# Patient Record
Sex: Female | Born: 1960
Health system: Southern US, Community
[De-identification: ages and names within clinical notes are randomized; demographics above are authoritative.]

## PROBLEM LIST (undated history)

## (undated) DIAGNOSIS — I739 Peripheral vascular disease, unspecified: Secondary | ICD-10-CM

## (undated) DIAGNOSIS — R0600 Dyspnea, unspecified: Secondary | ICD-10-CM

## (undated) DIAGNOSIS — K219 Gastro-esophageal reflux disease without esophagitis: Secondary | ICD-10-CM

## (undated) DIAGNOSIS — E785 Hyperlipidemia, unspecified: Secondary | ICD-10-CM

## (undated) DIAGNOSIS — I725 Aneurysm of other precerebral arteries: Secondary | ICD-10-CM

## (undated) DIAGNOSIS — J439 Emphysema, unspecified: Secondary | ICD-10-CM

## (undated) DIAGNOSIS — I10 Essential (primary) hypertension: Secondary | ICD-10-CM

## (undated) DIAGNOSIS — E041 Nontoxic single thyroid nodule: Secondary | ICD-10-CM

## (undated) DIAGNOSIS — F419 Anxiety disorder, unspecified: Secondary | ICD-10-CM

## (undated) HISTORY — DX: Peripheral vascular disease, unspecified: I73.9

## (undated) HISTORY — PX: NASAL SINUS SURGERY: SHX719

## (undated) HISTORY — DX: Nontoxic single thyroid nodule: E04.1

## (undated) HISTORY — DX: Emphysema, unspecified: J43.9

## (undated) HISTORY — PX: VASCULAR SURGERY: SHX849

## (undated) HISTORY — PX: TONSILLECTOMY: SUR1361

## (undated) HISTORY — PX: TUBAL LIGATION: SHX77

## (undated) HISTORY — DX: Essential (primary) hypertension: I10

---

## 2003-02-27 HISTORY — PX: ENDOMETRIAL BIOPSY: SHX622

## 2006-02-26 HISTORY — PX: FEMORAL ARTERY STENT: SHX1583

## 2006-09-25 DIAGNOSIS — I739 Peripheral vascular disease, unspecified: Secondary | ICD-10-CM | POA: Insufficient documentation

## 2006-09-25 DIAGNOSIS — K219 Gastro-esophageal reflux disease without esophagitis: Secondary | ICD-10-CM | POA: Insufficient documentation

## 2009-12-16 ENCOUNTER — Ambulatory Visit: Payer: Self-pay | Admitting: Emergency Medicine

## 2009-12-16 DIAGNOSIS — E785 Hyperlipidemia, unspecified: Secondary | ICD-10-CM | POA: Insufficient documentation

## 2010-03-28 NOTE — Assessment & Plan Note (Signed)
Summary: SORE THROAT,FEVER,EAR PAIN, COUGH/TJ   Vital Signs:  Patient Profile:   50 Years Old Female CC:      Cold & URI symptoms x 3 days Height:     65 inches Weight:      165 pounds O2 Sat:      98 % O2 treatment:    Room Air Temp:     98.5 degrees F oral Pulse rate:   86 / minute Resp:     14 per minute BP sitting:   138 / 84  (left arm) Cuff size:   regular  Vitals Entered By: Lajean Saver RN (December 16, 2009 4:22 PM)                  Updated Prior Medication List: LIPITOR 40 MG TABS (ATORVASTATIN CALCIUM) once daily OMEPRAZOLE 20 MG CPDR (OMEPRAZOLE) once daily METOPROLOL SUCCINATE 50 MG XR24H-TAB (METOPROLOL SUCCINATE) once daily  Current Allergies: ! * OLIVESHistory of Present Illness History from: patient Chief Complaint: Cold & URI symptoms x 3 days History of Present Illness: Patient complains of onset of cold symptoms for 3-4 days.  They have been using Zyrtec & Nyquil which is helping a little bit.  She was just on a trip to [facility]. + sore throat + cough No pleuritic pain No wheezing + nasal congestion + post-nasal drainage + sinus pain/pressure No itchy/red eyes + ear ringing No hemoptysis No SOB No chills/sweats No fever No nausea No vomiting No abdominal pain No diarrhea No skin rashes No fatigue No myalgias + headache   REVIEW OF SYSTEMS Constitutional Symptoms      Denies fever, chills, night sweats, weight loss, weight gain, and fatigue.  Eyes       Denies change in vision, eye pain, eye discharge, glasses, contact lenses, and eye surgery. Ear/Nose/Throat/Mouth       Complains of sore throat.      Denies hearing loss/aids, change in hearing, ear pain, ear discharge, dizziness, frequent runny nose, frequent nose bleeds, sinus problems, hoarseness, and tooth pain or bleeding.  Respiratory       Complains of productive cough.      Denies dry cough, wheezing, shortness of breath, asthma, bronchitis, and emphysema/COPD.    Cardiovascular       Denies murmurs, chest pain, and tires easily with exhertion.    Gastrointestinal       Complains of nausea/vomiting.      Denies stomach pain, diarrhea, constipation, blood in bowel movements, and indigestion. Genitourniary       Denies painful urination, kidney stones, and loss of urinary control. Neurological       Complains of headaches.      Denies paralysis, seizures, and fainting/blackouts. Musculoskeletal       Denies muscle pain, joint pain, joint stiffness, decreased range of motion, redness, swelling, muscle weakness, and gout.  Skin       Denies bruising, unusual mles/lumps or sores, and hair/skin or nail changes.  Psych       Denies mood changes, temper/anger issues, anxiety/stress, speech problems, depression, and sleep problems.  Past History:  Past Medical History: Hyperlipidemia PAD  Past Surgical History: Tonsillectomy Tubal ligation abdominal stents  Family History: PAD  Social History: Current Smoker Alcohol use-no Drug use-no Smoking Status:  current Drug Use:  no Physical Exam General appearance: well developed, well nourished, no acute distress Head: normocephalic, atraumatic Ears: normal, no lesions or deformities Nasal: clear discharge Oral/Pharynx: pharyngeal erythema without exudate, uvula midline without  deviation Chest/Lungs: no rales, wheezes, or rhonchi bilateral, breath sounds equal without effort Heart: regular rate and  rhythm, no murmur Skin: no obvious rashes or lesions MSE: oriented to time, place, and person Assessment New Problems: UPPER RESPIRATORY INFECTION, ACUTE (ICD-465.9) HYPERLIPIDEMIA (ICD-272.4)   Patient Education: Patient and/or caregiver instructed in the following: rest, fluids, Tylenol prn, Ibuprofen prn.  Plan New Medications/Changes: ZITHROMAX Z-PAK 250 MG TABS (AZITHROMYCIN) use as directed  #1 x 0, 12/16/2009, Hoyt Koch MD CHERATUSSIN AC 100-10 MG/5ML SYRP  (GUAIFENESIN-CODEINE) 5-10 cc by mouth Q6 hours as needed for cough  #6oz x 0, 12/16/2009, Hoyt Koch MD  New Orders: New Patient Level II [phone] Planning Comments:   Take Sudafed + cough meds + ZYrtec for a few days If getting worse symptoms, can start the antibiotic Follow-up with your primary care physician if not improving or if getting worse   The patient and/or caregiver has been counseled thoroughly with regard to medications prescribed including dosage, schedule, interactions, rationale for use, and possible side effects and they verbalize understanding.  Diagnoses and expected course of recovery discussed and will return if not improved as expected or if the condition worsens. Patient and/or caregiver verbalized understanding.  Prescriptions: ZITHROMAX Z-PAK 250 MG TABS (AZITHROMYCIN) use as directed  #1 x 0   Entered and Authorized by:   Hoyt Koch MD   Signed by:   Hoyt Koch MD on 12/16/2009   Method used:   Print then Give to Patient   RxID:   [id number] CHERATUSSIN AC 100-10 MG/5ML SYRP (GUAIFENESIN-CODEINE) 5-10 cc by mouth Q6 hours as needed for cough  #6oz x 0   Entered and Authorized by:   Hoyt Koch MD   Signed by:   Hoyt Koch MD on 12/16/2009   Method used:   Print then Give to Patient   RxID:   [id number]   Orders Added: 1)  New Patient Level II [01027]

## 2011-07-28 DIAGNOSIS — E785 Hyperlipidemia, unspecified: Secondary | ICD-10-CM | POA: Insufficient documentation

## 2011-07-28 DIAGNOSIS — K5900 Constipation, unspecified: Secondary | ICD-10-CM | POA: Insufficient documentation

## 2011-12-19 DIAGNOSIS — E042 Nontoxic multinodular goiter: Secondary | ICD-10-CM | POA: Insufficient documentation

## 2012-07-28 DIAGNOSIS — Z720 Tobacco use: Secondary | ICD-10-CM | POA: Insufficient documentation

## 2014-05-17 ENCOUNTER — Encounter: Payer: Self-pay | Admitting: Emergency Medicine

## 2014-05-17 ENCOUNTER — Emergency Department
Admission: EM | Admit: 2014-05-17 | Discharge: 2014-05-17 | Disposition: A | Discharge status: Home / Self Care | Payer: Federal, State, Local not specified - PPO | Source: Home / Self Care | Attending: Family Medicine | Admitting: Family Medicine

## 2014-05-17 DIAGNOSIS — J069 Acute upper respiratory infection, unspecified: Secondary | ICD-10-CM | POA: Diagnosis not present

## 2014-05-17 DIAGNOSIS — B9789 Other viral agents as the cause of diseases classified elsewhere: Principal | ICD-10-CM

## 2014-05-17 DIAGNOSIS — J9801 Acute bronchospasm: Secondary | ICD-10-CM | POA: Diagnosis not present

## 2014-05-17 HISTORY — DX: Gastro-esophageal reflux disease without esophagitis: K21.9

## 2014-05-17 HISTORY — DX: Hyperlipidemia, unspecified: E78.5

## 2014-05-17 MED ORDER — AZITHROMYCIN 250 MG PO TABS: ORAL_TABLET | ORAL | Status: DC

## 2014-05-17 MED ORDER — PREDNISONE 20 MG PO TABS: 20.0000 mg | ORAL_TABLET | Freq: Two times a day (BID) | ORAL | Status: DC

## 2014-05-17 MED ORDER — GUAIFENESIN-CODEINE 100-10 MG/5ML PO SOLN: ORAL | Status: DC

## 2014-05-17 NOTE — ED Provider Notes (Signed)
CSN: [id number]     Arrival date & time 05/17/14  1130 History   First MD Initiated Contact with Patient 05/17/14 1156     Chief Complaint  Patient presents with  . Cough     HPI Comments: Patient complains of five day history of typical cold-like symptoms including mild sore throat, sinus congestion, fatigue, and partly productive cough.  She has developed a sensation of tightness in her anterior chest, and wheezing and shortness of breath with activity.  Her cough is worse at night.  She continues to smoke.  She has a distant past history of pneumonia.  The history is provided by the patient.    Past Medical History  Diagnosis Date  . GERD (gastroesophageal reflux disease)   . Hyperlipidemia    Past Surgical History  Procedure Laterality Date  . Tonsillectomy    . Femoral stents     No family history on file. History  Substance Use Topics  . Smoking status: Current Every Day Smoker -- 1.00 packs/day for 37 years  . Smokeless tobacco: Not on file  . Alcohol Use: Yes   OB History    No data available     Review of Systems + sore throat + cough No pleuritic pain, but has tightness in anterior chest + wheezing + nasal congestion + post-nasal drainage No sinus pain/pressure No itchy/red eyes No earache No hemoptysis + SOB with activity No fever/chills No nausea No vomiting No abdominal pain No diarrhea No urinary symptoms No skin rash + fatigue No myalgias No headache Used OTC meds without relief  Allergies  Review of patient's allergies indicates not on file.  Home Medications   Prior to Admission medications   Medication Sig Start Date End Date Taking? Authorizing Provider  atorvastatin (LIPITOR) 10 MG tablet Take 10 mg by mouth daily.   Yes Historical Provider, MD  omeprazole (PRILOSEC) 10 MG capsule Take 10 mg by mouth daily.   Yes Historical Provider, MD  azithromycin (ZITHROMAX Z-PAK) 250 MG tablet Take 2 tabs today; then begin one tab once daily for  4 more days. 05/17/14   Lattie HawStephen A Beese, MD  guaiFENesin-codeine 100-10 MG/5ML syrup Take 10mL by mouth at bedtime as needed for cough 05/17/14   Lattie HawStephen A Beese, MD  predniSONE (DELTASONE) 20 MG tablet Take 1 tablet (20 mg total) by mouth 2 (two) times daily. Take with food. 05/17/14   Lattie HawStephen A Beese, MD   BP 139/78 mmHg  Pulse 89  Temp(Src) 97.7 F (36.5 C) (Oral)  Ht 5\' 5"  (1.651 m)  Wt 170 lb (77.111 kg)  BMI 28.29 kg/m2  SpO2 97% Physical Exam Nursing notes and Vital Signs reviewed. Appearance:  Patient appears stated age, and in no acute distress Eyes:  Pupils are equal, round, and reactive to light and accomodation.  Extraocular movement is intact.  Conjunctivae are not inflamed  Ears:  Canals normal.  Tympanic membranes normal.  Nose:  Congested turbinates.  No sinus tenderness.   Pharynx:  Normal Neck:  Supple.  Tender enlarged posterior nodes are palpated bilaterally  Lungs:   Distant breath sounds posteriorly.  Breath sounds are equal.  Chest:  Distinct tenderness to palpation over the mid-sternum.  Heart:  Regular rate and rhythm without murmurs, rubs, or gallops.  Abdomen:  Nontender without masses or hepatosplenomegaly.  Bowel sounds are present.  No CVA or flank tenderness.  Extremities:  No edema.  No calf tenderness Skin:  No rash present.    ED Course  Procedures  none   MDM   1. Viral URI with cough   2. Bronchospasm     Begin Z-pack for atypical coverage, and prednisone burst.  Robitussin AC for night cough. Take plain guaifenesin (  extended release tabs such as Mucinex) twice daily, with plenty of water, for cough and congestion.  May add Pseudoephedrine ( , one or two every 4 to 6 hours) for sinus congestion.  Get adequate rest.   May use Afrin nasal spray (or generic oxymetazoline) twice daily for about 5 days.  Also recommend using saline nasal spray several times daily and saline nasal irrigation (AYR is a common brand).   Try warm salt water  gargles for sore throat.  Stop all antihistamines for now, and other non-prescription cough/cold preparations. May take Ibuprofen , 4 tabs every 8 hours with food for chest/sternum discomfort.   Follow-up with family doctor if not improving about10 days.    Lattie Haw, MD 05/17/14 1228

## 2014-05-17 NOTE — Discharge Instructions (Signed)
Take plain guaifenesin (1200mg  extended release tabs such as Mucinex) twice daily, with plenty of water, for cough and congestion.  May add Pseudoephedrine (30mg , one or two every 4 to 6 hours) for sinus congestion.  Get adequate rest.   May use Afrin nasal spray (or generic oxymetazoline) twice daily for about 5 days.  Also recommend using saline nasal spray several times daily and saline nasal irrigation (AYR is a common brand).   Try warm salt water gargles for sore throat.  Stop all antihistamines for now, and other non-prescription cough/cold preparations. May take Ibuprofen 200mg , 4 tabs every 8 hours with food for chest/sternum discomfort.   Follow-up with family doctor if not improving about10 days.

## 2014-05-17 NOTE — ED Notes (Signed)
Productive cough x 2 days, chest burns

## 2015-03-18 ENCOUNTER — Ambulatory Visit (INDEPENDENT_AMBULATORY_CARE_PROVIDER_SITE_OTHER): Payer: Federal, State, Local not specified - PPO | Admitting: Osteopathic Medicine

## 2015-03-18 ENCOUNTER — Other Ambulatory Visit: Payer: Self-pay

## 2015-03-18 ENCOUNTER — Ambulatory Visit (INDEPENDENT_AMBULATORY_CARE_PROVIDER_SITE_OTHER): Payer: Federal, State, Local not specified - PPO

## 2015-03-18 VITALS — BP 148/71 | HR 90 | Ht 65.0 in | Wt 168.0 lb

## 2015-03-18 DIAGNOSIS — M79641 Pain in right hand: Secondary | ICD-10-CM

## 2015-03-18 DIAGNOSIS — K5900 Constipation, unspecified: Secondary | ICD-10-CM

## 2015-03-18 DIAGNOSIS — Z780 Asymptomatic menopausal state: Secondary | ICD-10-CM

## 2015-03-18 DIAGNOSIS — R5382 Chronic fatigue, unspecified: Secondary | ICD-10-CM

## 2015-03-18 DIAGNOSIS — K219 Gastro-esophageal reflux disease without esophagitis: Secondary | ICD-10-CM | POA: Diagnosis not present

## 2015-03-18 DIAGNOSIS — Z139 Encounter for screening, unspecified: Secondary | ICD-10-CM

## 2015-03-18 DIAGNOSIS — Z8709 Personal history of other diseases of the respiratory system: Secondary | ICD-10-CM

## 2015-03-18 DIAGNOSIS — M6530 Trigger finger, unspecified finger: Secondary | ICD-10-CM

## 2015-03-18 DIAGNOSIS — F17200 Nicotine dependence, unspecified, uncomplicated: Secondary | ICD-10-CM

## 2015-03-18 DIAGNOSIS — Z8639 Personal history of other endocrine, nutritional and metabolic disease: Secondary | ICD-10-CM

## 2015-03-18 DIAGNOSIS — Z8719 Personal history of other diseases of the digestive system: Secondary | ICD-10-CM

## 2015-03-18 DIAGNOSIS — Z79899 Other long term (current) drug therapy: Secondary | ICD-10-CM

## 2015-03-18 DIAGNOSIS — R140 Abdominal distension (gaseous): Secondary | ICD-10-CM | POA: Diagnosis not present

## 2015-03-18 DIAGNOSIS — R531 Weakness: Secondary | ICD-10-CM

## 2015-03-18 LAB — COMPLETE METABOLIC PANEL WITH GFR
ALBUMIN: 4 g/dL (ref 3.6–5.1)
ALK PHOS: 98 U/L (ref 33–130)
ALT: 14 U/L (ref 6–29)
AST: 18 U/L (ref 10–35)
BUN: 13 mg/dL (ref 7–25)
CHLORIDE: 104 mmol/L (ref 98–110)
CO2: 22 mmol/L (ref 20–31)
CREATININE: 0.72 mg/dL (ref 0.50–1.05)
Calcium: 9.1 mg/dL (ref 8.6–10.4)
GFR, Est Non African American: >89 mL/min (ref >=60)
Glucose, Bld: 91 mg/dL (ref 65–99)
POTASSIUM: 4.3 mmol/L (ref 3.5–5.3)
Sodium: 140 mmol/L (ref 135–146)
Total Bilirubin: 0.4 mg/dL (ref 0.2–1.2)
Total Protein: 6.3 g/dL (ref 6.1–8.1)

## 2015-03-18 LAB — CBC WITH DIFFERENTIAL/PLATELET
BASOS PCT: 0 % (ref 0–1)
Basophils Absolute: 0 10*3/uL (ref 0.0–0.1)
Eosinophils Absolute: 0.2 10*3/uL (ref 0.0–0.7)
Eosinophils Relative: 3 % (ref 0–5)
HCT: 43 % (ref 36.0–46.0)
Hemoglobin: 14.3 g/dL (ref 12.0–15.0)
Lymphocytes Relative: 27 % (ref 12–46)
Lymphs Abs: 1.9 10*3/uL (ref 0.7–4.0)
MCH: 29.5 pg (ref 26.0–34.0)
MCHC: 33.3 g/dL (ref 30.0–36.0)
MCV: 88.8 fL (ref 78.0–100.0)
MONO ABS: 0.4 10*3/uL (ref 0.1–1.0)
MPV: 8.8 fL (ref 8.6–12.4)
Monocytes Relative: 6 % (ref 3–12)
NEUTROS ABS: 4.4 10*3/uL (ref 1.7–7.7)
NEUTROS PCT: 64 % (ref 43–77)
Platelets: 329 10*3/uL (ref 150–400)
RBC: 4.84 MIL/uL (ref 3.87–5.11)
RDW: 13.8 % (ref 11.5–15.5)
WBC: 6.9 10*3/uL (ref 4.0–10.5)

## 2015-03-18 LAB — RHEUMATOID FACTOR: Rhuematoid fact SerPl-aCnc: <10 IU/mL (ref <=14)

## 2015-03-18 LAB — LIPID PANEL
CHOLESTEROL: 185 mg/dL (ref 125–200)
HDL: 36 mg/dL — ABNORMAL LOW (ref >=46)
LDL Cholesterol: 100 mg/dL (ref <130)
TRIGLYCERIDES: 244 mg/dL — AB (ref <150)
Total CHOL/HDL Ratio: 5.1 Ratio — ABNORMAL HIGH (ref <=5.0)
VLDL: 49 mg/dL — ABNORMAL HIGH (ref <30)

## 2015-03-18 LAB — CK: Total CK: 76 U/L (ref 7–177)

## 2015-03-18 LAB — TSH: TSH: 1.298 u[IU]/mL (ref 0.350–4.500)

## 2015-03-18 NOTE — Progress Notes (Signed)
HPI: Tina Navarro is a 55 y.o. female who presents to [facility] today for chief complaint of:  Chief Complaint  Patient presents with  . Establish Care    Patient concerned about "problems that other doctors have caused"    Tina Navarro is a new patient here to establish care. She reports frustration with previous physicians, her greatest frustration seems to the having been on multiple medications which she wasn't clear on why she needed, also not clear on diagnoses of COPD versus bronchitis. She has also been following with GI for chronic constipation problems and GERD. She reports occasional trouble breathing when she feels particularly bloated, no chest pain or dizziness. She reports generalized muscle weakness and joint pain. Her greatest musculoskeletal complaint is pain in the right thumb joint and locking of the right thumb particularly upon waking in the morning. No other numbness or tingling in the hands. No injury to the hand or wrists. Patient is right-handed   FEMORAL STENTS: pt reports no clear diagnosis for why she had to have these but states that this issue did run in her family.  HLD: On Lipitor  GERD: On Omeprazole daily, EGD 2 years ago no concerning findings   CONSTIPATION: Taking Senna-S daily, only helps somewhat. Colonoscopy done about 3 - 4 years aog, no ocncerning findings, was diagnosed with IBS ([facility] in [place]).  Was told follow up in 10 years for colonoscopy. Also was on Dicyclomine and Promethazine as needed.   COPD: Was seen by Pulmonology for "chronic brinchitis." Sometimes, breathing ok, sometimes not. "When my stomach swells up it hurts to breathe. Was Rx Breo and Combivent, but never filled these. Also went for sleeping test.   THYROID: History of thyroid nodules, has never had to be on medication, reports biopsies in the past were negative..    Past medical, social and family  history reviewed: Past Medical History  Diagnosis Date  . GERD (gastroesophageal reflux disease)   . Hyperlipidemia    Past Surgical History  Procedure Laterality Date  . Tonsillectomy    . Femoral stents    . Tubal ligation    . Nasal sinus surgery    . Endometrial biopsy  2005   Social History  Substance Use Topics  . Smoking status: Current Every Day Smoker -- 1.00 packs/day for 35 years  . Smokeless tobacco: Not on file  . Alcohol Use: No   Family History  Problem Relation Age of Onset  . Hypertension Mother   . Hypertension Father   . Heart disease Father   . Peripheral vascular disease Father   . Alcohol abuse Father   . Cancer Maternal Grandmother     BREAST  . Stroke Maternal Grandmother     CEBERAL ATHEROSCLEROSIS  . Cancer - Ovarian Cousin   . Fibroids Sister     Current Outpatient Prescriptions  Medication Sig Dispense Refill  . Aspirin-Salicylamide-Caffeine (BC HEADACHE POWDER PO) Take by mouth as needed.    Marland Kitchen atorvastatin (LIPITOR) 40 MG tablet Take 40 mg by mouth.    . COMBIVENT RESPIMAT 20-100 MCG/ACT AERS respimat inhale 1 puff by mouth every 6 hours  1  . omeprazole (PRILOSEC) 40 MG capsule take 1 capsule by mouth once daily 1/2 HOUR BEFORE BREAKFAST  0  . senna (SENOKOT) 8.6 MG tablet Take 2 tablets by mouth daily.    Marland Kitchen aspirin 325 MG tablet Take 325 mg by mouth. Reported on 03/18/2015    .  Cholecalciferol (VITAMIN D3) 5000 units TABS Take by mouth.    . dicyclomine (BENTYL) 20 MG tablet Take 20 mg by mouth every 6 (six) hours. Reported on 03/18/2015    . promethazine (PHENERGAN) 12.5 MG tablet Take 12.5 mg by mouth every 6 (six) hours as needed for nausea or vomiting. Reported on 03/18/2015     No current facility-administered medications for this visit.   Allergies  Allergen Reactions  . Doxycycline       Review of Systems: CONSTITUTIONAL:  No  fever, no chills, (+) unintentional weight changes - gain HEAD/EYES/EARS/NOSE/THROAT: No  headache,  no vision change, no hearing change, No  sore throat, No  sinus pressure CARDIAC: No  chest pain, No  pressure, No palpitations, No  orthopnea RESPIRATORY: No  cough, No  shortness of breath/wheeze GASTROINTESTINAL: No  nausea, No  vomiting, (+) normalized intermittent abdominal pain, No  blood in stool, No  diarrhea, (+) chronic constipation  MUSCULOSKELETAL: (+) myalgia/arthralgia GENITOURINARY: No  incontinence, No  abnormal genital bleeding/discharge SKIN: No  rash/wounds/concerning lesions HEM/ONC: No  easy bruising/bleeding, No  abnormal lymph node ENDOCRINE: No polyuria/polydipsia/polyphagia, No  heat/cold intolerance  NEUROLOGIC: (+) Generalized weakness, No  dizziness, No  slurred speech PSYCHIATRIC: No  concerns with depression, No  concerns with anxiety, No sleep problems  Exam:  BP 148/71 mmHg  Pulse 90  Ht  (1.651 m)  Wt 168 lb (76.204 kg)  BMI 27.96 kg/m2 Constitutional: VS see above. General Appearance: alert, well-developed, well-nourished, NAD Eyes: Normal lids and conjunctive, non-icteric sclera, PERRLA Ears, Nose, Mouth, Throat: MMM, Normal external inspection ears/nares/mouth/lips/gums, TM normal bilaterally, external and now on left mild irritation consistent with overuse of Q-tips or other trauma.. Pharynx no erythema, no exudate.  Neck: No masses, trachea midline. No thyroid enlargement/tenderness/mass appreciated. No lymphadenopathy Respiratory: Normal respiratory effort. no wheeze, no rhonchi, no rales Cardiovascular: S1/S2 normal, no murmur, no rub/gallop auscultated. RRR. No carotid bruit or JVD. No abdominal aortic bruit. Pedal pulse II/IV bilaterally DP and PT. No lower extremity edema. Gastrointestinal: Diffusely tender, no masses. No hepatomegaly, no splenomegaly. No hernia appreciated. Bowel sounds normal. Rectal exam deferred. No guarding or rebound Musculoskeletal: Gait normal. No clubbing/cyanosis of digits. Locking of distal interphalangeal joint of  first digit of right hand, negative Tinel's and Phalen's, normal range of motion, or joint effusion or swelling. Neurological: No cranial nerve deficit on limited exam. Motor and sensation intact and symmetric Skin: warm, dry, intact. No rash/ulcer. No concerning nevi or subq nodules on limited exam.   Psychiatric: Normal judgment/insight. Depressed mood and affect. Oriented x3. Patient becomes tearful on exam and interview.   No results found for this or any previous visit (from the past 72 hour(s)).    ASSESSMENT/PLAN: Patient clearly frustrated with previous medical care she receives. Patient becomes tearful on exam and interview, when I inquire further about depression issues she states that she is just tired of going through all this with her doctors and wants to feel better, wants to know why she is constantly tired and generally weak. I explained we will start with basic labs as below, will also recommend x-ray of hands, patient was instructed on use of tape/Band-Aids to splint the trigger finger, will also refer to hand surgery/orthopedics to further evaluate and discuss surgical options as the patient wants this fixed. I advised patient that at her follow-up visit, we can discuss either her breathing issues or her abdominal pain. The patient reports that she previously has had pulmonary  function tests done, I advised that this is reasonable test to diagnose COPD and we can repeat this and I will go over results with her in detail if she would like. She states that her abdominal pain is bad thing that she would like to address first and I agreed that we can spend next visit addressing this issue fully. Instructions were printed with the patient, specific goals were set for her next visit.  Chronic fatigue - Plan: CBC with Differential/Platelet, COMPLETE METABOLIC PANEL WITH GFR, TSH, VITAMIN D 25 Hydroxy (Vit-D Deficiency, Fractures), CK, Rheumatoid factor, Sedimentation rate, High sensitivity  CRP, Antinuclear Antib (ANA), CANCELED: ANA  Constipation, unspecified constipation type - patient reports history of possible irritable bowel syndrome, will address this with her abdominal pain further at next visit, labs as above  Bloating   Gastroesophageal reflux disease, esophagitis presence not specified  Tobacco dependence   History of hyperlipidemia - Plan: Lipid panel  Medication management - Plan: CBC with Differential/Platelet, COMPLETE METABOLIC PANEL WITH GFR  History of COPD - consider repeat pulmonary function tests  History of irritable bowel syndrome  Trigger finger - Plan: Ambulatory referral to Hand Surgery  Hand joint pain, right - Plan: DG Hand Complete Right, Ambulatory referral to Hand Surgery, she given instructions to pick up Tee [employer]ee splint to stabilize thumb joint, we do not have any available in the office however the should be available at most medical supply stores  Generalized weakness - Plan: CK, Rheumatoid factor, Sedimentation rate, High sensitivity CRP, Antinuclear Antib (ANA), CANCELED: ANA  Postmenopausal - Plan: VITAMIN D 25 Hydroxy (Vit-D Deficiency, Fractures)  Screening - Plan: Lipid panel, HIV antibody, Hepatitis C antibody   Return in about 2 weeks (around 04/01/2015) for ADDRES BREATHING OR ABDOMINAL ISSUES.  Total time spent 60 minutes, greater than 50% of the visit was counseling and coordinating care for diagnosis of multiple medical problems listed above.

## 2015-03-18 NOTE — Patient Instructions (Signed)
Plan for today: We are getting some basic blood work to work up for any serious causes of fatigue or weakness. We are also getting some general screening labs for things like cholesterol and diabetes which should be done at least once per year in anyone your age. We will also get an x-ray of the hand and place a referral to orthopedics to talk more about options for management of trigger finger and what I suspect is arthritis in the thumb joint. We will recommend a thumb brace today to use at night and when the hand is really bothering you, keeping the joint immobile will help to reduce inflammation and pain. If everything is looking okay on labs, I would also probably recommend a few visits with physical therapy.   Plan for the future: First we have to make sure that nothing serious is going on with your blood work or on your x-ray. To further address your breathing issues, we can repeat the lung function tests and go over the results together see if he really needs to be on inhalers to help with breathing. To further address your issues with abdominal pain and bloating and constipation, let's plan to get records from digestive health to see what has been done or tried already, and make sure everything is looking okay on the blood work. So at your next visit, let's plan to either really address lung function or abdominal pain, which ever you would prefer to talk about. At some point this year, we should do a preventative care/wellness visit to address screening tests such as Pap smears & mammograms, and update immunizations. This can wait until some of your more urgent issues are addressed.  You can expect to get a call about your blood work and x-ray results sometime early next week, please let us know if you do not hear back about these things.   Please let us know if there is anything else we can do to help you!

## 2015-03-19 LAB — HEPATITIS C ANTIBODY: HCV Ab: NEGATIVE

## 2015-03-19 LAB — VITAMIN D 25 HYDROXY (VIT D DEFICIENCY, FRACTURES): Vit D, 25-Hydroxy: 27 ng/mL — ABNORMAL LOW (ref 30–100)

## 2015-03-19 LAB — HIV ANTIBODY (ROUTINE TESTING W REFLEX): HIV: NONREACTIVE

## 2015-03-19 LAB — HIGH SENSITIVITY CRP: CRP HIGH SENSITIVITY: 6 mg/L — AB

## 2015-03-19 LAB — SEDIMENTATION RATE: Sed Rate: 5 mm/hr (ref 0–30)

## 2015-03-21 LAB — ANA: Anti Nuclear Antibody(ANA): NEGATIVE

## 2015-04-01 ENCOUNTER — Ambulatory Visit (INDEPENDENT_AMBULATORY_CARE_PROVIDER_SITE_OTHER): Payer: Federal, State, Local not specified - PPO | Admitting: Osteopathic Medicine

## 2015-04-01 ENCOUNTER — Encounter: Payer: Self-pay | Admitting: Osteopathic Medicine

## 2015-04-01 VITALS — BP 146/82 | HR 83 | Ht 65.0 in | Wt 167.0 lb

## 2015-04-01 DIAGNOSIS — E785 Hyperlipidemia, unspecified: Secondary | ICD-10-CM | POA: Insufficient documentation

## 2015-04-01 DIAGNOSIS — K588 Other irritable bowel syndrome: Secondary | ICD-10-CM

## 2015-04-01 DIAGNOSIS — F439 Reaction to severe stress, unspecified: Secondary | ICD-10-CM

## 2015-04-01 DIAGNOSIS — K589 Irritable bowel syndrome without diarrhea: Secondary | ICD-10-CM | POA: Insufficient documentation

## 2015-04-01 DIAGNOSIS — F17201 Nicotine dependence, unspecified, in remission: Secondary | ICD-10-CM | POA: Diagnosis not present

## 2015-04-01 DIAGNOSIS — Z658 Other specified problems related to psychosocial circumstances: Secondary | ICD-10-CM | POA: Diagnosis not present

## 2015-04-01 MED ORDER — ATORVASTATIN CALCIUM 40 MG PO TABS: 40.0000 mg | ORAL_TABLET | Freq: Every day | ORAL | Status: DC

## 2015-04-01 MED ORDER — DICYCLOMINE HCL 20 MG PO TABS: 20.0000 mg | ORAL_TABLET | Freq: Four times a day (QID) | ORAL | Status: DC

## 2015-04-01 MED ORDER — BISACODYL 5 MG PO TBEC: 5.0000 mg | DELAYED_RELEASE_TABLET | Freq: Every day | ORAL | Status: DC | PRN

## 2015-04-01 MED ORDER — PROMETHAZINE HCL 12.5 MG PO TABS: 12.5000 mg | ORAL_TABLET | Freq: Four times a day (QID) | ORAL | Status: DC | PRN

## 2015-04-01 NOTE — Progress Notes (Signed)
HPI: Tina Navarro is a 55 y.o. female who presents to [facility] today for chief complaint of:  Chief Complaint  Patient presents with  . Follow-up    Gastrointestinal  . Location: Diffuse, worse in epigastrium. . Quality: Feels full, hurts mainly in epigastrium and pushes to hurts to breathe. Happy with Dr Ian Malkin at [facility]. . Context: Been scoped (EGD), had some kind of imaging test for gallbladder  . Modifying factors: Treated with Bentyl and Phenergan. Takes as needed. Hasn't talked about changing diet. Has tried to think of certain foods which cause more problems that doesn't notice any pattern in terms of what she eats causing her symptoms.. On Prilosec and does take this daily. Has been on Miralax in the past and didn't like this. Has been taking senna daily for some time and cannot have a bowel movement without using this medicine. . Assoc signs/symptoms: Stress, feeling better since changing jobs, feels a bit better since trying to reduce stress. Constipated.   Stress . Modifying factors: has been on anti-anxiety/stress medications without tolerating these very well. Feels fuzzy and didn't like that.  . Assoc signs/symptoms: GI symptoms as noted above.       Past medical, social and family history reviewed: Past Medical History  Diagnosis Date  . GERD (gastroesophageal reflux disease)   . Hyperlipidemia    Past Surgical History  Procedure Laterality Date  . Tonsillectomy    . Femoral stents    . Tubal ligation    . Nasal sinus surgery    . Endometrial biopsy  2005   Social History  Substance Use Topics  . Smoking status: Current Every Day Smoker -- 1.00 packs/day for 35 years  . Smokeless tobacco: Not on file  . Alcohol Use: No   Family History  Problem Relation Age of Onset  . Hypertension Mother   . Hypertension Father   . Heart disease Father   . Peripheral vascular disease Father   . Alcohol abuse Father    . Cancer Maternal Grandmother     BREAST  . Stroke Maternal Grandmother     CEBERAL ATHEROSCLEROSIS  . Cancer - Ovarian Cousin   . Fibroids Sister     Current Outpatient Prescriptions  Medication Sig Dispense Refill  . aspirin 325 MG tablet Take 325 mg by mouth. Reported on 03/18/2015    . Aspirin-Salicylamide-Caffeine (BC HEADACHE POWDER PO) Take by mouth as needed.    Marland Kitchen atorvastatin (LIPITOR) 40 MG tablet Take 40 mg by mouth.    . Cholecalciferol (VITAMIN D3) 5000 units TABS Take by mouth. Reported on 03/18/2015    . COMBIVENT RESPIMAT 20-100 MCG/ACT AERS respimat inhale 1 puff by mouth every 6 hours  1  . dicyclomine (BENTYL) 20 MG tablet Take 20 mg by mouth every 6 (six) hours. Reported on 03/18/2015    . omeprazole (PRILOSEC) 40 MG capsule take 1 capsule by mouth once daily 1/2 HOUR BEFORE BREAKFAST  0  . promethazine (PHENERGAN) 12.5 MG tablet Take 12.5 mg by mouth every 6 (six) hours as needed for nausea or vomiting. Reported on 03/18/2015    . senna (SENOKOT) 8.6 MG tablet Take 2 tablets by mouth daily.     No current facility-administered medications for this visit.   Allergies  Allergen Reactions  . Doxycycline       Review of Systems: CONSTITUTIONAL:  No  fever, no chills, No  unintentional weight changes CARDIAC: No  chest pain RESPIRATORY: No  cough, No  shortness of breath/wheeze, (+) occasional feeling like she can't take a deep breath when her abdomen is significantly bloated. GASTROINTESTINAL: (+)nausea, No  vomiting, (+)abdominal pain, No  blood in stool, No  diarrhea, (+)constipation  MUSCULOSKELETAL: (+)myalgia/arthralgia ENDOCRINE: No polyuria/polydipsia/polyphagia, No  heat/cold intolerance  PSYCHIATRIC: No  concerns with depression, (+)concerns with anxiety, No sleep problems  Exam:  BP 146/82 mmHg  Pulse 83  Ht  (1.651 m)  Wt 167 lb (75.751 kg)  BMI 27.79 kg/m2 Constitutional: VS see above. General Appearance: alert, well-developed, well-nourished,  NAD Eyes: Normal lids and conjunctive, non-icteric sclera,  Ears, Nose, Mouth, Throat: MMM, Normal external inspection ears/nares/mouth/lips/gums,  Respiratory: Normal respiratory effort. Psychiatric: Normal judgment/insight. Normal mood and affect. Oriented x3.     ASSESSMENT/PLAN: We'll refill Phenergan and Bentyl, advised patient wean off of senna and will try bisacodyl daily as an alternative. Patient was given low FODMAP diet information, can try increasing fiber although this might exacerbate bloating. Advised patient that she does have symptoms consistent with irritable bowel, particularly given her stressors, she is reluctant to start any treatment for anxiety/depression but is aware of her options in terms of medication for this which may also help her GI symptoms. Patient declines referral for counseling.   Other irritable bowel syndrome - Plan: promethazine (PHENERGAN) 12.5 MG tablet, dicyclomine (BENTYL) 20 MG tablet, bisacodyl (BISACODYL LAXATIVE) 5 MG EC tablet  Hyperlipidemia - Plan: atorvastatin (LIPITOR) 40 MG tablet  Stress - Questionable diagnosis of generalized anxiety disorder or major depression, patient not interested in counseling or medication at this time  Tobacco abuse, in remission - Patient and her husband are quitting smoking, successful over the past 5 days, using nicotine patches, good work was encouraged  Return in about 4 weeks (around 04/29/2015) for RECHECK GI SYMPTOMS, TALK MORE ABOUT BREATHING IF NEEDED.  Total time spent 25 minutes, greater than 50% of the visit was counseling and coordinating care for diagnosis of irritable bowel syndrome, stress/anxiety.Marland Kitchen

## 2015-04-01 NOTE — Patient Instructions (Signed)
Bisacodyl tablets and capsules What is this medicine? BISACODYL (bis a KOE dill) is a laxative. This medicine is used to relieve constipation. It may also be used to empty and prepare the bowel for surgery or examination. This medicine may be used for other purposes; ask your health care provider or pharmacist if you have questions. What should I tell my health care provider before I take this medicine? They need to know if you have any of these conditions -appendicitis -persistent constipation -stomach pain or blockage -ulcerative colitis or other bowel disease -an unusual or allergic reaction to bisacodyl, other medicines, foods, dyes, or preservatives -pregnant or trying to get pregnant How should I use this medicine? Take this medicine by mouth with a glass of water. Follow the directions on the prescription label. Swallow the tablets whole. Do not crush or chew. Do not take this medicine more often than directed. Talk to your pediatrician regarding the use of this medicine in children. While this medicine may be used in children as young as 6 years for selected conditions, precautions do apply. Overdosage: If you think you have taken too much of this medicine contact a poison control center or emergency room at once. NOTE: This medicine is only for you. Do not share this medicine with others. What if I miss a dose? This does not apply. This medicine is not for regular use, and should only be used as needed. What may interact with this medicine? -antacids -h2-blockers like cimetidine, famotidine, nizatidine, and ranitidine -proton pump inhibitors like esomeprazole, omeprazole, pantoprazole, and rabeprazole This list may not describe all possible interactions. Give your health care provider a list of all the medicines, herbs, non-prescription drugs, or dietary supplements you use. Also tell them if you smoke, drink alcohol, or use illegal drugs. Some items may interact with your  medicine. What should I watch for while using this medicine? Do not use this medicine for longer than directed by your doctor or health care professional. This medicine can be habit-forming. Long-term use can make your body depend on the laxative for regular bowel movements, damage the bowel, cause malnutrition, and problems with the amounts of water and salts in your body. If your constipation keeps returning, check with your doctor or health care professional. Do not take this medicine within 1 hour of taking antacids or eating dairy products like milk or yogurt. These items can destroy the protective coating on the tablets and increase stomach upset and cramps. If you do not have a bowel movement within 12 hours after using this medicine or you experience rectal bleeding, contact your doctor or health care professional. These may be signs of a more serious condition. What side effects may I notice from receiving this medicine? Side effects that you should report to your doctor or health care professional as soon as possible: -diarrhea -muscle weakness -nausea, vomiting -unusual weight loss Side effects that usually do not require medical attention (report to your doctor or health care professional if they continue or are bothersome): -bloating -discolored urine -lower stomach discomfort or cramps -rectal itching, burning, or swelling This list may not describe all possible side effects. Call your doctor for medical advice about side effects. You may report side effects to FDA at 1-800-FDA-1088. Where should I keep my medicine? Keep out of the reach of children. Store at room temperature below 25 degrees C (77 degrees F). Protect from moisture. Throw away any unused medicine after the expiration date. NOTE: This sheet is a summary.  It may not cover all possible information. If you have questions about this medicine, talk to your doctor, pharmacist, or health care provider.    2016,  Elsevier/Gold Standard. (2007-05-15 12:55:59)

## 2015-04-22 ENCOUNTER — Encounter: Payer: Self-pay | Admitting: Osteopathic Medicine

## 2015-04-22 ENCOUNTER — Ambulatory Visit (INDEPENDENT_AMBULATORY_CARE_PROVIDER_SITE_OTHER): Payer: Federal, State, Local not specified - PPO | Admitting: Osteopathic Medicine

## 2015-04-22 VITALS — BP 168/94 | HR 94 | Ht 65.0 in | Wt 171.0 lb

## 2015-04-22 DIAGNOSIS — K588 Other irritable bowel syndrome: Secondary | ICD-10-CM | POA: Diagnosis not present

## 2015-04-22 DIAGNOSIS — F411 Generalized anxiety disorder: Secondary | ICD-10-CM | POA: Diagnosis not present

## 2015-04-22 DIAGNOSIS — G4700 Insomnia, unspecified: Secondary | ICD-10-CM

## 2015-04-22 MED ORDER — BUSPIRONE HCL 15 MG PO TABS
15.0000 mg | ORAL_TABLET | Freq: Three times a day (TID) | ORAL | Status: DC | PRN
Start: 2015-04-22 — End: 2016-04-03

## 2015-04-22 MED ORDER — AMITRIPTYLINE HCL 50 MG PO TABS: 25.0000 mg | ORAL_TABLET | Freq: Every evening | ORAL | Status: DC | PRN

## 2015-04-22 MED ORDER — LINACLOTIDE 145 MCG PO CAPS: 145.0000 ug | ORAL_CAPSULE | Freq: Every day | ORAL | Status: DC

## 2015-04-22 MED ORDER — DICYCLOMINE HCL 20 MG PO TABS: 20.0000 mg | ORAL_TABLET | Freq: Four times a day (QID) | ORAL | Status: DC

## 2015-04-22 NOTE — Progress Notes (Signed)
HPI: Tina Navarro is a 55 y.o. female who presents to [facility] today for chief complaint of:  Chief Complaint  Patient presents with  . Anxiety    STRESS/ANXIETY . Quality: Trouble coping with stress, situational factors such as serious problems at work with other employees being fired in her workload has increased, recent death in the family and subsequent family arguments over who is going to be caretaker of other incident uncles, custody over children is involved, patient is not directly involved in most of this however she is trying to coordinate these issues among different family members, will be traveling to [place] in the near future for funeral. Patient is also quitting smoking, hasn't had a cigarette throughout this whole time! . Duration: worse over last 10 days or so . Modifying factors: Nicotine patches, fighting urges, had patch off and felt very agitated and moody, is doing better with it on. Unable to tolerate Wellbutrin and Celexa in the past, also was on Chantix and didn't do well.  . Assoc signs/symptoms: Irritability and moodiness, nicotine withdrawal, insomnia    Past medical, social and family history reviewed: Past Medical History  Diagnosis Date  . GERD (gastroesophageal reflux disease)   . Hyperlipidemia    Past Surgical History  Procedure Laterality Date  . Tonsillectomy    . Femoral stents    . Tubal ligation    . Nasal sinus surgery    . Endometrial biopsy  2005   Social History  Substance Use Topics  . Smoking status: Current Every Day Smoker -- 1.00 packs/day for 35 years  . Smokeless tobacco: Not on file  . Alcohol Use: No   Family History  Problem Relation Age of Onset  . Hypertension Mother   . Hypertension Father   . Heart disease Father   . Peripheral vascular disease Father   . Alcohol abuse Father   . Cancer Maternal Grandmother     BREAST  . Stroke Maternal Grandmother     CEBERAL  ATHEROSCLEROSIS  . Cancer - Ovarian Cousin   . Fibroids Sister     Current Outpatient Prescriptions  Medication Sig Dispense Refill  . aspirin 325 MG tablet Take 325 mg by mouth. Reported on 03/18/2015    . Aspirin-Salicylamide-Caffeine (BC HEADACHE POWDER PO) Take by mouth as needed.    Marland Kitchen atorvastatin (LIPITOR) 40 MG tablet Take 1 tablet (40 mg total) by mouth daily. 90 tablet 2  . bisacodyl (BISACODYL LAXATIVE) 5 MG EC tablet Take 1-2 tablets (5-10 mg total) by mouth daily as needed for moderate constipation. 20 tablet 0  . Cholecalciferol (VITAMIN D3) 5000 units TABS Take by mouth. Reported on 03/18/2015    . COMBIVENT RESPIMAT 20-100 MCG/ACT AERS respimat inhale 1 puff by mouth every 6 hours  1  . omeprazole (PRILOSEC) 40 MG capsule take 1 capsule by mouth once daily 1/2 HOUR BEFORE BREAKFAST  0  . promethazine (PHENERGAN) 12.5 MG tablet Take 1 tablet (12.5 mg total) by mouth every 6 (six) hours as needed for nausea or vomiting. Reported on 03/18/2015 30 tablet 2  . senna (SENOKOT) 8.6 MG tablet Take 2 tablets by mouth daily.     No current facility-administered medications for this visit.   Allergies  Allergen Reactions  . Doxycycline       Review of Systems: CONSTITUTIONAL:  No  fever, no chills, No  unintentional weight changes CARDIAC: No  chest pain, No  pressure, No palpitations, No  orthopnea RESPIRATORY:  No  cough, No  shortness of breath/wheeze GASTROINTESTINAL: No  nausea, No  vomiting, No  abdominal pain, No  blood in stool, No  diarrhea, (+)constipation  NEUROLOGIC: No  weakness, No  dizziness, No  slurred speech PSYCHIATRIC: No  concerns with depression, (+)concerns with anxiety, (+)sleep problems  Exam:  BP 168/94 mmHg  Pulse 94  Ht  (1.651 m)  Wt 171 lb (77.565 kg)  BMI 28.46 kg/m2 Constitutional: VS see above. General Appearance: alert, well-developed, well-nourished, NAD Eyes: Normal lids and conjunctive, non-icteric sclera, Respiratory: Normal  respiratory effort. no wheeze, no rhonchi, no rales Cardiovascular: S1/S2 normal, no murmur, no rub/gallop auscultated. RRR.  Psychiatric: Normal judgment/insight. Normal mood and affect. Oriented x3.    No results found for this or any previous visit (from the past 72 hour(s)).    ASSESSMENT/PLAN:  Patient resistant to starting medications and has history of multiple side effects from other psychiatric meds in the past. Advised non-benzodiazepine as needed for anxiety, see patient instructions for administration of buspirone and when necessary use of Elavil for sleep. Patient had left over Linzess which she has been taking, hasn't noticed much difference over the past few days she's been taking it, advised that she should continue this as a daily medicine and replaced a refill, see below. With certainly consider referral to behavioral health for counseling, patient states she is not interested in that at this time, we'll see how she is doing after interpersonal issues come down a bit.  Anxiety state - Plan: busPIRone (BUSPAR) 15 MG tablet  Insomnia - Plan: amitriptyline (ELAVIL) 50 MG tablet  Other irritable bowel syndrome - Plan: Linaclotide (LINZESS) 145 MCG CAPS capsule, dicyclomine (BENTYL) 20 MG tablet   Return in about 6 weeks (around 06/03/2015), or sooner if symptoms worsen or fail to improve, for MEDICATION MANAGEMENT .

## 2015-04-22 NOTE — Patient Instructions (Signed)
For constipation: Continue doing and I'm called in lower dose of the Linzess as well as the dicyclomine. Linzess is a daily medication to prevent constipation.  For anxiety/stress: We are starting a medication called buspirone/BuSpar, 15 mg tablet, can take 1 tablet 3 times a day as needed. Maximum dose of this medicine is 60 mg per day, sp if you want to take 2 tablets twice per day, that fine, too.   For insomnia: We are starting amitriptyline/Elavil, 50 mg tablet, try cutting this in half for the first few nights that you use it, if that works you can continue that as needed for insomnia, you can also go up to one whole tablet at bedtime as needed. He can try taking off the nicotine patch at night and see if this helps as well.  With plan to follow-up again in the next 6 weeks or so to see how your doing, you can come in sooner if needed. Please call the office if you are having any concerns about side effects of medicines. If you're taking them as dictated above for about a week and not noticing any difference, please call the office.   Thanks and take care!

## 2015-04-28 ENCOUNTER — Ambulatory Visit: Payer: Federal, State, Local not specified - PPO | Admitting: Osteopathic Medicine

## 2015-05-26 ENCOUNTER — Ambulatory Visit (INDEPENDENT_AMBULATORY_CARE_PROVIDER_SITE_OTHER): Payer: Federal, State, Local not specified - PPO | Admitting: Osteopathic Medicine

## 2015-05-26 ENCOUNTER — Encounter: Payer: Self-pay | Admitting: Osteopathic Medicine

## 2015-05-26 VITALS — BP 168/89 | HR 101 | Temp 99.1°F | Ht 65.0 in | Wt 177.0 lb

## 2015-05-26 DIAGNOSIS — J069 Acute upper respiratory infection, unspecified: Secondary | ICD-10-CM

## 2015-05-26 DIAGNOSIS — B9789 Other viral agents as the cause of diseases classified elsewhere: Principal | ICD-10-CM

## 2015-05-26 MED ORDER — IPRATROPIUM BROMIDE 0.03 % NA SOLN: 2.0000 | Freq: Three times a day (TID) | NASAL | Status: DC

## 2015-05-26 MED ORDER — HYDROCODONE-HOMATROPINE 5-1.5 MG/5ML PO SYRP: 5.0000 mL | ORAL_SOLUTION | Freq: Three times a day (TID) | ORAL | Status: DC | PRN

## 2015-05-26 MED ORDER — METHYLPREDNISOLONE 4 MG PO TBPK: ORAL_TABLET | ORAL | Status: DC

## 2015-05-26 MED ORDER — AMOXICILLIN-POT CLAVULANATE 875-125 MG PO TABS: 1.0000 | ORAL_TABLET | Freq: Two times a day (BID) | ORAL | Status: DC

## 2015-05-26 MED ORDER — BENZONATATE 200 MG PO CAPS: 200.0000 mg | ORAL_CAPSULE | Freq: Three times a day (TID) | ORAL | Status: DC | PRN

## 2015-05-26 NOTE — Progress Notes (Signed)
HPI: Tina Navarro is a 55 y.o. female who [facility]  [place] for chief complaint of:  Chief Complaint  Patient presents with  . Cough    . Quality: COUGHING, SINUS DRAINAGE . Assoc signs/symptoms: see ROS . Duration: 4-5 days . Modifying factors: has tried the following OTC/Rx medications: DayQuil, NyQuil, mucinex   Past medical, social and family history reviewed. Current medications and allergies reviewed.     Review of Systems: CONSTITUTIONAL: no fever/chills HEAD/EYES/EARS/NOSE/THROAT: no headache, no vision change or hearing change, yes sore throat CARDIAC: No chest pain/pressure/palpitations RESPIRATORY: yes cough, no shortness of breath GASTROINTESTINAL: yes nausea, no vomiting, no abdominal pain, no diarrhea MUSCULOSKELETAL: no myalgia/arthralgia   Exam:  BP 168/89 mmHg  Pulse 101  Temp(Src) 99.1 F (37.3 C) (Oral)  Ht 5\' 5"  (1.651 m)  Wt 177 lb (80.287 kg)  BMI 29.45 kg/m2 Constitutional: VSS, see above. General Appearance: alert, well-developed, well-nourished, NAD Eyes: Normal lids and conjunctive, non-icteric sclera, PERRLA Ears, Nose, Mouth, Throat: Normal external inspection ears/nares/mouth/lips/gums, normal TM, MMM;       posterior pharynx without erythema, without exudate Neck: No masses, trachea midline. Normal lymph nodes Respiratory: Normal respiratory effort. No  wheeze/rhonchi/rales Cardiovascular: S1/S2 normal, no murmur/rub/gallop auscultated. RRR.   No results found for this or any previous visit (from the past 72 hour(s)). No results found.   ASSESSMENT/PLAN: OTC med list as well, avoid Ibuprofen, decongestants. Abx to fill 1 week after sx started 05/29/15 if sinus pressure persists but do not fill sooner, most likely viral. Followup for BP recheck, ER/RTC precautions reviewed.   Viral URI with cough - Plan: ipratropium (ATROVENT) 0.03 % nasal spray, HYDROcodone-homatropine (HYCODAN) 5-1.5  MG/5ML syrup, benzonatate (TESSALON) 200 MG capsule, methylPREDNISolone (MEDROL DOSEPAK) 4 MG TBPK tablet, amoxicillin-clavulanate (AUGMENTIN) 875-125 MG tablet    No Follow-up on file.

## 2015-05-26 NOTE — Patient Instructions (Signed)
DR. Sunnie Odden'S HOME CARE INSTRUCTIONS: VIRAL ILLNESSES - ACHES/PAINS, SORE THROAT, SINUSITIS, COUGH, GASTRITIS   FRIST, A FEW NOTES ON OVER-THE-COUNTER MEDICATIONS!   USE CAUTION - MANY OVER-THE-COUNTER MEDICATIONS COME IN COMBINATIONS OF MULTIPLE GENERIC MEDICINES. FOR INSTANCE, NYQUIL HAS TYLENOL + COUGH MEDICINE + AN ANTIHISTAMINE, SO BE CAREFUL YOU'RE NOT TAKING A COMBINATION MEDICINE WHICH CONTAINS MEDICATIONS YOU'RE ALSO TAKING SEPARATELY (LIKE NYQUIL SYRUP PLUS TYLENOL PILLS).   YOUR PHARMACIST CAN HELP YOU AVOID MEDICATION INTERACTIONS AND DUPLICATIONS - ASK FOR THEIR HELP IF YOU ARE CONFUSED OR UNSURE ABOUT WHAT TO PURCHASE OVER-THE-COUNTER!   REMEMBER - IF YOU'RE EVER CONCERNED ABOUT MEDICATION SIDE EFFECTS, OR IF YOU'RE EVER CONCERNED YOUR SYMPTOMS ARE GETTING WORSE DESPITE TREATMENT, PLEASE CALL THE DOCTOR'S OFFICE! IF AFTER-HOURS, YOU CAN BE SEEN IN URGENT CARE OR, FOR SEVERE ILLNESS, PLEASE GO TO THE EMERGENCY ROOM.   TREAT SINUS CONGESTION, RUNNY NOSE & POSTNASAL DRIP:  Treat to increase airflow through sinuses, decrease congestion pain and prevent bacterial growth!   Remember, only 0.5-2% of sinus infections are due to a bacteria, the rest are due to a virus (usually the common cold) which will not get better with antibiotics!  NASAL SPRAYS: generally safe and should not interact with other medicines. Can take any of these medications, either alone or together... FLONASE (FLUTICASONE) - 2 sprays in each nostril twice per day (also a great allergy medicine to use long-term!) AFRIN (OXYMETOLAZONE) - use sparingly because it will cause rebound congestion, NEVER USE IN KIDS  SALINE NASAL SPRAY- no limit, but avoid use after other nasal sprays or it can wash the medicine away PRESCRTIPTION ATROVENT - as directed on prescription bottle  ANTIHISTAMINES: Helps dry out runny nose and decreases postnasal drip. Benadryl may cause drowsiness but other preparations should not be  as sedating. Certain kinds are not as safe in elderly individuals. OK to use unless Dr A says otherwise.  Only use one of the following... BENADRYL (DIPHENHYDRAMINE) - 25-50 mg every 6 hours ZYRTEC (CETIRIZINE) - 5-10 mg daily CLARITIN (LORATIDINE) - less potent. 10 mg daily ALLEGRA (FEXOFENADINE) - least likely to cause drowsiness! 180 mg daily or 60 mg twice per day  DECONGESTANTS: Helps dry out runny nose and helps with sinus pain. May cause insomnia, or sometimes elevated heart rate. Can cause problems if used often in people with high blood pressure. OK to use unless Dr A says otherwise. NEVER USE IN KIDS UNDER 2 YEARS OLD. Only use one of the following... SUDAFED (PSEUDOEPHEDINE) - 60 mg every 4 - 6 hours, also comes in 120 mg extended release every 12 hours, maximum 240 mg in 24 hours.  SUDAED PE (PHENYLEPHRINE) - 10 mg every 4 - 6 hours, maximum 60 mg per day  COMBINATIONS OF ABOVE ANTIHISTAMINES + DECONGESTANTS: these usually require you to show your ID at the pharmacy counter. You can also purchase these medicines separately as noted above.  Only use one of the following... ZYRTEC-D (CETIRIZINE + PSEUDOEPHEDRINE) - 12 hour formulation as directed CLARITIN-D (LORATIDINE + PSEUDOEPHEDRINE) - 12 and 24 hour formulations as directed ALLEGRA-D (FEXOFENADINE + PSEUDOEPHEDRINE) - 12 and 24 hour formulations as directed  PRESCRIPTION TREATMENT FOR SINUS PROBLEMS: Can include nasal sprays, pills, or antibiotics in the case of true bacterial infection. Not everyone needs an antibiotic but there are other medicines which will help you feel better while your body fights the infection!    TREAT COUGH & SORE THROAT:  Remember, cough is the body's way of protecting your   airways and lungs, it's a hard-wired reflex that is tough for medicines to treat!   Irritation to the airways will cause cough. This irritation is usually caused by upper airway problems like postnasal drip (treat as above)  and viral sore throat, but in severe cases can be due to lower airway problems like bronchitis or pneumonia, which a doctor can usually hear on exam of your lungs or see on an X-ray.  Sore throat is almost always due to a virus, but occasionally caused by certain strains of Strep, which requires antibiotics.   Exercise and smoking may make cough worse - take it easy while you're sick, and QUIT SMOKING!   Cough due to viral infection can linger for 2-3 weeks or so. If you're coughing longer than you think you should, or if the cough is severe, please make an appointment in the office - you may need a chest X-ray.  EXPECTORANT: Used to help clear airways, take these with PLENTY of water to help thin mucus secretions and make the mucus easier to cough up  Only use one of the following... ROBITUSSIN (DEXTROMETHORPHAN OR GUAIFENISEN depending on formulation)  MUCINEX (GUAIFENICEN) - usually longer acting  COUGH DROPS/LOZENGES: Whichever over-the-counter agent you prefer!  Here are some suggestions for ingredients to look for (can take both)... BENZOCAINE - numbing effect, also in CHLORASEPTIC THROAT SPRAY MENTHOL - cooling effect  HONEY: has gone head-to-head in several clinical trials with prescription cough medicines and found to be equally effective! Try 1 Teaspoon Honey + 2 Drops Lemon Juice, as much as you want to use. NONE FOR KIDS UNDER AGE 2  HERBAL TEA: There are certain ingredients which help "coat the throat" to relieve pain  such as ELM BARK, LICORICE ROOT, MARSHMALLOW ROOT  PRESCRIPTION TREATMENT FOR COUGH: Reserved for severe cases. Can include pills, syrups or inhalers.    TREAT ACHES & PAINS, FEVER:  Illness causes aches, pains and fever as your body increases its natural inflammation response to help fight the infection.   Rest, good hydration and nutrition, and taking anti-inflammatory medications will help.   Remember: a true fever is a temperature 100.4 or  higher. If you have a fever that is 105.0 or higher, that is a dangerous level and needs medical attention in the office or in the ER!  Can take both of these together if it's ok with your doctor... IBUPROFEN - 400-800 mg every 6 - 8 hours. Ibuprofen and similar medications can cause problems for people with heart disease or history of stomach ulcers, check with Dr A first if you're concerned. Lower doses are usually safe and effective.  TYLENOL (ACETAMINOPHEN) - 500-1000 mg every 6 hours. It won't cause problems with heart or stomach.     TREAT GASTROINTESTINAL SYMPTOMS:  Hydrate, hydrate, hydrate! Try drinking Gatorade/Powerade, broth/soup. Avoid milk and juice, these can make diarrhea worse. You can drink water, of course, but if you are also having vomiting and loose stool you are losing electrolytes which water alone can't replace.  IMMODIUM (LOPERAMIDE) - as directed on the bottle to help with loose stool PRESCRIPTION ZOFRAN (ONDANSETRON) OR PHENERGAN (PROMETHAZINE) - as directed to help nausea and vomiting. Try taking these before you eat if you are having trouble keeping food down.     REMEMBER - THE MOST IMPORTANT THINGS YOU CAN DO TO AVOID CATCHING OR SPREADING ILLNESS INCLUDE:   COVER YOUR COUGH WITH YOUR ARM, NOT WITH YOUR HANDS!   DISINFECT COMMONLY USED SURFACES (SUCH AS   TELEPHONES & DOORKNOBS) WHEN YOU OR SOMEONE CLOSE TO YOU IS FEELING SICK!   BE SURE VACCINES ARE UP TO DATE - GET A FLU SHOT EVERY YEAR! THE FLU CAN BE DEADLY FOR BABIES, ELDERLY FOLKS, AND PEOPLE WITH WEAK IMMUNE SYSTEMS - YOU SHOULD BE VACCINATED TO HELP PREVENT YOURSELF FROM GETTING SICK, BUT ALSO TO PREVENT SOMEONE ELSE FROM GETTING AN INFECTION WHICH MAY HOSPITALIZE OR KILL THEM.  GOOD NUTRITION AND HEALTHY LIFESTYLE WILL HELP YOUR IMMUNE SYSTEM YEAR-ROUND! THERE IS NO MAGIC SUPPLEMENT!  AND ABOVE ALL - WASH YOUR HANDS OFTEN!   

## 2015-06-02 ENCOUNTER — Encounter: Payer: Self-pay | Admitting: Osteopathic Medicine

## 2015-06-02 ENCOUNTER — Ambulatory Visit (INDEPENDENT_AMBULATORY_CARE_PROVIDER_SITE_OTHER): Payer: Federal, State, Local not specified - PPO | Admitting: Osteopathic Medicine

## 2015-06-02 ENCOUNTER — Ambulatory Visit (INDEPENDENT_AMBULATORY_CARE_PROVIDER_SITE_OTHER): Payer: Federal, State, Local not specified - PPO

## 2015-06-02 VITALS — BP 144/82 | HR 76 | Ht 65.0 in | Wt 177.0 lb

## 2015-06-02 DIAGNOSIS — R0989 Other specified symptoms and signs involving the circulatory and respiratory systems: Secondary | ICD-10-CM

## 2015-06-02 DIAGNOSIS — J0120 Acute ethmoidal sinusitis, unspecified: Secondary | ICD-10-CM

## 2015-06-02 DIAGNOSIS — R05 Cough: Secondary | ICD-10-CM

## 2015-06-02 DIAGNOSIS — R059 Cough, unspecified: Secondary | ICD-10-CM

## 2015-06-02 MED ORDER — AZITHROMYCIN 250 MG PO TABS: ORAL_TABLET | ORAL | Status: DC

## 2015-06-02 MED ORDER — FLUTICASONE PROPIONATE HFA 110 MCG/ACT IN AERO: 2.0000 | INHALATION_SPRAY | Freq: Two times a day (BID) | RESPIRATORY_TRACT | Status: DC

## 2015-06-02 MED ORDER — HYDROCODONE-HOMATROPINE 5-1.5 MG/5ML PO SYRP: 5.0000 mL | ORAL_SOLUTION | Freq: Three times a day (TID) | ORAL | Status: DC | PRN

## 2015-06-02 MED ORDER — METHYLPREDNISOLONE 4 MG PO TBPK: ORAL_TABLET | ORAL | Status: DC

## 2015-06-02 NOTE — Progress Notes (Signed)
HPI: Tina Navarro is a 55 y.o. female who presents to [facility] today for chief complaint of:  Chief Complaint  Patient presents with  . Follow-up  . Cough     . Location: sinuses, chest . Quality: coughing, occsionally productive, some sore throat . Duration: about 2 weeks altogether  . Timing: all day . Context: seen here 1 week ago for viral URI . Modifying factors: last visit Rx Atrovent, Hycodan, tessalon, medrol pack, Augmentin never filled. Was feeling a bit better but now sick again. Cough syrup does help, steroids were helping a bit but since off thes feels bad again.    Past medical, social and family history reviewed: Past Medical History  Diagnosis Date  . GERD (gastroesophageal reflux disease)   . Hyperlipidemia    Past Surgical History  Procedure Laterality Date  . Tonsillectomy    . Femoral stents    . Tubal ligation    . Nasal sinus surgery    . Endometrial biopsy  2005   Social History  Substance Use Topics  . Smoking status: Current Every Day Smoker -- 1.00 packs/day for 35 years  . Smokeless tobacco: Not on file  . Alcohol Use: No   Family History  Problem Relation Age of Onset  . Hypertension Mother   . Hypertension Father   . Heart disease Father   . Peripheral vascular disease Father   . Alcohol abuse Father   . Cancer Maternal Grandmother     BREAST  . Stroke Maternal Grandmother     CEBERAL ATHEROSCLEROSIS  . Cancer - Ovarian Cousin   . Fibroids Sister     Current Outpatient Prescriptions  Medication Sig Dispense Refill  . amitriptyline (ELAVIL) 50 MG tablet Take 0.5-1 tablets (25-50 mg total) by mouth at bedtime as needed for sleep. 30 tablet 1  . amoxicillin-clavulanate (AUGMENTIN) 875-125 MG tablet Take 1 tablet by mouth 2 (two) times daily. Fill only if persistent sinus pressure after 1 week of symptoms (05/29/15) 10 tablet 0  . aspirin 325 MG tablet Take 325 mg by mouth. Reported on 03/18/2015     . Aspirin-Salicylamide-Caffeine (BC HEADACHE POWDER PO) Take by mouth as needed.    Marland Kitchen atorvastatin (LIPITOR) 40 MG tablet Take 1 tablet (40 mg total) by mouth daily. 90 tablet 2  . benzonatate (TESSALON) 200 MG capsule Take 1 capsule (200 mg total) by mouth 3 (three) times daily as needed for cough. 20 capsule 0  . bisacodyl (BISACODYL LAXATIVE) 5 MG EC tablet Take 1-2 tablets (5-10 mg total) by mouth daily as needed for moderate constipation. 20 tablet 0  . busPIRone (BUSPAR) 15 MG tablet Take 1 tablet (15 mg total) by mouth 3 (three) times daily as needed (anxiety/stress). 30 tablet 1  . Cholecalciferol (VITAMIN D3) 5000 units TABS Take by mouth. Reported on 03/18/2015    . COMBIVENT RESPIMAT 20-100 MCG/ACT AERS respimat inhale 1 puff by mouth every 6 hours  1  . dicyclomine (BENTYL) 20 MG tablet Take 1 tablet (20 mg total) by mouth every 6 (six) hours. Reported on 03/18/2015 30 tablet 2  . HYDROcodone-homatropine (HYCODAN) 5-1.5 MG/5ML syrup Take 5 mLs by mouth every 8 (eight) hours as needed for cough. Caution: use in evenings, may cause sedation/drowsiness 120 mL 0  . ipratropium (ATROVENT) 0.03 % nasal spray Place 2 sprays into both nostrils 3 (three) times daily. 30 mL 0  . Linaclotide (LINZESS) 145 MCG CAPS capsule Take 1 capsule (145 mcg total)  by mouth daily. 30 capsule 1  . methylPREDNISolone (MEDROL DOSEPAK) 4 MG TBPK tablet 6-day pack as directed 21 tablet 0  . omeprazole (PRILOSEC) 40 MG capsule take 1 capsule by mouth once daily 1/2 HOUR BEFORE BREAKFAST  0  . promethazine (PHENERGAN) 12.5 MG tablet Take 1 tablet (12.5 mg total) by mouth every 6 (six) hours as needed for nausea or vomiting. Reported on 03/18/2015 30 tablet 2  . senna (SENOKOT) 8.6 MG tablet Take 2 tablets by mouth daily.     No current facility-administered medications for this visit.   Allergies  Allergen Reactions  . Doxycycline       Review of Systems: CONSTITUTIONAL:  No  fever, no  chills HEAD/EYES/EARS/NOSE/THROAT: No  headache, no vision change, no hearing change, (+) occsaoinal mild sore throat, (+) sinus pressure CARDIAC: No  chest pain, No  pressure, No palpitations, No  orthopnea RESPIRATORY: (+) cough, No  shortness of breath/wheeze GASTROINTESTINAL: No  nausea, No  vomiting, No  abdominal pain, No  blood in stool, No  diarrhea, No  constipation   Exam:  BP 144/82 mmHg  Pulse 76  Ht 5\' 5"  (1.651 m)  Wt 177 lb (80.287 kg)  BMI 29.45 kg/m2 Constitutional: VS see above. General Appearance: alert, well-developed, well-nourished, NAD Eyes: Normal lids and conjunctive, non-icteric sclera,  Ears, Nose, Mouth, Throat: MMM, Normal external inspection ears/nares/mouth/lips/gums, Pharynx no erythema, no exudate.  Neck: No masses, trachea midline. No thyroid enlargement/tenderness/mass appreciated. Mild lymphadenopathy Respiratory: Normal respiratory effort. no wheeze, no rhonchi, no rales Cardiovascular: S1/S2 normal, no murmur, no rub/gallop auscultated. RRR. No lower extremity edema.    CXR on personal review Cardiomediastinal silhouette/heart size: normal Obvious bony abnormality: none Infiltrate: none Mass or other opacity: none Atelectasis: possible Diaphragms: ?poor inspiratoin Lateral view: normal Images were reviewed with the patient. Pt counseled that radiologist will review the images as well, our office will call if the formal read reveals any significant findings other than what has been noted above.     ASSESSMENT/PLAN: OTC list also reprinted, highlighted things she can take and explained which ingredients are in standard DayQuli & NyQuil. Ask pharmacist for any concerns. Advised postviral cough can linger but may have viral bronchitis , also supect sinusitis given duration of sinus problems, and cough may also be viral   Acute ethmoidal sinusitis, recurrence not specified - Plan: azithromycin (ZITHROMAX) 250 MG tablet  Cough - Plan: DG Chest 2  View, HYDROcodone-homatropine (HYCODAN) 5-1.5 MG/5ML syrup, methylPREDNISolone (MEDROL DOSEPAK) 4 MG TBPK tablet, fluticasone (FLOVENT HFA) 110 MCG/ACT inhaler   Return if symptoms worsen or fail to improve.

## 2015-06-02 NOTE — Patient Instructions (Signed)
DR. Esten Dollar'S HOME CARE INSTRUCTIONS: VIRAL ILLNESSES - ACHES/PAINS, SORE THROAT, SINUSITIS, COUGH, GASTRITIS   FRIST, A FEW NOTES ON OVER-THE-COUNTER MEDICATIONS!   USE CAUTION - MANY OVER-THE-COUNTER MEDICATIONS COME IN COMBINATIONS OF MULTIPLE GENERIC MEDICINES. FOR INSTANCE, NYQUIL HAS TYLENOL + COUGH MEDICINE + AN ANTIHISTAMINE, SO BE CAREFUL YOU'RE NOT TAKING A COMBINATION MEDICINE WHICH CONTAINS MEDICATIONS YOU'RE ALSO TAKING SEPARATELY (LIKE NYQUIL SYRUP PLUS TYLENOL PILLS).   YOUR PHARMACIST CAN HELP YOU AVOID MEDICATION INTERACTIONS AND DUPLICATIONS - ASK FOR THEIR HELP IF YOU ARE CONFUSED OR UNSURE ABOUT WHAT TO PURCHASE OVER-THE-COUNTER!   REMEMBER - IF YOU'RE EVER CONCERNED ABOUT MEDICATION SIDE EFFECTS, OR IF YOU'RE EVER CONCERNED YOUR SYMPTOMS ARE GETTING WORSE DESPITE TREATMENT, PLEASE CALL THE DOCTOR'S OFFICE! IF AFTER-HOURS, YOU CAN BE SEEN IN URGENT CARE OR, FOR SEVERE ILLNESS, PLEASE GO TO THE EMERGENCY ROOM.   TREAT SINUS CONGESTION, RUNNY NOSE & POSTNASAL DRIP:  Treat to increase airflow through sinuses, decrease congestion pain and prevent bacterial growth!   Remember, only 0.5-2% of sinus infections are due to a bacteria, the rest are due to a virus (usually the common cold) which will not get better with antibiotics!  NASAL SPRAYS: generally safe and should not interact with other medicines. Can take any of these medications, either alone or together... FLONASE (FLUTICASONE) - 2 sprays in each nostril twice per day (also a great allergy medicine to use long-term!) AFRIN (OXYMETOLAZONE) - use sparingly because it will cause rebound congestion, NEVER USE IN KIDS  SALINE NASAL SPRAY- no limit, but avoid use after other nasal sprays or it can wash the medicine away PRESCRTIPTION ATROVENT - as directed on prescription bottle  ANTIHISTAMINES: Helps dry out runny nose and decreases postnasal drip. Benadryl may cause drowsiness but other preparations should not be  as sedating. Certain kinds are not as safe in elderly individuals. OK to use unless Dr A says otherwise.  Only use one of the following... BENADRYL (DIPHENHYDRAMINE) - 25-50 mg every 6 hours ZYRTEC (CETIRIZINE) - 5-10 mg daily CLARITIN (LORATIDINE) - less potent. 10 mg daily ALLEGRA (FEXOFENADINE) - least likely to cause drowsiness! 180 mg daily or 60 mg twice per day  DECONGESTANTS: Helps dry out runny nose and helps with sinus pain. May cause insomnia, or sometimes elevated heart rate. Can cause problems if used often in people with high blood pressure. OK to use unless Dr A says otherwise. NEVER USE IN KIDS UNDER 2 YEARS OLD. Only use one of the following... SUDAFED (PSEUDOEPHEDINE) - 60 mg every 4 - 6 hours, also comes in 120 mg extended release every 12 hours, maximum 240 mg in 24 hours.  SUDAED PE (PHENYLEPHRINE) - 10 mg every 4 - 6 hours, maximum 60 mg per day  COMBINATIONS OF ABOVE ANTIHISTAMINES + DECONGESTANTS: these usually require you to show your ID at the pharmacy counter. You can also purchase these medicines separately as noted above.  Only use one of the following... ZYRTEC-D (CETIRIZINE + PSEUDOEPHEDRINE) - 12 hour formulation as directed CLARITIN-D (LORATIDINE + PSEUDOEPHEDRINE) - 12 and 24 hour formulations as directed ALLEGRA-D (FEXOFENADINE + PSEUDOEPHEDRINE) - 12 and 24 hour formulations as directed  PRESCRIPTION TREATMENT FOR SINUS PROBLEMS: Can include nasal sprays, pills, or antibiotics in the case of true bacterial infection. Not everyone needs an antibiotic but there are other medicines which will help you feel better while your body fights the infection!    TREAT COUGH & SORE THROAT:  Remember, cough is the body's way of protecting your   airways and lungs, it's a hard-wired reflex that is tough for medicines to treat!   Irritation to the airways will cause cough. This irritation is usually caused by upper airway problems like postnasal drip (treat as above)  and viral sore throat, but in severe cases can be due to lower airway problems like bronchitis or pneumonia, which a doctor can usually hear on exam of your lungs or see on an X-ray.  Sore throat is almost always due to a virus, but occasionally caused by certain strains of Strep, which requires antibiotics.   Exercise and smoking may make cough worse - take it easy while you're sick, and QUIT SMOKING!   Cough due to viral infection can linger for 2-3 weeks or so. If you're coughing longer than you think you should, or if the cough is severe, please make an appointment in the office - you may need a chest X-ray.  EXPECTORANT: Used to help clear airways, take these with PLENTY of water to help thin mucus secretions and make the mucus easier to cough up  Only use one of the following... ROBITUSSIN (DEXTROMETHORPHAN OR GUAIFENISEN depending on formulation)  MUCINEX (GUAIFENICEN) - usually longer acting  COUGH DROPS/LOZENGES: Whichever over-the-counter agent you prefer!  Here are some suggestions for ingredients to look for (can take both)... BENZOCAINE - numbing effect, also in CHLORASEPTIC THROAT SPRAY MENTHOL - cooling effect  HONEY: has gone head-to-head in several clinical trials with prescription cough medicines and found to be equally effective! Try 1 Teaspoon Honey + 2 Drops Lemon Juice, as much as you want to use. NONE FOR KIDS UNDER AGE 2  HERBAL TEA: There are certain ingredients which help "coat the throat" to relieve pain  such as ELM BARK, LICORICE ROOT, MARSHMALLOW ROOT  PRESCRIPTION TREATMENT FOR COUGH: Reserved for severe cases. Can include pills, syrups or inhalers.    TREAT ACHES & PAINS, FEVER:  Illness causes aches, pains and fever as your body increases its natural inflammation response to help fight the infection.   Rest, good hydration and nutrition, and taking anti-inflammatory medications will help.   Remember: a true fever is a temperature 100.4 or  higher. If you have a fever that is 105.0 or higher, that is a dangerous level and needs medical attention in the office or in the ER!  Can take both of these together if it's ok with your doctor... IBUPROFEN - 400-800 mg every 6 - 8 hours. Ibuprofen and similar medications can cause problems for people with heart disease or history of stomach ulcers, check with Dr A first if you're concerned. Lower doses are usually safe and effective.  TYLENOL (ACETAMINOPHEN) - 500-1000 mg every 6 hours. It won't cause problems with heart or stomach.     TREAT GASTROINTESTINAL SYMPTOMS:  Hydrate, hydrate, hydrate! Try drinking Gatorade/Powerade, broth/soup. Avoid milk and juice, these can make diarrhea worse. You can drink water, of course, but if you are also having vomiting and loose stool you are losing electrolytes which water alone can't replace.  IMMODIUM (LOPERAMIDE) - as directed on the bottle to help with loose stool PRESCRIPTION ZOFRAN (ONDANSETRON) OR PHENERGAN (PROMETHAZINE) - as directed to help nausea and vomiting. Try taking these before you eat if you are having trouble keeping food down.     REMEMBER - THE MOST IMPORTANT THINGS YOU CAN DO TO AVOID CATCHING OR SPREADING ILLNESS INCLUDE:   COVER YOUR COUGH WITH YOUR ARM, NOT WITH YOUR HANDS!   DISINFECT COMMONLY USED SURFACES (SUCH AS   TELEPHONES & DOORKNOBS) WHEN YOU OR SOMEONE CLOSE TO YOU IS FEELING SICK!   BE SURE VACCINES ARE UP TO DATE - GET A FLU SHOT EVERY YEAR! THE FLU CAN BE DEADLY FOR BABIES, ELDERLY FOLKS, AND PEOPLE WITH WEAK IMMUNE SYSTEMS - YOU SHOULD BE VACCINATED TO HELP PREVENT YOURSELF FROM GETTING SICK, BUT ALSO TO PREVENT SOMEONE ELSE FROM GETTING AN INFECTION WHICH MAY HOSPITALIZE OR KILL THEM.  GOOD NUTRITION AND HEALTHY LIFESTYLE WILL HELP YOUR IMMUNE SYSTEM YEAR-ROUND! THERE IS NO MAGIC SUPPLEMENT!  AND ABOVE ALL - WASH YOUR HANDS OFTEN!   

## 2015-06-03 ENCOUNTER — Ambulatory Visit: Payer: Federal, State, Local not specified - PPO | Admitting: Osteopathic Medicine

## 2015-06-28 ENCOUNTER — Ambulatory Visit (INDEPENDENT_AMBULATORY_CARE_PROVIDER_SITE_OTHER): Payer: Federal, State, Local not specified - PPO | Admitting: Family Medicine

## 2015-06-28 VITALS — BP 172/78 | HR 79 | Wt 177.0 lb

## 2015-06-28 DIAGNOSIS — H8111 Benign paroxysmal vertigo, right ear: Secondary | ICD-10-CM

## 2015-06-28 DIAGNOSIS — H8110 Benign paroxysmal vertigo, unspecified ear: Secondary | ICD-10-CM | POA: Insufficient documentation

## 2015-06-28 NOTE — Progress Notes (Signed)
Tina Navarro is a 55 y.o. female who presents to [facility]: Primary Care today for vertigo. Patient notes a 1 week history of vertigo. She notes an intense room spinning sensation that lasts 30-60 seconds that triggers with head motion. She denies any CP, Palpitations, SOB, or syncope. She denies any injury. She went to her chiropractor who though she did not have any MSK issues and recommend she f/u with her PCP.  She denies any weakness, numbness, or loss of function.    Past Medical History  Diagnosis Date  . GERD (gastroesophageal reflux disease)   . Hyperlipidemia    Past Surgical History  Procedure Laterality Date  . Tonsillectomy    . Femoral stents    . Tubal ligation    . Nasal sinus surgery    . Endometrial biopsy  2005   Social History  Substance Use Topics  . Smoking status: Current Every Day Smoker -- 1.00 packs/day for 35 years  . Smokeless tobacco: Not on file  . Alcohol Use: No   family history includes Alcohol abuse in her father; Cancer in her maternal grandmother; Cancer - Ovarian in her cousin; Fibroids in her sister; Heart disease in her father; Hypertension in her father and mother; Peripheral vascular disease in her father; Stroke in her maternal grandmother.  ROS as above Medications: Current Outpatient Prescriptions  Medication Sig Dispense Refill  . amitriptyline (ELAVIL) 50 MG tablet Take 0.5-1 tablets (25-50 mg total) by mouth at bedtime as needed for sleep. 30 tablet 1  . aspirin 325 MG tablet Take 325 mg by mouth. Reported on 03/18/2015    . Aspirin-Salicylamide-Caffeine (BC HEADACHE POWDER PO) Take by mouth as needed.    Marland Kitchen atorvastatin (LIPITOR) 40 MG tablet Take 1 tablet (40 mg total) by mouth daily. 90 tablet 2  . azithromycin (ZITHROMAX) 250 MG tablet 2 tabs po on Day 1, then 1 tab daily Days 2 - 5 6 tablet 0  . benzonatate (TESSALON) 200 MG capsule Take  1 capsule (200 mg total) by mouth 3 (three) times daily as needed for cough. (Patient not taking: Reported on 06/02/2015) 20 capsule 0  . bisacodyl (BISACODYL LAXATIVE) 5 MG EC tablet Take 1-2 tablets (5-10 mg total) by mouth daily as needed for moderate constipation. 20 tablet 0  . busPIRone (BUSPAR) 15 MG tablet Take 1 tablet (15 mg total) by mouth 3 (three) times daily as needed (anxiety/stress). 30 tablet 1  . Cholecalciferol (VITAMIN D3) 5000 units TABS Take by mouth. Reported on 03/18/2015    . COMBIVENT RESPIMAT 20-100 MCG/ACT AERS respimat inhale 1 puff by mouth every 6 hours  1  . dicyclomine (BENTYL) 20 MG tablet Take 1 tablet (20 mg total) by mouth every 6 (six) hours. Reported on 03/18/2015 30 tablet 2  . fluticasone (FLOVENT HFA) 110 MCG/ACT inhaler Inhale 2 puffs into the lungs 2 (two) times daily. For cough 1 Inhaler 0  . HYDROcodone-homatropine (HYCODAN) 5-1.5 MG/5ML syrup Take 5 mLs by mouth every 8 (eight) hours as needed for cough. Caution: use in evenings, may cause sedation/drowsiness 120 mL 0  . ipratropium (ATROVENT) 0.03 % nasal spray Place 2 sprays into both nostrils 3 (three) times daily. 30 mL 0  . Linaclotide (LINZESS) 145 MCG CAPS capsule Take 1 capsule (145 mcg total) by mouth daily. 30 capsule 1  . methylPREDNISolone (MEDROL DOSEPAK) 4 MG TBPK tablet 6-day pack as directed 21 tablet 0  . omeprazole (PRILOSEC) 40 MG  capsule take 1 capsule by mouth once daily 1/2 HOUR BEFORE BREAKFAST  0  . promethazine (PHENERGAN) 12.5 MG tablet Take 1 tablet (12.5 mg total) by mouth every 6 (six) hours as needed for nausea or vomiting. Reported on 03/18/2015 30 tablet 2  . senna (SENOKOT) 8.6 MG tablet Take 2 tablets by mouth daily.     No current facility-administered medications for this visit.   Allergies  Allergen Reactions  . Doxycycline      Exam:  BP 172/78 mmHg  Pulse 79  Wt 177 lb (80.287 kg)  SpO2 100% Gen: Well NAD HEENT: EOMI,  MMM, PERRLA Normal TMs BL.  Lungs:  Normal work of breathing. CTABL Heart: RRR no MRG Abd: NABS, Soft. Nondistended, Nontender Exts: Brisk capillary refill, warm and well perfused.  Neuro: AOx3. Normal motion, balance, strength and coordination.  Positive Dix-Hallpike test right negative left.   No results found for this or any previous visit (from the past 24 hour(s)). No results found.    55 yo woman with right sided BPPV.  Plan to refer to vestibular PT.  Recheck PRN.  Warned against driving.

## 2015-06-28 NOTE — Patient Instructions (Signed)
Thank you for coming in today.  Call to schedule Vestibular PT for BPPV.   [facility]) [phone]  Look up right side Epley Maneuver.   Benign Positional Vertigo Vertigo is the feeling that you or your surroundings are moving when they are not. Benign positional vertigo is the most common form of vertigo. The cause of this condition is not serious (is benign). This condition is triggered by certain movements and positions (is positional). This condition can be dangerous if it occurs while you are doing something that could endanger you or others, such as driving.  CAUSES In many cases, the cause of this condition is not known. It may be caused by a disturbance in an area of the inner ear that helps your brain to sense movement and balance. This disturbance can be caused by a viral infection (labyrinthitis), head injury, or repetitive motion. RISK FACTORS This condition is more likely to develop in: 1. Women. 2. People who are 53 years of age or older. SYMPTOMS Symptoms of this condition usually happen when you move your head or your eyes in different directions. Symptoms may start suddenly, and they usually last for less than a minute. Symptoms may include:  Loss of balance and falling.  Feeling like you are spinning or moving.  Feeling like your surroundings are spinning or moving.  Nausea and vomiting.  Blurred vision.  Dizziness.  Involuntary eye movement (nystagmus). Symptoms can be mild and cause only slight annoyance, or they can be severe and interfere with daily life. Episodes of benign positional vertigo may return (recur) over time, and they may be triggered by certain movements. Symptoms may improve over time. DIAGNOSIS This condition is usually diagnosed by medical history and a physical exam of the head, neck, and ears. You may be referred to a health care provider who specializes in ear, nose, and throat (ENT) problems  (otolaryngologist) or a provider who specializes in disorders of the nervous system (neurologist). You may have additional testing, including:  MRI.  A CT scan.  Eye movement tests. Your health care provider may ask you to change positions quickly while he or she watches you for symptoms of benign positional vertigo, such as nystagmus. Eye movement may be tested with an electronystagmogram (ENG), caloric stimulation, the Dix-Hallpike test, or the roll test.  An electroencephalogram (EEG). This records electrical activity in your brain.  Hearing tests. TREATMENT Usually, your health care provider will treat this by moving your head in specific positions to adjust your inner ear back to normal. Surgery may be needed in severe cases, but this is rare. In some cases, benign positional vertigo may resolve on its own in 2-4 weeks. HOME CARE INSTRUCTIONS Safety  Move slowly.Avoid sudden body or head movements.  Avoid driving.  Avoid operating heavy machinery.  Avoid doing any tasks that would be dangerous to you or others if a vertigo episode would occur.  If you have trouble walking or keeping your balance, try using a cane for stability. If you feel dizzy or unstable, sit down right away.  Return to your normal activities as told by your health care provider. Ask your health care provider what activities are safe for you. General Instructions  Take over-the-counter and prescription medicines only as told by your health care provider.  Avoid certain positions or movements as told by your health care provider.  Drink enough fluid to keep your urine clear or pale yellow.  Keep all follow-up visits as told  by your health care provider. This is important. SEEK MEDICAL CARE IF:  You have a fever.  Your condition gets worse or you develop new symptoms.  Your family or friends notice any behavioral changes.  Your nausea or vomiting gets worse.  You have numbness or a "pins and  needles" sensation. SEEK IMMEDIATE MEDICAL CARE IF:  You have difficulty speaking or moving.  You are always dizzy.  You faint.  You develop severe headaches.  You have weakness in your legs or arms.  You have changes in your hearing or vision.  You develop a stiff neck.  You develop sensitivity to light.   This information is not intended to replace advice given to you by your health care provider. Make sure you discuss any questions you have with your health care provider.   Document Released: 11/20/2005 Document Revised: 11/03/2014 Document Reviewed: 06/07/2014 Elsevier Interactive Patient Education 2016 [employer].  Teaching laboratory technician Self-Care WHAT IS THE EPLEY MANEUVER? The Epley maneuver is an exercise you can do to relieve symptoms of benign paroxysmal positional vertigo (BPPV). This condition is often just referred to as vertigo. BPPV is caused by the movement of tiny crystals (canaliths) inside your inner ear. The accumulation and movement of canaliths in your inner ear causes a sudden spinning sensation (vertigo) when you move your head to certain positions. Vertigo usually lasts about 30 seconds. BPPV usually occurs in just one ear. If you get vertigo when you lie on your left side, you probably have BPPV in your left ear. Your health care provider can tell you which ear is involved.  BPPV may be caused by a head injury. Many people older than 50 get BPPV for unknown reasons. If you have been diagnosed with BPPV, your health care provider may teach you how to do this maneuver. BPPV is not life threatening (benign) and usually goes away in time.  WHEN SHOULD I PERFORM THE EPLEY MANEUVER? You can do this maneuver at home whenever you have symptoms of vertigo. You may do the Epley maneuver up to 3 times a day until your symptoms of vertigo go away. HOW SHOULD I DO THE EPLEY MANEUVER? 3. Sit on the edge of a bed or table with your back straight. Your legs should be extended or  hanging over the edge of the bed or table.  4. Turn your head halfway toward the affected ear.  5. Lie backward quickly with your head turned until you are lying flat on your back. You may want to position a pillow under your shoulders.  6. Hold this position for 30 seconds. You may experience an attack of vertigo. This is normal. Hold this position until the vertigo stops. 7. Then turn your head to the opposite direction until your unaffected ear is facing the floor.  8. Hold this position for 30 seconds. You may experience an attack of vertigo. This is normal. Hold this position until the vertigo stops. 9. Now turn your whole body to the same side as your head. Hold for another 30 seconds.  10. You can then sit back up. ARE THERE RISKS TO THIS MANEUVER? In some cases, you may have other symptoms (such as changes in your vision, weakness, or numbness). If you have these symptoms, stop doing the maneuver and call your health care provider. Even if doing these maneuvers relieves your vertigo, you may still have dizziness. Dizziness is the sensation of light-headedness but without the sensation of movement. Even though the Epley maneuver  may relieve your vertigo, it is possible that your symptoms will return within 5 years. WHAT SHOULD I DO AFTER THIS MANEUVER? After doing the Epley maneuver, you can return to your normal activities. Ask your doctor if there is anything you should do at home to prevent vertigo. This may include:  Sleeping with two or more pillows to keep your head elevated.  Not sleeping on the side of your affected ear.  Getting up slowly from bed.  Avoiding sudden movements during the day.  Avoiding extreme head movement, like looking up or bending over.  Wearing a cervical collar to prevent sudden head movements. WHAT SHOULD I DO IF MY SYMPTOMS GET WORSE? Call your health care provider if your vertigo gets worse. Call your provider right way if you have other symptoms,  including:   Nausea.  Vomiting.  Headache.  Weakness.  Numbness.  Vision changes.   This information is not intended to replace advice given to you by your health care provider. Make sure you discuss any questions you have with your health care provider.   Document Released: 02/17/2013 Document Reviewed: 02/17/2013 Elsevier Interactive Patient Education [employer]2016 Elsevier Inc.

## 2015-06-30 ENCOUNTER — Ambulatory Visit: Payer: Federal, State, Local not specified - PPO | Admitting: Physical Therapy

## 2015-07-07 ENCOUNTER — Ambulatory Visit: Payer: Federal, State, Local not specified - PPO | Attending: Family Medicine | Admitting: Physical Therapy

## 2015-07-07 ENCOUNTER — Encounter: Payer: Self-pay | Admitting: Physical Therapy

## 2015-07-07 DIAGNOSIS — H8111 Benign paroxysmal vertigo, right ear: Secondary | ICD-10-CM | POA: Diagnosis not present

## 2015-07-07 NOTE — Patient Instructions (Addendum)
Self Treatment for Horizontal Canalithiasis - Right Side Affected    Beginning on back, roll onto right side for 60 seconds, then roll onto left side. Remain on left side for 12 hours.  Repeat procedure if you get up during the night. Perform nightly until symptom free.  Copyright  VHI. All rights reserved.  Benign Positional Vertigo Vertigo is the feeling that you or your surroundings are moving when they are not. Benign positional vertigo is the most common form of vertigo. The cause of this condition is not serious (is benign). This condition is triggered by certain movements and positions (is positional). This condition can be dangerous if it occurs while you are doing something that could endanger you or others, such as driving.  CAUSES In many cases, the cause of this condition is not known. It may be caused by a disturbance in an area of the inner ear that helps your brain to sense movement and balance. This disturbance can be caused by a viral infection (labyrinthitis), head injury, or repetitive motion. RISK FACTORS This condition is more likely to develop in:  Women.  People who are 62 years of age or older. SYMPTOMS Symptoms of this condition usually happen when you move your head or your eyes in different directions. Symptoms may start suddenly, and they usually last for less than a minute. Symptoms may include:  Loss of balance and falling.  Feeling like you are spinning or moving.  Feeling like your surroundings are spinning or moving.  Nausea and vomiting.  Blurred vision.  Dizziness.  Involuntary eye movement (nystagmus). Symptoms can be mild and cause only slight annoyance, or they can be severe and interfere with daily life. Episodes of benign positional vertigo may return (recur) over time, and they may be triggered by certain movements. Symptoms may improve over time. DIAGNOSIS This condition is usually diagnosed by medical history and a physical exam of the  head, neck, and ears. You may be referred to a health care provider who specializes in ear, nose, and throat (ENT) problems (otolaryngologist) or a provider who specializes in disorders of the nervous system (neurologist). You may have additional testing, including:  MRI.  A CT scan.  Eye movement tests. Your health care provider may ask you to change positions quickly while he or she watches you for symptoms of benign positional vertigo, such as nystagmus. Eye movement may be tested with an electronystagmogram (ENG), caloric stimulation, the Dix-Hallpike test, or the roll test.  An electroencephalogram (EEG). This records electrical activity in your brain.  Hearing tests. TREATMENT Usually, your health care provider will treat this by moving your head in specific positions to adjust your inner ear back to normal. Surgery may be needed in severe cases, but this is rare. In some cases, benign positional vertigo may resolve on its own in 2-4 weeks. HOME CARE INSTRUCTIONS Safety  Move slowly.Avoid sudden body or head movements.  Avoid driving.  Avoid operating heavy machinery.  Avoid doing any tasks that would be dangerous to you or others if a vertigo episode would occur.  If you have trouble walking or keeping your balance, try using a cane for stability. If you feel dizzy or unstable, sit down right away.  Return to your normal activities as told by your health care provider. Ask your health care provider what activities are safe for you. General Instructions  Take over-the-counter and prescription medicines only as told by your health care provider.  Avoid certain positions or movements as  told by your health care provider.  Drink enough fluid to keep your urine clear or pale yellow.  Keep all follow-up visits as told by your health care provider. This is important. SEEK MEDICAL CARE IF:  You have a fever.  Your condition gets worse or you develop new symptoms.  Your  family or friends notice any behavioral changes.  Your nausea or vomiting gets worse.  You have numbness or a "pins and needles" sensation. SEEK IMMEDIATE MEDICAL CARE IF:  You have difficulty speaking or moving.  You are always dizzy.  You faint.  You develop severe headaches.  You have weakness in your legs or arms.  You have changes in your hearing or vision.  You develop a stiff neck.  You develop sensitivity to light.   This information is not intended to replace advice given to you by your health care provider. Make sure you discuss any questions you have with your health care provider.   Document Released: 11/20/2005 Document Revised: 11/03/2014 Document Reviewed: 06/07/2014 Elsevier Interactive Patient Education [employer]2016 Elsevier Inc.

## 2015-07-10 ENCOUNTER — Encounter: Payer: Self-pay | Admitting: Physical Therapy

## 2015-07-10 NOTE — Therapy (Signed)
[facility] [address] [place], [place], [address] Phone: [phone]   Fax:  [phone]  Physical Therapy Evaluation  Patient Details  Name: Tina HailConnie Navarro MRN: [id number] Date of Birth: 12-11-1960 Referring Provider: Dr. Clementeen GrahamEvan Corey  Encounter Date: 07/07/2015      PT End of Session - 07/10/15 1934    Visit Number 1   Number of Visits 5   Date for PT Re-Evaluation 08/07/15   Authorization Type BCBS   PT Start Time 0845   PT Stop Time 0930   PT Time Calculation (min) 45 min      Past Medical History  Diagnosis Date  . GERD (gastroesophageal reflux disease)   . Hyperlipidemia     Past Surgical History  Procedure Laterality Date  . Tonsillectomy    . Femoral stents    . Tubal ligation    . Nasal sinus surgery    . Endometrial biopsy  2005    There were no vitals filed for this visit.       Subjective Assessment - 07/10/15 1931    Subjective Pt reports vertigo started about 2 weeks ago - noticed spinning vertigo with certain head movements and with rolling in bed   Patient Stated Goals resolve the vertigo   Currently in Pain? No/denies            [facility] 07/10/15 0001    Assessment   Medical Diagnosis R BPPV   Referring Provider Dr. Clementeen GrahamEvan Corey   Onset Date/Surgical Date 06/21/15  06-28-15 for MD appt   Precautions   Precautions Other (comment)  Vertigo   Balance Screen   Has the patient fallen in the past 6 months No   Has the patient had a decrease in activity level because of a fear of falling?  No   Is the patient reluctant to leave their home because of a fear of falling?  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 7 secs   Dix-Hallpike Right Symptoms Upbeat,  right rotatory nystagmus   Sidelying Right   Sidelying Right Duration approx. 4 secs   Sidelying Left   Sidelying Left Duration none   Sidelying Left Symptoms No nystagmus   Horizontal Canal Right   Horizontal Canal Right Duration approx. 20 secs       Pt treated with 1 rep of Epley maneuver for R BPPV posterior canalithiasis and bar-b-que roll for R BPPV horizontal canal x 2 reps  Instructed in repetitive rolling for R horizontal canalithiasis                PT Education - 07/10/15 1932    Education provided Yes   Education Details habituation for R horizontal canalithiasis   Person(s) Educated Patient   Methods Explanation;Demonstration;Handout   Comprehension Verbalized understanding;Returned demonstration             PT Long Term Goals - 07/10/15 1939    PT LONG TERM GOAL #1   Title Pt will have a (-) R Dix-Hallpike test to indicate resolution of R BPPV.  (08-07-15)   Time 4   Period Weeks   Status New   PT LONG TERM GOAL #2   Title Pt will have a  negative R horizontal roll test to indicate that R horizontal canal BPPV has resolved.  (08-07-15)   Time 4   Period Weeks   Status New   PT LONG TERM GOAL #3   Title Independent in HEP for habituation for BPPV.  (08-07-15)   Time 4   Period Weeks   Status New   PT LONG TERM GOAL #4   Title Pt will report no vertigo with bed mobility.  (08-07-15)   Time 4   Period Weeks   Status New               Plan - 07/10/15 1935    Clinical Impression Statement Pt has signs and symptoms consistent with R posterior and horizontal canalithiasis; R posterior canalithiasis appears resolved after Epley's maneuver but pt became nauseaus and sick with treatment for horizontal canal   Rehab Potential Good   PT Frequency 1x / week   PT Duration 4 weeks   PT Treatment/Interventions ADLs/Self Care Home Management;Patient/family education;Vestibular;Canalith Repostioning   PT Next Visit Plan recheck R horizontal canal  BPPV   PT Home Exercise Plan habituation   Consulted and Agree with Plan of Care Patient      Patient will benefit from skilled therapeutic intervention in order to improve the following deficits and impairments:  Dizziness  Visit Diagnosis: BPPV (benign paroxysmal positional vertigo), right - Plan: PT plan of care cert/re-cert     Problem List Patient Active Problem List   Diagnosis Date Noted  . BPPV (benign paroxysmal positional vertigo) 06/28/2015  . Irritable bowel syndrome 04/01/2015  . Hyperlipidemia 04/01/2015  . Current tobacco use 07/28/2012  . Multinodular goiter 12/19/2011  . CN (constipation) 07/28/2011  . Dyslipidemia 07/28/2011  . HYPERLIPIDEMIA 12/16/2009  . Acid reflux 09/25/2006  . Peripheral vascular disease (HCC) 09/25/2006    Tina Navarro, Tina Navarro, PT 07/10/2015, 7:50 PM  [facility] [address] [place], [place], [address] Phone: [phone]   Fax:  [phone]  Name: Tina Navarro MRN: [id number] Date of Birth: [birth year]

## 2015-07-11 ENCOUNTER — Ambulatory Visit: Payer: Federal, State, Local not specified - PPO | Admitting: Physical Therapy

## 2015-07-11 DIAGNOSIS — H8111 Benign paroxysmal vertigo, right ear: Secondary | ICD-10-CM | POA: Diagnosis not present

## 2015-07-12 DIAGNOSIS — K080 Exfoliation of teeth due to systemic causes: Secondary | ICD-10-CM | POA: Diagnosis not present

## 2015-07-16 NOTE — Therapy (Signed)
[facility] [address] [place], [place], [address] Phone: [phone]   Fax:  [phone]  Physical Therapy Treatment  Patient Details  Name: Tina Navarro MRN: [id number] Date of Birth: 11/29/1960 Referring Provider: Dr. Clementeen Graham  Encounter Date: 07/11/2015      PT End of Session - 07/16/15 2120    Visit Number 2   Date for PT Re-Evaluation 08/07/15   Authorization Type BCBS   PT Start Time 1618   PT Stop Time 1657   PT Time Calculation (min) 39 min      Past Medical History  Diagnosis Date  . GERD (gastroesophageal reflux disease)   . Hyperlipidemia     Past Surgical History  Procedure Laterality Date  . Tonsillectomy    . Femoral stents    . Tubal ligation    . Nasal sinus surgery    . Endometrial biopsy  2005    There were no vitals filed for this visit.      Subjective Assessment - 07/16/15 2119    Subjective Pt reports vertigo is much better "but is still there some"   Patient Stated Goals resolve the vertigo   Currently in Pain? No/denies        Bar-b -que roll for R horizontal canalithiasis performed x 3 reps   Self care; discussed etiology of BPPV to pt and husband; educated in need for repetitive rolling for habituation or in Bar-b-Que roll with husband's assistance   NeuroRe-ed; horizontal roll test (+) on R; (-) on L side; (-) R and L Dix-Hallpike tests                         PT Education - 07/16/15 2120    Education provided Yes   Education Details repetitive rolling for R BPPV horizontal canalithiasisi   Person(s) Educated Spouse;Patient   Methods Explanation   Comprehension Verbalized understanding;Returned demonstration             PT Long Term Goals - 07/10/15 1939    PT LONG TERM GOAL #1   Title Pt will have a (-) R Dix-Hallpike test to indicate resolution of R BPPV.  (08-07-15)   Time 4   Period Weeks   Status New   PT LONG TERM GOAL  #2   Title Pt will have a negative R horizontal roll test to indicate that R horizontal canal BPPV has resolved.  (08-07-15)   Time 4   Period Weeks   Status New   PT LONG TERM GOAL #3   Title Independent in HEP for habituation for BPPV.  (08-07-15)   Time 4   Period Weeks   Status New   PT LONG TERM GOAL #4   Title Pt will report no vertigo with bed mobility.  (08-07-15)   Time 4   Period Weeks   Status New               Plan - 07/16/15 2121    Clinical Impression Statement Pt continues to have R horizontal nystagmus - indicative of R horizontal canalithiasis - improved after doing bar-b-que roll for treatment   Rehab Potential Good   PT Frequency 1x / week   PT Duration 4 weeks   PT Treatment/Interventions ADLs/Self Care Home Management;Patient/family education;Vestibular;Canalith Repostioning   PT Next Visit Plan recheck R horizontal canal BPPV   PT Home Exercise Plan habituation   Consulted and Agree with Plan of Care Patient;Family member/caregiver  Family Member Consulted spouse      Patient will benefit from skilled therapeutic intervention in order to improve the following deficits and impairments:  Dizziness  Visit Diagnosis: BPPV (benign paroxysmal positional vertigo), right     Problem List Patient Active Problem List   Diagnosis Date Noted  . BPPV (benign paroxysmal positional vertigo) 06/28/2015  . Irritable bowel syndrome 04/01/2015  . Hyperlipidemia 04/01/2015  . Current tobacco use 07/28/2012  . Multinodular goiter 12/19/2011  . CN (constipation) 07/28/2011  . Dyslipidemia 07/28/2011  . HYPERLIPIDEMIA 12/16/2009  . Acid reflux 09/25/2006  . Peripheral vascular disease (HCC) 09/25/2006    Jeweldean Drohan, Donavan BurnetLinda Suzanne, PT 07/16/2015, 9:23 PM  [facility] [address] [place], [place], [address] Phone: [phone]   Fax:  [phone]  Name: Wylie HailConnie Tollett MRN: [id number] Date of  Birth: October 10, 1960

## 2015-07-18 DIAGNOSIS — K080 Exfoliation of teeth due to systemic causes: Secondary | ICD-10-CM | POA: Diagnosis not present

## 2015-09-07 DIAGNOSIS — E042 Nontoxic multinodular goiter: Secondary | ICD-10-CM | POA: Diagnosis not present

## 2015-10-12 DIAGNOSIS — R12 Heartburn: Secondary | ICD-10-CM | POA: Diagnosis not present

## 2015-10-12 DIAGNOSIS — K581 Irritable bowel syndrome with constipation: Secondary | ICD-10-CM | POA: Diagnosis not present

## 2015-10-12 DIAGNOSIS — R1013 Epigastric pain: Secondary | ICD-10-CM | POA: Diagnosis not present

## 2015-10-12 DIAGNOSIS — K219 Gastro-esophageal reflux disease without esophagitis: Secondary | ICD-10-CM | POA: Diagnosis not present

## 2015-10-19 ENCOUNTER — Encounter: Payer: Self-pay | Admitting: Osteopathic Medicine

## 2015-10-19 DIAGNOSIS — R1013 Epigastric pain: Secondary | ICD-10-CM | POA: Diagnosis not present

## 2015-11-08 DIAGNOSIS — Z1231 Encounter for screening mammogram for malignant neoplasm of breast: Secondary | ICD-10-CM | POA: Diagnosis not present

## 2015-11-09 DIAGNOSIS — M79606 Pain in leg, unspecified: Secondary | ICD-10-CM | POA: Diagnosis not present

## 2015-11-09 DIAGNOSIS — I739 Peripheral vascular disease, unspecified: Secondary | ICD-10-CM | POA: Diagnosis not present

## 2016-01-24 DIAGNOSIS — K080 Exfoliation of teeth due to systemic causes: Secondary | ICD-10-CM | POA: Diagnosis not present

## 2016-02-07 DIAGNOSIS — K3189 Other diseases of stomach and duodenum: Secondary | ICD-10-CM | POA: Diagnosis not present

## 2016-02-07 DIAGNOSIS — K209 Esophagitis, unspecified: Secondary | ICD-10-CM | POA: Diagnosis not present

## 2016-02-10 ENCOUNTER — Telehealth: Payer: Self-pay | Admitting: Osteopathic Medicine

## 2016-02-10 NOTE — Telephone Encounter (Signed)
I filled out and faxed prior authorization form for Linzess to [employer]EP at [phone]. Waiting on response. - CF

## 2016-02-14 NOTE — Telephone Encounter (Signed)
Received fax from FEP and Linzess 145 Mcg capsules a quantity of 90 for 90 days has been approved from 02/27/2016 through 05/27/2016. [id number]. - CF

## 2016-02-17 ENCOUNTER — Other Ambulatory Visit: Payer: Self-pay | Admitting: Osteopathic Medicine

## 2016-02-17 DIAGNOSIS — K588 Other irritable bowel syndrome: Secondary | ICD-10-CM

## 2016-03-18 ENCOUNTER — Encounter: Payer: Self-pay | Admitting: Osteopathic Medicine

## 2016-03-18 DIAGNOSIS — R198 Other specified symptoms and signs involving the digestive system and abdomen: Secondary | ICD-10-CM | POA: Insufficient documentation

## 2016-04-03 ENCOUNTER — Ambulatory Visit (INDEPENDENT_AMBULATORY_CARE_PROVIDER_SITE_OTHER): Payer: Federal, State, Local not specified - PPO | Admitting: Osteopathic Medicine

## 2016-04-03 ENCOUNTER — Encounter: Payer: Self-pay | Admitting: Osteopathic Medicine

## 2016-04-03 ENCOUNTER — Ambulatory Visit (INDEPENDENT_AMBULATORY_CARE_PROVIDER_SITE_OTHER): Payer: Federal, State, Local not specified - PPO

## 2016-04-03 VITALS — BP 178/84 | HR 94 | Ht 65.0 in | Wt 179.0 lb

## 2016-04-03 DIAGNOSIS — R42 Dizziness and giddiness: Secondary | ICD-10-CM | POA: Diagnosis not present

## 2016-04-03 DIAGNOSIS — N952 Postmenopausal atrophic vaginitis: Secondary | ICD-10-CM | POA: Diagnosis not present

## 2016-04-03 DIAGNOSIS — G933 Postviral fatigue syndrome: Secondary | ICD-10-CM | POA: Diagnosis not present

## 2016-04-03 DIAGNOSIS — R058 Other specified cough: Secondary | ICD-10-CM

## 2016-04-03 DIAGNOSIS — R05 Cough: Secondary | ICD-10-CM

## 2016-04-03 DIAGNOSIS — N644 Mastodynia: Secondary | ICD-10-CM | POA: Diagnosis not present

## 2016-04-03 DIAGNOSIS — G9331 Postviral fatigue syndrome: Secondary | ICD-10-CM

## 2016-04-03 DIAGNOSIS — M940 Chondrocostal junction syndrome [Tietze]: Secondary | ICD-10-CM

## 2016-04-03 DIAGNOSIS — Z720 Tobacco use: Secondary | ICD-10-CM | POA: Diagnosis not present

## 2016-04-03 DIAGNOSIS — R0602 Shortness of breath: Secondary | ICD-10-CM

## 2016-04-03 DIAGNOSIS — I10 Essential (primary) hypertension: Secondary | ICD-10-CM | POA: Diagnosis not present

## 2016-04-03 DIAGNOSIS — Z01419 Encounter for gynecological examination (general) (routine) without abnormal findings: Secondary | ICD-10-CM | POA: Diagnosis not present

## 2016-04-03 DIAGNOSIS — H8110 Benign paroxysmal vertigo, unspecified ear: Secondary | ICD-10-CM

## 2016-04-03 LAB — CBC WITH DIFFERENTIAL/PLATELET
BASOS PCT: 1 %
Basophils Absolute: 76 cells/uL (ref 0–200)
Eosinophils Absolute: 152 cells/uL (ref 15–500)
Eosinophils Relative: 2 %
HCT: 39.8 % (ref 35.0–45.0)
Hemoglobin: 13.2 g/dL (ref 11.7–15.5)
LYMPHS PCT: 31 %
Lymphs Abs: 2356 cells/uL (ref 850–3900)
MCH: 28.8 pg (ref 27.0–33.0)
MCHC: 33.2 g/dL (ref 32.0–36.0)
MCV: 86.7 fL (ref 80.0–100.0)
MONOS PCT: 7 %
MPV: 9 fL (ref 7.5–12.5)
Monocytes Absolute: 532 cells/uL (ref 200–950)
NEUTROS ABS: 4484 {cells}/uL (ref 1500–7800)
Neutrophils Relative %: 59 %
PLATELETS: 359 10*3/uL (ref 140–400)
RBC: 4.59 MIL/uL (ref 3.80–5.10)
RDW: 14.7 % (ref 11.0–15.0)
WBC: 7.6 10*3/uL (ref 3.8–10.8)

## 2016-04-03 MED ORDER — OLMESARTAN MEDOXOMIL 20 MG PO TABS: 10.0000 mg | ORAL_TABLET | Freq: Every day | ORAL | 0 refills | Status: DC

## 2016-04-03 MED ORDER — OLMESARTAN MEDOXOMIL 20 MG PO TABS: 20.0000 mg | ORAL_TABLET | Freq: Every day | ORAL | 1 refills | Status: DC

## 2016-04-03 NOTE — Progress Notes (Signed)
HPI: Tina Navarro is a 56 y.o. female  who presents to [facility] today, 04/03/16,  for chief complaint of:  Chief Complaint  Patient presents with  . Dizziness    Was sick about 2 weeks ago with cough, feeling better overall for about a week or so at this point but still having cough and some lightheadedness with standing or sitting up. Hx BPPV  SOB:  Comes and goes Worse on exertion  Rest improves the SOB  Cough makes it much worse  Chest pain: Feels like chest throbbing sharp pains  Located chest wall on L under and around side of breast  Ongoing about 3-4 days  Went to chiropractor yesterday, was getting nauseous and was told high BP  HTN: Elevated BP in the office on 2 occasions at this point CP/SOB symptoms as noted above     Past medical, surgical, social and family history reviewed: Patient Active Problem List   Diagnosis Date Noted  . Abnormal findings on esophagogastroduodenoscopy (EGD) 03/18/2016  . BPPV (benign paroxysmal positional vertigo) 06/28/2015  . Irritable bowel syndrome 04/01/2015  . Hyperlipidemia 04/01/2015  . Current tobacco use 07/28/2012  . Multinodular goiter 12/19/2011  . CN (constipation) 07/28/2011  . Dyslipidemia 07/28/2011  . HYPERLIPIDEMIA 12/16/2009  . Acid reflux 09/25/2006  . Peripheral vascular disease (HCC) 09/25/2006   Past Surgical History:  Procedure Laterality Date  . ENDOMETRIAL BIOPSY  2005  . femoral stents    . NASAL SINUS SURGERY    . TONSILLECTOMY    . TUBAL LIGATION     Social History  Substance Use Topics  . Smoking status: Current Every Day Smoker    Packs/day: 1.00    Years: 35.00  . Smokeless tobacco: Never Used  . Alcohol use No   Family History  Problem Relation Age of Onset  . Hypertension Mother   . Hypertension Father   . Heart disease Father   . Peripheral vascular disease Father   . Alcohol abuse Father   . Cancer Maternal Grandmother     BREAST  .  Stroke Maternal Grandmother     CEBERAL ATHEROSCLEROSIS  . Cancer - Ovarian Cousin   . Fibroids Sister      Current medication list and allergy/intolerance information reviewed:   Current Outpatient Prescriptions  Medication Sig Dispense Refill  . aspirin 325 MG tablet Take 325 mg by mouth. Reported on 03/18/2015    . Aspirin-Salicylamide-Caffeine (BC HEADACHE POWDER PO) Take by mouth as needed.    . Cholecalciferol (VITAMIN D3) 5000 units TABS Take by mouth. Reported on 03/18/2015    . COMBIVENT RESPIMAT 20-100 MCG/ACT AERS respimat inhale 1 puff by mouth every 6 hours  1  . Linaclotide (LINZESS) 145 MCG CAPS capsule Take 1 capsule (145 mcg total) by mouth daily. 30 capsule 1  . omeprazole (PRILOSEC) 40 MG capsule take 1 capsule by mouth once daily 1/2 HOUR BEFORE BREAKFAST  0  . promethazine (PHENERGAN) 12.5 MG tablet Take 1 tablet (12.5 mg total) by mouth every 6 (six) hours as needed for nausea or vomiting. Reported on 03/18/2015 30 tablet 2  . senna (SENOKOT) 8.6 MG tablet Take 2 tablets by mouth daily.     No current facility-administered medications for this visit.    Allergies  Allergen Reactions  . Doxycycline       Review of Systems:  Constitutional:  No  fever, no chills, +recent illness, No unintentional weight changes. +significant fatigue.   HEENT: No  headache, no vision change, no hearing change  Cardiac: No palpitations, No  Orthopnea  Respiratory:  +shortness of breath. +Cough  Gastrointestinal: No  abdominal pain, No  nausea, No  vomiting,  Musculoskeletal: No new myalgia/arthralgia  Neurologic: No  slurred speech/focal weakness/facial droop   Exam:  BP (!) 178/84   Pulse 94   Ht 5\' 5"  (1.651 m)   Wt 179 lb (81.2 kg)   BMI 29.79 kg/m   Orthostatic VS for the past 24 hrs:  BP- Lying Pulse- Lying BP- Sitting Pulse- Sitting BP- Standing at 0 minutes Pulse- Standing at 0 minutes  04/03/16 1522 138/82 87 144/84 91 157/84 94     Constitutional: VS see  above. General Appearance: alert, well-developed, well-nourished, NAD  Eyes: Normal lids and conjunctive, non-icteric sclera  Ears, Nose, Mouth, Throat: MMM, Normal external inspection ears/nares/mouth/lips/gums. TM normal bilaterally. Pharynx/tonsils no erythema, no exudate. Nasal mucosa normal.   Neck: No masses, trachea midline. No thyroid enlargement. No tenderness/mass appreciated. No lymphadenopathy  Respiratory: Normal respiratory effort. no wheeze, no rhonchi, no rales  Cardiovascular: S1/S2 normal, no murmur, no rub/gallop auscultated. RRR. No lower extremity edema.   Musculoskeletal: Gait normal. No clubbing/cyanosis of digits. Tenderness chest wall at intercostals ant/lateral below L breast  Neurological: Normal balance/coordination. No tremor.   Skin: warm, dry, intact. No rash/ulcer.  Psychiatric: Normal judgment/insight. Normal mood and affect. Oriented x3.     Dg Chest 2 View  Result Date: 04/03/2016 CLINICAL DATA:  Pain around the left breast with shortness of breath EXAM: CHEST  2 VIEW COMPARISON:  06/02/2015 FINDINGS: The heart size and mediastinal contours are within normal limits. Both lungs are clear. The visualized skeletal structures are unremarkable. Mild apical pleural and parenchymal scarring as before. IMPRESSION: No active cardiopulmonary disease. Electronically Signed   By: Jasmine PangKim  Fujinaga M.D.   On: 04/03/2016 19:50    EKG interpretation: Rate: 94  Rhythm: sinus No ST/T changes concerning for acute ischemia/infarct  No prior available to compare    ASSESSMENT/PLAN: lightheaded/fatigue d/t likely postviral syndrome w/ uncontrolled HTN, CXR andtreat BP, close followup, ER precautionsr eviewed in detail and consider stress test if no better  Postviral fatigue syndrome - Plan: CBC with Differential/Platelet, COMPLETE METABOLIC PANEL WITH GFR, TSH, VITAMIN D 25 Hydroxy (Vit-D Deficiency, Fractures)  Dizziness - Plan: EKG 12-Lead, CBC with  Differential/Platelet, COMPLETE METABOLIC PANEL WITH GFR, TSH, VITAMIN D 25 Hydroxy (Vit-D Deficiency, Fractures)  Short of breath on exertion - Plan: DG Chest 2 View  Essential hypertension - Plan: CBC with Differential/Platelet, COMPLETE METABOLIC PANEL WITH GFR, TSH, olmesartan (BENICAR) 20 MG tablet, DISCONTINUED: olmesartan (BENICAR) 20 MG tablet  Post-viral cough syndrome  Costochondritis - also likely intercostal strain d/t cough, NSAIDs and rest, recheck in clinic  Benign paroxysmal positional vertigo, unspecified laterality    Patient Instructions  PLAN:  Labs today  Xray of the chest today  Start BP medication at 1/2 dose for one week  See me in 1 week to recheck  Anything worse/changes, let me know right away!     Visit summary with medication list and pertinent instructions was printed for patient to review. All questions at time of visit were answered - patient instructed to contact office with any additional concerns. ER/RTC precautions were reviewed with the patient. Follow-up plan: Return in about 1 week (around 04/10/2016) for BP RECHECK .

## 2016-04-03 NOTE — Patient Instructions (Signed)
PLAN:  Labs today  Xray of the chest today  Start BP medication at 1/2 dose for one week  See me in 1 week to recheck  Anything worse/changes, let me know right away!

## 2016-04-04 LAB — COMPLETE METABOLIC PANEL WITH GFR
ALT: 13 U/L (ref 6–29)
AST: 14 U/L (ref 10–35)
Albumin: 3.6 g/dL (ref 3.6–5.1)
Alkaline Phosphatase: 92 U/L (ref 33–130)
BUN: 14 mg/dL (ref 7–25)
CO2: 27 mmol/L (ref 20–31)
CREATININE: 0.74 mg/dL (ref 0.50–1.05)
Calcium: 8.8 mg/dL (ref 8.6–10.4)
Chloride: 108 mmol/L (ref 98–110)
GFR, Est Non African American: >89 mL/min (ref >=60)
GLUCOSE: 67 mg/dL (ref 65–99)
Potassium: 3.4 mmol/L — ABNORMAL LOW (ref 3.5–5.3)
SODIUM: 144 mmol/L (ref 135–146)
Total Bilirubin: 0.4 mg/dL (ref 0.2–1.2)
Total Protein: 6 g/dL — ABNORMAL LOW (ref 6.1–8.1)

## 2016-04-04 LAB — VITAMIN D 25 HYDROXY (VIT D DEFICIENCY, FRACTURES): VIT D 25 HYDROXY: 24 ng/mL — AB (ref 30–100)

## 2016-04-04 LAB — TSH: TSH: 0.94 mIU/L

## 2016-04-10 ENCOUNTER — Ambulatory Visit: Payer: Federal, State, Local not specified - PPO

## 2016-04-12 ENCOUNTER — Ambulatory Visit (INDEPENDENT_AMBULATORY_CARE_PROVIDER_SITE_OTHER): Payer: Federal, State, Local not specified - PPO | Admitting: Osteopathic Medicine

## 2016-04-12 ENCOUNTER — Encounter: Payer: Self-pay | Admitting: Osteopathic Medicine

## 2016-04-12 VITALS — BP 150/83 | HR 97 | Ht 65.0 in | Wt 177.0 lb

## 2016-04-12 DIAGNOSIS — R1907 Generalized intra-abdominal and pelvic swelling, mass and lump: Secondary | ICD-10-CM | POA: Diagnosis not present

## 2016-04-12 DIAGNOSIS — I10 Essential (primary) hypertension: Secondary | ICD-10-CM | POA: Diagnosis not present

## 2016-04-12 DIAGNOSIS — R0602 Shortness of breath: Secondary | ICD-10-CM | POA: Diagnosis not present

## 2016-04-12 MED ORDER — OLMESARTAN MEDOXOMIL 20 MG PO TABS: 20.0000 mg | ORAL_TABLET | Freq: Every day | ORAL | 0 refills | Status: DC

## 2016-04-12 NOTE — Progress Notes (Signed)
HPI: Tina Navarro is a 56 y.o. female  who presents to [facility] today, 04/12/16,  for chief complaint of:  Chief Complaint  Patient presents with  . Follow-up    BLOOD PRESSURE     HTN: Went to chiropractor last week, was getting nauseous and was told high BP. Seen in office for postviral syndrome, 178/84 BP. Elevated BP in the office on 2 occasions at that point. EKG ok. BP better today after initiation of olmesartan but not <130/80 goal. Still experiencing occasional shortness of breath/wheezing, lightheadedness is a bit better.   Occasional SOB: Patient has had pulmonary function test in the past, does not want to do a stress test at this point.  Abdominal bloating/swelling: Patient states comes and goes, ongoing for several years. Not particularly painful. She attributes her to double deep breathing to swollen abdomen. Recent labs reviewed, normal LFT.  Past medical, surgical, social and family history reviewed: Patient Active Problem List   Diagnosis Date Noted  . Abnormal findings on esophagogastroduodenoscopy (EGD) 03/18/2016  . BPPV (benign paroxysmal positional vertigo) 06/28/2015  . Irritable bowel syndrome 04/01/2015  . Hyperlipidemia 04/01/2015  . Current tobacco use 07/28/2012  . Multinodular goiter 12/19/2011  . CN (constipation) 07/28/2011  . Dyslipidemia 07/28/2011  . HYPERLIPIDEMIA 12/16/2009  . Acid reflux 09/25/2006  . Peripheral vascular disease (HCC) 09/25/2006   Past Surgical History:  Procedure Laterality Date  . ENDOMETRIAL BIOPSY  2005  . femoral stents    . NASAL SINUS SURGERY    . TONSILLECTOMY    . TUBAL LIGATION     Social History  Substance Use Topics  . Smoking status: Current Every Day Smoker    Packs/day: 1.00    Years: 35.00  . Smokeless tobacco: Never Used  . Alcohol use No   Family History  Problem Relation Age of Onset  . Hypertension Mother   . Hypertension Father   . Heart disease Father    . Peripheral vascular disease Father   . Alcohol abuse Father   . Cancer Maternal Grandmother     BREAST  . Stroke Maternal Grandmother     CEBERAL ATHEROSCLEROSIS  . Cancer - Ovarian Cousin   . Fibroids Sister      Current medication list and allergy/intolerance information reviewed:   Current Outpatient Prescriptions  Medication Sig Dispense Refill  . aspirin 325 MG tablet Take 325 mg by mouth. Reported on 03/18/2015    . Aspirin-Salicylamide-Caffeine (BC HEADACHE POWDER PO) Take by mouth as needed.    . Cholecalciferol (VITAMIN D3) 5000 units TABS Take by mouth. Reported on 03/18/2015    . Linaclotide (LINZESS) 145 MCG CAPS capsule Take 1 capsule (145 mcg total) by mouth daily. 30 capsule 1  . olmesartan (BENICAR) 20 MG tablet Take 0.5 tablets (10 mg total) by mouth daily. 30 tablet 0  . omeprazole (PRILOSEC) 40 MG capsule take 1 capsule by mouth once daily 1/2 HOUR BEFORE BREAKFAST  0  . promethazine (PHENERGAN) 12.5 MG tablet Take 1 tablet (12.5 mg total) by mouth every 6 (six) hours as needed for nausea or vomiting. Reported on 03/18/2015 30 tablet 2  . senna (SENOKOT) 8.6 MG tablet Take 2 tablets by mouth daily.     No current facility-administered medications for this visit.    Allergies  Allergen Reactions  . Doxycycline       Review of Systems:  Constitutional:  No  fever, no chills, +recent illness, No unintentional weight changes. +significant fatigue.  HEENT: No  headache, no vision change, no hearing change  Cardiac: No palpitations/orthopnea  Respiratory:  +shortness of breath. +Cough  Gastrointestinal: No  abdominal pain, No  nausea, No  Vomiting, + bloating/swelling  Musculoskeletal: No new myalgia/arthralgia  Neurologic: No  slurred speech/focal weakness/facial droop   Exam:  BP (!) 150/83   Pulse 97   Ht 5\' 5"  (1.651 m)   Wt 177 lb (80.3 kg)   LMP  (LMP Unknown)   BMI 29.45 kg/m   No data found.   Constitutional: VS see above. General  Appearance: alert, well-developed, well-nourished, NAD  Eyes: Normal lids and conjunctive, non-icteric sclera  Ears, Nose, Mouth, Throat: MMM, Normal external inspection  Neck: No masses, trachea midline.   Respiratory: Normal respiratory effort. no wheeze, no rhonchi, no rales  Cardiovascular: S1/S2 normal, no murmur, no rub/gallop auscultated. RRR. No lower extremity edema.   Musculoskeletal: Gait normal. No clubbing/cyanosis of digits.   Abdominal: No significant distention, questionable fluid wave on exam, normal bowel sounds 4, nontender, nondistended. No appreciable hepatosplenomegaly, obesity limits exam  Neurological: Normal balance/coordination. No tremor.   Skin: warm, dry, intact. No rash/ulcer.  Psychiatric: Normal judgment/insight. Normal mood and affect. Oriented x3.     ASSESSMENT/PLAN: Go ahead and increase to full tablet of blood pressure medication. Plan to repeat labs in another 6 weeks or so. Advised may need to consider stress test or repeat PFT. Abdominal ultrasound for further evaluate patient of bloating symptom, particularly as patient feels this is contributing to her shortness of breath  Abdominal swelling, generalized - Plan: US Abdomen Complete  Essential hypertension - Plan: olmesartan (BENICAR) 20 MG tablet  Short of breath on exertion    Visit summary with medication list and pertinent instructions was printed for patient to review. All questions at time of visit were answered - patient instructed to contact office with any additional concerns. ER/RTC precautions were reviewed with the patient. Follow-up plan: Return in about 4 weeks (around 05/10/2016) for BP follow-up no later than 4 weeks .

## 2016-04-18 ENCOUNTER — Ambulatory Visit (INDEPENDENT_AMBULATORY_CARE_PROVIDER_SITE_OTHER): Payer: Federal, State, Local not specified - PPO

## 2016-04-18 DIAGNOSIS — K819 Cholecystitis, unspecified: Secondary | ICD-10-CM

## 2016-04-18 DIAGNOSIS — K760 Fatty (change of) liver, not elsewhere classified: Secondary | ICD-10-CM | POA: Diagnosis not present

## 2016-04-18 DIAGNOSIS — R140 Abdominal distension (gaseous): Secondary | ICD-10-CM | POA: Diagnosis not present

## 2016-05-01 ENCOUNTER — Ambulatory Visit (INDEPENDENT_AMBULATORY_CARE_PROVIDER_SITE_OTHER): Payer: Federal, State, Local not specified - PPO | Admitting: Osteopathic Medicine

## 2016-05-01 ENCOUNTER — Encounter: Payer: Self-pay | Admitting: Osteopathic Medicine

## 2016-05-01 VITALS — BP 125/75 | HR 96 | Ht 65.0 in | Wt 175.1 lb

## 2016-05-01 DIAGNOSIS — B351 Tinea unguium: Secondary | ICD-10-CM

## 2016-05-01 DIAGNOSIS — M7632 Iliotibial band syndrome, left leg: Secondary | ICD-10-CM

## 2016-05-01 DIAGNOSIS — I10 Essential (primary) hypertension: Secondary | ICD-10-CM

## 2016-05-01 MED ORDER — OLMESARTAN MEDOXOMIL 20 MG PO TABS: 20.0000 mg | ORAL_TABLET | Freq: Every day | ORAL | 3 refills | Status: DC

## 2016-05-01 NOTE — Progress Notes (Signed)
HPI: Tina Navarro is a 56 y.o. female  who [facility] [place], 05/01/16,  for chief complaint of:  Chief Complaint  Patient presents with  . Hypertension   HTN: 150/83 at last visit. We increased to full tablet olmesartan. Initial BP today mild elevation but confirmation on manual cuff was normal range. No chest pain, pressure, shortness of breath. Patient is overall feeling a lot better since the blood pressure has improved.  MSK pain: L side of the leg x1 week. Hx statin intolerance "had affected the IT muscle." No injury or fall. No rash. Soreness but no sharp pain or locking.   Toenail: thickened, starting to be painful on the medial aspect of L great toe.    Past medical history, surgical history, social history and family history reviewed.  Patient Active Problem List   Diagnosis Date Noted  . Abnormal findings on esophagogastroduodenoscopy (EGD) 03/18/2016  . BPPV (benign paroxysmal positional vertigo) 06/28/2015  . Irritable bowel syndrome 04/01/2015  . Hyperlipidemia 04/01/2015  . Current tobacco use 07/28/2012  . Multinodular goiter 12/19/2011  . CN (constipation) 07/28/2011  . Dyslipidemia 07/28/2011  . HYPERLIPIDEMIA 12/16/2009  . Acid reflux 09/25/2006  . Peripheral vascular disease (HCC) 09/25/2006    Current medication list and allergy/intolerance information reviewed.   Current Outpatient Prescriptions on File Prior to Visit  Medication Sig Dispense Refill  . aspirin 325 MG tablet Take 325 mg by mouth. Reported on 03/18/2015    . Aspirin-Salicylamide-Caffeine (BC HEADACHE POWDER PO) Take by mouth as needed.    . Cholecalciferol (VITAMIN D3) 5000 units TABS Take by mouth. Reported on 03/18/2015    . Linaclotide (LINZESS) 145 MCG CAPS capsule Take 1 capsule (145 mcg total) by mouth daily. 30 capsule 1  . olmesartan (BENICAR) 20 MG tablet Take 1 tablet (20 mg total) by mouth daily. 30 tablet 0  . omeprazole (PRILOSEC)  40 MG capsule take 1 capsule by mouth once daily 1/2 HOUR BEFORE BREAKFAST  0  . promethazine (PHENERGAN) 12.5 MG tablet Take 1 tablet (12.5 mg total) by mouth every 6 (six) hours as needed for nausea or vomiting. Reported on 03/18/2015 30 tablet 2  . senna (SENOKOT) 8.6 MG tablet Take 2 tablets by mouth daily.     No current facility-administered medications on file prior to visit.    Allergies  Allergen Reactions  . Doxycycline       Review of Systems:  Constitutional: No recent illness  Cardiac: No  chest pain, No  pressure  Respiratory:  No  shortness of breath. No  Cough  Gastrointestinal: No  abdominal pain, no change on bowel habits  Musculoskeletal: +new myalgia/arthralgia  Skin: No  Rash, +nail changes as per HPI  Neurologic: No  weakness, No  Dizziness   Exam:  BP 125/75   Pulse 96   Ht 5\' 5"  (1.651 m)   Wt 175 lb 1.9 oz (79.4 kg)   LMP  (LMP Unknown)   SpO2 100%   BMI 29.14 kg/m   Constitutional: VS see above. General Appearance: alert, well-developed, well-nourished, NAD  Eyes: Normal lids and conjunctive, non-icteric sclera  Ears, Nose, Mouth, Throat: MMM, Normal external inspection ears/nares/mouth/lips/gums.  Neck: No masses, trachea midline.   Respiratory: Normal respiratory effort. no wheeze, no rhonchi, no rales  Cardiovascular: S1/S2 normal, no murmur, no rub/gallop auscultated. RRR.   Musculoskeletal: Gait normal. Symmetric and independent movement of all extremities, no significant tenderness at the greater trochanter, negative straight leg  raise bilaterally,  Neurological: Normal balance/coordination. No tremor.  Skin: warm, dry, intact.   Psychiatric: Normal judgment/insight. Normal mood and affect. Oriented x3.      ASSESSMENT/PLAN:   Essential hypertension - Plan: olmesartan (BENICAR) 20 MG tablet, BASIC METABOLIC PANEL WITH GFR  Onychomycosis - Declines medication treatment at this time, will consider podiatry referral. -  Plan: Ambulatory referral to Podiatry  It band syndrome, left - Possible IT band syndrome versus hip abductor tendinitis, consider sports med follow-up if no better with home exercises and anti-inflammatories    Follow-up plan: Return for lab draw after trip to [place], after that 4-6 months for recheck BP.  Visit summary with medication list and pertinent instructions was printed for patient to review, alert [place] if any changes needed. All questions at time of visit were answered - patient instructed to contact office with any additional concerns. ER/RTC precautions were reviewed with the patient and understanding verbalized.

## 2016-07-26 DIAGNOSIS — I10 Essential (primary) hypertension: Secondary | ICD-10-CM | POA: Diagnosis not present

## 2016-07-27 LAB — BASIC METABOLIC PANEL WITH GFR
BUN: 17 mg/dL (ref 7–25)
CHLORIDE: 109 mmol/L (ref 98–110)
CO2: 21 mmol/L (ref 20–31)
CREATININE: 0.9 mg/dL (ref 0.50–1.05)
Calcium: 8.9 mg/dL (ref 8.6–10.4)
GFR, EST AFRICAN AMERICAN: 83 mL/min (ref >=60)
GFR, Est Non African American: 72 mL/min (ref >=60)
Glucose, Bld: 100 mg/dL — ABNORMAL HIGH (ref 65–99)
Potassium: 4.2 mmol/L (ref 3.5–5.3)
Sodium: 141 mmol/L (ref 135–146)

## 2016-07-31 DIAGNOSIS — K080 Exfoliation of teeth due to systemic causes: Secondary | ICD-10-CM | POA: Diagnosis not present

## 2016-11-08 ENCOUNTER — Other Ambulatory Visit: Payer: Self-pay | Admitting: Osteopathic Medicine

## 2017-02-15 ENCOUNTER — Other Ambulatory Visit: Payer: Self-pay | Admitting: Osteopathic Medicine

## 2017-02-15 DIAGNOSIS — I10 Essential (primary) hypertension: Secondary | ICD-10-CM

## 2017-04-22 ENCOUNTER — Ambulatory Visit (HOSPITAL_BASED_OUTPATIENT_CLINIC_OR_DEPARTMENT_OTHER)
Admission: RE | Admit: 2017-04-22 | Discharge: 2017-04-22 | Disposition: A | Discharge status: Home / Self Care | Payer: Federal, State, Local not specified - PPO | Source: Ambulatory Visit | Attending: Physician Assistant | Admitting: Physician Assistant

## 2017-04-22 ENCOUNTER — Ambulatory Visit: Payer: Federal, State, Local not specified - PPO | Admitting: Physician Assistant

## 2017-04-22 ENCOUNTER — Ambulatory Visit (INDEPENDENT_AMBULATORY_CARE_PROVIDER_SITE_OTHER): Payer: Federal, State, Local not specified - PPO

## 2017-04-22 ENCOUNTER — Encounter: Payer: Self-pay | Admitting: Physician Assistant

## 2017-04-22 VITALS — BP 147/80 | HR 90 | Temp 98.0°F | Wt 173.0 lb

## 2017-04-22 DIAGNOSIS — I700 Atherosclerosis of aorta: Secondary | ICD-10-CM | POA: Diagnosis not present

## 2017-04-22 DIAGNOSIS — R079 Chest pain, unspecified: Secondary | ICD-10-CM | POA: Diagnosis not present

## 2017-04-22 DIAGNOSIS — E041 Nontoxic single thyroid nodule: Secondary | ICD-10-CM | POA: Insufficient documentation

## 2017-04-22 DIAGNOSIS — I10 Essential (primary) hypertension: Secondary | ICD-10-CM

## 2017-04-22 DIAGNOSIS — G5793 Unspecified mononeuropathy of bilateral lower limbs: Secondary | ICD-10-CM

## 2017-04-22 DIAGNOSIS — R7989 Other specified abnormal findings of blood chemistry: Secondary | ICD-10-CM

## 2017-04-22 DIAGNOSIS — J439 Emphysema, unspecified: Secondary | ICD-10-CM | POA: Insufficient documentation

## 2017-04-22 DIAGNOSIS — Z131 Encounter for screening for diabetes mellitus: Secondary | ICD-10-CM

## 2017-04-22 DIAGNOSIS — R0781 Pleurodynia: Secondary | ICD-10-CM | POA: Diagnosis not present

## 2017-04-22 DIAGNOSIS — Z1329 Encounter for screening for other suspected endocrine disorder: Secondary | ICD-10-CM

## 2017-04-22 DIAGNOSIS — I739 Peripheral vascular disease, unspecified: Secondary | ICD-10-CM

## 2017-04-22 LAB — LIPID PANEL W/REFLEX DIRECT LDL
CHOL/HDL RATIO: 5.8 (calc) — AB (ref <5.0)
Cholesterol: 197 mg/dL (ref <200)
HDL: 34 mg/dL — ABNORMAL LOW (ref >50)
LDL Cholesterol (Calc): 113 mg/dL (calc) — ABNORMAL HIGH
NON-HDL CHOLESTEROL (CALC): 163 mg/dL — AB (ref <130)
TRIGLYCERIDES: 352 mg/dL — AB (ref <150)

## 2017-04-22 LAB — D-DIMER, QUANTITATIVE: D-Dimer, Quant: 0.69 mcg/mL FEU — ABNORMAL HIGH (ref <0.50)

## 2017-04-22 MED ORDER — IOPAMIDOL (ISOVUE-370) INJECTION 76%
100.0000 mL | Freq: Once | INTRAVENOUS | Status: AC | PRN
  Administered 2017-04-22: 68 mL via INTRAVENOUS

## 2017-04-22 MED ORDER — ATORVASTATIN CALCIUM 40 MG PO TABS: 40.0000 mg | ORAL_TABLET | Freq: Every day | ORAL | 3 refills | Status: DC

## 2017-04-22 NOTE — Progress Notes (Signed)
HPI:                                                                Tina Navarro is a 57 y.o. female who presents to [facility]: Primary Care Sports Medicine today for left-sided rib/back pain  Pleasant 57 yo F with PMH of HTN, PVD s/p b/l femoral stents, tobacco use, GERD who presents with left-sided rib pain radiating to her scapular area and left breast/chest wall x 1 week. She is able to reproduce the pain with palpation, but states it is also worse with a deep inhalation. She reports this was preceded by about 1 month of cough. She does endorse some dyspnea. Denies exertional chest pain or claudication. Denies history of PE/DVT. She currently smokes approximately 1 ppd. Denies constitutional symptoms, abdominal pain, change in bowel habits, dysuria, urgency or frequency.  She also complains of approximately 2 weeks of bilateral burning pain and paresthesias in her toes. Denies claudication.    Depression screen PHQ 2/9 05/01/2016  Decreased Interest 0  Down, Depressed, Hopeless 0  PHQ - 2 Score 0    GAD 7 : Generalized Anxiety Score 04/22/2015  Nervous, Anxious, on Edge 3  Control/stop worrying 1  Worry too much - different things 1  Trouble relaxing 3  Restless 2  Easily annoyed or irritable 3  Afraid - awful might happen 0  Total GAD 7 Score 13  Anxiety Difficulty Somewhat difficult      Past Medical History:  Diagnosis Date  . Emphysematous COPD (HCC) 04/23/2017  . GERD (gastroesophageal reflux disease)   . Hyperlipidemia   . Thyroid nodule 04/23/2017   Past Surgical History:  Procedure Laterality Date  . ENDOMETRIAL BIOPSY  2005  . FEMORAL ARTERY STENT Bilateral 2008  . NASAL SINUS SURGERY    . TONSILLECTOMY    . TUBAL LIGATION     Social History   Tobacco Use  . Smoking status: Current Every Day Smoker    Packs/day: 1.00    Years: 35.00    Pack years: 35.00    Types: Cigarettes  . Smokeless tobacco: Never Used  Substance Use Topics   . Alcohol use: No   family history includes Alcohol abuse in her father; Cancer in her maternal grandmother; Cancer - Ovarian in her cousin; Fibroids in her sister; Heart disease in her father; Hypertension in her father and mother; Peripheral vascular disease in her father; Stroke in her maternal grandmother.    ROS: negative except as noted in the HPI  Medications: Current Outpatient Medications  Medication Sig Dispense Refill  . aspirin 325 MG tablet Take 325 mg by mouth. Reported on 03/18/2015    . Aspirin-Salicylamide-Caffeine (BC HEADACHE POWDER PO) Take by mouth as needed.    . Cholecalciferol (VITAMIN D3) 5000 units TABS Take by mouth. Reported on 03/18/2015    . Linaclotide (LINZESS) 145 MCG CAPS capsule Take 1 capsule (145 mcg total) by mouth daily. 30 capsule 1  . olmesartan (BENICAR) 20 MG tablet Take 1 tablet (20 mg total) by mouth daily. Patient due for follow up 30 tablet 0  . omeprazole (PRILOSEC) 40 MG capsule TAKE ONE CAPSULE TWICE DAILY 60 capsule 0  . promethazine (PHENERGAN) 12.5 MG tablet Take 1 tablet (12.5 mg total) by  mouth every 6 (six) hours as needed for nausea or vomiting. Reported on 03/18/2015 30 tablet 2  . senna (SENOKOT) 8.6 MG tablet Take 2 tablets by mouth daily.    Marland Kitchen atorvastatin (LIPITOR) 40 MG tablet Take 1 tablet (40 mg total) by mouth at bedtime. 90 tablet 3   No current facility-administered medications for this visit.    Allergies  Allergen Reactions  . Doxycycline        Objective:  BP (!) 147/80 (BP Location: Right Arm, Cuff Size: Normal)   Pulse 90   Temp 98 F (36.7 C) (Oral)   Wt 173 lb (78.5 kg)   LMP  (LMP Unknown)   SpO2 97%   BMI 28.79 kg/m  Gen:  alert, not ill-appearing, no distress, appropriate for age HEENT: head normocephalic without obvious abnormality, conjunctiva and cornea clear, trachea midline Pulm: Normal work of breathing, normal phonation, clear to auscultation bilaterally, no wheezes, rales or rhonchi CV:  Normal rate, regular rhythm, s1 and s2 distinct, no murmurs, clicks or rubs  Neuro: alert and oriented x 3, loss of vibratory sensation in bilateral feet, intact to monofilament testing MSK: reproducible tenderness of the left lower rib area, extremities atraumatic, calves nontender, normal gait and station Skin: intact, no rashes on exposed skin, no jaundice, no cyanosis, diminished capillary refill of toes bilaterally, distal aspects of bilateral toes with erythema Psych: well-groomed, cooperative, good eye contact, euthymic mood, affect mood-congruent, speech is articulate, and thought processes clear and goal-directed    Results for orders placed or performed in visit on 04/22/17 (from the past 72 hour(s))  D-dimer, quantitative (not at [facility])     Status: Abnormal   Collection Time: 04/22/17  1:51 PM  Result Value Ref Range   D-Dimer, Quant 0.69 (H) <0.50 mcg/mL FEU    Comment: . The D-Dimer test is used frequently to exclude an acute PE or DVT. In patients with a low to moderate clinical risk assessment and a D-Dimer result <0.50 mcg/mL FEU, the likelihood of a PE or DVT is very low. However, a thromboembolic event should not be excluded solely on the basis of the D-Dimer level. Increased levels of D-Dimer are associated with a PE, DVT, DIC, malignancies, inflammation, sepsis, surgery, trauma, pregnancy, and advancing patient age. [Jama 2006 11:295(2):199-207] . For additional information, please refer to: http://education.[facility].com/faq/FAQ149 (This link is being provided for informational/ educational purposes only) .   Lipid Panel w/reflex Direct LDL     Status: Abnormal   Collection Time: 04/22/17  1:53 PM  Result Value Ref Range   Cholesterol 197 <200 mg/dL   HDL 34 (L) >95 mg/dL   Triglycerides 621 (H) <150 mg/dL   LDL Cholesterol (Calc) 113 (H) mg/dL (calc)    Comment: Reference range: <100 . Desirable range <100 mg/dL for primary prevention;   <70 mg/dL  for patients with CHD or diabetic patients  with > or = 2 CHD risk factors. Marland Kitchen LDL-C is now calculated using the Martin-Hopkins  calculation, which is a validated novel method providing  better accuracy than the Friedewald equation in the  estimation of LDL-C.  Horald Pollen et al. Lenox Ahr. 3086;578(46): 2061-2068  (http://education.[facility].com/faq/FAQ164)    Total CHOL/HDL Ratio 5.8 (H) <5.0 (calc)   Non-HDL Cholesterol (Calc) 163 (H) <130 mg/dL (calc)    Comment: For patients with diabetes plus 1 major ASCVD risk  factor, treating to a non-HDL-C goal of <100 mg/dL  (LDL-C of <96 mg/dL) is considered a therapeutic  option.   Vitamin  B12     Status: None   Collection Time: 04/22/17  2:04 PM  Result Value Ref Range   Vitamin B-12 214 200 - 1,100 pg/mL    Comment: . Please Note: Although the reference range for vitamin B12 is [phone] pg/mL, it has been reported that between 5 and 10% of patients with values between 200 and 400 pg/mL may experience neuropsychiatric and hematologic abnormalities due to occult B12 deficiency; less than 1% of patients with values above 400 pg/mL will have symptoms. .   CBC with Differential/Platelet     Status: None   Collection Time: 04/22/17  2:04 PM  Result Value Ref Range   WBC 8.1 3.8 - 10.8 Thousand/uL   RBC 4.67 3.80 - 5.10 Million/uL   Hemoglobin 14.1 11.7 - 15.5 g/dL   HCT 16.1 09.6 - 04.5 %   MCV 85.0 80.0 - 100.0 fL   MCH 30.2 27.0 - 33.0 pg   MCHC 35.5 32.0 - 36.0 g/dL   RDW 40.9 81.1 - 91.4 %   Platelets 329 140 - 400 Thousand/uL   MPV 9.3 7.5 - 12.5 fL   Neutro Abs 4,674 1,500 - 7,800 cells/uL   Lymphs Abs 2,657 850 - 3,900 cells/uL   WBC mixed population 421 200 - 950 cells/uL   Eosinophils Absolute 300 15 - 500 cells/uL   Basophils Absolute 49 0 - 200 cells/uL   Neutrophils Relative % 57.7 %   Total Lymphocyte 32.8 %   Monocytes Relative 5.2 %   Eosinophils Relative 3.7 %   Basophils Relative 0.6 %  Comprehensive  metabolic panel     Status: None   Collection Time: 04/22/17  2:04 PM  Result Value Ref Range   Glucose, Bld 87 65 - 99 mg/dL    Comment: .            Fasting reference interval .    BUN 13 7 - 25 mg/dL   Creat 7.82 9.56 - 2.13 mg/dL    Comment: For patients >15 years of age, the reference limit for Creatinine is approximately 13% higher for people identified as African-American. .    BUN/Creatinine Ratio NOT APPLICABLE 6 - 22 (calc)   Sodium 143 135 - 146 mmol/L   Potassium 4.1 3.5 - 5.3 mmol/L   Chloride 107 98 - 110 mmol/L   CO2 26 20 - 32 mmol/L   Calcium 9.3 8.6 - 10.4 mg/dL   Total Protein 6.7 6.1 - 8.1 g/dL   Albumin 4.3 3.6 - 5.1 g/dL   Globulin 2.4 1.9 - 3.7 g/dL (calc)   AG Ratio 1.8 1.0 - 2.5 (calc)   Total Bilirubin 0.4 0.2 - 1.2 mg/dL   Alkaline phosphatase (APISO) 112 33 - 130 U/L   AST 16 10 - 35 U/L   ALT 14 6 - 29 U/L  TSH + free T4     Status: None   Collection Time: 04/22/17  2:04 PM  Result Value Ref Range   TSH W/REFLEX TO FT4 1.44 0.40 - 4.50 mIU/L  Hemoglobin A1c     Status: None   Collection Time: 04/22/17  2:04 PM  Result Value Ref Range   Hgb A1c MFr Bld 5.3 <5.7 % of total Hgb    Comment: For the purpose of screening for the presence of diabetes: . <5.7%       Consistent with the absence of diabetes 5.7-6.4%    Consistent with increased risk for diabetes             (  prediabetes) > or =6.5%  Consistent with diabetes . This assay result is consistent with a decreased risk of diabetes. . Currently, no consensus exists regarding use of hemoglobin A1c for diagnosis of diabetes in children. . According to American Diabetes Association (ADA) guidelines, hemoglobin A1c <7.0% represents optimal control in non-pregnant diabetic patients. Different metrics may apply to specific patient populations.  Standards of Medical Care in Diabetes(ADA). .    Mean Plasma Glucose 105 (calc)   eAG (mmol/L) 5.8 (calc)   Dg Chest 2 View  Result Date:  04/22/2017 CLINICAL DATA:  Left lateral rib pain and pleuritic chest pain. EXAM: CHEST  2 VIEW COMPARISON:  04/03/2016 FINDINGS: The heart size and mediastinal contours are within normal limits. Stable scarring at both lung apices. There is no evidence of pulmonary edema, consolidation, pneumothorax, nodule or pleural fluid. The visualized skeletal structures are unremarkable. IMPRESSION: No active cardiopulmonary disease. Electronically Signed   By: Irish Lack M.D.   On: 04/22/2017 14:33   Ct Angio Chest Pe W Or Wo Contrast  Result Date: 04/22/2017 CLINICAL DATA:  Chest pain. EXAM: CT ANGIOGRAPHY CHEST WITH CONTRAST TECHNIQUE: Multidetector CT imaging of the chest was performed using the standard protocol during bolus administration of intravenous contrast. Multiplanar CT image reconstructions and MIPs were obtained to evaluate the vascular anatomy. CONTRAST:  68mL ISOVUE-370 IOPAMIDOL (ISOVUE-370) INJECTION 76% COMPARISON:  Radiographs of same day. FINDINGS: Cardiovascular: Satisfactory opacification of the pulmonary arteries to the segmental level. No evidence of pulmonary embolism. Normal heart size. No pericardial effusion. Atherosclerosis of thoracic aorta is noted without aneurysm formation. Mediastinum/Nodes: 2 cm left thyroid nodule is noted. Esophagus is unremarkable. No adenopathy is noted. Lungs/Pleura: Biapical scarring is noted. No pneumothorax or pleural effusion is noted. Mild emphysematous disease is noted in the upper lobes. Upper Abdomen: No acute abnormality. Musculoskeletal: No chest wall abnormality. No acute or significant osseous findings. Review of the MIP images confirms the above findings. IMPRESSION: No definite evidence of pulmonary embolus. 2 cm left thyroid nodule is noted. Thyroid ultrasound is recommended for further evaluation. Aortic Atherosclerosis (ICD10-I70.0) and Emphysema (ICD10-J43.9). Electronically Signed   By: Lupita Raider, M.D.   On: 04/22/2017 19:49       Assessment and Plan: 58 y.o. female with   1. Pleuritic chest pain - reproducible left sided chest wall pain preceded by 1 month of cough. She does have pleuritic pain as well as known PVD/cardiovascular risk factors. For this reason, will order d-dimer to rule out PE. Vital signs reviewed and stable - D-dimer, quantitative (not at [facility]) - DG Chest 2 View - CT ANGIO CHEST PE W OR WO CONTRAST  2. Peripheral vascular disease (HCC) - Ambulatory referral to Vascular Surgery for surveillance - Lipid Panel w/reflex Direct LDL - atorvastatin (LIPITOR) 40 MG tablet; Take 1 tablet (40 mg total) by mouth at bedtime.  Dispense: 90 tablet; Refill: 3  3. Neuropathy involving both lower extremities - loss of vibratory sensation on exam. Laboratory work-up to rule out B12 deficiency, diabetes, anemia, thyroid disease - Vitamin B12 - CBC with Differential/Platelet - Comprehensive metabolic panel - TSH + free T4 - Hemoglobin A1c  4. Screening for thyroid disorder - TSH + free T4  5. Screening for diabetes mellitus - Hemoglobin A1c  6. Rib pain on left side - DG Chest 2 View  7. Essential hypertension BP Readings from Last 3 Encounters:  04/22/17 (!) 147/80  05/01/16 125/75  04/12/16 (!) 150/83  - BP out of range. Patient  is concerned that her Olmesartan is causing a cough - counseled on therapeutic lifestyle changes. Continue ARB for now. Follow-up with PCP in 1 week   8. Positive D-dimer - PERC rule 1 criteria. Vital signs are stable, SpO2 97% on RA. CT [facility] ANGIO CHEST PE W OR WO CONTRAST negative for PE  Patient education and anticipatory guidance given Patient agrees with treatment plan Follow-up in 1 week with PCP or sooner as needed if symptoms worsen or fail to improve   Levonne Hubertharley E. Cummings PA-C

## 2017-04-22 NOTE — Patient Instructions (Signed)
Peripheral Neuropathy Peripheral neuropathy is a type of nerve damage. It affects nerves that carry signals between the spinal cord and other parts of the body. These are called peripheral nerves. With peripheral neuropathy, one nerve or a group of nerves may be damaged. What are the causes? Many things can damage peripheral nerves. For some people with peripheral neuropathy, the cause is unknown. Some causes include:  Diabetes. This is the most common cause of peripheral neuropathy.  Injury to a nerve.  Pressure or stress on a nerve that lasts a long time.  Too little vitamin B. Alcoholism can lead to this.  Infections.  Autoimmune diseases, such as multiple sclerosis and systemic lupus erythematosus.  Inherited nerve diseases.  Some medicines, such as cancer drugs.  Toxic substances, such as lead and mercury.  Too little blood flowing to the legs.  Kidney disease.  Thyroid disease.  What are the signs or symptoms? Different people have different symptoms. The symptoms you have will depend on which of your nerves is damaged. Common symptoms include:  Loss of feeling (numbness) in the feet and hands.  Tingling in the feet and hands.  Pain that burns.  Very sensitive skin.  Weakness.  Not being able to move a part of the body (paralysis).  Muscle twitching.  Clumsiness or poor coordination.  Loss of balance.  Not being able to control your bladder.  Feeling dizzy.  Sexual problems.  How is this diagnosed? Peripheral neuropathy is a symptom, not a disease. Finding the cause of peripheral neuropathy can be hard. To figure that out, your health care provider will take a medical history and do a physical exam. A neurological exam will also be done. This involves checking things affected by your brain, spinal cord, and nerves (nervous system). For example, your health care provider will check your reflexes, how you move, and what you can feel. Other types of tests  may also be ordered, such as:  Blood tests.  A test of the fluid in your spinal cord.  Imaging tests, such as CT scans or an MRI.  Electromyography (EMG). This test checks the nerves that control muscles.  Nerve conduction velocity tests. These tests check how fast messages pass through your nerves.  Nerve biopsy. A small piece of nerve is removed. It is then checked under a microscope.  How is this treated?  Medicine is often used to treat peripheral neuropathy. Medicines may include: ? Pain-relieving medicines. Prescription or over-the-counter medicine may be suggested. ? Antiseizure medicine. This may be used for pain. ? Antidepressants. These also may help ease pain from neuropathy. ? Lidocaine. This is a numbing medicine. You might wear a patch or be given a shot. ? Mexiletine. This medicine is typically used to help control irregular heart rhythms.  Surgery. Surgery may be needed to relieve pressure on a nerve or to destroy a nerve that is causing pain.  Physical therapy to help movement.  Assistive devices to help movement. Follow these instructions at home:  Only take over-the-counter or prescription medicines as directed by your health care provider. Follow the instructions carefully for any given medicines. Do not take any other medicines without first getting approval from your health care provider.  If you have diabetes, work closely with your health care provider to keep your blood sugar under control.  If you have numbness in your feet: ? Check every day for signs of injury or infection. Watch for redness, warmth, and swelling. ? Wear padded socks and comfortable   shoes. These help protect your feet.  Do not do things that put pressure on your damaged nerve.  Do not smoke. Smoking keeps blood from getting to damaged nerves.  Avoid or limit alcohol. Too much alcohol can cause a lack of B vitamins. These vitamins are needed for healthy nerves.  Develop a good  support system. Coping with peripheral neuropathy can be stressful. Talk to a mental health specialist or join a support group if you are struggling.  Follow up with your health care provider as directed. Contact a health care provider if:  You have new signs or symptoms of peripheral neuropathy.  You are struggling emotionally from dealing with peripheral neuropathy.  You have a fever. Get help right away if:  You have an injury or infection that is not healing.  You feel very dizzy or begin vomiting.  You have chest pain.  You have trouble breathing. This information is not intended to replace advice given to you by your health care provider. Make sure you discuss any questions you have with your health care provider. Document Released: 02/02/2002 Document Revised: 07/21/2015 Document Reviewed: 10/20/2012 Elsevier Interactive Patient Education  2017 [employer]vier Inc.  

## 2017-04-23 ENCOUNTER — Encounter: Payer: Self-pay | Admitting: Physician Assistant

## 2017-04-23 ENCOUNTER — Other Ambulatory Visit: Payer: Self-pay | Admitting: Sports Medicine

## 2017-04-23 DIAGNOSIS — J439 Emphysema, unspecified: Secondary | ICD-10-CM | POA: Insufficient documentation

## 2017-04-23 DIAGNOSIS — N952 Postmenopausal atrophic vaginitis: Secondary | ICD-10-CM | POA: Diagnosis not present

## 2017-04-23 DIAGNOSIS — E042 Nontoxic multinodular goiter: Secondary | ICD-10-CM

## 2017-04-23 DIAGNOSIS — R0781 Pleurodynia: Secondary | ICD-10-CM | POA: Insufficient documentation

## 2017-04-23 DIAGNOSIS — Z1231 Encounter for screening mammogram for malignant neoplasm of breast: Secondary | ICD-10-CM | POA: Diagnosis not present

## 2017-04-23 DIAGNOSIS — E041 Nontoxic single thyroid nodule: Secondary | ICD-10-CM | POA: Insufficient documentation

## 2017-04-23 DIAGNOSIS — Z6828 Body mass index (BMI) 28.0-28.9, adult: Secondary | ICD-10-CM | POA: Diagnosis not present

## 2017-04-23 DIAGNOSIS — Z01419 Encounter for gynecological examination (general) (routine) without abnormal findings: Secondary | ICD-10-CM | POA: Diagnosis not present

## 2017-04-23 HISTORY — DX: Emphysema, unspecified: J43.9

## 2017-04-23 HISTORY — DX: Nontoxic single thyroid nodule: E04.1

## 2017-04-23 LAB — COMPREHENSIVE METABOLIC PANEL
AG RATIO: 1.8 (calc) (ref 1.0–2.5)
ALBUMIN MSPROF: 4.3 g/dL (ref 3.6–5.1)
ALT: 14 U/L (ref 6–29)
AST: 16 U/L (ref 10–35)
Alkaline phosphatase (APISO): 112 U/L (ref 33–130)
BILIRUBIN TOTAL: 0.4 mg/dL (ref 0.2–1.2)
BUN: 13 mg/dL (ref 7–25)
CALCIUM: 9.3 mg/dL (ref 8.6–10.4)
CHLORIDE: 107 mmol/L (ref 98–110)
CO2: 26 mmol/L (ref 20–32)
Creat: 0.88 mg/dL (ref 0.50–1.05)
GLOBULIN: 2.4 g/dL (ref 1.9–3.7)
GLUCOSE: 87 mg/dL (ref 65–99)
POTASSIUM: 4.1 mmol/L (ref 3.5–5.3)
SODIUM: 143 mmol/L (ref 135–146)
TOTAL PROTEIN: 6.7 g/dL (ref 6.1–8.1)

## 2017-04-23 LAB — CBC WITH DIFFERENTIAL/PLATELET
BASOS PCT: 0.6 %
Basophils Absolute: 49 cells/uL (ref 0–200)
Eosinophils Absolute: 300 cells/uL (ref 15–500)
Eosinophils Relative: 3.7 %
HEMATOCRIT: 39.7 % (ref 35.0–45.0)
HEMOGLOBIN: 14.1 g/dL (ref 11.7–15.5)
LYMPHS ABS: 2657 {cells}/uL (ref 850–3900)
MCH: 30.2 pg (ref 27.0–33.0)
MCHC: 35.5 g/dL (ref 32.0–36.0)
MCV: 85 fL (ref 80.0–100.0)
MPV: 9.3 fL (ref 7.5–12.5)
Monocytes Relative: 5.2 %
Neutro Abs: 4674 cells/uL (ref 1500–7800)
Neutrophils Relative %: 57.7 %
Platelets: 329 10*3/uL (ref 140–400)
RBC: 4.67 10*6/uL (ref 3.80–5.10)
RDW: 13.1 % (ref 11.0–15.0)
Total Lymphocyte: 32.8 %
WBC mixed population: 421 cells/uL (ref 200–950)
WBC: 8.1 10*3/uL (ref 3.8–10.8)

## 2017-04-23 LAB — HEMOGLOBIN A1C
Hgb A1c MFr Bld: 5.3 % of total Hgb (ref <5.7)
MEAN PLASMA GLUCOSE: 105 (calc)
eAG (mmol/L): 5.8 (calc)

## 2017-04-23 LAB — TSH+FREE T4: TSH W/REFLEX TO FT4: 1.44 mIU/L (ref 0.40–4.50)

## 2017-04-23 LAB — VITAMIN B12: Vitamin B-12: 214 pg/mL (ref 200–1100)

## 2017-04-23 NOTE — Assessment & Plan Note (Signed)
CT chest negative for PE.  Incidentally noted was a 2 cm thyroid nodule in the left lobe, ordering thyroid ultrasound downstairs. Noted that she has had previous thyroid biopsies. We will compare the results of this new ultrasound with previous imaging.

## 2017-04-23 NOTE — Progress Notes (Signed)
Good evening Tina Navarro,  Your labs look good overall - no evidence of diabetes  - no thyroid disease in terms of hormone level, but as you know you have a nodule that requires an ultrasound - normal blood counts, no anemia  Your vitamin B12 is on the low end of normal. It's possible this is contributing to your toe numbness. I would recommend starting daily B12 supplement 2000 mcg on an empty stomach.  Your LDL cholesterol is 113. With your peripheral vascular disease, you want this below 70. It's good that you re-started your Atorvastatin. Continue 40 mg at bedtime and follow-up with Dr. Lyn HollingsheadAlexander in 3 months to recheck your fasting lipids.   Best, Vinetta Bergamoharley

## 2017-04-25 ENCOUNTER — Ambulatory Visit (INDEPENDENT_AMBULATORY_CARE_PROVIDER_SITE_OTHER): Payer: Federal, State, Local not specified - PPO

## 2017-04-25 DIAGNOSIS — E042 Nontoxic multinodular goiter: Secondary | ICD-10-CM

## 2017-04-29 ENCOUNTER — Ambulatory Visit: Payer: Federal, State, Local not specified - PPO | Admitting: Osteopathic Medicine

## 2017-04-29 ENCOUNTER — Telehealth: Payer: Self-pay

## 2017-04-29 ENCOUNTER — Encounter: Payer: Self-pay | Admitting: Osteopathic Medicine

## 2017-04-29 VITALS — BP 152/93 | HR 80 | Temp 97.7°F | Wt 173.0 lb

## 2017-04-29 DIAGNOSIS — G5793 Unspecified mononeuropathy of bilateral lower limbs: Secondary | ICD-10-CM

## 2017-04-29 DIAGNOSIS — N952 Postmenopausal atrophic vaginitis: Secondary | ICD-10-CM | POA: Diagnosis not present

## 2017-04-29 DIAGNOSIS — K588 Other irritable bowel syndrome: Secondary | ICD-10-CM

## 2017-04-29 DIAGNOSIS — I10 Essential (primary) hypertension: Secondary | ICD-10-CM | POA: Diagnosis not present

## 2017-04-29 DIAGNOSIS — R0781 Pleurodynia: Secondary | ICD-10-CM

## 2017-04-29 DIAGNOSIS — K219 Gastro-esophageal reflux disease without esophagitis: Secondary | ICD-10-CM | POA: Diagnosis not present

## 2017-04-29 MED ORDER — SENNOSIDES 8.6 MG PO TABS: 2.0000 | ORAL_TABLET | Freq: Every day | ORAL | 1 refills | Status: DC

## 2017-04-29 MED ORDER — OMEPRAZOLE 40 MG PO CPDR: 40.0000 mg | DELAYED_RELEASE_CAPSULE | Freq: Two times a day (BID) | ORAL | 3 refills | Status: DC

## 2017-04-29 MED ORDER — ESTRADIOL 0.1 MG/GM VA CREA: 1.0000 | TOPICAL_CREAM | VAGINAL | 12 refills | Status: DC

## 2017-04-29 MED ORDER — OLMESARTAN MEDOXOMIL 40 MG PO TABS: 40.0000 mg | ORAL_TABLET | Freq: Every day | ORAL | 1 refills | Status: DC

## 2017-04-29 MED ORDER — PROMETHAZINE HCL 12.5 MG PO TABS: 12.5000 mg | ORAL_TABLET | Freq: Four times a day (QID) | ORAL | 2 refills | Status: DC | PRN

## 2017-04-29 MED ORDER — LOSARTAN POTASSIUM 100 MG PO TABS: 100.0000 mg | ORAL_TABLET | Freq: Every day | ORAL | 1 refills | Status: DC

## 2017-04-29 MED ORDER — ASPIRIN 81 MG PO TABS: 81.0000 mg | ORAL_TABLET | Freq: Every day | ORAL | 3 refills | Status: DC

## 2017-04-29 NOTE — Telephone Encounter (Signed)
Sounds good - will send

## 2017-04-29 NOTE — Telephone Encounter (Signed)
[facility] pharmacist Mick called stating that benicar 40 mg (generic) is unavailable. Pharmacist suggest losartan 100 mg as a replacement. Pls advise if appropriate. Thanks.

## 2017-04-29 NOTE — Progress Notes (Signed)
HPI: Tina Navarro is a 57 y.o. female who  has a past medical history of Emphysematous COPD (HCC) (04/23/2017), GERD (gastroesophageal reflux disease), Hyperlipidemia, and Thyroid nodule (04/23/2017).  she presents to [facility] today, 04/29/17,  for chief complaint of:  Follow-up - See specific headings below   Rib pain: doing better. She saw Tina Navarro 04/22/17, pleuritic chest pain w/ neg PE w/u. Here for follow-up from this issue.  Skin concern: few spots she'd like looked at have been present for a few years. Previous PCP was just monitoring these but she's not sure what they are. 2 on L ribs, one on back, one on L pelvis.   Hypertension: Due for refills but has been taking medication. No other episodes of chest pain, shortness of breath, headache or vision change.  Irritable bowel syndrome: Stable on current medications. She has stopped the linens [place]. She uses senna and promethazine as needed.  GERD: On twice a day omeprazole 40 mg. Would like to continue this medication. Anytime she tries to back off of that her heartburn gets really bad.  Neuropathy: B12 low levels but not deficient.     Past medical history, surgical history, social history and family history reviewed. No updates needed.   Current medication list and allergy/intolerance information reviewed.    Current Outpatient Medications on File Prior to Visit  Medication Sig Dispense Refill  . aspirin 325 MG tablet Take 325 mg by mouth. Reported on 03/18/2015    . Aspirin-Salicylamide-Caffeine (BC HEADACHE POWDER PO) Take by mouth as needed.    Marland Kitchen atorvastatin (LIPITOR) 40 MG tablet Take 1 tablet (40 mg total) by mouth at bedtime. 90 tablet 3  . Cholecalciferol (VITAMIN D3) 5000 units TABS Take by mouth. Reported on 03/18/2015    . Linaclotide (LINZESS) 145 MCG CAPS capsule Take 1 capsule (145 mcg total) by mouth daily. 30 capsule 1  . olmesartan (BENICAR) 20 MG tablet Take 1 tablet (20 mg  total) by mouth daily. Patient due for follow up 30 tablet 0  . omeprazole (PRILOSEC) 40 MG capsule TAKE ONE CAPSULE TWICE DAILY 60 capsule 0  . promethazine (PHENERGAN) 12.5 MG tablet Take 1 tablet (12.5 mg total) by mouth every 6 (six) hours as needed for nausea or vomiting. Reported on 03/18/2015 30 tablet 2  . senna (SENOKOT) 8.6 MG tablet Take 2 tablets by mouth daily.     No current facility-administered medications on file prior to visit.    Allergies  Allergen Reactions  . Doxycycline       Review of Systems:  Constitutional: No recent illness  HEENT: No  headache, no vision change  Cardiac: No  chest pain, No  pressure, No palpitations  Respiratory:  No  shortness of breath. No  Cough  Gastrointestinal: No  abdominal pain, no change on bowel habits  Musculoskeletal: No new myalgia/arthralgia  Skin: No  Rash  Hem/Onc: No  easy bruising/bleeding, No  abnormal lumps/bumps  Neurologic: No  weakness, No  Dizziness  Psychiatric: No  concerns with depression, No  concerns with anxiety  Exam:  BP (!) 152/93 (BP Location: Right Arm)   Pulse 80   Temp 97.7 F (36.5 C) (Oral)   Wt 173 lb 0.6 oz (78.5 kg)   LMP  (LMP Unknown)   BMI 28.80 kg/m   Constitutional: VS see above. General Appearance: alert, well-developed, well-nourished, NAD  Eyes: Normal lids and conjunctive, non-icteric sclera  Ears, Nose, Mouth, Throat: MMM, Normal external inspection ears/nares/mouth/lips/gums.  Neck: No masses, trachea midline.   Respiratory: Normal respiratory effort. no wheeze, no rhonchi, no rales  Cardiovascular: S1/S2 normal, no murmur, no rub/gallop auscultated. RRR.   Musculoskeletal: Gait normal. Symmetric and independent movement of all extremities  Neurological: Normal balance/coordination. No tremor.  Skin: warm, dry, intact.   Psychiatric: Normal judgment/insight. Normal mood and affect. Oriented x3.     ASSESSMENT/PLAN:   Essential hypertension - Blood  pressure still elevated, plan to come back in 2 weeks for nurse visit recheck after increasing dose Benicar - Plan: aspirin 81 MG tablet, olmesartan (BENICAR) 40 MG tablet  Other irritable bowel syndrome - Plan: promethazine (PHENERGAN) 12.5 MG tablet, senna (SENOKOT) 8.6 MG tablet  Postmenopausal atrophic vaginitis - Plan: estradiol (ESTRACE VAGINAL) 0.1 MG/GM vaginal cream  Gastroesophageal reflux disease, esophagitis presence not specified - Discussed risks versus benefits of long-term use high-dose PPI. Patient aware and wants to proceed with therapy - Plan: omeprazole (PRILOSEC) 40 MG capsule  Pleuritic chest pain  Neuropathy involving both lower extremities - Plan to recheck B12 levels in a few months after supplementation. Consider further workup based on symptoms at that time       Follow-up plan: Return in about 6 weeks (around 06/10/2017) for recheck B12 and neuropathy .  Visit summary with medication list and pertinent instructions was printed for patient to review, alert us if any changes needed. All questions at time of visit were answered - patient instructed to contact office with any additional concerns. ER/RTC precautions were reviewed with the patient and understanding verbalized.   Note: Total time spent 25 minutes, greater than 50% of the visit was spent face-to-face counseling and coordinating care for the following: The primary encounter diagnosis was Essential hypertension. Diagnoses of Other irritable bowel syndrome, Postmenopausal atrophic vaginitis, Gastroesophageal reflux disease, esophagitis presence not specified, Pleuritic chest pain, and Neuropathy involving both lower extremities were also pertinent to this visit.Marland Kitchen.  Please note: voice recognition software was used to produce this document, and typos may escape review. Please contact Dr. Lyn HollingsheadAlexander for any needed clarifications.

## 2017-04-29 NOTE — Telephone Encounter (Signed)
Pt advised, no further questions.

## 2017-04-30 DIAGNOSIS — Z1231 Encounter for screening mammogram for malignant neoplasm of breast: Secondary | ICD-10-CM | POA: Diagnosis not present

## 2017-05-02 DIAGNOSIS — E042 Nontoxic multinodular goiter: Secondary | ICD-10-CM | POA: Diagnosis not present

## 2017-05-13 ENCOUNTER — Ambulatory Visit (INDEPENDENT_AMBULATORY_CARE_PROVIDER_SITE_OTHER): Payer: Federal, State, Local not specified - PPO | Admitting: Osteopathic Medicine

## 2017-05-13 VITALS — BP 131/66 | HR 80 | Temp 97.8°F | Resp 16 | Wt 173.0 lb

## 2017-05-13 DIAGNOSIS — I10 Essential (primary) hypertension: Secondary | ICD-10-CM | POA: Diagnosis not present

## 2017-05-13 NOTE — Progress Notes (Signed)
HPI: Patient is here for a blood pressure check.  Assessment and Plan: Patient was advised of the blood pressure medication changing from Benicar 20 mg taking 2 (40 mg) tablets orally daily - patient has #30 tablets - enough for two weeks. Patient will then change to Losartan 100 mg - 1 tablet orally daily -  due to manufacturer back order. Patient stated she understood and would. Patient has follow up appointment with provider on 06/10/17 at 1:00 pm.

## 2017-05-14 ENCOUNTER — Encounter: Payer: Self-pay | Admitting: Osteopathic Medicine

## 2017-05-29 ENCOUNTER — Other Ambulatory Visit: Payer: Self-pay

## 2017-05-29 DIAGNOSIS — I739 Peripheral vascular disease, unspecified: Secondary | ICD-10-CM

## 2017-06-10 ENCOUNTER — Ambulatory Visit (INDEPENDENT_AMBULATORY_CARE_PROVIDER_SITE_OTHER): Payer: Federal, State, Local not specified - PPO | Admitting: Osteopathic Medicine

## 2017-06-10 ENCOUNTER — Encounter: Payer: Self-pay | Admitting: Osteopathic Medicine

## 2017-06-10 VITALS — BP 125/75 | HR 86 | Temp 97.7°F | Wt 171.1 lb

## 2017-06-10 DIAGNOSIS — R202 Paresthesia of skin: Secondary | ICD-10-CM | POA: Diagnosis not present

## 2017-06-10 DIAGNOSIS — I10 Essential (primary) hypertension: Secondary | ICD-10-CM

## 2017-06-10 MED ORDER — LOSARTAN POTASSIUM 100 MG PO TABS: 100.0000 mg | ORAL_TABLET | Freq: Every day | ORAL | 1 refills | Status: DC

## 2017-06-10 NOTE — Progress Notes (Signed)
HPI: Tina Navarro is a 57 y.o. female who  has a past medical history of Emphysematous COPD (HCC) (04/23/2017), GERD (gastroesophageal reflux disease), Hyperlipidemia, and Thyroid nodule (04/23/2017).  she presents to [facility] today, 06/10/17,  for chief complaint of:  Follow-up HTN  B12 recheck   Patient is back on the losartan 100 mg, doing well on this medication.  Here seizures and lower extremities have been better on B12, not totally resolved but almost. No weakness and no fall.    Past medical history, surgical history, social history and family history reviewed.    Current medication list and allergy/intolerance information reviewed.    Current Outpatient Medications on File Prior to Visit  Medication Sig Dispense Refill  . aspirin 81 MG tablet Take 1 tablet (81 mg total) by mouth daily. 90 tablet 3  . Aspirin-Salicylamide-Caffeine (BC HEADACHE POWDER PO) Take by mouth as needed.    Marland Kitchen atorvastatin (LIPITOR) 40 MG tablet Take 1 tablet (40 mg total) by mouth at bedtime. 90 tablet 3  . Cholecalciferol (VITAMIN D3) 5000 units TABS Take by mouth. Reported on 03/18/2015    . estradiol (ESTRACE VAGINAL) 0.1 MG/GM vaginal cream Place 1 Applicatorful vaginally 3 (three) times a week. 42.5 g 12  . losartan (COZAAR) 100 MG tablet Take 1 tablet (100 mg total) by mouth daily. 30 tablet 1  . omeprazole (PRILOSEC) 40 MG capsule Take 1 capsule (40 mg total) by mouth 2 (two) times daily. 180 capsule 3  . promethazine (PHENERGAN) 12.5 MG tablet Take 1 tablet (12.5 mg total) by mouth every 6 (six) hours as needed for nausea or vomiting. Reported on 03/18/2015 30 tablet 2  . senna (SENOKOT) 8.6 MG tablet Take 2 tablets (17.2 mg total) by mouth daily. As needed for constipation 90 tablet 1   No current facility-administered medications on file prior to visit.    Allergies  Allergen Reactions  . Doxycycline       Review of Systems:  Constitutional: No  recent illness  HEENT: No  headache, no vision change  Cardiac: No  chest pain, No  pressure, No palpitations  Respiratory:  No  shortness of breath. No  Cough  Neurologic: No  weakness, No  Dizziness   Exam:  BP 125/75   Pulse 86   Temp 97.7 F (36.5 C) (Oral)   Wt 171 lb 1.9 oz (77.6 kg)   LMP  (LMP Unknown)   BMI 28.48 kg/m   Constitutional: VS see above. General Appearance: alert, well-developed, well-nourished, NAD  Eyes: Normal lids and conjunctive, non-icteric sclera  Ears, Nose, Mouth, Throat: MMM, Normal external inspection ears/nares/mouth/lips/gums.  Neck: No masses, trachea midline.   Respiratory: Normal respiratory effort. no wheeze, no rhonchi, no rales  Cardiovascular: S1/S2 normal, no murmur, no rub/gallop auscultated. RRR.   Musculoskeletal: Gait normal. Symmetric and independent movement of all extremities  Neurological: Normal balance/coordination. No tremor.  Skin: warm, dry, intact.   Psychiatric: Normal judgment/insight. Normal mood and affect. Oriented x3.     ASSESSMENT/PLAN:   Essential hypertension  Paresthesias - Plan: Vitamin B12   Meds ordered this encounter  Medications  . losartan (COZAAR) 100 MG tablet    Sig: Take 1 tablet (100 mg total) by mouth daily.    Dispense:  90 tablet    Refill:  1    Please cancel Benicar, thanks!    There are no Patient Instructions on file for this visit.   Follow-up plan: Return in about 6  months (around 12/10/2017) for recheck BP w/ Dr Lyn HollingsheadAlexander, sooner if needed! Marland Kitchen. as long as labs ok!   Visit summary with medication list and pertinent instructions was printed for patient to review, alert us if any changes needed. All questions at time of visit were answered - patient instructed to contact office with any additional concerns. ER/RTC precautions were reviewed with the patient and understanding verbalized.   Note: Total time spent 15 minutes, greater than 50% of the visit was spent  face-to-face counseling and coordinating care for the following: The primary encounter diagnosis was Essential hypertension. A diagnosis of Paresthesias was also pertinent to this visit.Marland Kitchen.  Please note: voice recognition software was used to produce this document, and typos may escape review. Please contact Dr. Lyn HollingsheadAlexander for any needed clarifications.

## 2017-06-11 LAB — VITAMIN B12: VITAMIN B 12: 1719 pg/mL — AB (ref 200–1100)

## 2017-06-24 ENCOUNTER — Other Ambulatory Visit: Payer: Self-pay | Admitting: *Deleted

## 2017-06-24 ENCOUNTER — Ambulatory Visit (INDEPENDENT_AMBULATORY_CARE_PROVIDER_SITE_OTHER): Payer: Federal, State, Local not specified - PPO | Admitting: Surgery

## 2017-06-24 ENCOUNTER — Ambulatory Visit (HOSPITAL_COMMUNITY)
Admission: RE | Admit: 2017-06-24 | Discharge: 2017-06-24 | Disposition: A | Discharge status: Home / Self Care | Payer: Federal, State, Local not specified - PPO | Source: Ambulatory Visit | Attending: Surgery | Admitting: Surgery

## 2017-06-24 ENCOUNTER — Ambulatory Visit (INDEPENDENT_AMBULATORY_CARE_PROVIDER_SITE_OTHER)
Admission: RE | Admit: 2017-06-24 | Discharge: 2017-06-24 | Disposition: A | Discharge status: Home / Self Care | Payer: Federal, State, Local not specified - PPO | Source: Ambulatory Visit | Attending: Surgery | Admitting: Surgery

## 2017-06-24 ENCOUNTER — Encounter: Payer: Self-pay | Admitting: Surgery

## 2017-06-24 ENCOUNTER — Other Ambulatory Visit: Payer: Self-pay | Admitting: Surgery

## 2017-06-24 VITALS — BP 135/76 | HR 79 | Temp 97.4°F | Resp 18 | Ht 65.0 in | Wt 171.0 lb

## 2017-06-24 DIAGNOSIS — I739 Peripheral vascular disease, unspecified: Secondary | ICD-10-CM

## 2017-06-24 DIAGNOSIS — I70203 Unspecified atherosclerosis of native arteries of extremities, bilateral legs: Secondary | ICD-10-CM | POA: Insufficient documentation

## 2017-06-24 DIAGNOSIS — I70213 Atherosclerosis of native arteries of extremities with intermittent claudication, bilateral legs: Secondary | ICD-10-CM | POA: Diagnosis not present

## 2017-06-24 NOTE — Progress Notes (Signed)
Sched for 5/21 patient's request. To be at [facility] admitting department at 7 am on 07/16/17 for procedure. No food or drink past MN night prior and must have a driver for home and someone at discharge. To take Losartan am of with sips of water and hold all other medications until after procedure. Verbalized understanding.

## 2017-06-24 NOTE — H&P (View-Only) (Signed)
[facility] [place]  Patient name: Tina Navarro MRN: [id number] DOB: 1960-05-02 Sex: female   REQUESTING PROVIDER:    Sunnie Nielsen   REASON FOR CONSULT:    Leg pain / PAD  HISTORY OF PRESENT ILLNESS:   Tina Navarro is a 57 y.o. female, who is referred today for evaluation of bilateral leg pain, right greater than left.  The patient states that she has had percutaneous intervention in 2007.  At that time both legs were treated.  She states that she is starting to have recurrent symptoms which include pain in her right buttock and thigh with activity.  These are similar to the symptoms that were alleviated with treatment in 2007.  She has transferred into the [facility] health system and therefore new vascular referral was made.  She had not had vascular follow-up in at least 3 years.  She does have a family history of PAD and her father who ultimately required amputation. She is medically managed for hypertension with an ARB.  She takes a statin for hypercholesterolemia.  She is a current smoker.  PAST MEDICAL HISTORY    Past Medical History:  Diagnosis Date  . Emphysematous COPD (HCC) 04/23/2017  . GERD (gastroesophageal reflux disease)   . Hyperlipidemia   . Peripheral arterial disease (HCC)   . Thyroid nodule 04/23/2017     FAMILY HISTORY   Family History  Problem Relation Age of Onset  . Hypertension Mother   . Hypertension Father   . Heart disease Father   . Peripheral vascular disease Father   . Alcohol abuse Father   . Cancer Maternal Grandmother        BREAST  . Stroke Maternal Grandmother        CEBERAL ATHEROSCLEROSIS  . Cancer - Ovarian Cousin   . Fibroids Sister     SOCIAL HISTORY:   Social History   Socioeconomic History  . Marital status: Married    Spouse name: Not on file  . Number of children: Not on file  . Years of education: Not on file  . Highest education level: Not on file    Occupational History  . Not on file  Social Needs  . Financial resource strain: Not on file  . Food insecurity:    Worry: Not on file    Inability: Not on file  . Transportation needs:    Medical: Not on file    Non-medical: Not on file  Tobacco Use  . Smoking status: Current Every Day Smoker    Packs/day: 0.50    Years: 35.00    Pack years: 17.50    Types: Cigarettes  . Smokeless tobacco: Never Used  Substance and Sexual Activity  . Alcohol use: No  . Drug use: No  . Sexual activity: Yes  Lifestyle  . Physical activity:    Days per week: Not on file    Minutes per session: Not on file  . Stress: Not on file  Relationships  . Social connections:    Talks on phone: Not on file    Gets together: Not on file    Attends religious service: Not on file    Active member of club or organization: Not on file    Attends meetings of clubs or organizations: Not on file    Relationship status: Not on file  . Intimate partner violence:    Fear of current or ex partner: Not on file    Emotionally abused: Not on file  Physically abused: Not on file    Forced sexual activity: Not on file  Other Topics Concern  . Not on file  Social History Narrative  . Not on file    ALLERGIES:    Allergies  Allergen Reactions  . Doxycycline     CURRENT MEDICATIONS:    Current Outpatient Medications  Medication Sig Dispense Refill  . aspirin 81 MG tablet Take 1 tablet (81 mg total) by mouth daily. 90 tablet 3  . Aspirin-Salicylamide-Caffeine (BC HEADACHE POWDER PO) Take by mouth as needed.    Marland Kitchen atorvastatin (LIPITOR) 40 MG tablet Take 1 tablet (40 mg total) by mouth at bedtime. 90 tablet 3  . Cholecalciferol (VITAMIN D3) 5000 units TABS Take by mouth. Reported on 03/18/2015    . estradiol (ESTRACE VAGINAL) 0.1 MG/GM vaginal cream Place 1 Applicatorful vaginally 3 (three) times a week. 42.5 g 12  . losartan (COZAAR) 100 MG tablet Take 1 tablet (100 mg total) by mouth daily. 90 tablet 1   . omeprazole (PRILOSEC) 40 MG capsule Take 1 capsule (40 mg total) by mouth 2 (two) times daily. 180 capsule 3  . promethazine (PHENERGAN) 12.5 MG tablet Take 1 tablet (12.5 mg total) by mouth every 6 (six) hours as needed for nausea or vomiting. Reported on 03/18/2015 30 tablet 2  . senna (SENOKOT) 8.6 MG tablet Take 2 tablets (17.2 mg total) by mouth daily. As needed for constipation 90 tablet 1  . vitamin B-12 (CYANOCOBALAMIN) 1000 MCG tablet Take 2,000 mcg by mouth daily.     No current facility-administered medications for this visit.     REVIEW OF SYSTEMS:    denotes positive finding,  denotes negative finding Cardiac  Comments:  Chest pain or chest pressure:    Shortness of breath upon exertion:    Short of breath when lying flat:    Irregular heart rhythm:        Vascular    Pain in calf, thigh, or hip brought on by ambulation: x   Pain in feet at night that wakes you up from your sleep:     Blood clot in your veins:    Leg swelling:         Pulmonary    Oxygen at home:    Productive cough:     Wheezing:         Neurologic    Sudden weakness in arms or legs:     Sudden numbness in arms or legs:     Sudden onset of difficulty speaking or slurred speech:    Temporary loss of vision in one eye:     Problems with dizziness:         Gastrointestinal    Blood in stool:      Vomited blood:         Genitourinary    Burning when urinating:     Blood in urine:        Psychiatric    Major depression:         Hematologic    Bleeding problems:    Problems with blood clotting too easily:        Skin    Rashes or ulcers:        Constitutional    Fever or chills:     PHYSICAL EXAM:   Vitals:   06/24/17 1207  BP: 135/76  Pulse: 79  Resp: 18  Temp: (!) 97.4 F (36.3 C)  TempSrc: Oral  SpO2: 99%  Weight:  171 lb (77.6 kg)  Height:  (1.651 m)    GENERAL: The patient is a well-nourished female, in no acute distress. The vital signs are documented  above. CARDIAC: There is a regular rate and rhythm.  VASCULAR: Faintly palpable right dorsalis pedis pulse.  Palpable left dorsalis pedis pulse.  No carotid bruits PULMONARY: Nonlabored respirations ABDOMEN: Soft and non-tender with normal pitched bowel sounds.  MUSCULOSKELETAL: There are no major deformities or cyanosis. NEUROLOGIC: No focal weakness or paresthesias are detected. SKIN: There are no ulcers or rashes noted. PSYCHIATRIC: The patient has a normal affect.  STUDIES:   ABIs today reveal 0.89 and biphasic on the right and 1.07 and biphasic on the left.  Duplex ultrasound shows greater than 50% right iliac stenosis and greater than 50% left iliac stenosis.  ASSESSMENT and PLAN   Atherosclerotic vascular disease: The patient's symptoms are similar to what she was experiencing in 2007 when they were alleviated with intervention.  We have elected to proceed with angiography and possible intervention on her aortoiliac arteries.  She is going to contact me with a possible date.  I will plan on access in the left groin.  I would be very reluctant to intervene in the superficial femoral artery.  We will evaluate her for possible stents in her iliac artery and treat in-stent stenosis or any high-grade lesion.   Durene Cal, MD [facility] [phone] Pager [phone]

## 2017-06-24 NOTE — Progress Notes (Signed)
 [facility] [place]  Patient name: Tina Navarro MRN: [id number] DOB: 06/05/1960 Sex: female   REQUESTING PROVIDER:    Natalie Alexander   REASON FOR CONSULT:    Leg pain / PAD  HISTORY OF PRESENT ILLNESS:   Tina Navarro is a 57 y.o. female, who is referred today for evaluation of bilateral leg pain, right greater than left.  The patient states that she has had percutaneous intervention in 2007.  At that time both legs were treated.  She states that she is starting to have recurrent symptoms which include pain in her right buttock and thigh with activity.  These are similar to the symptoms that were alleviated with treatment in 2007.  She has transferred into the [facility] system and therefore new vascular referral was made.  She had not had vascular follow-up in at least 3 years.  She does have a family history of PAD and her father who ultimately required amputation. She is medically managed for hypertension with an ARB.  She takes a statin for hypercholesterolemia.  She is a current smoker.  PAST MEDICAL HISTORY    Past Medical History:  Diagnosis Date  . Emphysematous COPD (HCC) 04/23/2017  . GERD (gastroesophageal reflux disease)   . Hyperlipidemia   . Peripheral arterial disease (HCC)   . Thyroid nodule 04/23/2017     FAMILY HISTORY   Family History  Problem Relation Age of Onset  . Hypertension Mother   . Hypertension Father   . Heart disease Father   . Peripheral vascular disease Father   . Alcohol abuse Father   . Cancer Maternal Grandmother        BREAST  . Stroke Maternal Grandmother        CEBERAL ATHEROSCLEROSIS  . Cancer - Ovarian Cousin   . Fibroids Sister     SOCIAL HISTORY:   Social History   Socioeconomic History  . Marital status: Married    Spouse name: Not on file  . Number of children: Not on file  . Years of education: Not on file  . Highest education level: Not on file    Occupational History  . Not on file  Social Needs  . Financial resource strain: Not on file  . Food insecurity:    Worry: Not on file    Inability: Not on file  . Transportation needs:    Medical: Not on file    Non-medical: Not on file  Tobacco Use  . Smoking status: Current Every Day Smoker    Packs/day: 0.50    Years: 35.00    Pack years: 17.50    Types: Cigarettes  . Smokeless tobacco: Never Used  Substance and Sexual Activity  . Alcohol use: No  . Drug use: No  . Sexual activity: Yes  Lifestyle  . Physical activity:    Days per week: Not on file    Minutes per session: Not on file  . Stress: Not on file  Relationships  . Social connections:    Talks on phone: Not on file    Gets together: Not on file    Attends religious service: Not on file    Active member of club or organization: Not on file    Attends meetings of clubs or organizations: Not on file    Relationship status: Not on file  . Intimate partner violence:    Fear of current or ex partner: Not on file    Emotionally abused: Not on file      Physically abused: Not on file    Forced sexual activity: Not on file  Other Topics Concern  . Not on file  Social History Narrative  . Not on file    ALLERGIES:    Allergies  Allergen Reactions  . Doxycycline     CURRENT MEDICATIONS:    Current Outpatient Medications  Medication Sig Dispense Refill  . aspirin 81 MG tablet Take 1 tablet (81 mg total) by mouth daily. 90 tablet 3  . Aspirin-Salicylamide-Caffeine (BC HEADACHE POWDER PO) Take by mouth as needed.    . atorvastatin (LIPITOR) 40 MG tablet Take 1 tablet (40 mg total) by mouth at bedtime. 90 tablet 3  . Cholecalciferol (VITAMIN D3) 5000 units TABS Take by mouth. Reported on 03/18/2015    . estradiol (ESTRACE VAGINAL) 0.1 MG/GM vaginal cream Place 1 Applicatorful vaginally 3 (three) times a week. 42.5 g 12  . losartan (COZAAR) 100 MG tablet Take 1 tablet (100 mg total) by mouth daily. 90 tablet 1   . omeprazole (PRILOSEC) 40 MG capsule Take 1 capsule (40 mg total) by mouth 2 (two) times daily. 180 capsule 3  . promethazine (PHENERGAN) 12.5 MG tablet Take 1 tablet (12.5 mg total) by mouth every 6 (six) hours as needed for nausea or vomiting. Reported on 03/18/2015 30 tablet 2  . senna (SENOKOT) 8.6 MG tablet Take 2 tablets (17.2 mg total) by mouth daily. As needed for constipation 90 tablet 1  . vitamin B-12 (CYANOCOBALAMIN) 1000 MCG tablet Take 2,000 mcg by mouth daily.     No current facility-administered medications for this visit.     REVIEW OF SYSTEMS:   [X] denotes positive finding, [ ] denotes negative finding Cardiac  Comments:  Chest pain or chest pressure:    Shortness of breath upon exertion:    Short of breath when lying flat:    Irregular heart rhythm:        Vascular    Pain in calf, thigh, or hip brought on by ambulation: x   Pain in feet at night that wakes you up from your sleep:     Blood clot in your veins:    Leg swelling:         Pulmonary    Oxygen at home:    Productive cough:     Wheezing:         Neurologic    Sudden weakness in arms or legs:     Sudden numbness in arms or legs:     Sudden onset of difficulty speaking or slurred speech:    Temporary loss of vision in one eye:     Problems with dizziness:         Gastrointestinal    Blood in stool:      Vomited blood:         Genitourinary    Burning when urinating:     Blood in urine:        Psychiatric    Major depression:         Hematologic    Bleeding problems:    Problems with blood clotting too easily:        Skin    Rashes or ulcers:        Constitutional    Fever or chills:     PHYSICAL EXAM:   Vitals:   06/24/17 1207  BP: 135/76  Pulse: 79  Resp: 18  Temp: (!) 97.4 F (36.3 C)  TempSrc: Oral  SpO2: 99%  Weight:   171 lb (77.6 kg)  Height: 5' 5" (1.651 m)    GENERAL: The patient is a well-nourished female, in no acute distress. The vital signs are documented  above. CARDIAC: There is a regular rate and rhythm.  VASCULAR: Faintly palpable right dorsalis pedis pulse.  Palpable left dorsalis pedis pulse.  No carotid bruits PULMONARY: Nonlabored respirations ABDOMEN: Soft and non-tender with normal pitched bowel sounds.  MUSCULOSKELETAL: There are no major deformities or cyanosis. NEUROLOGIC: No focal weakness or paresthesias are detected. SKIN: There are no ulcers or rashes noted. PSYCHIATRIC: The patient has a normal affect.  STUDIES:   ABIs today reveal 0.89 and biphasic on the right and 1.07 and biphasic on the left.  Duplex ultrasound shows greater than 50% right iliac stenosis and greater than 50% left iliac stenosis.  ASSESSMENT and PLAN   Atherosclerotic vascular disease: The patient's symptoms are similar to what she was experiencing in 2007 when they were alleviated with intervention.  We have elected to proceed with angiography and possible intervention on her aortoiliac arteries.  She is going to contact me with a possible date.  I will plan on access in the left groin.  I would be very reluctant to intervene in the superficial femoral artery.  We will evaluate her for possible stents in her iliac artery and treat in-stent stenosis or any high-grade lesion.   Wells Diondre Pulis, MD [facility] [place] Tel [phone] Pager [phone] 

## 2017-07-16 ENCOUNTER — Ambulatory Visit (HOSPITAL_COMMUNITY)
Admission: RE | Admit: 2017-07-16 | Discharge: 2017-07-16 | Disposition: A | Discharge status: Home / Self Care | Payer: Federal, State, Local not specified - PPO | Source: Ambulatory Visit | Attending: Surgery | Admitting: Surgery

## 2017-07-16 ENCOUNTER — Ambulatory Visit (HOSPITAL_COMMUNITY)
Admission: RE | Disposition: A | Discharge status: Home / Self Care | Payer: Self-pay | Source: Ambulatory Visit | Attending: Surgery

## 2017-07-16 DIAGNOSIS — Z8249 Family history of ischemic heart disease and other diseases of the circulatory system: Secondary | ICD-10-CM | POA: Diagnosis not present

## 2017-07-16 DIAGNOSIS — K219 Gastro-esophageal reflux disease without esophagitis: Secondary | ICD-10-CM | POA: Diagnosis not present

## 2017-07-16 DIAGNOSIS — T82856A Stenosis of peripheral vascular stent, initial encounter: Secondary | ICD-10-CM | POA: Diagnosis not present

## 2017-07-16 DIAGNOSIS — E7800 Pure hypercholesterolemia, unspecified: Secondary | ICD-10-CM | POA: Diagnosis not present

## 2017-07-16 DIAGNOSIS — F17210 Nicotine dependence, cigarettes, uncomplicated: Secondary | ICD-10-CM | POA: Diagnosis not present

## 2017-07-16 DIAGNOSIS — Y831 Surgical operation with implant of artificial internal device as the cause of abnormal reaction of the patient, or of later complication, without mention of misadventure at the time of the procedure: Secondary | ICD-10-CM | POA: Insufficient documentation

## 2017-07-16 DIAGNOSIS — I10 Essential (primary) hypertension: Secondary | ICD-10-CM | POA: Insufficient documentation

## 2017-07-16 DIAGNOSIS — Z7982 Long term (current) use of aspirin: Secondary | ICD-10-CM | POA: Diagnosis not present

## 2017-07-16 DIAGNOSIS — I70213 Atherosclerosis of native arteries of extremities with intermittent claudication, bilateral legs: Secondary | ICD-10-CM | POA: Diagnosis not present

## 2017-07-16 DIAGNOSIS — I739 Peripheral vascular disease, unspecified: Secondary | ICD-10-CM | POA: Diagnosis not present

## 2017-07-16 HISTORY — PX: ABDOMINAL AORTOGRAM: CATH118222

## 2017-07-16 HISTORY — PX: PERIPHERAL VASCULAR BALLOON ANGIOPLASTY: CATH118281

## 2017-07-16 HISTORY — PX: LOWER EXTREMITY ANGIOGRAPHY: CATH118251

## 2017-07-16 LAB — POCT I-STAT, CHEM 8
BUN: 14 mg/dL (ref 6–20)
CALCIUM ION: 1.15 mmol/L (ref 1.15–1.40)
CHLORIDE: 111 mmol/L (ref 101–111)
CREATININE: 0.8 mg/dL (ref 0.44–1.00)
Glucose, Bld: 92 mg/dL (ref 65–99)
HCT: 42 % (ref 36.0–46.0)
Hemoglobin: 14.3 g/dL (ref 12.0–15.0)
POTASSIUM: 3.8 mmol/L (ref 3.5–5.1)
Sodium: 143 mmol/L (ref 135–145)
TCO2: 24 mmol/L (ref 22–32)

## 2017-07-16 LAB — POCT ACTIVATED CLOTTING TIME
ACTIVATED CLOTTING TIME: 186 s
Activated Clotting Time: 219 seconds

## 2017-07-16 SURGERY — ABDOMINAL AORTOGRAM
Anesthesia: LOCAL

## 2017-07-16 MED ORDER — SODIUM CHLORIDE 0.9 % IV SOLN: 250.0000 mL | INTRAVENOUS | Status: DC | PRN

## 2017-07-16 MED ORDER — SODIUM CHLORIDE 0.9 % WEIGHT BASED INFUSION: 1.0000 mL/kg/h | INTRAVENOUS | Status: DC

## 2017-07-16 MED ORDER — ACETAMINOPHEN 325 MG PO TABS: 650.0000 mg | ORAL_TABLET | ORAL | Status: DC | PRN

## 2017-07-16 MED ORDER — SODIUM CHLORIDE 0.9% FLUSH: 3.0000 mL | INTRAVENOUS | Status: DC | PRN

## 2017-07-16 MED ORDER — HEPARIN (PORCINE) IN NACL 1000-0.9 UT/500ML-% IV SOLN
INTRAVENOUS | Status: AC
  Filled 2017-07-16: qty 500

## 2017-07-16 MED ORDER — HEPARIN (PORCINE) IN NACL 2-0.9 UNITS/ML
INTRAMUSCULAR | Status: AC | PRN
  Administered 2017-07-16 (×2): 500 mL

## 2017-07-16 MED ORDER — HYDRALAZINE HCL 20 MG/ML IJ SOLN: 5.0000 mg | INTRAMUSCULAR | Status: DC | PRN

## 2017-07-16 MED ORDER — LIDOCAINE HCL (PF) 1 % IJ SOLN
INTRAMUSCULAR | Status: DC | PRN
  Administered 2017-07-16 (×2): 10 mL

## 2017-07-16 MED ORDER — IODIXANOL 320 MG/ML IV SOLN
INTRAVENOUS | Status: DC | PRN
  Administered 2017-07-16: 145 mL via INTRAVENOUS

## 2017-07-16 MED ORDER — MIDAZOLAM HCL 2 MG/2ML IJ SOLN
INTRAMUSCULAR | Status: AC
  Filled 2017-07-16: qty 2

## 2017-07-16 MED ORDER — MIDAZOLAM HCL 2 MG/2ML IJ SOLN
INTRAMUSCULAR | Status: DC | PRN
  Administered 2017-07-16 (×3): 1 mg via INTRAVENOUS

## 2017-07-16 MED ORDER — HEPARIN SODIUM (PORCINE) 1000 UNIT/ML IJ SOLN
INTRAMUSCULAR | Status: AC
  Filled 2017-07-16: qty 1

## 2017-07-16 MED ORDER — CLOPIDOGREL BISULFATE 75 MG PO TABS: 75.0000 mg | ORAL_TABLET | Freq: Every day | ORAL | Status: DC

## 2017-07-16 MED ORDER — LIDOCAINE HCL (PF) 1 % IJ SOLN
INTRAMUSCULAR | Status: AC
  Filled 2017-07-16: qty 30

## 2017-07-16 MED ORDER — MORPHINE SULFATE (PF) 10 MG/ML IV SOLN: 2.0000 mg | INTRAVENOUS | Status: DC | PRN

## 2017-07-16 MED ORDER — SODIUM CHLORIDE 0.9 % IV SOLN
INTRAVENOUS | Status: DC
  Administered 2017-07-16: 08:00:00 via INTRAVENOUS

## 2017-07-16 MED ORDER — LABETALOL HCL 5 MG/ML IV SOLN: 10.0000 mg | INTRAVENOUS | Status: DC | PRN

## 2017-07-16 MED ORDER — SODIUM CHLORIDE 0.9% FLUSH: 3.0000 mL | Freq: Two times a day (BID) | INTRAVENOUS | Status: DC

## 2017-07-16 MED ORDER — CLOPIDOGREL BISULFATE 75 MG PO TABS: 75.0000 mg | ORAL_TABLET | Freq: Every day | ORAL | 11 refills | Status: DC

## 2017-07-16 MED ORDER — ONDANSETRON HCL 4 MG/2ML IJ SOLN: 4.0000 mg | Freq: Four times a day (QID) | INTRAMUSCULAR | Status: DC | PRN

## 2017-07-16 MED ORDER — FENTANYL CITRATE (PF) 100 MCG/2ML IJ SOLN
INTRAMUSCULAR | Status: AC
  Filled 2017-07-16: qty 2

## 2017-07-16 MED ORDER — HEPARIN SODIUM (PORCINE) 1000 UNIT/ML IJ SOLN
INTRAMUSCULAR | Status: DC | PRN
  Administered 2017-07-16: 8000 [IU] via INTRAVENOUS

## 2017-07-16 MED ORDER — FENTANYL CITRATE (PF) 100 MCG/2ML IJ SOLN
INTRAMUSCULAR | Status: DC | PRN
  Administered 2017-07-16 (×3): 25 ug via INTRAVENOUS

## 2017-07-16 MED ORDER — ASPIRIN EC 81 MG PO TBEC
81.0000 mg | DELAYED_RELEASE_TABLET | Freq: Every day | ORAL | Status: DC
  Filled 2017-07-16: qty 1

## 2017-07-16 MED ORDER — OXYCODONE HCL 5 MG PO TABS: 5.0000 mg | ORAL_TABLET | ORAL | Status: DC | PRN

## 2017-07-16 SURGICAL SUPPLY — 15 items
BALLN MUSTANG 7.0X20 75 (BALLOONS) ×3
BALLOON MUSTANG 7.0X20 75 (BALLOONS) ×2 IMPLANT
CATH OMNI FLUSH 5F 65CM (CATHETERS) ×3 IMPLANT
COVER PRB 48X5XTLSCP FOLD TPE (BAG) ×2 IMPLANT
COVER PROBE 5X48 (BAG) ×1
KIT ENCORE 26 ADVANTAGE (KITS) ×3 IMPLANT
KIT MICROPUNCTURE NIT STIFF (SHEATH) ×3 IMPLANT
KIT PV (KITS) ×3 IMPLANT
SHEATH BRITE TIP 6FR 35CM (SHEATH) ×6 IMPLANT
SHEATH PINNACLE 5F 10CM (SHEATH) ×3 IMPLANT
SYR MEDRAD MARK V 150ML (SYRINGE) ×3 IMPLANT
TRANSDUCER W/STOPCOCK (MISCELLANEOUS) ×3 IMPLANT
TRAY PV CATH (CUSTOM PROCEDURE TRAY) ×3 IMPLANT
WIRE BENTSON .035X145CM (WIRE) ×3 IMPLANT
WIRE HITORQ VERSACORE ST 145CM (WIRE) ×3 IMPLANT

## 2017-07-16 NOTE — Op Note (Signed)
Patient name: Tina Navarro MRN: [id number] DOB: [birth year] Sex: female  07/16/2017 Pre-operative Diagnosis: Bilateral claudication Post-operative diagnosis:  Same Surgeon:  Annamarie Major Procedure Performed:  1.  Ultrasound-guided access, left femoral artery  2.  Ultrasound-guided access, right femoral artery  3.  Abdominal aortogram  4.  Bilateral lower extremity runoff  5.  Angioplasty, left common iliac artery  6.  Angioplasty, right common iliac artery  7.  Conscious sedation (54 minutes)   Indications: The patient has a history of a intervention in 2007 for claudication in a different location.  She states she is having recurrent symptoms, similar to the one she had before the stents one in.  She comes in today for further evaluation.  Procedure:  The patient was identified in the holding area and taken to room 8.  The patient was then placed supine on the table and prepped and draped in the usual sterile fashion.  A time out was called.  Conscious sedation was administered with the use of IV fentanyl and Versed under continuous physician and nurse monitoring.  Heart rate, blood pressure, and oxygen saturations were continuously monitored.  Ultrasound was used to evaluate the left common femoral artery.  It was patent .  A digital ultrasound image was acquired.  A micropuncture needle was used to access the left common femoral artery under ultrasound guidance.  An 018 wire was advanced without resistance and a micropuncture sheath was placed.  The 018 wire was removed and a benson wire was placed.  The micropuncture sheath was exchanged for a 5 french sheath.  An omniflush catheter was advanced over the wire to the level of L-1.  An abdominal angiogram was obtained, followed by pelvic angiography in multiple obliquities.  Bilateral was also performed catheter in the aorta. Findings:   Aortogram: No significant renal artery stenosis.  The infrarenal abdominal aorta is mildly ectatic and  irregular.  Stenosis greater than 70% is noted in bilateral iliac stents.  Bilateral common and external iliac arteries are widely patent.  Right Lower Extremity: The right common femoral artery is patent throughout its course.  No dominant profunda is identified.  The superficial femoral artery is widely patent as is the popliteal artery with three-vessel runoff  Left Lower Extremity: The common femoral profundofemoral and superficial femoral artery are widely patent.  There is three-vessel runoff.  Intervention: After the above images were acquired the decision was made to proceed with intervention.  I felt bilateral access was required.  I therefore used ultrasound to evaluate the right common femoral artery which was calcified but pulsatile.  1% lidocaine was used for local anesthesia.  The right common femoral artery was cannulated under ultrasound guidance with a micropuncture needle.  An 018 wire was advanced and the micropuncture sheath was placed.  The introducer was removed and a Bentson wire was advanced into the aorta under fluoroscopic visualization followed by a 6 [place] sheath.  The 5 French sheath on the left side was exchanged out for a 6 [place] sheath.  The patient was fully heparinized.  I then used a 7 x 20 balloon to perform balloon angioplasty throughout the stented area within bilateral common iliac arteries.  Completion imaging was then performed which showed resolution of the stenosis within the right common iliac stent as well as the left common iliac stent however there appeared to be some residual narrowing about 30 to 40% below the stent on the left.  I therefore reinserted the 7 x 20  balloon and performed balloon angioplasty of the distal left common iliac stent extending into the native common iliac artery.  This was done at 6 atm for 1 minute.  A completion image revealed significant improvement in the appearance of the area with stenosis of less than 10%.  At this point,  catheters and wires were removed.  The patient be taken to the holding area for sheath pull once her coagulation profile corrects.  Impression:  #1  Bilateral common iliac artery in-stent stenosis successfully treated using a 7 mm balloon with residual stenosis of less than 10%  #2  No significant outflow or runoff disease bilaterally  #3  No dominant profundofemoral artery visualized on the right   V. Annamarie Major, M.D. [facility] [place] Office: [phone] Pager:  [phone]

## 2017-07-16 NOTE — Discharge Instructions (Signed)

## 2017-07-16 NOTE — Progress Notes (Signed)
76fr sheath aspirated and removed from rfa by Amina Mouhamed. Manual pressure applied for 20 minutes. Groin level 0 tegaderm dressing applied. 12fr sheath aspirated and removed from LFA by Lance Bosch. Manual pressure applied for 20 minutes. Groin level 0 , tegaderm dressing applied.   Bedrest instructions given.Bilateral dp and pt pulses present with doppler.  Bedrest begins at 13:05:00

## 2017-07-16 NOTE — Interval H&P Note (Signed)
History and Physical Interval Note:  07/16/2017 9:14 AM  Tina Navarro  has presented today for surgery, with the diagnosis of pvd  The various methods of treatment have been discussed with the patient and family. After consideration of risks, benefits and other options for treatment, the patient has consented to  Procedure(s): LOWER EXTREMITY ANGIOGRAPHY (N/A) as a surgical intervention .  The patient's history has been reviewed, patient examined, no change in status, stable for surgery.  I have reviewed the patient's chart and labs.  Questions were answered to the patient's satisfaction.     Durene Cal

## 2017-07-17 ENCOUNTER — Encounter (HOSPITAL_COMMUNITY): Payer: Self-pay | Admitting: Surgery

## 2017-07-17 ENCOUNTER — Telehealth: Payer: Self-pay | Admitting: Surgery

## 2017-07-17 MED FILL — Heparin Sod (Porcine)-NaCl IV Soln 1000 Unit/500ML-0.9%: INTRAVENOUS | Qty: 1000 | Status: AC

## 2017-07-17 NOTE — Telephone Encounter (Signed)
sch appt 08/30/17 8am Aorta/iliac 9am ABI 915 F/u NP  s/p abd aortogram bil LE runoff, angioplaty bil iliac art

## 2017-07-17 NOTE — Telephone Encounter (Signed)
-----   Message from Westley Hummer, RN sent at 07/16/2017 11:40 AM EDT ----- Regarding: Appointment   ----- Message ----- From: Nada Libman, MD Sent: 07/16/2017  11:00 AM To: [facility] Charge Pool  07-16-2017:  Surgeon:  Durene Cal Procedure Performed:  1.  Ultrasound-guided access, left femoral artery  2.  Ultrasound-guided access, right femoral artery  3.  Abdominal aortogram  4.  Bilateral lower extremity runoff  5.  Angioplasty, left common iliac artery  6.  Angioplasty, right common iliac artery  7.  Conscious sedation (54 minutes)  Follow up 4-6 weeks with aorto iliac duplex and ABI with Rosalita Chessman

## 2017-07-24 ENCOUNTER — Other Ambulatory Visit: Payer: Self-pay

## 2017-07-24 DIAGNOSIS — Z48812 Encounter for surgical aftercare following surgery on the circulatory system: Secondary | ICD-10-CM

## 2017-07-24 DIAGNOSIS — I70213 Atherosclerosis of native arteries of extremities with intermittent claudication, bilateral legs: Secondary | ICD-10-CM

## 2017-07-24 DIAGNOSIS — I739 Peripheral vascular disease, unspecified: Secondary | ICD-10-CM

## 2017-08-15 ENCOUNTER — Other Ambulatory Visit: Payer: Self-pay

## 2017-08-15 DIAGNOSIS — I739 Peripheral vascular disease, unspecified: Secondary | ICD-10-CM

## 2017-08-15 MED ORDER — ATORVASTATIN CALCIUM 40 MG PO TABS: 40.0000 mg | ORAL_TABLET | Freq: Every day | ORAL | 3 refills | Status: DC

## 2017-08-30 ENCOUNTER — Ambulatory Visit: Payer: Federal, State, Local not specified - PPO | Admitting: Family

## 2017-08-30 ENCOUNTER — Ambulatory Visit (HOSPITAL_COMMUNITY)
Admission: RE | Admit: 2017-08-30 | Discharge: 2017-08-30 | Disposition: A | Discharge status: Home / Self Care | Payer: Federal, State, Local not specified - PPO | Source: Ambulatory Visit | Attending: Family | Admitting: Family

## 2017-08-30 ENCOUNTER — Encounter (HOSPITAL_COMMUNITY): Payer: Federal, State, Local not specified - PPO

## 2017-08-30 ENCOUNTER — Ambulatory Visit (INDEPENDENT_AMBULATORY_CARE_PROVIDER_SITE_OTHER)
Admission: RE | Admit: 2017-08-30 | Discharge: 2017-08-30 | Disposition: A | Discharge status: Home / Self Care | Payer: Federal, State, Local not specified - PPO | Source: Ambulatory Visit

## 2017-08-30 DIAGNOSIS — I739 Peripheral vascular disease, unspecified: Secondary | ICD-10-CM | POA: Diagnosis not present

## 2017-08-30 DIAGNOSIS — I70213 Atherosclerosis of native arteries of extremities with intermittent claudication, bilateral legs: Secondary | ICD-10-CM

## 2017-08-30 DIAGNOSIS — F17200 Nicotine dependence, unspecified, uncomplicated: Secondary | ICD-10-CM | POA: Diagnosis not present

## 2017-08-30 DIAGNOSIS — Z48812 Encounter for surgical aftercare following surgery on the circulatory system: Secondary | ICD-10-CM | POA: Diagnosis not present

## 2017-09-02 ENCOUNTER — Other Ambulatory Visit: Payer: Self-pay

## 2017-09-02 ENCOUNTER — Ambulatory Visit: Payer: Self-pay | Admitting: Family

## 2017-09-02 ENCOUNTER — Encounter: Payer: Self-pay | Admitting: Family

## 2017-09-02 VITALS — BP 140/80 | HR 86 | Temp 98.6°F | Resp 16 | Wt 170.0 lb

## 2017-09-02 DIAGNOSIS — I779 Disorder of arteries and arterioles, unspecified: Secondary | ICD-10-CM

## 2017-09-02 DIAGNOSIS — I771 Stricture of artery: Secondary | ICD-10-CM

## 2017-09-02 NOTE — Patient Instructions (Signed)
Steps to Quit Smoking Smoking tobacco can be bad for your health. It can also affect almost every organ in your body. Smoking puts you and people around you at risk for many serious long-lasting (chronic) diseases. Quitting smoking is hard, but it is one of the best things that you can do for your health. It is never too late to quit. What are the benefits of quitting smoking? When you quit smoking, you lower your risk for getting serious diseases and conditions. They can include:  Lung cancer or lung disease.  Heart disease.  Stroke.  Heart attack.  Not being able to have children (infertility).  Weak bones (osteoporosis) and broken bones (fractures).  If you have coughing, wheezing, and shortness of breath, those symptoms may get better when you quit. You may also get sick less often. If you are pregnant, quitting smoking can help to lower your chances of having a baby of low birth weight. What can I do to help me quit smoking? Talk with your doctor about what can help you quit smoking. Some things you can do (strategies) include:  Quitting smoking totally, instead of slowly cutting back how much you smoke over a period of time.  Going to in-person counseling. You are more likely to quit if you go to many counseling sessions.  Using resources and support systems, such as: ? Online chats with a counselor. ? Phone quitlines. ? Printed self-help materials. ? Support groups or group counseling. ? Text messaging programs. ? Mobile phone apps or applications.  Taking medicines. Some of these medicines may have nicotine in them. If you are pregnant or breastfeeding, do not take any medicines to quit smoking unless your doctor says it is okay. Talk with your doctor about counseling or other things that can help you.  Talk with your doctor about using more than one strategy at the same time, such as taking medicines while you are also going to in-person counseling. This can help make  quitting easier. What things can I do to make it easier to quit? Quitting smoking might feel very hard at first, but there is a lot that you can do to make it easier. Take these steps:  Talk to your family and friends. Ask them to support and encourage you.  Call phone quitlines, reach out to support groups, or work with a counselor.  Ask people who smoke to not smoke around you.  Avoid places that make you want (trigger) to smoke, such as: ? Bars. ? Parties. ? Smoke-break areas at work.  Spend time with people who do not smoke.  Lower the stress in your life. Stress can make you want to smoke. Try these things to help your stress: ? Getting regular exercise. ? Deep-breathing exercises. ? Yoga. ? Meditating. ? Doing a body scan. To do this, close your eyes, focus on one area of your body at a time from head to toe, and notice which parts of your body are tense. Try to relax the muscles in those areas.  Download or buy apps on your mobile phone or tablet that can help you stick to your quit plan. There are many free apps, such as QuitGuide from the CDC (Centers for Disease Control and Prevention). You can find more support from smokefree.gov and other websites.  This information is not intended to replace advice given to you by your health care provider. Make sure you discuss any questions you have with your health care provider. Document Released: 12/09/2008 Document   Revised: 10/11/2015 Document Reviewed: 06/29/2014 Elsevier Interactive Patient Education  2018 [employer] Inc.     Peripheral Vascular Disease Peripheral vascular disease (PVD) is a disease of the blood vessels that are not part of your heart and brain. A simple term for PVD is poor circulation. In most cases, PVD narrows the blood vessels that carry blood from your heart to the rest of your body. This can result in a decreased supply of blood to your arms, legs, and internal organs, like your stomach or kidneys.  However, it most often affects a person's lower legs and feet. There are two types of PVD.  Organic PVD. This is the more common type. It is caused by damage to the structure of blood vessels.  Functional PVD. This is caused by conditions that make blood vessels contract and tighten (spasm).  Without treatment, PVD tends to get worse over time. PVD can also lead to acute ischemic limb. This is when an arm or limb suddenly has trouble getting enough blood. This is a medical emergency. Follow these instructions at home:  Take medicines only as told by your doctor.  Do not use any tobacco products, including cigarettes, chewing tobacco, or electronic cigarettes. If you need help quitting, ask your doctor.  Lose weight if you are overweight, and maintain a healthy weight as told by your doctor.  Eat a diet that is low in fat and cholesterol. If you need help, ask your doctor.  Exercise regularly. Ask your doctor for some good activities for you.  Take good care of your feet. ? Wear comfortable shoes that fit well. ? Check your feet often for any cuts or sores. Contact a doctor if:  You have cramps in your legs while walking.  You have leg pain when you are at rest.  You have coldness in a leg or foot.  Your skin changes.  You are unable to get or have an erection (erectile dysfunction).  You have cuts or sores on your feet that are not healing. Get help right away if:  Your arm or leg turns cold and blue.  Your arms or legs become red, warm, swollen, painful, or numb.  You have chest pain or trouble breathing.  You suddenly have weakness in your face, arm, or leg.  You become very confused or you cannot speak.  You suddenly have a very bad headache.  You suddenly cannot see. This information is not intended to replace advice given to you by your health care provider. Make sure you discuss any questions you have with your health care provider. Document Released:  05/09/2009 Document Revised: 07/21/2015 Document Reviewed: 07/23/2013 Elsevier Interactive Patient Education  2017 Elsevier Inc.  

## 2017-09-02 NOTE — Progress Notes (Signed)
Postoperative Visit   History of Present Illness  Tina HailConnie Navarro is a 57 y.o. female who is s/p angioplasty of bilateral common iliac arteries on 07-16-17 by Dr. Myra GianottiBrabham for  bilateral legs claudication. The patient has a history of a intervention in 2007 for claudication at a different facility location.  She was having recurrent symptoms, similar to the one she had before the stents went in.    She comes in today for post angiography evaluation.   Pt states from another facility it is known that she has partial blockage of her left subclavian artery, and has weakness in her left hand when washing her hair, or when raking, and has to give her left arm a rest for a while. .   She denies any history of stroke or TIA.   The patient's left groin puncture site is healed.  The patient notes  resolution of lower extremity symptoms. The patient is able to complete their activities of daily living.    DM: no Tobacco use: current, x 35 years, 1/2 ppd  For VQI Use Only  PRE-ADM LIVING: Home  AMB STATUS: Ambulatory   Past Medical History:  Diagnosis Date  . Emphysematous COPD (HCC) 04/23/2017  . GERD (gastroesophageal reflux disease)   . Hyperlipidemia   . Peripheral arterial disease (HCC)   . Thyroid nodule 04/23/2017    Past Surgical History:  Procedure Laterality Date  . ABDOMINAL AORTOGRAM N/A 07/16/2017   Procedure: ABDOMINAL AORTOGRAM;  Surgeon: Nada LibmanBrabham, Vance W, MD;  Location: [facility];  Service: Cardiovascular;  Laterality: N/A;  . ENDOMETRIAL BIOPSY  2005  . FEMORAL ARTERY STENT Bilateral 2008  . LOWER EXTREMITY ANGIOGRAPHY Bilateral 07/16/2017   Procedure: Lower Extremity Angiography;  Surgeon: Nada LibmanBrabham, Vance W, MD;  [facility];  Service: Cardiovascular;  Laterality: Bilateral;  . NASAL SINUS SURGERY    . PERIPHERAL VASCULAR BALLOON ANGIOPLASTY Bilateral 07/16/2017   Procedure: PERIPHERAL VASCULAR BALLOON ANGIOPLASTY;  Surgeon: Nada LibmanBrabham, Vance W,  MD;  Location: [facility];  Service: Cardiovascular;  Laterality: Bilateral;  iliacs  . TONSILLECTOMY    . TUBAL LIGATION      Social History   Socioeconomic History  . Marital status: Married    Spouse name: Not on file  . Number of children: Not on file  . Years of education: Not on file  . Highest education level: Not on file  Occupational History  . Not on file  Social Needs  . Financial resource strain: Not on file  . Food insecurity:    Worry: Not on file    Inability: Not on file  . Transportation needs:    Medical: Not on file    Non-medical: Not on file  Tobacco Use  . Smoking status: Current Every Day Smoker    Packs/day: 0.50    Years: 35.00    Pack years: 17.50    Types: Cigarettes  . Smokeless tobacco: Never Used  . Tobacco comment: Less than 1 pk per day  Substance and Sexual Activity  . Alcohol use: No  . Drug use: No  . Sexual activity: Yes  Lifestyle  . Physical activity:    Days per week: Not on file    Minutes per session: Not on file  . Stress: Not on file  Relationships  . Social connections:    Talks on phone: Not on file    Gets together: Not on file    Attends religious service: Not on  file    Active member of club or organization: Not on file    Attends meetings of clubs or organizations: Not on file    Relationship status: Not on file  . Intimate partner violence:    Fear of current or ex partner: Not on file    Emotionally abused: Not on file    Physically abused: Not on file    Forced sexual activity: Not on file  Other Topics Concern  . Not on file  Social History Narrative  . Not on file    Allergies  Allergen Reactions  . Doxycycline Hives, Rash and Other (See Comments)    Pain, photosensitive    Current Outpatient Medications on File Prior to Visit  Medication Sig Dispense Refill  . aspirin 81 MG tablet Take 1 tablet (81 mg total) by mouth daily. 90 tablet 3  . Aspirin-Salicylamide-Caffeine (BC HEADACHE POWDER  PO) Take 1 packet by mouth daily as needed (for headache).     Marland Kitchen atorvastatin (LIPITOR) 40 MG tablet Take 1 tablet (40 mg total) by mouth at bedtime. 90 tablet 3  . clopidogrel (PLAVIX) 75 MG tablet Take 1 tablet (75 mg total) by mouth daily. 30 tablet 11  . estradiol (ESTRACE VAGINAL) 0.1 MG/GM vaginal cream Place 1 Applicatorful vaginally 3 (three) times a week. 42.5 g 12  . losartan (COZAAR) 100 MG tablet Take 1 tablet (100 mg total) by mouth daily. (Patient taking differently: Take 100 mg by mouth at bedtime. ) 90 tablet 1  . omeprazole (PRILOSEC) 40 MG capsule Take 1 capsule (40 mg total) by mouth 2 (two) times daily. 180 capsule 3  . promethazine (PHENERGAN) 12.5 MG tablet Take 1 tablet (12.5 mg total) by mouth every 6 (six) hours as needed for nausea or vomiting. Reported on 03/18/2015 30 tablet 2  . senna (SENOKOT) 8.6 MG tablet Take 2 tablets (17.2 mg total) by mouth daily. As needed for constipation (Patient taking differently: Take 2 tablets by mouth daily as needed for constipation. ) 90 tablet 1  . vitamin B-12 (CYANOCOBALAMIN) 1000 MCG tablet Take 1,000 mcg by mouth daily.      No current facility-administered medications on file prior to visit.      Physical Examination  Vitals:   09/02/17 1123 09/02/17 1127  BP: 110/70 140/80  Pulse: 86   Resp: 16   Temp: 98.6 F (37 C)   TempSrc: Oral   SpO2: 97%   Weight: 170 lb (77.1 kg)    Body mass index is 28.29 kg/m.  PHYSICAL EXAMINATION: General: The patient appears her stated age.   HEENT:  No gross abnormalities. PERRLA Pulmonary: Respirations are non-labored Abdomen: Soft and non-tender with. Musculoskeletal: There are no major deformities.   Neurologic: No focal weakness or paresthesias are detected, CN 2-12 intact Skin: There are no ulcer or rashes noted. Psychiatric: The patient has normal affect. Cardiovascular: There is a regular rate and rhythm without significant murmur appreciated.   Vascular: Vessel Right  Left  Radial 1+Palpable Palpable  Ulnar Palpable Palpable  Brachial Palpable Palpable  Carotid Palpable, without bruit Palpable, without bruit  Aorta Not palpable N/A  Femoral 2+Palpable 2+Palpable  Popliteal Not palpable Not palpable  PT 2+ Palpable 2+ Palpable  DP 2+ Palpable 1+ Palpable     DATA  Bilateral Aortoiliac Duplex (08-30-17): Patent bilateral common iliac artery stents with slight increased of velocity in the right proximal stent (224 cm/s), may be due to slight curve. All triphasic waveforms bilaterally except biphasic  proximal to stent and proximal stent on the right. Distal aorta 75.2 cms. Right CFA 141 cm/s triphasic waveform. Left CFA 141 cm/s triphasic waveform.    ABI (Date: 08-30-17):  R:   ABI: 1.02 (was 0.89 on 06-24-17),   PT: tri  DP: tri  TBI:  0.82  L:   ABI: 1.06 (was 1.08),   PT: tri  DP: tri  TBI: 0.82  Right: Resting right ankle-brachial index is within normal range. No evidence of significant right lower extremity arterial disease. The right toe-brachial index is normal.  Left: Resting left ankle-brachial index is within normal range. No evidence of significant left lower extremity arterial disease. The left toe-brachial index is normal.   Medical Decision Making  Tina Navarro is a 57 y.o. female who  is s/p angioplasty of bilateral common iliac arteries on 07-16-17 by Dr. Myra Gianotti for  bilateral legs claudication. The patient has a history of a intervention in 2007 for claudication at a different facility location.  She no longer has any claudication symptoms with walking.   Pt states from another facility it is known that she has partial blockage of her left subclavian artery, and has weakness in her left hand when washing her hair, or when raking, and has to give her left arm a rest for a while. .   She denies any history of stroke or TIA.    She has no bleeding issues taking 81 mg ASA daily and Plavix; pt asked when she can  come off Plavix; I advised dual antiplatelet therapy until she quits smokomg, since tobacco use is a potent atherosclerotic risk factor.   Based on non invasive ultrasound results, physical exam, and HPI today, pt to return in 3 months with carotid duplex, bilateral iliac artery duplex, and ABI'.s.    Advised walk at least 30 minutes daily in a safe environment.   Over 3 minutes was spent counseling patient re smoking cessation, and patient was given several free resources re smoking cessation.    I discussed in depth with the patient the nature of atherosclerosis, and emphasized the importance of maximal medical management including strict control of blood pressure, blood glucose, and lipid levels, obtaining regular exercise, and cessation of smoking.  The patient is aware that without maximal medical management the underlying atherosclerotic disease process will progress, limiting the benefit of any interventions. The patient is currently on a statin The patient is currently on an anti-platelet: ASA 81 mg and Plavix daily.  Thank you for allowing [place] to participate in this patient's care.  Charisse March, RN, MSN, FNP-C [facility] Office: [phone]  09/02/2017, 11:48 AM  Clinic MD: Myra Gianotti

## 2017-09-04 ENCOUNTER — Other Ambulatory Visit: Payer: Self-pay

## 2017-09-04 DIAGNOSIS — I70213 Atherosclerosis of native arteries of extremities with intermittent claudication, bilateral legs: Secondary | ICD-10-CM

## 2017-09-04 DIAGNOSIS — I771 Stricture of artery: Secondary | ICD-10-CM

## 2017-09-04 DIAGNOSIS — I779 Disorder of arteries and arterioles, unspecified: Secondary | ICD-10-CM

## 2017-09-04 DIAGNOSIS — I739 Peripheral vascular disease, unspecified: Secondary | ICD-10-CM

## 2017-10-09 DIAGNOSIS — K080 Exfoliation of teeth due to systemic causes: Secondary | ICD-10-CM | POA: Diagnosis not present

## 2017-11-10 DIAGNOSIS — Z79899 Other long term (current) drug therapy: Secondary | ICD-10-CM | POA: Diagnosis not present

## 2017-11-10 DIAGNOSIS — M791 Myalgia, unspecified site: Secondary | ICD-10-CM | POA: Diagnosis not present

## 2017-11-10 DIAGNOSIS — I10 Essential (primary) hypertension: Secondary | ICD-10-CM | POA: Diagnosis not present

## 2017-11-10 DIAGNOSIS — Z7982 Long term (current) use of aspirin: Secondary | ICD-10-CM | POA: Diagnosis not present

## 2017-11-10 DIAGNOSIS — Z881 Allergy status to other antibiotic agents status: Secondary | ICD-10-CM | POA: Diagnosis not present

## 2017-11-10 DIAGNOSIS — R079 Chest pain, unspecified: Secondary | ICD-10-CM | POA: Diagnosis not present

## 2017-11-10 DIAGNOSIS — F17210 Nicotine dependence, cigarettes, uncomplicated: Secondary | ICD-10-CM | POA: Diagnosis not present

## 2017-11-10 DIAGNOSIS — F419 Anxiety disorder, unspecified: Secondary | ICD-10-CM | POA: Diagnosis not present

## 2017-11-10 DIAGNOSIS — E785 Hyperlipidemia, unspecified: Secondary | ICD-10-CM | POA: Diagnosis not present

## 2017-11-10 DIAGNOSIS — R9431 Abnormal electrocardiogram [ECG] [EKG]: Secondary | ICD-10-CM | POA: Diagnosis not present

## 2017-11-10 DIAGNOSIS — R0789 Other chest pain: Secondary | ICD-10-CM | POA: Diagnosis not present

## 2017-11-11 ENCOUNTER — Encounter: Payer: Self-pay | Admitting: Osteopathic Medicine

## 2017-11-11 ENCOUNTER — Ambulatory Visit (INDEPENDENT_AMBULATORY_CARE_PROVIDER_SITE_OTHER): Payer: Federal, State, Local not specified - PPO | Admitting: Osteopathic Medicine

## 2017-11-11 VITALS — BP 143/77 | HR 83 | Temp 98.4°F | Wt 166.8 lb

## 2017-11-11 DIAGNOSIS — R109 Unspecified abdominal pain: Secondary | ICD-10-CM

## 2017-11-11 MED ORDER — GABAPENTIN 300 MG PO CAPS: 300.0000 mg | ORAL_CAPSULE | Freq: Three times a day (TID) | ORAL | 1 refills | Status: DC

## 2017-11-11 NOTE — Progress Notes (Signed)
HPI: Tina Navarro is a 57 y.o. female who  has a past medical history of Emphysematous COPD (HCC) (04/23/2017), GERD (gastroesophageal reflux disease), Hyperlipidemia, Peripheral arterial disease (HCC), and Thyroid nodule (04/23/2017).  [facility] today, 11/11/17,  for chief complaint of: Follow-up from emergency room - she walked in after urgent care wait was too long  ER records reviewed, was seen yesterday.  Presented with complaint of left chest pain and back pain x2 weeks, typically comes and goes, but resented to the ER because it did not seem to be stopping.  EKG, unable to review tracing but demonstrated normal sinus rhythm, low voltage QRS, nonspecific ST abnormality. Chest x-ray was normal.  Opponents were negative.  Slight hyponatremia, hypokalemia.  No concerns on CBC.  Diagnosis most likely musculoskeletal pain, patient was however also discharged with a prescription for Keflex 500 mg 3 times daily x7 days for insect bite on the left middle back, also given a prescription for tramadol to use as needed.  Today patient reports:  Location/Quality: Burning, sharp pain wrapping in a band from mid back around to the front just under the left breast, goes up a little bit toward the axilla, feels superficial.  No chest pain on exertion, no chest pain with breathing. . Context: No injury . Severity: reports severe at 5/10 at the worst . Duration: Couple of weeks . Timing: started feeling worse with tenderness to the touch/with lifting but now is fairly tender when she is leaning back  . Assoc signs/symptoms: 3 spots on her back presumably healing mosquito bites, she is unsure if these were ever blistering  Patient is accompanied by husband who assists with history-taking.    Immunization History  Administered Date(s) Administered  . Tdap 12/11/2005     Past medical, surgical, social and family history reviewed:  Patient Active  Problem List   Diagnosis Date Noted  . Thyroid nodule 04/23/2017  . Emphysematous COPD (HCC) 04/23/2017  . Rib pain on left side 04/23/2017  . Neuropathy involving both lower extremities 04/22/2017  . Pleuritic chest pain 04/22/2017  . Essential hypertension 05/01/2016  . Abnormal findings on esophagogastroduodenoscopy (EGD) 03/18/2016  . BPPV (benign paroxysmal positional vertigo) 06/28/2015  . Irritable bowel syndrome 04/01/2015  . Dyslipidemia, goal LDL below 70 04/01/2015  . Current tobacco use 07/28/2012  . Multinodular goiter 12/19/2011  . CN (constipation) 07/28/2011  . Dyslipidemia 07/28/2011  . Acid reflux 09/25/2006  . Peripheral vascular disease (HCC) 09/25/2006    Past Surgical History:  Procedure Laterality Date  . ABDOMINAL AORTOGRAM N/A 07/16/2017   Procedure: ABDOMINAL AORTOGRAM;  Surgeon: Nada LibmanBrabham, Vance W, MD;  Location: [facility];  Service: Cardiovascular;  Laterality: N/A;  . ENDOMETRIAL BIOPSY  2005  . FEMORAL ARTERY STENT Bilateral 2008  . LOWER EXTREMITY ANGIOGRAPHY Bilateral 07/16/2017   Procedure: Lower Extremity Angiography;  Surgeon: Nada LibmanBrabham, Vance W, MD;  [facility];  Service: Cardiovascular;  Laterality: Bilateral;  . NASAL SINUS SURGERY    . PERIPHERAL VASCULAR BALLOON ANGIOPLASTY Bilateral 07/16/2017   Procedure: PERIPHERAL VASCULAR BALLOON ANGIOPLASTY;  Surgeon: Nada LibmanBrabham, Vance W, MD;  Location: [facility];  Service: Cardiovascular;  Laterality: Bilateral;  iliacs  . TONSILLECTOMY    . TUBAL LIGATION      Social History   Tobacco Use  . Smoking status: Current Every Day Smoker    Packs/day: 0.50    Years: 35.00    Pack years: 17.50  Types: Cigarettes  . Smokeless tobacco: Never Used  . Tobacco comment: Less than 1 pk per day  Substance Use Topics  . Alcohol use: No    Family History  Problem Relation Age of Onset  . Hypertension Mother   . Hypertension Father   . Heart disease Father   . Peripheral  vascular disease Father   . Alcohol abuse Father   . Cancer Maternal Grandmother        BREAST  . Stroke Maternal Grandmother        CEBERAL ATHEROSCLEROSIS  . Cancer - Ovarian Cousin   . Fibroids Sister      Current medication list and allergy/intolerance information reviewed:    Current Outpatient Medications  Medication Sig Dispense Refill  . aspirin 81 MG tablet Take 1 tablet (81 mg total) by mouth daily. 90 tablet 3  . Aspirin-Salicylamide-Caffeine (BC HEADACHE POWDER PO) Take 1 packet by mouth daily as needed (for headache).     Marland Kitchen atorvastatin (LIPITOR) 40 MG tablet Take 1 tablet (40 mg total) by mouth at bedtime. 90 tablet 3  . clopidogrel (PLAVIX) 75 MG tablet Take 1 tablet (75 mg total) by mouth daily. 30 tablet 11  . estradiol (ESTRACE VAGINAL) 0.1 MG/GM vaginal cream Place 1 Applicatorful vaginally 3 (three) times a week. 42.5 g 12  . losartan (COZAAR) 100 MG tablet Take 1 tablet (100 mg total) by mouth daily. (Patient taking differently: Take 100 mg by mouth at bedtime. ) 90 tablet 1  . omeprazole (PRILOSEC) 40 MG capsule Take 1 capsule (40 mg total) by mouth 2 (two) times daily. 180 capsule 3  . promethazine (PHENERGAN) 12.5 MG tablet Take 1 tablet (12.5 mg total) by mouth every 6 (six) hours as needed for nausea or vomiting. Reported on 03/18/2015 30 tablet 2  . senna (SENOKOT) 8.6 MG tablet Take 2 tablets (17.2 mg total) by mouth daily. As needed for constipation (Patient taking differently: Take 2 tablets by mouth daily as needed for constipation. ) 90 tablet 1  . vitamin B-12 (CYANOCOBALAMIN) 1000 MCG tablet Take 1,000 mcg by mouth daily.      No current facility-administered medications for this visit.     Allergies  Allergen Reactions  . Doxycycline Hives, Rash and Other (See Comments)    Pain, photosensitive      Review of Systems:  Constitutional:  No  fever, no chills  HEENT: No  headache, no vision change  Cardiac: No  chest pain, No  pressure, No  palpitations, No  Orthopnea  Respiratory:  No  shortness of breath. No  Cough  Gastrointestinal: No  abdominal pain, No  nausea  Musculoskeletal: No new myalgia/arthralgia  Skin: +Rash as per HPI, No other wounds/concerning lesions  Hem/Onc: No  easy bruising/bleeding  Neurologic: No  weakness, No  dizziness, No  slurred speech/focal weakness/facial droop  Psychiatric: No  concerns with depression, No  concerns with anxiety, No sleep problems, No mood problems  Exam:  BP (!) 143/77 (BP Location: Right Arm, Patient Position: Sitting, Cuff Size: Normal)   Pulse 83   Temp 98.4 F (36.9 C) (Oral)   Wt 166 lb 12.8 oz (75.7 kg)   LMP  (LMP Unknown)   BMI 27.76 kg/m   Constitutional: VS see above. General Appearance: alert, well-developed, well-nourished, NAD  Eyes: Normal lids and conjunctive, non-icteric sclera  Ears, Nose, Mouth, Throat: MMM, Normal external inspection ears/nares/mouth/lips/gums..   Neck: No masses, trachea midline. No thyroid enlargement.   Respiratory: Normal respiratory  effort. no wheeze, no rhonchi, no rales  Cardiovascular: S1/S2 normal, no murmur, no rub/gallop auscultated. RRR. No lower extremity edema.   Gastrointestinal: Nontender, no masses.   Musculoskeletal: Gait normal. No clubbing/cyanosis of digits.   Neurological: Normal balance/coordination. No tremor. No cranial nerve deficit on limited exam. Motor and sensation intact and symmetric. Cerebellar reflexes intact.   Skin: warm, dry, intact.  Healing lesion on left back x3 consistent with mild, healing mosquito bites.  No apparent ulceration, erythema  Psychiatric: Normal judgment/insight. Normal mood and affect. Oriented x3.      ASSESSMENT/PLAN:   Side pain -differential diagnosis seems most likely to include musculoskeletal strain, though this could be an abnormal presentation of shingles given the dermatomal Vusion and burning pain/tenderness.  Does not sound like she ever had a  full-blown rash not sure if the scutal bite lesions are truly mosquito bites or possibly just isolated blisters.  At any rate, can trial treating for neuropathic pain with gabapentin, would consider cyclobenzaprine.  If these are not helping, or if symptoms worsen or fail to improve, would refer to cardiology for stress test consideration.    Patient Instructions  Will try Gabapentin for possible nerve pain due to singles  May consider muscle relaxer for muscle strain May consider referral to cardiology for discussion of possible stress test Message/Call me by end of the week to let me know how you're doing!     Visit summary with medication list and pertinent instructions was printed for patient to review. All questions at time of visit were answered - patient instructed to contact office with any additional concerns. ER/RTC precautions were reviewed with the patient.   Follow-up plan: Return if symptoms worsen or fail to improve, otherwise recheck 1 week .    Please note: voice recognition software was used to produce this document, and typos may escape review. Please contact Dr. Lyn Hollingshead for any needed clarifications.

## 2017-11-11 NOTE — Patient Instructions (Addendum)
Will try Gabapentin for possible nerve pain due to singles  May consider muscle relaxer for muscle strain May consider referral to cardiology for discussion of possible stress test Message/Call me by end of the week to let me know how you're doing!

## 2017-12-11 ENCOUNTER — Other Ambulatory Visit: Payer: Self-pay | Admitting: Osteopathic Medicine

## 2017-12-11 NOTE — Telephone Encounter (Signed)
Please review for refill- patient at [facility] 

## 2017-12-16 ENCOUNTER — Ambulatory Visit (HOSPITAL_COMMUNITY)
Admission: RE | Admit: 2017-12-16 | Discharge: 2017-12-16 | Disposition: A | Discharge status: Home / Self Care | Payer: Federal, State, Local not specified - PPO | Source: Ambulatory Visit | Attending: Surgery | Admitting: Surgery

## 2017-12-16 ENCOUNTER — Ambulatory Visit: Payer: Federal, State, Local not specified - PPO | Admitting: Family

## 2017-12-16 ENCOUNTER — Ambulatory Visit (INDEPENDENT_AMBULATORY_CARE_PROVIDER_SITE_OTHER)
Admission: RE | Admit: 2017-12-16 | Discharge: 2017-12-16 | Disposition: A | Discharge status: Home / Self Care | Payer: Federal, State, Local not specified - PPO | Source: Ambulatory Visit | Attending: Family | Admitting: Family

## 2017-12-16 ENCOUNTER — Encounter: Payer: Self-pay | Admitting: Family

## 2017-12-16 VITALS — BP 122/82 | HR 85 | Temp 98.6°F | Resp 16 | Ht 65.0 in | Wt 169.4 lb

## 2017-12-16 DIAGNOSIS — I6523 Occlusion and stenosis of bilateral carotid arteries: Secondary | ICD-10-CM | POA: Diagnosis not present

## 2017-12-16 DIAGNOSIS — I70213 Atherosclerosis of native arteries of extremities with intermittent claudication, bilateral legs: Secondary | ICD-10-CM | POA: Insufficient documentation

## 2017-12-16 DIAGNOSIS — I739 Peripheral vascular disease, unspecified: Secondary | ICD-10-CM

## 2017-12-16 DIAGNOSIS — F17200 Nicotine dependence, unspecified, uncomplicated: Secondary | ICD-10-CM | POA: Diagnosis not present

## 2017-12-16 DIAGNOSIS — I779 Disorder of arteries and arterioles, unspecified: Secondary | ICD-10-CM

## 2017-12-16 DIAGNOSIS — I771 Stricture of artery: Secondary | ICD-10-CM | POA: Insufficient documentation

## 2017-12-16 NOTE — Progress Notes (Signed)
[facility] [place]   CC: Follow up peripheral artery occlusive disease  History of Present Illness Tina Navarro is a 57 y.o. female who is s/p angioplasty of bilateral common iliac arteries on 07-16-17 by Dr. Myra Gianotti for  bilateral legs claudication. The patient has a history of a intervention in 2007 for claudication at a different facility location. She was having recurrent symptoms, similar to the one she had before the stents went in.   She comes in today for follow up.   Pt states from another facility it is known that she has partial blockage of her left subclavian artery, and has weakness in her left hand when washing her hair, or when raking, and has to give her left arm a rest for a while. .   She denies any history of stroke or TIA.   The patient notes  resolution of lower extremity symptoms.    Diabetic: No Tobacco use: smoker  (x 35 years, 1/2 ppd)  Pt meds include: Statin :Yes Betablocker: No ASA: Yes Other anticoagulants/antiplatelets: Plavix   Past Medical History:  Diagnosis Date  . Emphysematous COPD (HCC) 04/23/2017  . GERD (gastroesophageal reflux disease)   . Hyperlipidemia   . Peripheral arterial disease (HCC)   . Thyroid nodule 04/23/2017    Social History Social History   Tobacco Use  . Smoking status: Light Tobacco Smoker    Packs/day: 0.50    Years: 35.00    Pack years: 17.50    Types: Cigarettes  . Smokeless tobacco: Never Used  . Tobacco comment: Less than 1 pk per day  Substance Use Topics  . Alcohol use: No  . Drug use: No    Family History Family History  Problem Relation Age of Onset  . Hypertension Mother   . Hypertension Father   . Heart disease Father   . Peripheral vascular disease Father   . Alcohol abuse Father   . Cancer Maternal Grandmother        BREAST  . Stroke Maternal Grandmother        CEBERAL ATHEROSCLEROSIS  . Cancer - Ovarian Cousin   . Fibroids Sister     Past Surgical  History:  Procedure Laterality Date  . ABDOMINAL AORTOGRAM N/A 07/16/2017   Procedure: ABDOMINAL AORTOGRAM;  Surgeon: Nada Libman, MD;  Location: [facility] INVASIVE CV LAB;  Service: Cardiovascular;  Laterality: N/A;  . ENDOMETRIAL BIOPSY  2005  . FEMORAL ARTERY STENT Bilateral 2008  . LOWER EXTREMITY ANGIOGRAPHY Bilateral 07/16/2017   Procedure: Lower Extremity Angiography;  Surgeon: Nada Libman, MD;  [facility] INVASIVE CV LAB;  Service: Cardiovascular;  Laterality: Bilateral;  . NASAL SINUS SURGERY    . PERIPHERAL VASCULAR BALLOON ANGIOPLASTY Bilateral 07/16/2017   Procedure: PERIPHERAL VASCULAR BALLOON ANGIOPLASTY;  Surgeon: Nada Libman, MD;  Location: [facility] INVASIVE CV LAB;  Service: Cardiovascular;  Laterality: Bilateral;  iliacs  . TONSILLECTOMY    . TUBAL LIGATION      Allergies  Allergen Reactions  . Doxycycline Hives, Rash and Other (See Comments)    Pain, photosensitive    Current Outpatient Medications  Medication Sig Dispense Refill  . aspirin 81 MG tablet Take 1 tablet (81 mg total) by mouth daily. 90 tablet 3  . Aspirin-Salicylamide-Caffeine (BC HEADACHE POWDER PO) Take 1 packet by mouth daily as needed (for headache).     Marland Kitchen atorvastatin (LIPITOR) 40 MG tablet Take 1 tablet (40 mg total) by mouth at bedtime. 90 tablet 3  . clopidogrel (  PLAVIX) 75 MG tablet Take 1 tablet (75 mg total) by mouth daily. 30 tablet 11  . estradiol (ESTRACE VAGINAL) 0.1 MG/GM vaginal cream Place 1 Applicatorful vaginally 3 (three) times a week. 42.5 g 12  . gabapentin (NEURONTIN) 300 MG capsule Take 1-2 capsules (300-600 mg total) by mouth 3 (three) times daily. 90 capsule 1  . losartan (COZAAR) 100 MG tablet Take 1 tablet (100 mg total) by mouth daily. Due for follow up 90 tablet 0  . omeprazole (PRILOSEC) 40 MG capsule Take 1 capsule (40 mg total) by mouth 2 (two) times daily. 180 capsule 3  . promethazine (PHENERGAN) 12.5 MG tablet Take 1 tablet (12.5 mg total) by mouth every 6 (six)  hours as needed for nausea or vomiting. Reported on 03/18/2015 30 tablet 2  . senna (SENOKOT) 8.6 MG tablet Take 2 tablets (17.2 mg total) by mouth daily. As needed for constipation (Patient taking differently: Take 2 tablets by mouth daily as needed for constipation. ) 90 tablet 1  . vitamin B-12 (CYANOCOBALAMIN) 1000 MCG tablet Take 1,000 mcg by mouth daily.      No current facility-administered medications for this visit.     ROS: See HPI for pertinent positives and negatives.   Physical Examination  Vitals:   12/16/17 1126 12/16/17 1127  BP: (!) 175/82 122/82  Pulse: 85 85  Resp: 16   Temp: 98.6 F (37 C)   TempSrc: Oral   SpO2: 100% 100%  Weight: 169 lb 6.4 oz (76.8 kg)   Height: 5\' 5"  (1.651 m)    Body mass index is 28.19 kg/m.   General: The patient appears her stated age.   Gait: normal HENT: No gross abnormalities.  Eyes: PERRLA. Pulmonary: Respirations are non-labored Abdomen: Soft and non-tender with. Musculoskeletal: There are no major deformities.   Neurologic: No focal weakness or paresthesias are detected, CN 2-12 intact Skin: no rashes, no cellulitis, no ulcers noted.  Psychiatric: Thought content is normal, mood appropriate for clinical situation.   Cardiovascular: There is a regular rate and rhythm without significant murmur appreciated.   Vascular: Vessel Right Left  Radial 1+Palpable Not Palpable  Ulnar Not Palpable Palpable  Brachial Palpable Not Palpable  Carotid Palpable, without bruit Palpable, without bruit  Aorta Not palpable N/A  Femoral 2+Palpable 2+Palpable  Popliteal Not palpable Not palpable  PT 2+ Palpable 2+ Palpable  DP 2+ Palpable 2+ Palpable     ASSESSMENT: Tina Navarro is a 57 y.o. female who is s/p angioplasty of bilateral common iliac arteries on 07-16-17 by Dr. Myra Gianotti for  bilateral legs claudication. The patient has a history of a intervention in 2007 for claudication at a different facility location.  She no longer  has any claudication symptoms with walking.   Pt states from another facility it is known that she has partial blockage of her left subclavian artery, and has weakness in her left hand when washing her hair, or when raking, and has to give her left arm a rest for a while.  There is a 50 mm Hg gradient difference in her systolic pressures, right arm is the correct blood pressure, left is falsely low due to left subclavian stenosis.    I advised pt to recheck her blood pressure in her right upper arm at her pharmacy, since she does not have a blood pressure cuff, and to notify her PCP if her pressure is >150/90.  She is asymptomatic of her currently elevated blood pressure.   She denies any history  of stroke or TIA.  Moderate bilateral ICA stenosis on carotid duplex today.   She has no bleeding issues taking 81 mg ASA daily and Plavix; pt asked when she can come off Plavix; I advised dual antiplatelet therapy until she quits smokomg, since tobacco use is a potent atherosclerotic risk factor.     DATA  Carotid Duplex (12-16-17): 40-59% bilateral ICA stenosis Right vertebral artery flow is antegrade, left is antegrade dampened.  Right Brachial index 109 cm/s triphasic waveform. Left Brachial index monophasic waveform 55.1 c,/s. Known subclavian artery occlusive disease.  No previous exam for comparison.   Bilateral Aortoiliac Duplex (12-16-17): Highest velocity is 232 cm/s at right distal stent. Right CFA 124 cm/s, left CFA 177cm/s. both with triphasic waveforms.  No stenosis in the left stent. All biphasic waveforms bilaterally.  Decline in waveform morphology from tri to biphasic compared to the exam on 08-30-17, otherwise no significant change.    ABI (Date: 12/16/2017):  R:   ABI: 1.22 (was 1.02 on 08-30-17),   PT: tri  DP: tri  TBI:  0.99, toe pressure 146, (was 0.82)  L:   ABI: 1.26 (was 1.06),   PT: tri  DP: tri  TBI: 1.06, toe pressure 157, (was 0.82) Bilateral  ABI remain normal with all triphasic waveforms.  Bilateral TBI remain normal and improved.     PLAN:   Based on non invasive ultrasound results, physical exam, and HPI today, pt to return in 3 months with bilateral iliac artery duplex and ABI'.s.   Carotid duplex in a year.   Advised continued to walk at least 30 minutes daily in a safe environment.   Counseled patient re smoking cessation, and patient was given several free resources re smoking cessation at her previous visit.   I discussed in depth with the patient the nature of atherosclerosis, and emphasized the importance of maximal medical management including strict control of blood pressure, blood glucose, and lipid levels, obtaining regular exercise, and cessation of smoking.  The patient is aware that without maximal medical management the underlying atherosclerotic disease process will progress, limiting the benefit of any interventions.  The patient was given information about PAD including signs, symptoms, treatment, what symptoms should prompt the patient to seek immediate medical care, and risk reduction measures to take.  Charisse March, RN, MSN, FNP-C [facility] [place] Phone: [phone]  Clinic MD: Myra Gianotti  12/16/17 11:28 AM

## 2017-12-16 NOTE — Patient Instructions (Signed)
Steps to Quit Smoking Smoking tobacco can be bad for your health. It can also affect almost every organ in your body. Smoking puts you and people around you at risk for many serious long-lasting (chronic) diseases. Quitting smoking is hard, but it is one of the best things that you can do for your health. It is never too late to quit. What are the benefits of quitting smoking? When you quit smoking, you lower your risk for getting serious diseases and conditions. They can include:  Lung cancer or lung disease.  Heart disease.  Stroke.  Heart attack.  Not being able to have children (infertility).  Weak bones (osteoporosis) and broken bones (fractures).  If you have coughing, wheezing, and shortness of breath, those symptoms may get better when you quit. You may also get sick less often. If you are pregnant, quitting smoking can help to lower your chances of having a baby of low birth weight. What can I do to help me quit smoking? Talk with your doctor about what can help you quit smoking. Some things you can do (strategies) include:  Quitting smoking totally, instead of slowly cutting back how much you smoke over a period of time.  Going to in-person counseling. You are more likely to quit if you go to many counseling sessions.  Using resources and support systems, such as: ? Online chats with a counselor. ? Phone quitlines. ? Printed self-help materials. ? Support groups or group counseling. ? Text messaging programs. ? Mobile phone apps or applications.  Taking medicines. Some of these medicines may have nicotine in them. If you are pregnant or breastfeeding, do not take any medicines to quit smoking unless your doctor says it is okay. Talk with your doctor about counseling or other things that can help you.  Talk with your doctor about using more than one strategy at the same time, such as taking medicines while you are also going to in-person counseling. This can help make  quitting easier. What things can I do to make it easier to quit? Quitting smoking might feel very hard at first, but there is a lot that you can do to make it easier. Take these steps:  Talk to your family and friends. Ask them to support and encourage you.  Call phone quitlines, reach out to support groups, or work with a counselor.  Ask people who smoke to not smoke around you.  Avoid places that make you want (trigger) to smoke, such as: ? Bars. ? Parties. ? Smoke-break areas at work.  Spend time with people who do not smoke.  Lower the stress in your life. Stress can make you want to smoke. Try these things to help your stress: ? Getting regular exercise. ? Deep-breathing exercises. ? Yoga. ? Meditating. ? Doing a body scan. To do this, close your eyes, focus on one area of your body at a time from head to toe, and notice which parts of your body are tense. Try to relax the muscles in those areas.  Download or buy apps on your mobile phone or tablet that can help you stick to your quit plan. There are many free apps, such as QuitGuide from the CDC (Centers for Disease Control and Prevention). You can find more support from smokefree.gov and other websites.  This information is not intended to replace advice given to you by your health care provider. Make sure you discuss any questions you have with your health care provider. Document Released: 12/09/2008 Document   Revised: 10/11/2015 Document Reviewed: 06/29/2014 Elsevier Interactive Patient Education  2018 [employer] Inc.     Peripheral Vascular Disease Peripheral vascular disease (PVD) is a disease of the blood vessels that are not part of your heart and brain. A simple term for PVD is poor circulation. In most cases, PVD narrows the blood vessels that carry blood from your heart to the rest of your body. This can result in a decreased supply of blood to your arms, legs, and internal organs, like your stomach or kidneys.  However, it most often affects a person's lower legs and feet. There are two types of PVD.  Organic PVD. This is the more common type. It is caused by damage to the structure of blood vessels.  Functional PVD. This is caused by conditions that make blood vessels contract and tighten (spasm).  Without treatment, PVD tends to get worse over time. PVD can also lead to acute ischemic limb. This is when an arm or limb suddenly has trouble getting enough blood. This is a medical emergency. Follow these instructions at home:  Take medicines only as told by your doctor.  Do not use any tobacco products, including cigarettes, chewing tobacco, or electronic cigarettes. If you need help quitting, ask your doctor.  Lose weight if you are overweight, and maintain a healthy weight as told by your doctor.  Eat a diet that is low in fat and cholesterol. If you need help, ask your doctor.  Exercise regularly. Ask your doctor for some good activities for you.  Take good care of your feet. ? Wear comfortable shoes that fit well. ? Check your feet often for any cuts or sores. Contact a doctor if:  You have cramps in your legs while walking.  You have leg pain when you are at rest.  You have coldness in a leg or foot.  Your skin changes.  You are unable to get or have an erection (erectile dysfunction).  You have cuts or sores on your feet that are not healing. Get help right away if:  Your arm or leg turns cold and blue.  Your arms or legs become red, warm, swollen, painful, or numb.  You have chest pain or trouble breathing.  You suddenly have weakness in your face, arm, or leg.  You become very confused or you cannot speak.  You suddenly have a very bad headache.  You suddenly cannot see. This information is not intended to replace advice given to you by your health care provider. Make sure you discuss any questions you have with your health care provider. Document Released:  05/09/2009 Document Revised: 07/21/2015 Document Reviewed: 07/23/2013 Elsevier Interactive Patient Education  2017 Elsevier Inc.  

## 2017-12-19 ENCOUNTER — Other Ambulatory Visit: Payer: Self-pay | Admitting: Osteopathic Medicine

## 2017-12-19 NOTE — Telephone Encounter (Signed)
[facility] does not refill for this provider. 

## 2017-12-20 ENCOUNTER — Other Ambulatory Visit: Payer: Self-pay

## 2017-12-20 DIAGNOSIS — I70213 Atherosclerosis of native arteries of extremities with intermittent claudication, bilateral legs: Secondary | ICD-10-CM

## 2017-12-20 DIAGNOSIS — I739 Peripheral vascular disease, unspecified: Secondary | ICD-10-CM

## 2017-12-20 DIAGNOSIS — I779 Disorder of arteries and arterioles, unspecified: Secondary | ICD-10-CM

## 2017-12-25 ENCOUNTER — Ambulatory Visit (INDEPENDENT_AMBULATORY_CARE_PROVIDER_SITE_OTHER): Payer: Federal, State, Local not specified - PPO | Admitting: Osteopathic Medicine

## 2017-12-25 ENCOUNTER — Encounter: Payer: Self-pay | Admitting: Osteopathic Medicine

## 2017-12-25 VITALS — BP 118/63 | HR 81 | Temp 98.1°F | Wt 168.4 lb

## 2017-12-25 DIAGNOSIS — F411 Generalized anxiety disorder: Secondary | ICD-10-CM | POA: Diagnosis not present

## 2017-12-25 DIAGNOSIS — L244 Irritant contact dermatitis due to drugs in contact with skin: Secondary | ICD-10-CM | POA: Diagnosis not present

## 2017-12-25 MED ORDER — GABAPENTIN 300 MG PO CAPS: 600.0000 mg | ORAL_CAPSULE | Freq: Three times a day (TID) | ORAL | 1 refills | Status: DC

## 2017-12-25 NOTE — Patient Instructions (Signed)
   OK to keep taking Gabapentin - call me if you ever want to come off this medicine.  Can try Benadryl or Claritin for skin issue - try decreasing Nicoderm dose or changing brand.

## 2017-12-25 NOTE — Progress Notes (Signed)
HPI: Tina Navarro is a 57 y.o. female who  has a past medical history of Emphysematous COPD (HCC) (04/23/2017), GERD (gastroesophageal reflux disease), Hyperlipidemia, Peripheral arterial disease (HCC), and Thyroid nodule (04/23/2017).  she presents to [facility] today, 12/25/17,  for chief complaint of:  Shingles follow-up - Gabapentin Rash  We were prescribing Gabapentin for postherpetic neuralgia but she has noted significant improvement in mood and anxiety on this medicine and would like to continue it for that purpose.   Skin irritation from nicotine patches, pronounced red square patches on abdomen, oldest one is fading, newest one quite red. Reports minimal itching. Is helping with smoking cessation so she'd like to stay on patches, would like to avoid Chantix d/t hx intolerance with this medicine.      Past medical history, surgical history, and family history reviewed.  Current medication list and allergy/intolerance information reviewed.   (See remainder of HPI, ROS, Phys Exam below)     ASSESSMENT/PLAN:  Anxiety state - ok to conitnue gabapentin for off-label use given intolerance to usual therapy (SSRI) for anxiety/depression  Irritant contact dermatitis due to drug in contact with skin - advised if quitting smoking is worth the skin irritation, that's up to her, but can't promise this won't cause scarring or other side effects.    Meds ordered this encounter  Medications  . gabapentin (NEURONTIN) 300 MG capsule    Sig: Take 2 capsules (600 mg total) by mouth 3 (three) times daily.    Dispense:  540 capsule    Refill:  1    Patient Instructions   OK to keep taking Gabapentin - call me if you ever want to come off this medicine.  Can try Benadryl or Claritin for skin issue - try decreasing Nicoderm dose or changing brand.    Follow-up plan: Return if symptoms worsen or fail to improve, otherwise when due for annual around  04/2018.          ############################################ ############################################ ############################################ ############################################    Outpatient Encounter Medications as of 12/25/2017  Medication Sig  . aspirin 81 MG tablet Take 1 tablet (81 mg total) by mouth daily.  . Aspirin-Salicylamide-Caffeine (BC HEADACHE POWDER PO) Take 1 packet by mouth daily as needed (for headache).   Marland Kitchen atorvastatin (LIPITOR) 40 MG tablet Take 1 tablet (40 mg total) by mouth at bedtime.  . clopidogrel (PLAVIX) 75 MG tablet Take 1 tablet (75 mg total) by mouth daily.  Marland Kitchen estradiol (ESTRACE VAGINAL) 0.1 MG/GM vaginal cream Place 1 Applicatorful vaginally 3 (three) times a week.  . gabapentin (NEURONTIN) 300 MG capsule Take 1-2 capsules (300-600 mg total) by mouth 3 (three) times daily.  Marland Kitchen losartan (COZAAR) 100 MG tablet Take 1 tablet (100 mg total) by mouth daily. Due for follow up  . omeprazole (PRILOSEC) 40 MG capsule Take 1 capsule (40 mg total) by mouth 2 (two) times daily.  . promethazine (PHENERGAN) 12.5 MG tablet Take 1 tablet (12.5 mg total) by mouth every 6 (six) hours as needed for nausea or vomiting. Reported on 03/18/2015  . senna (SENOKOT) 8.6 MG tablet Take 2 tablets (17.2 mg total) by mouth daily. As needed for constipation (Patient taking differently: Take 2 tablets by mouth daily as needed for constipation. )  . vitamin B-12 (CYANOCOBALAMIN) 1000 MCG tablet Take 1,000 mcg by mouth daily.    No facility-administered encounter medications on file as of 12/25/2017.    Allergies  Allergen Reactions  . Doxycycline Hives, Rash and  Other (See Comments)    Pain, photosensitive      Review of Systems:  Constitutional: No recent illness  HEENT: No  headache, no vision change  Cardiac: No  chest pain, No  pressure, No palpitations  Respiratory:  No  shortness of breath. No  Cough  Gastrointestinal: No  abdominal pain, no  change on bowel habits  Musculoskeletal: No new myalgia/arthralgia  Skin: +Rash  Hem/Onc: No  easy bruising/bleeding, No  abnormal lumps/bumps  Neurologic: No  weakness, No  Dizziness  Psychiatric: No  concerns with depression, No  concerns with anxiety  Exam:  BP 118/63   Pulse 81   Temp 98.1 F (36.7 C) (Oral)   Wt 168 lb 6.4 oz (76.4 kg)   LMP  (LMP Unknown)   BMI 28.02 kg/m   Constitutional: VS see above. General Appearance: alert, well-developed, well-nourished, NAD  Eyes: Normal lids and conjunctive, non-icteric sclera  Ears, Nose, Mouth, Throat: MMM, Normal external inspection ears/nares/mouth/lips/gums.  Neck: No masses, trachea midline.   Respiratory: Normal respiratory effort. no wheeze, no rhonchi, no rales  Cardiovascular: S1/S2 normal, no murmur, no rub/gallop auscultated. RRR.   Musculoskeletal: Gait normal. Symmetric and independent movement of all extremities  Neurological: Normal balance/coordination. No tremor.  Skin: warm, dry, intact. Well demarcated square red rashes x6 on abdomen c/w nicoderm patch  Psychiatric: Normal judgment/insight. Normal mood and affect. Oriented x3.   Visit summary with medication list and pertinent instructions was printed for patient to review, advised to alert [place] if any changes needed. All questions at time of visit were answered - patient instructed to contact office with any additional concerns. ER/RTC precautions were reviewed with the patient and understanding verbalized.   Follow-up plan: Return if symptoms worsen or fail to improve, otherwise when due for annual around 04/2018.  Note: Total time spent 25 minutes, greater than 50% of the visit was spent face-to-face counseling and coordinating care for the following: The primary encounter diagnosis was Anxiety state. A diagnosis of Irritant contact dermatitis due to drug in contact with skin was also pertinent to this visit.Marland Kitchen  Please note: voice recognition software  was used to produce this document, and typos may escape review. Please contact Dr. Lyn Hollingshead for any needed clarifications.

## 2018-02-10 ENCOUNTER — Other Ambulatory Visit: Payer: Self-pay | Admitting: Osteopathic Medicine

## 2018-02-10 DIAGNOSIS — K588 Other irritable bowel syndrome: Secondary | ICD-10-CM

## 2018-03-05 ENCOUNTER — Inpatient Hospital Stay: Payer: Federal, State, Local not specified - PPO | Admitting: Osteopathic Medicine

## 2018-03-18 NOTE — Progress Notes (Signed)
History of Present Illness:  Patient is a 58 y.o. year old female who presents for evaluation of claudication s/p 07/16/2017 B common iliac artery angioplasty.  She was referred  for evaluation of bilateral leg pain, right greater than left.  The patient states that she has had percutaneous intervention in 2007.  At that time both legs were treated.    The patient denise symptoms of claudication or rest pain.  There is no history of ulcerations on the feet.  Atherosclerotic risk factors and other medical problems include history of tobacco abuse( stopped smoking 3 weeks ago), hyperlipidemia, and hypertension.  She takes Aspirin and Plavix daily.    Past Medical History:  Diagnosis Date  . Emphysematous COPD (HCC) 04/23/2017  . GERD (gastroesophageal reflux disease)   . Hyperlipidemia   . Peripheral arterial disease (HCC)   . Thyroid nodule 04/23/2017    Past Surgical History:  Procedure Laterality Date  . ABDOMINAL AORTOGRAM N/A 07/16/2017   Procedure: ABDOMINAL AORTOGRAM;  Surgeon: Nada Libman, MD;  Location: [facility];  Service: Cardiovascular;  Laterality: N/A;  . ENDOMETRIAL BIOPSY  2005  . FEMORAL ARTERY STENT Bilateral 2008  . LOWER EXTREMITY ANGIOGRAPHY Bilateral 07/16/2017   Procedure: Lower Extremity Angiography;  Surgeon: Nada Libman, MD;  Location: [facility] INVASIVE CV LAB;  Service: Cardiovascular;  Laterality: Bilateral;  . NASAL SINUS SURGERY    . PERIPHERAL VASCULAR BALLOON ANGIOPLASTY Bilateral 07/16/2017   Procedure: PERIPHERAL VASCULAR BALLOON ANGIOPLASTY;  Surgeon: Nada Libman, MD;  Location: [facility] INVASIVE CV LAB;  Service: Cardiovascular;  Laterality: Bilateral;  iliacs  . TONSILLECTOMY    . TUBAL LIGATION      ROS:   General:  No weight loss, Fever, chills  HEENT: No recent headaches, no nasal bleeding, no visual changes, no sore throat  Neurologic: No dizziness, blackouts, seizures. No recent symptoms of stroke or mini- stroke. No recent  episodes of slurred speech, or temporary blindness.  Cardiac: No recent episodes of chest pain/pressure, no shortness of breath at rest.  No shortness of breath with exertion.  Denies history of atrial fibrillation or irregular heartbeat  Vascular: No history of rest pain in feet.  No history of claudication.  No history of non-healing ulcer, No history of DVT   Pulmonary: No home oxygen, no productive cough, no hemoptysis,  No asthma or wheezing  Musculoskeletal:  [ ]  Arthritis, [ ]  Low back pain,  [ ]  Joint pain  Hematologic:No history of hypercoagulable state.  No history of easy bleeding.  No history of anemia  Gastrointestinal: No hematochezia or melena,  No gastroesophageal reflux, no trouble swallowing, positive history of constipation  Urinary: [ ]  chronic Kidney disease, [ ]  on HD - [ ]  MWF or [ ]  TTHS, [ ]  Burning with urination, [ ]  Frequent urination, [ ]  Difficulty urinating;   Skin: No rashes  Psychological: No history of anxiety,  No history of depression  Social History Social History   Tobacco Use  . Smoking status: Former Smoker    Packs/day: 0.50    Years: 35.00    Pack years: 17.50    Types: Cigarettes    Last attempt to quit: 12/18/2017    Years since quitting: 0.2  . Smokeless tobacco: Never Used  . Tobacco comment: Less than 1 pk per day  Substance Use Topics  . Alcohol use: No  . Drug use: No    Family History Family History  Problem Relation Age of  Onset  . Hypertension Mother   . Hypertension Father   . Heart disease Father   . Peripheral vascular disease Father   . Alcohol abuse Father   . Cancer Maternal Grandmother        BREAST  . Stroke Maternal Grandmother        CEBERAL ATHEROSCLEROSIS  . Cancer - Ovarian Cousin   . Fibroids Sister     Allergies  Allergies  Allergen Reactions  . Doxycycline Hives, Rash and Other (See Comments)    Pain, photosensitive     Current Outpatient Medications  Medication Sig Dispense Refill  .  aspirin 81 MG tablet Take 1 tablet (81 mg total) by mouth daily. 90 tablet 3  . Aspirin-Salicylamide-Caffeine (BC HEADACHE POWDER PO) Take 1 packet by mouth daily as needed (for headache).     Marland Kitchen. atorvastatin (LIPITOR) 40 MG tablet Take 1 tablet (40 mg total) by mouth at bedtime. 90 tablet 3  . clopidogrel (PLAVIX) 75 MG tablet Take 1 tablet (75 mg total) by mouth daily. 30 tablet 11  . estradiol (ESTRACE VAGINAL) 0.1 MG/GM vaginal cream Place 1 Applicatorful vaginally 3 (three) times a week. 42.5 g 12  . gabapentin (NEURONTIN) 300 MG capsule Take 2 capsules (600 mg total) by mouth 3 (three) times daily. 540 capsule 1  . losartan (COZAAR) 100 MG tablet Take 1 tablet (100 mg total) by mouth daily. Due for follow up 90 tablet 0  . omeprazole (PRILOSEC) 40 MG capsule Take 1 capsule (40 mg total) by mouth 2 (two) times daily. 180 capsule 3  . promethazine (PHENERGAN) 12.5 MG tablet TAKE ONE TABLET BY MOUTH EVERY 6 HOURS AS NEEDED FOR NAUSEA OR vomiting 30 tablet 2  . senna (SENOKOT) 8.6 MG tablet Take 2 tablets (17.2 mg total) by mouth daily. As needed for constipation (Patient taking differently: Take 2 tablets by mouth daily as needed for constipation. ) 90 tablet 1  . vitamin B-12 (CYANOCOBALAMIN) 1000 MCG tablet Take 1,000 mcg by mouth daily.      No current facility-administered medications for this visit.     Physical Examination  There were no vitals filed for this visit.  There is no height or weight on file to calculate BMI.  General:  Alert and oriented, no acute distress HEENT: Normal, normocephalic Neck: No bruit or JVD Pulmonary: Clear to auscultation bilaterally Cardiac: Regular Rate and Rhythm without murmur Abdomen: Soft, non-tender, non-distended, no mass, no scars Skin: No rash Extremity Pulses:  2+ radial, brachial, femoral, dorsalis pedis, posterior tibial pulses bilaterally Musculoskeletal: No deformity or edema  Neurologic: Upper and lower extremity motor 5/5 and  symmetric  DATA:  ABI Findings: +---------+------------------+-----+---------+--------+ Right    Rt Pressure (mmHg)IndexWaveform Comment  +---------+------------------+-----+---------+--------+ Brachial 139                                      +---------+------------------+-----+---------+--------+ ATA      140               1.01 triphasic         +---------+------------------+-----+---------+--------+ PTA      133               0.96 triphasic         +---------+------------------+-----+---------+--------+ Great Toe115               0.83                   +---------+------------------+-----+---------+--------+  +---------+------------------+-----+---------+-------+  Left     Lt Pressure (mmHg)IndexWaveform Comment +---------+------------------+-----+---------+-------+ Brachial 96                                      +---------+------------------+-----+---------+-------+ ATA      145               1.04 triphasic        +---------+------------------+-----+---------+-------+ PTA      160               1.15 triphasic        +---------+------------------+-----+---------+-------+ Great Toe128               0.92                  +---------+------------------+-----+---------+-------+  +-------+-----------+-----------+------------+------------+ ABI/TBIToday's ABIToday's TBIPrevious ABIPrevious TBI +-------+-----------+-----------+------------+------------+ Right  1.01       0.83       1.22        0.99         +-------+-----------+-----------+------------+------------+ Left   1.15       0.92       1.26        1.08         +-------+-----------+-----------+------------+------------+    Bilateral ABIs appear essentially unchanged compared to prior study on 12/16/2017.   Summary: Right: Resting right ankle-brachial index is within normal range. No evidence of significant right lower extremity arterial  disease. The right toe-brachial index is normal. RT great toe pressure = 115 mmHg.  Left: Resting left ankle-brachial index is within normal range. No evidence of significant left lower extremity arterial disease. The left toe-brachial index is normal. LT Great toe pressure = 128 mmHg.     Abdominal Aorta Findings: +-------------+-------+----------+----------+---------+--------+--------+ Location     AP (cm)Trans (cm)PSV (cm/s)Waveform ThrombusComments +-------------+-------+----------+----------+---------+--------+--------+ Proximal     1.43   1.53      77                                  +-------------+-------+----------+----------+---------+--------+--------+ Mid          2.15   2.31      47        triphasic                 +-------------+-------+----------+----------+---------+--------+--------+ Distal       2.29   1.78      72        triphasic                 +-------------+-------+----------+----------+---------+--------+--------+ RT EIA Prox                   119       triphasic                 +-------------+-------+----------+----------+---------+--------+--------+ RT EIA Distal                 144       biphasic                  +-------------+-------+----------+----------+---------+--------+--------+ LT EIA Prox                   160       biphasic                  +-------------+-------+----------+----------+---------+--------+--------+ LT EIA  Mid                    126       biphasic                  +-------------+-------+----------+----------+---------+--------+--------+ LT EIA Distal                 119       biphasic                  +-------------+-------+----------+----------+---------+--------+--------+    Right Stent(s): +-------------------+--------+--------+---------+--------+ common iliac arteryPSV cm/sStenosisWaveform  Comments +-------------------+--------+--------+---------+--------+ Prox to Stent      135             triphasic         +-------------------+--------+--------+---------+--------+ Proximal Stent     213             biphasic          +-------------------+--------+--------+---------+--------+ Mid Stent          254             triphasic         +-------------------+--------+--------+---------+--------+ Distal Stent       170             biphasic          +-------------------+--------+--------+---------+--------+ Distal to Stent    174             biphasic          +-------------------+--------+--------+---------+--------+     Left Stent(s): +-------------------+--------+--------+---------+--------+ common iliac arteryPSV cm/sStenosisWaveform Comments +-------------------+--------+--------+---------+--------+ Prox to Stent      200             triphasic         +-------------------+--------+--------+---------+--------+ Proximal Stent     124             biphasic          +-------------------+--------+--------+---------+--------+ Mid Stent          124             triphasic         +-------------------+--------+--------+---------+--------+ Distal Stent       193             triphasic         +-------------------+--------+--------+---------+--------+ Distal to Stent    208             triphasic         +-------------------+--------+--------+---------+--------+     Summary: Abdominal Aorta: No evidence of an abdominal aortic aneurysm was visualized. The largest aortic measurement is 2.3 cm. Stenosis: +------------------+-----------+ Location          Stent       +------------------+-----------+ Right Common Iliacno stenosis +------------------+-----------+ Left Common Iliac no stenosis +------------------+-----------+   Intraoperative: 07/16/2017 Findings:              Aortogram: No significant  renal artery stenosis.  The infrarenal abdominal aorta is mildly ectatic and irregular.  Stenosis greater than 70% is noted in bilateral iliac stents.  Bilateral common and external iliac arteries are widely patent.             Right Lower Extremity: The right common femoral artery is patent throughout its course.  No dominant profunda is identified.  The superficial femoral artery is widely patent as is the popliteal artery with three-vessel runoff             Left Lower  Extremity: The common femoral profundofemoral and superficial femoral artery are widely patent.  There is three-vessel runoff.  Impression:             #1  Bilateral common iliac artery in-stent stenosis successfully treated using a 7 mm balloon with residual stenosis of less than 10%             #2  No significant outflow or runoff disease bilaterally             #3  No dominant profundofemoral artery visualized on the right  ASSESSMENT:  Bilateral common iliac angioplasty 07/16/2017 with history of bilateral iliac stents.   PLAN:  Her studies today are consistent with patent blood flow in B common iliacs.  She has triphasic flow with palpable pedal pulses.  We discussed starting a walking program and continued smoking cessation.  She will f/u in 6 months for repeat duplex and ABI's.  We reviewed the symptoms of PAD and claudication.  If she has concerns she will call sooner.  She will continue daily Aspirin and Plavix.   Mosetta Pigeon PA-C [facility] Office: [phone]  MD in clinic: [facility]

## 2018-03-20 ENCOUNTER — Other Ambulatory Visit: Payer: Self-pay

## 2018-03-20 ENCOUNTER — Ambulatory Visit (INDEPENDENT_AMBULATORY_CARE_PROVIDER_SITE_OTHER)
Admission: RE | Admit: 2018-03-20 | Discharge: 2018-03-20 | Disposition: A | Discharge status: Home / Self Care | Payer: Federal, State, Local not specified - PPO | Source: Ambulatory Visit | Attending: Family | Admitting: Family

## 2018-03-20 ENCOUNTER — Ambulatory Visit (INDEPENDENT_AMBULATORY_CARE_PROVIDER_SITE_OTHER): Payer: Federal, State, Local not specified - PPO | Admitting: Physician Assistant

## 2018-03-20 ENCOUNTER — Ambulatory Visit (HOSPITAL_COMMUNITY)
Admission: RE | Admit: 2018-03-20 | Discharge: 2018-03-20 | Disposition: A | Discharge status: Home / Self Care | Payer: Federal, State, Local not specified - PPO | Source: Ambulatory Visit | Attending: Family | Admitting: Family

## 2018-03-20 VITALS — BP 137/74 | HR 80 | Temp 97.0°F | Resp 18 | Ht 65.0 in | Wt 175.0 lb

## 2018-03-20 DIAGNOSIS — I70213 Atherosclerosis of native arteries of extremities with intermittent claudication, bilateral legs: Secondary | ICD-10-CM | POA: Insufficient documentation

## 2018-03-20 DIAGNOSIS — I779 Disorder of arteries and arterioles, unspecified: Secondary | ICD-10-CM | POA: Insufficient documentation

## 2018-03-20 DIAGNOSIS — I739 Peripheral vascular disease, unspecified: Secondary | ICD-10-CM

## 2018-03-26 ENCOUNTER — Other Ambulatory Visit: Payer: Self-pay | Admitting: Osteopathic Medicine

## 2018-04-22 DIAGNOSIS — K080 Exfoliation of teeth due to systemic causes: Secondary | ICD-10-CM | POA: Diagnosis not present

## 2018-05-01 ENCOUNTER — Other Ambulatory Visit: Payer: Self-pay | Admitting: Osteopathic Medicine

## 2018-05-01 DIAGNOSIS — K219 Gastro-esophageal reflux disease without esophagitis: Secondary | ICD-10-CM

## 2018-05-01 NOTE — Telephone Encounter (Signed)
[facility] does not fill for this provider. 

## 2018-07-03 ENCOUNTER — Other Ambulatory Visit: Payer: Self-pay | Admitting: Osteopathic Medicine

## 2018-07-16 ENCOUNTER — Encounter: Payer: Self-pay | Admitting: Osteopathic Medicine

## 2018-07-16 ENCOUNTER — Ambulatory Visit (INDEPENDENT_AMBULATORY_CARE_PROVIDER_SITE_OTHER): Payer: Federal, State, Local not specified - PPO | Admitting: Osteopathic Medicine

## 2018-07-16 VITALS — Wt 177.0 lb

## 2018-07-16 DIAGNOSIS — I10 Essential (primary) hypertension: Secondary | ICD-10-CM | POA: Diagnosis not present

## 2018-07-16 DIAGNOSIS — N952 Postmenopausal atrophic vaginitis: Secondary | ICD-10-CM

## 2018-07-16 DIAGNOSIS — K219 Gastro-esophageal reflux disease without esophagitis: Secondary | ICD-10-CM

## 2018-07-16 DIAGNOSIS — E041 Nontoxic single thyroid nodule: Secondary | ICD-10-CM

## 2018-07-16 DIAGNOSIS — I739 Peripheral vascular disease, unspecified: Secondary | ICD-10-CM

## 2018-07-16 DIAGNOSIS — E785 Hyperlipidemia, unspecified: Secondary | ICD-10-CM

## 2018-07-16 MED ORDER — ATORVASTATIN CALCIUM 40 MG PO TABS: 40.0000 mg | ORAL_TABLET | Freq: Every day | ORAL | 3 refills | Status: DC

## 2018-07-16 MED ORDER — CLOPIDOGREL BISULFATE 75 MG PO TABS: 75.0000 mg | ORAL_TABLET | Freq: Every day | ORAL | 3 refills | Status: DC

## 2018-07-16 MED ORDER — OMEPRAZOLE 40 MG PO CPDR: 40.0000 mg | DELAYED_RELEASE_CAPSULE | Freq: Two times a day (BID) | ORAL | 3 refills | Status: DC

## 2018-07-16 MED ORDER — OMEPRAZOLE 40 MG PO CPDR: 40.0000 mg | DELAYED_RELEASE_CAPSULE | Freq: Every day | ORAL | 3 refills | Status: DC

## 2018-07-16 MED ORDER — CLOPIDOGREL BISULFATE 75 MG PO TABS: 75.0000 mg | ORAL_TABLET | Freq: Every day | ORAL | 11 refills | Status: DC

## 2018-07-16 MED ORDER — ASPIRIN 81 MG PO TABS: 81.0000 mg | ORAL_TABLET | Freq: Every day | ORAL | 3 refills | Status: DC

## 2018-07-16 MED ORDER — GABAPENTIN 300 MG PO CAPS: 600.0000 mg | ORAL_CAPSULE | Freq: Three times a day (TID) | ORAL | 1 refills | Status: DC

## 2018-07-16 MED ORDER — LOSARTAN POTASSIUM 100 MG PO TABS: 100.0000 mg | ORAL_TABLET | Freq: Every day | ORAL | 3 refills | Status: DC

## 2018-07-16 MED ORDER — ESTRADIOL 0.1 MG/GM VA CREA: 1.0000 | TOPICAL_CREAM | VAGINAL | 12 refills | Status: DC

## 2018-07-16 NOTE — Progress Notes (Signed)
Virtual Visit via Video (App used: Doximity) Note  I connected with      Tina Navarro on 07/16/18 at 1:00 PM by a telemedicine application and verified that I am speaking with the correct person using two identifiers.  Patient is at home I am working from home    I discussed the limitations of evaluation and management by telemedicine and the availability of in person appointments. The patient expressed understanding and agreed to proceed.  History of Present Illness: Tina Navarro is a 58 y.o. female who would like to discuss  Chief Complaint  Patient presents with  . Medication Refill    Needs refills on all meds. Was due for annual in 04/2018, pandemic has forced reschedule a few times. She's otherwise well, staying home.     Observations/Objective: Wt 177 lb (80.3 kg)   LMP  (LMP Unknown)   BMI 29.45 kg/m  BP Readings from Last 3 Encounters:  03/20/18 137/74  12/25/17 118/63  12/16/17 122/82   Exam: Normal Speech.  NAD  Lab and Radiology Results No results found for this or any previous visit (from the past 72 hour(s)). No results found.     Assessment and Plan: 58 y.o. female with The primary encounter diagnosis was Essential hypertension. Diagnoses of Peripheral vascular disease (HCC), Postmenopausal atrophic vaginitis, Gastroesophageal reflux disease, esophagitis presence not specified, Thyroid nodule, and Dyslipidemia were also pertinent to this visit.   PDMP not reviewed this encounter. Orders Placed This Encounter  Procedures  . CBC  . COMPLETE METABOLIC PANEL WITH GFR  . Lipid panel  . TSH  . Hemoglobin A1c   Meds ordered this encounter  Medications  . DISCONTD: clopidogrel (PLAVIX) 75 MG tablet    Sig: Take 1 tablet (75 mg total) by mouth daily.    Dispense:  30 tablet    Refill:  11  . aspirin 81 MG tablet    Sig: Take 1 tablet (81 mg total) by mouth daily.    Dispense:  90 tablet    Refill:  3  . atorvastatin (LIPITOR) 40 MG  tablet    Sig: Take 1 tablet (40 mg total) by mouth at bedtime.    Dispense:  90 tablet    Refill:  3  . estradiol (ESTRACE VAGINAL) 0.1 MG/GM vaginal cream    Sig: Place 1 Applicatorful vaginally 3 (three) times a week.    Dispense:  42.5 g    Refill:  12  . gabapentin (NEURONTIN) 300 MG capsule    Sig: Take 2 capsules (600 mg total) by mouth 3 (three) times daily.    Dispense:  540 capsule    Refill:  1  . losartan (COZAAR) 100 MG tablet    Sig: Take 1 tablet (100 mg total) by mouth daily.    Dispense:  90 tablet    Refill:  3  . DISCONTD: omeprazole (PRILOSEC) 40 MG capsule    Sig: Take 1 capsule (40 mg total) by mouth daily.    Dispense:  90 capsule    Refill:  3    This prescription was filled on 02/10/2018. Any refills authorized will be placed on file.  . clopidogrel (PLAVIX) 75 MG tablet    Sig: Take 1 tablet (75 mg total) by mouth daily.    Dispense:  90 tablet    Refill:  3  . omeprazole (PRILOSEC) 40 MG capsule    Sig: Take 1 capsule (40 mg total) by mouth 2 (two) times a day.  Dispense:  180 capsule    Refill:  3    Cancel daily dose #90 thanks!    Instructions sent via MyChart. If MyChart not available, pt was given option for info via personal e-mail w/ no guarantee of protected health info over unsecured e-mail communication, and MyChart sign-up instructions were included.   Follow Up Instructions: Return in about 6 months (around 01/16/2019) for sometime in next 6 mos for annual exam .    I discussed the assessment and treatment plan with the patient. The patient was provided an opportunity to ask questions and all were answered. The patient agreed with the plan and demonstrated an understanding of the instructions.   The patient was advised to call back or seek an in-person evaluation if any new concerns, if symptoms worsen or if the condition fails to improve as anticipated.  15 minutes of non-face-to-face time was provided during this  encounter.                      Historical information moved to improve visibility of documentation.  Past Medical History:  Diagnosis Date  . Emphysematous COPD (HCC) 04/23/2017  . GERD (gastroesophageal reflux disease)   . Hyperlipidemia   . Peripheral arterial disease (HCC)   . Thyroid nodule 04/23/2017   Past Surgical History:  Procedure Laterality Date  . ABDOMINAL AORTOGRAM N/A 07/16/2017   Procedure: ABDOMINAL AORTOGRAM;  Surgeon: Nada Libman, MD;  Location: [facility];  Service: Cardiovascular;  Laterality: N/A;  . ENDOMETRIAL BIOPSY  2005  . FEMORAL ARTERY STENT Bilateral 2008  . LOWER EXTREMITY ANGIOGRAPHY Bilateral 07/16/2017   Procedure: Lower Extremity Angiography;  Surgeon: Nada Libman, MD;  Location: [facility] INVASIVE CV LAB;  Service: Cardiovascular;  Laterality: Bilateral;  . NASAL SINUS SURGERY    . PERIPHERAL VASCULAR BALLOON ANGIOPLASTY Bilateral 07/16/2017   Procedure: PERIPHERAL VASCULAR BALLOON ANGIOPLASTY;  Surgeon: Nada Libman, MD;  Location: [facility]VAS[facility];  Service: Cardiovascular;  Laterality: Bilateral;  iliacs  . TONSILLECTOMY    . TUBAL LIGATION     Social History   Tobacco Use  . Smoking status: Current Every Day Smoker    Packs/day: 0.50    Years: 35.00    Pack years: 17.50    Types: Cigarettes    Last attempt to quit: 12/18/2017    Years since quitting: 0.5  . Smokeless tobacco: Never Used  . Tobacco comment: As per pt - smoking 1/2 pk daily  Substance Use Topics  . Alcohol use: No   family history includes Alcohol abuse in her father; Cancer in her maternal grandmother; Cancer - Ovarian in her cousin; Fibroids in her sister; Heart disease in her father; Hypertension in her father and mother; Peripheral vascular disease in her father; Stroke in her maternal grandmother.  Medications: Current Outpatient Medications  Medication Sig Dispense Refill  . aspirin 81 MG tablet Take 1 tablet (81 mg total) by  mouth daily. 90 tablet 3  . Aspirin-Salicylamide-Caffeine (BC HEADACHE POWDER PO) Take 1 packet by mouth daily as needed (for headache).     Marland Kitchen atorvastatin (LIPITOR) 40 MG tablet Take 1 tablet (40 mg total) by mouth at bedtime. 90 tablet 3  . clopidogrel (PLAVIX) 75 MG tablet Take 1 tablet (75 mg total) by mouth daily. 90 tablet 3  . estradiol (ESTRACE VAGINAL) 0.1 MG/GM vaginal cream Place 1 Applicatorful vaginally 3 (three) times a week. 42.5 g 12  . gabapentin (NEURONTIN) 300 MG capsule  Take 2 capsules (600 mg total) by mouth 3 (three) times daily. 540 capsule 1  . losartan (COZAAR) 100 MG tablet Take 1 tablet (100 mg total) by mouth daily. 90 tablet 3  . omeprazole (PRILOSEC) 40 MG capsule Take 1 capsule (40 mg total) by mouth 2 (two) times a day. 180 capsule 3  . promethazine (PHENERGAN) 12.5 MG tablet TAKE ONE TABLET BY MOUTH EVERY 6 HOURS AS NEEDED FOR NAUSEA OR vomiting 30 tablet 2  . senna (SENOKOT) 8.6 MG tablet Take 2 tablets (17.2 mg total) by mouth daily. As needed for constipation (Patient taking differently: Take 2 tablets by mouth daily as needed for constipation. ) 90 tablet 1  . vitamin B-12 (CYANOCOBALAMIN) 1000 MCG tablet Take 1,000 mcg by mouth daily.      No current facility-administered medications for this visit.    Allergies  Allergen Reactions  . Doxycycline Hives, Rash and Other (See Comments)    Pain, photosensitive    PDMP not reviewed this encounter. Orders Placed This Encounter  Procedures  . CBC  . COMPLETE METABOLIC PANEL WITH GFR  . Lipid panel  . TSH  . Hemoglobin A1c   Meds ordered this encounter  Medications  . DISCONTD: clopidogrel (PLAVIX) 75 MG tablet    Sig: Take 1 tablet (75 mg total) by mouth daily.    Dispense:  30 tablet    Refill:  11  . aspirin 81 MG tablet    Sig: Take 1 tablet (81 mg total) by mouth daily.    Dispense:  90 tablet    Refill:  3  . atorvastatin (LIPITOR) 40 MG tablet    Sig: Take 1 tablet (40 mg total) by mouth at  bedtime.    Dispense:  90 tablet    Refill:  3  . estradiol (ESTRACE VAGINAL) 0.1 MG/GM vaginal cream    Sig: Place 1 Applicatorful vaginally 3 (three) times a week.    Dispense:  42.5 g    Refill:  12  . gabapentin (NEURONTIN) 300 MG capsule    Sig: Take 2 capsules (600 mg total) by mouth 3 (three) times daily.    Dispense:  540 capsule    Refill:  1  . losartan (COZAAR) 100 MG tablet    Sig: Take 1 tablet (100 mg total) by mouth daily.    Dispense:  90 tablet    Refill:  3  . DISCONTD: omeprazole (PRILOSEC) 40 MG capsule    Sig: Take 1 capsule (40 mg total) by mouth daily.    Dispense:  90 capsule    Refill:  3    This prescription was filled on 02/10/2018. Any refills authorized will be placed on file.  . clopidogrel (PLAVIX) 75 MG tablet    Sig: Take 1 tablet (75 mg total) by mouth daily.    Dispense:  90 tablet    Refill:  3  . omeprazole (PRILOSEC) 40 MG capsule    Sig: Take 1 capsule (40 mg total) by mouth 2 (two) times a day.    Dispense:  180 capsule    Refill:  3    Cancel daily dose #90 thanks!

## 2018-07-28 DIAGNOSIS — Z1239 Encounter for other screening for malignant neoplasm of breast: Secondary | ICD-10-CM | POA: Diagnosis not present

## 2018-07-28 DIAGNOSIS — Z124 Encounter for screening for malignant neoplasm of cervix: Secondary | ICD-10-CM | POA: Diagnosis not present

## 2018-07-28 DIAGNOSIS — Z01419 Encounter for gynecological examination (general) (routine) without abnormal findings: Secondary | ICD-10-CM | POA: Diagnosis not present

## 2018-07-28 DIAGNOSIS — Z1151 Encounter for screening for human papillomavirus (HPV): Secondary | ICD-10-CM | POA: Diagnosis not present

## 2018-07-28 DIAGNOSIS — Z1231 Encounter for screening mammogram for malignant neoplasm of breast: Secondary | ICD-10-CM | POA: Diagnosis not present

## 2018-09-04 LAB — COMPLETE METABOLIC PANEL WITH GFR
AG Ratio: 1.8 (calc) (ref 1.0–2.5)
ALT: 15 U/L (ref 6–29)
AST: 16 U/L (ref 10–35)
Albumin: 4 g/dL (ref 3.6–5.1)
Alkaline phosphatase (APISO): 106 U/L (ref 37–153)
BUN: 12 mg/dL (ref 7–25)
CO2: 22 mmol/L (ref 20–32)
Calcium: 9.2 mg/dL (ref 8.6–10.4)
Chloride: 106 mmol/L (ref 98–110)
Creat: 0.83 mg/dL (ref 0.50–1.05)
GFR, Est African American: 91 mL/min/{1.73_m2} (ref >=60)
GFR, Est Non African American: 78 mL/min/{1.73_m2} (ref >=60)
Globulin: 2.2 g/dL (calc) (ref 1.9–3.7)
Glucose, Bld: 91 mg/dL (ref 65–139)
Potassium: 4 mmol/L (ref 3.5–5.3)
Sodium: 139 mmol/L (ref 135–146)
Total Bilirubin: 0.3 mg/dL (ref 0.2–1.2)
Total Protein: 6.2 g/dL (ref 6.1–8.1)

## 2018-09-04 LAB — CBC
HCT: 41.7 % (ref 35.0–45.0)
Hemoglobin: 13.9 g/dL (ref 11.7–15.5)
MCH: 29.1 pg (ref 27.0–33.0)
MCHC: 33.3 g/dL (ref 32.0–36.0)
MCV: 87.2 fL (ref 80.0–100.0)
MPV: 9.3 fL (ref 7.5–12.5)
Platelets: 304 10*3/uL (ref 140–400)
RBC: 4.78 10*6/uL (ref 3.80–5.10)
RDW: 13.3 % (ref 11.0–15.0)
WBC: 5.7 10*3/uL (ref 3.8–10.8)

## 2018-09-04 LAB — TSH: TSH: 1.97 mIU/L (ref 0.40–4.50)

## 2018-09-04 LAB — HEMOGLOBIN A1C
Hgb A1c MFr Bld: 5.3 % of total Hgb (ref <5.7)
Mean Plasma Glucose: 105 (calc)
eAG (mmol/L): 5.8 (calc)

## 2018-09-04 LAB — LIPID PANEL
Cholesterol: 188 mg/dL (ref <200)
HDL: 32 mg/dL — ABNORMAL LOW (ref >=50)
LDL Cholesterol (Calc): 106 mg/dL (calc) — ABNORMAL HIGH
Non-HDL Cholesterol (Calc): 156 mg/dL (calc) — ABNORMAL HIGH (ref <130)
Total CHOL/HDL Ratio: 5.9 (calc) — ABNORMAL HIGH (ref <5.0)
Triglycerides: 373 mg/dL — ABNORMAL HIGH (ref <150)

## 2018-10-06 ENCOUNTER — Other Ambulatory Visit: Payer: Self-pay

## 2018-10-06 DIAGNOSIS — I779 Disorder of arteries and arterioles, unspecified: Secondary | ICD-10-CM

## 2018-10-09 ENCOUNTER — Other Ambulatory Visit: Payer: Self-pay

## 2018-10-09 ENCOUNTER — Ambulatory Visit (INDEPENDENT_AMBULATORY_CARE_PROVIDER_SITE_OTHER)
Admission: RE | Admit: 2018-10-09 | Discharge: 2018-10-09 | Disposition: A | Discharge status: Home / Self Care | Payer: Federal, State, Local not specified - PPO | Source: Ambulatory Visit | Attending: Family | Admitting: Family

## 2018-10-09 ENCOUNTER — Ambulatory Visit (HOSPITAL_COMMUNITY)
Admission: RE | Admit: 2018-10-09 | Discharge: 2018-10-09 | Disposition: A | Discharge status: Home / Self Care | Payer: Federal, State, Local not specified - PPO | Source: Ambulatory Visit | Attending: Family | Admitting: Family

## 2018-10-09 ENCOUNTER — Encounter: Payer: Self-pay | Admitting: Family

## 2018-10-09 ENCOUNTER — Ambulatory Visit (INDEPENDENT_AMBULATORY_CARE_PROVIDER_SITE_OTHER): Payer: Federal, State, Local not specified - PPO | Admitting: Family

## 2018-10-09 VITALS — BP 125/74 | HR 78 | Temp 97.9°F | Resp 12 | Ht 65.0 in | Wt 176.7 lb

## 2018-10-09 DIAGNOSIS — I771 Stricture of artery: Secondary | ICD-10-CM | POA: Diagnosis not present

## 2018-10-09 DIAGNOSIS — I6523 Occlusion and stenosis of bilateral carotid arteries: Secondary | ICD-10-CM | POA: Diagnosis not present

## 2018-10-09 DIAGNOSIS — I779 Disorder of arteries and arterioles, unspecified: Secondary | ICD-10-CM | POA: Diagnosis not present

## 2018-10-09 DIAGNOSIS — F17200 Nicotine dependence, unspecified, uncomplicated: Secondary | ICD-10-CM

## 2018-10-09 NOTE — Progress Notes (Signed)
[facility] [place]   CC: Follow up peripheral artery occlusive disease  History of Present Illness Tina Navarro is a 58 y.o. female who is s/p angioplasty of bilateralcommon iliac arteries on 07-16-17 by Dr. Myra GianottiBrabham for bilateral legs claudication. She also has a history of an intervention in 2007 for claudicationata different facility location. Shewashaving recurrent symptoms, similar to the one she had before the stents were placed.    She comes in today for follow up.   A few months ago her right buttock started hurting after walking varying distances, the pain radiates down the posterior aspect of her right thigh, stops at her posterior knee.   She is seeing a chiropractor for neck and low back issues.   Pt states from another facility it is known that she has partial blockage of her left subclavian artery, and has weakness in her left hand when washing her hair, or when raking, and has to give her left arm a rest for a while. .   She denies any history of stroke or TIA.   Diabetic: No Tobacco use: smoker  (1/2 ppd, started in her teens)  Pt meds include: Statin :Yes Betablocker: No ASA: Yes Other anticoagulants/antiplatelets: Plavix   Past Medical History:  Diagnosis Date  . Emphysematous COPD (HCC) 04/23/2017  . GERD (gastroesophageal reflux disease)   . Hyperlipidemia   . Peripheral arterial disease (HCC)   . Thyroid nodule 04/23/2017    Social History Social History   Tobacco Use  . Smoking status: Current Every Day Smoker    Packs/day: 0.50    Years: 35.00    Pack years: 17.50    Types: Cigarettes    Last attempt to quit: 12/18/2017    Years since quitting: 0.8  . Smokeless tobacco: Never Used  . Tobacco comment: As per pt - smoking 1/2 pk daily  Substance Use Topics  . Alcohol use: No  . Drug use: No    Family History Family History  Problem Relation Age of Onset  . Hypertension Mother   . Hypertension Father    . Heart disease Father   . Peripheral vascular disease Father   . Alcohol abuse Father   . Cancer Maternal Grandmother        BREAST  . Stroke Maternal Grandmother        CEBERAL ATHEROSCLEROSIS  . Cancer - Ovarian Cousin   . Fibroids Sister     Past Surgical History:  Procedure Laterality Date  . ABDOMINAL AORTOGRAM N/A 07/16/2017   Procedure: ABDOMINAL AORTOGRAM;  Surgeon: Nada LibmanBrabham, Vance W, MD;  Location: [facility];  Service: Cardiovascular;  Laterality: N/A;  . ENDOMETRIAL BIOPSY  2005  . FEMORAL ARTERY STENT Bilateral 2008  . LOWER EXTREMITY ANGIOGRAPHY Bilateral 07/16/2017   Procedure: Lower Extremity Angiography;  Surgeon: Nada LibmanBrabham, Vance W, MD;  [facility];  Service: Cardiovascular;  Laterality: Bilateral;  . NASAL SINUS SURGERY    . PERIPHERAL VASCULAR BALLOON ANGIOPLASTY Bilateral 07/16/2017   Procedure: PERIPHERAL VASCULAR BALLOON ANGIOPLASTY;  Surgeon: Nada LibmanBrabham, Vance W, MD;  Location: [facility];  Service: Cardiovascular;  Laterality: Bilateral;  iliacs  . TONSILLECTOMY    . TUBAL LIGATION      Allergies  Allergen Reactions  . Doxycycline Hives, Rash and Other (See Comments)    Pain, photosensitive    Current Outpatient Medications  Medication Sig Dispense Refill  . aspirin 81 MG tablet Take 1 tablet (81 mg total) by mouth daily.  90 tablet 3  . Aspirin-Salicylamide-Caffeine (BC HEADACHE POWDER PO) Take 1 packet by mouth daily as needed (for headache).     Marland Kitchen atorvastatin (LIPITOR) 40 MG tablet Take 1 tablet (40 mg total) by mouth at bedtime. 90 tablet 3  . clopidogrel (PLAVIX) 75 MG tablet Take 1 tablet (75 mg total) by mouth daily. 90 tablet 3  . estradiol (ESTRACE VAGINAL) 0.1 MG/GM vaginal cream Place 1 Applicatorful vaginally 3 (three) times a week. 42.5 g 12  . gabapentin (NEURONTIN) 300 MG capsule Take 2 capsules (600 mg total) by mouth 3 (three) times daily. 540 capsule 1  . losartan (COZAAR) 100 MG tablet Take 1 tablet (100 mg  total) by mouth daily. 90 tablet 3  . omeprazole (PRILOSEC) 40 MG capsule Take 1 capsule (40 mg total) by mouth 2 (two) times a day. 180 capsule 3  . promethazine (PHENERGAN) 12.5 MG tablet TAKE ONE TABLET BY MOUTH EVERY 6 HOURS AS NEEDED FOR NAUSEA OR vomiting 30 tablet 2  . senna (SENOKOT) 8.6 MG tablet Take 2 tablets (17.2 mg total) by mouth daily. As needed for constipation (Patient taking differently: Take 2 tablets by mouth daily as needed for constipation. ) 90 tablet 1  . vitamin B-12 (CYANOCOBALAMIN) 1000 MCG tablet Take 1,000 mcg by mouth daily.      No current facility-administered medications for this visit.     ROS: See HPI for pertinent positives and negatives.   Physical Examination  Vitals:   10/09/18 0913 10/09/18 0915  BP: 128/74 125/74  Pulse: 78   Resp: 12   Temp: 97.9 F (36.6 C)   TempSrc: Temporal   SpO2: 97%   Weight: 176 lb 11.2 oz (80.2 kg)   Height: 5\' 5"  (1.651 m)    Body mass index is 29.4 kg/m.  General: A&O x 3, WDWN, female. Gait: normal HENT: No gross abnormalities.  Eyes: PERRLA. Pulmonary: Respirations are non labored, CTAB, good air movement in all fields Cardiac: regular rhythm, no detected murmur.         Carotid Bruits Right Left   Negative Negative   Radial pulses: 1+ palpable right, not palpable left   Adominal aortic pulse is not palpable                         VASCULAR EXAM: Extremities without ischemic changes, without Gangrene; without open wounds.                                                                                                          LE Pulses Right Left       FEMORAL  not palpable  1+ palpable        POPLITEAL  not palpable   not palpable       POSTERIOR TIBIAL  not palpable   not palpable        DORSALIS PEDIS      ANTERIOR TIBIAL not palpable  2+ palpable    Abdomen: soft, NT, no palpable masses. Central adiposity  Skin: no  rashes, no cellulitis, no ulcers noted. Musculoskeletal: no muscle  wasting or atrophy.  Neurologic: A&O X 3; appropriate affect, Sensation is normal; MOTOR FUNCTION:  moving all extremities equally, motor strength 5/5 throughout. Speech is fluent/normal. CN 2-12 intact. Psychiatric: Thought content is normal, mood appropriate for clinical situation.    ASSESSMENT: Tina Navarro is a 58 y.o. female who is s/p angioplasty of bilateralcommon iliac arteries on 07-16-17 by Dr. Myra GianottiBrabham for bilateral legs claudication.The patient has a history of a intervention in 2007 for claudicationata different facility location.  In the last few months she has developed right buttock claudication after walking varying distances; she also has known lumbar and cervical spine issues, sees a [facility].  Right femoral pulse is not palpable today, was 2+ palpable when I last saw her. Right CIA with a velocity of 355 cm/s, right ABI declined to 0.88 from 100%. Aortoiliac duplex today mentions stents, op note did not mention stents.   Pt states from another facility it is known that she has partial blockage of her left subclavian artery, and has weakness in her left hand when washing her hair, or when raking, and has to give her left arm a rest for a while.  There is a 50 mm Hg gradient difference in her systolic pressures, right arm is the correct blood pressure, left is falsely low due to left subclavian stenosis.    She denies any history of stroke or TIA. Moderate bilateral ICA stenosis on carotid duplex October 2019. Follow up carotid duplex in a year.   She has no bleeding issues taking 81 mg ASA daily and Plavix; pt asked when she can come off Plavix; I advised dual antiplatelet therapy until she quits smokomg,since tobacco use is a potent atherosclerotic risk factor.    DATA  Abdominal Aorta Findings (10-09-18): +-------------+-------+----------+----------+--------+--------+--------+ Location     AP (cm)Trans (cm)PSV  (cm/s)WaveformThrombusComments +-------------+-------+----------+----------+--------+--------+--------+ Mid          1.48   1.87      53                                 +-------------+-------+----------+----------+--------+--------+--------+ Distal                        46                                 +-------------+-------+----------+----------+--------+--------+--------+ RT EIA Distal                 113       biphasic                 +-------------+-------+----------+----------+--------+--------+--------+ LT EIA Distal                 125       biphasic                 +-------------+-------+----------+----------+--------+--------+--------+  Right Stent(s): +-------------------+--------+---------------+----------+--------+ Common iliac arteryPSV cm/sStenosis       Waveform  Comments +-------------------+--------+---------------+----------+--------+ Prox to Stent      104                    monophasic         +-------------------+--------+---------------+----------+--------+ Proximal Stent     355     50-99% stenosismonophasic         +-------------------+--------+---------------+----------+--------+  Mid Stent          107                    monophasic         +-------------------+--------+---------------+----------+--------+ Distal Stent       83                     monophasic         +-------------------+--------+---------------+----------+--------+ Distal to Stent    95                     monophasic         +-------------------+--------+---------------+----------+--------+   Left Stent(s): +-------------------+--------+--------+---------+--------+ Common iliac arteryPSV cm/sStenosisWaveform Comments +-------------------+--------+--------+---------+--------+ Prox to Stent      145             biphasic          +-------------------+--------+--------+---------+--------+ Proximal Stent     148              biphasic          +-------------------+--------+--------+---------+--------+ Mid Stent          149             triphasic         +-------------------+--------+--------+---------+--------+ Distal Stent       208             triphasic         +-------------------+--------+--------+---------+--------+ Distal to Stent    149             triphasic         +-------------------+--------+--------+---------+--------+  Summary: Stenosis: +------------------+---------------+ Location          Stent           +------------------+---------------+ Right Common Iliac50-99% stenosis +------------------+---------------+ Left Common Iliac no stenosis     +------------------+---------------+   ABI Findings (10-09-18): +---------+------------------+-----+----------+--------+ Right    Rt Pressure (mmHg)IndexWaveform  Comment  +---------+------------------+-----+----------+--------+ Brachial 134                                       +---------+------------------+-----+----------+--------+ ATA      108               0.81 monophasic         +---------+------------------+-----+----------+--------+ PTA      118               0.88 triphasic          +---------+------------------+-----+----------+--------+ Great Toe86                0.64                    +---------+------------------+-----+----------+--------+  +---------+------------------+-----+---------+-------+ Left     Lt Pressure (mmHg)IndexWaveform Comment +---------+------------------+-----+---------+-------+ Brachial 64                                      +---------+------------------+-----+---------+-------+ ATA      113               0.84 triphasic        +---------+------------------+-----+---------+-------+ PTA      134               1.00 triphasic        +---------+------------------+-----+---------+-------+  Great Toe111                0.83                  +---------+------------------+-----+---------+-------+  +-------+-----------+-----------+------------+------------+ ABI/TBIToday's ABIToday's TBIPrevious ABIPrevious TBI +-------+-----------+-----------+------------+------------+ Right  0.88       0.64       1.01        0.83         +-------+-----------+-----------+------------+------------+ Left   1.00       0.83       1.15        0.92         +-------+-----------+-----------+------------+------------+  Right ABIs appear decreased compared to prior study on 03/20/2018. Left ABIs appear essentially unchanged compared to prior study on 03/20/2018.   Summary: Right: Resting right ankle-brachial index indicates mild right lower extremity arterial disease. The right toe-brachial index is abnormal. RT great toe pressure = 86 mmHg.  Left: Resting left ankle-brachial index is within normal range. No evidence of significant left lower extremity arterial disease. The left toe-brachial index is abnormal. LT Great toe pressure = 111 mmHg.   Carotid Duplex (12-16-17): 40-59% bilateral ICA stenosis Right vertebral artery flow is antegrade, left is antegrade dampened.  Right Brachial index 109 cm/s triphasic waveform. Left Brachial index monophasic waveform 55.1 c,/s. Known subclavian artery occlusive disease.  No previous exam for comparison.     PLAN:  Based on the patient's vascular studies and examination, and after discussing with Dr. Darrick PennaFields, pt will return to clinic in 3 weeks to discuss with Dr. Myra GianottiBrabham whether or not an arteriogram intervention would be warranted.  I advised her to notify us if her symptoms worsen.  Walk at least 30 minutes total daily in a safe environment.   Over 3 minutes was spent counseling patient re smoking cessation, and patient was given several free resources re smoking cessation.  I discussed in depth with the patient the nature of atherosclerosis, and  emphasized the importance of maximal medical management including strict control of blood pressure, blood glucose, and lipid levels, obtaining regular exercise, and cessation of smoking.  The patient is aware that without maximal medical management the underlying atherosclerotic disease process will progress, limiting the benefit of any interventions.  The patient was given information about PAD including signs, symptoms, treatment, what symptoms should prompt the patient to seek immediate medical care, and risk reduction measures to take.  Charisse MarchSuzanne Ettel Albergo, RN, MSN, FNP-C [facility] [place] Office Phone: [phone]  [facility]: [facility]  10/09/18 9:37 AM

## 2018-10-09 NOTE — Patient Instructions (Signed)
Steps to Quit Smoking Smoking tobacco is the leading cause of preventable death. It can affect almost every organ in the body. Smoking puts you and people around you at risk for many serious, long-lasting (chronic) diseases. Quitting smoking can be hard, but it is one of the best things that you can do for your health. It is never too late to quit. How do I get ready to quit? When you decide to quit smoking, make a plan to help you succeed. Before you quit:  Pick a date to quit. Set a date within the next 2 weeks to give you time to prepare.  Write down the reasons why you are quitting. Keep this list in places where you will see it often.  Tell your family, friends, and co-workers that you are quitting. Their support is important.  Talk with your doctor about the choices that may help you quit.  Find out if your health insurance will pay for these treatments.  Know the people, places, things, and activities that make you want to smoke (triggers). Avoid them. What first steps can I take to quit smoking?  Throw away all cigarettes at home, at work, and in your car.  Throw away the things that you use when you smoke, such as ashtrays and lighters.  Clean your car. Make sure to empty the ashtray.  Clean your home, including curtains and carpets. What can I do to help me quit smoking? Talk with your doctor about taking medicines and seeing a counselor at the same time. You are more likely to succeed when you do both.  If you are pregnant or breastfeeding, talk with your doctor about counseling or other ways to quit smoking. Do not take medicine to help you quit smoking unless your doctor tells you to do so. To quit smoking: Quit right away  Quit smoking totally, instead of slowly cutting back on how much you smoke over a period of time.  Go to counseling. You are more likely to quit if you go to counseling sessions regularly. Take medicine You may take medicines to help you quit. Some  medicines need a prescription, and some you can buy over-the-counter. Some medicines may contain a drug called nicotine to replace the nicotine in cigarettes. Medicines may:  Help you to stop having the desire to smoke (cravings).  Help to stop the problems that come when you stop smoking (withdrawal symptoms). Your doctor may ask you to use:  Nicotine patches, gum, or lozenges.  Nicotine inhalers or sprays.  Non-nicotine medicine that is taken by mouth. Find resources Find resources and other ways to help you quit smoking and remain smoke-free after you quit. These resources are most helpful when you use them often. They include:  Online chats with a counselor.  Phone quitlines.  Printed self-help materials.  Support groups or group counseling.  Text messaging programs.  Mobile phone apps. Use apps on your mobile phone or tablet that can help you stick to your quit plan. There are many free apps for mobile phones and tablets as well as websites. Examples include Quit Guide from the CDC and smokefree.gov  What things can I do to make it easier to quit?   Talk to your family and friends. Ask them to support and encourage you.  Call a phone quitline (1-800-QUIT-NOW), reach out to support groups, or work with a counselor.  Ask people who smoke to not smoke around you.  Avoid places that make you want to smoke,   such as: ? Bars. ? Parties. ? Smoke-break areas at work.  Spend time with people who do not smoke.  Lower the stress in your life. Stress can make you want to smoke. Try these things to help your stress: ? Getting regular exercise. ? Doing deep-breathing exercises. ? Doing yoga. ? Meditating. ? Doing a body scan. To do this, close your eyes, focus on one area of your body at a time from head to toe. Notice which parts of your body are tense. Try to relax the muscles in those areas. How will I feel when I quit smoking? Day 1 to 3 weeks Within the first 24 hours,  you may start to have some problems that come from quitting tobacco. These problems are very bad 2-3 days after you quit, but they do not often last for more than 2-3 weeks. You may get these symptoms:  Mood swings.  Feeling restless, nervous, angry, or annoyed.  Trouble concentrating.  Dizziness.  Strong desire for high-sugar foods and nicotine.  Weight gain.  Trouble pooping (constipation).  Feeling like you may vomit (nausea).  Coughing or a sore throat.  Changes in how the medicines that you take for other issues work in your body.  Depression.  Trouble sleeping (insomnia). Week 3 and afterward After the first 2-3 weeks of quitting, you may start to notice more positive results, such as:  Better sense of smell and taste.  Less coughing and sore throat.  Slower heart rate.  Lower blood pressure.  Clearer skin.  Better breathing.  Fewer sick days. Quitting smoking can be hard. Do not give up if you fail the first time. Some people need to try a few times before they succeed. Do your best to stick to your quit plan, and talk with your doctor if you have any questions or concerns. Summary  Smoking tobacco is the leading cause of preventable death. Quitting smoking can be hard, but it is one of the best things that you can do for your health.  When you decide to quit smoking, make a plan to help you succeed.  Quit smoking right away, not slowly over a period of time.  When you start quitting, seek help from your doctor, family, or friends. This information is not intended to replace advice given to you by your health care provider. Make sure you discuss any questions you have with your health care provider. Document Released: 12/09/2008 Document Revised: 05/02/2018 Document Reviewed: 05/03/2018 Elsevier Patient Education  2020 Elsevier Inc.     Stroke Prevention Some medical conditions and lifestyle choices can lead to a higher risk for a stroke. You can  help to prevent a stroke by making nutrition, lifestyle, and other changes. What nutrition changes can be made?   Eat healthy foods. ? Choose foods that are high in fiber. These include:  Fresh fruits.  Fresh vegetables.  Whole grains. ? Eat at least 5 or more servings of fruits and vegetables each day. Try to fill half of your plate at each meal with fruits and vegetables. ? Choose lean protein foods. These include:  Lowfat (lean) cuts of meat.  Chicken without skin.  Fish.  Tofu.  Beans.  Nuts. ? Eat low-fat dairy products. ? Avoid foods that:  Are high in salt (sodium).  Have saturated fat.  Have trans fat.  Have cholesterol.  Are processed.  Are premade.  Follow eating guidelines as told by your doctor. These may include: ? Reducing how many calories you   eat and drink each day. ? Limiting how much salt you eat or drink each day to 1,500 milligrams (mg). ? Using only healthy fats for cooking. These include:  Olive oil.  Canola oil.  Sunflower oil. ? Counting how many carbohydrates you eat and drink each day. What lifestyle changes can be made?  Try to stay at a healthy weight. Talk to your doctor about what a good weight is for you.  Get at least 30 minutes of moderate physical activity at least 5 days a week. This can include: ? Fast walking. ? Biking. ? Swimming.  Do not use any products that have nicotine or tobacco. This includes cigarettes and e-cigarettes. If you need help quitting, ask your doctor. Avoid being around tobacco smoke in general.  Limit how much alcohol you drink to no more than 1 drink a day for nonpregnant women and 2 drinks a day for men. One drink equals 12 oz of beer, 5 oz of wine, or 1 oz of hard liquor.  Do not use drugs.  Avoid taking birth control pills. Talk to your doctor about the risks of taking birth control pills if: ? You are over 35 years old. ? You smoke. ? You get migraines. ? You have had a blood clot.  What other changes can be made?  Manage your cholesterol. ? It is important to eat a healthy diet. ? If your cholesterol cannot be managed through your diet, you may also need to take medicines. Take medicines as told by your doctor.  Manage your diabetes. ? It is important to eat a healthy diet and to exercise regularly. ? If your blood sugar cannot be managed through diet and exercise, you may need to take medicines. Take medicines as told by your doctor.  Control your high blood pressure (hypertension). ? Try to keep your blood pressure below 130/80. This can help lower your risk of stroke. ? It is important to eat a healthy diet and to exercise regularly. ? If your blood pressure cannot be managed through diet and exercise, you may need to take medicines. Take medicines as told by your doctor. ? Ask your doctor if you should check your blood pressure at home. ? Have your blood pressure checked every year. Do this even if your blood pressure is normal.  Talk to your doctor about getting checked for a sleep disorder. Signs of this can include: ? Snoring a lot. ? Feeling very tired.  Take over-the-counter and prescription medicines only as told by your doctor. These may include aspirin or blood thinners (antiplatelets or anticoagulants).  Make sure that any other medical conditions you have are managed. Where to find more information  American Stroke Association: www.strokeassociation.org  National Stroke Association: www.stroke.org Get help right away if:  You have any symptoms of stroke. "BE FAST" is an easy way to remember the main warning signs: ? B - Balance. Signs are dizziness, sudden trouble walking, or loss of balance. ? E - Eyes. Signs are trouble seeing or a sudden change in how you see. ? F - Face. Signs are sudden weakness or loss of feeling of the face, or the face or eyelid drooping on one side. ? A - Arms. Signs are weakness or loss of feeling in an arm. This  happens suddenly and usually on one side of the body. ? S - Speech. Signs are sudden trouble speaking, slurred speech, or trouble understanding what people say. ? T - Time. Time to call emergency   services. Write down what time symptoms started.  You have other signs of stroke, such as: ? A sudden, very bad headache with no known cause. ? Feeling sick to your stomach (nausea). ? Throwing up (vomiting). ? Jerky movements you cannot control (seizure). These symptoms may represent a serious problem that is an emergency. Do not wait to see if the symptoms will go away. Get medical help right away. Call your local emergency services (911 in the [place] Do not drive yourself to the hospital. Summary  You can prevent a stroke by eating healthy, exercising, not smoking, drinking less alcohol, and treating other health problems, such as diabetes, high blood pressure, or high cholesterol.  Do not use any products that contain nicotine or tobacco, such as cigarettes and e-cigarettes.  Get help right away if you have any signs or symptoms of a stroke. This information is not intended to replace advice given to you by your health care provider. Make sure you discuss any questions you have with your health care provider. Document Released: 08/14/2011 Document Revised: 04/10/2018 Document Reviewed: 05/16/2016 Elsevier Patient Education  2020 Elsevier Inc.     Peripheral Vascular Disease  Peripheral vascular disease (PVD) is a disease of the blood vessels that are not part of your heart and brain. A simple term for PVD is poor circulation. In most cases, PVD narrows the blood vessels that carry blood from your heart to the rest of your body. This can reduce the supply of blood to your arms, legs, and internal organs, like your stomach or kidneys. However, PVD most often affects a person's lower legs and feet. Without treatment, PVD tends to get worse. PVD can also lead to acute ischemic limb. This is when  an arm or leg suddenly cannot get enough blood. This is a medical emergency. Follow these instructions at home: Lifestyle  Do not use any products that contain nicotine or tobacco, such as cigarettes and e-cigarettes. If you need help quitting, ask your doctor.  Lose weight if you are overweight. Or, stay at a healthy weight as told by your doctor.  Eat a diet that is low in fat and cholesterol. If you need help, ask your doctor.  Exercise regularly. Ask your doctor for activities that are right for you. General instructions  Take over-the-counter and prescription medicines only as told by your doctor.  Take good care of your feet: ? Wear comfortable shoes that fit well. ? Check your feet often for any cuts or sores.  Keep all follow-up visits as told by your doctor This is important. Contact a doctor if:  You have cramps in your legs when you walk.  You have leg pain when you are at rest.  You have coldness in a leg or foot.  Your skin changes.  You are unable to get or have an erection (erectile dysfunction).  You have cuts or sores on your feet that do not heal. Get help right away if:  Your arm or leg turns cold, numb, and blue.  Your arms or legs become red, warm, swollen, painful, or numb.  You have chest pain.  You have trouble breathing.  You suddenly have weakness in your face, arm, or leg.  You become very confused or you cannot speak.  You suddenly have a very bad headache.  You suddenly cannot see. Summary  Peripheral vascular disease (PVD) is a disease of the blood vessels.  A simple term for PVD is poor circulation. Without treatment, PVD tends   to get worse.  Treatment may include exercise, low fat and low cholesterol diet, and quitting smoking. This information is not intended to replace advice given to you by your health care provider. Make sure you discuss any questions you have with your health care provider. Document Released: 05/09/2009  Document Revised: 01/25/2017 Document Reviewed: 03/22/2016 Elsevier Patient Education  2020 Elsevier Inc.  

## 2018-11-07 ENCOUNTER — Telehealth (HOSPITAL_COMMUNITY): Payer: Self-pay | Admitting: Rehabilitation

## 2018-11-07 NOTE — Telephone Encounter (Signed)

## 2018-11-10 ENCOUNTER — Other Ambulatory Visit: Payer: Self-pay | Admitting: *Deleted

## 2018-11-10 ENCOUNTER — Encounter: Payer: Self-pay | Admitting: Surgery

## 2018-11-10 ENCOUNTER — Other Ambulatory Visit: Payer: Self-pay

## 2018-11-10 ENCOUNTER — Ambulatory Visit (INDEPENDENT_AMBULATORY_CARE_PROVIDER_SITE_OTHER): Payer: Federal, State, Local not specified - PPO | Admitting: Surgery

## 2018-11-10 VITALS — BP 154/70 | HR 82 | Temp 97.8°F | Resp 18 | Ht 65.0 in | Wt 173.5 lb

## 2018-11-10 DIAGNOSIS — I70213 Atherosclerosis of native arteries of extremities with intermittent claudication, bilateral legs: Secondary | ICD-10-CM

## 2018-11-10 NOTE — Progress Notes (Signed)
[facility] [place]  Patient name: Tina HailConnie Navarro MRN: [id number] DOB: 07/07/1960 Sex: female   REASON FOR VISIT:    Follow up claudicatiaon  HISOTRY OF PRESENT ILLNESS:    Tina Navarro is a 58 y.o. female I saw in 2019  for evaluation of bilateral leg pain, right greater than left.  The patient states that she has had percutaneous intervention in 2007.  At that time both legs were treated.  She states that she is starting to have recurrent symptoms which include pain in her right buttock and thigh with activity.  These are similar to the symptoms that were alleviated with treatment in 2007.  [facility] and therefore new vascular referral was made.  She does have a family history of PAD and her father who ultimately required amputation.  On 07-16-2017 she had angioplasty of bilateral common iliac artery stenosis with 7mm balloon.  SHe began having symptoms of right buttock claudication   She also reports left arm weakness when washing her hair.  She does have a left subclavian stenosis.   She is medically managed for hypertension with an ARB.  She takes a statin for hypercholesterolemia.  She is a current smoker.   PAST MEDICAL HISTORY:   Past Medical History:  Diagnosis Date  . Emphysematous COPD (HCC) 04/23/2017  . GERD (gastroesophageal reflux disease)   . Hyperlipidemia   . Peripheral arterial disease (HCC)   . Thyroid nodule 04/23/2017     FAMILY HISTORY:   Family History  Problem Relation Age of Onset  . Hypertension Mother   . Hypertension Father   . Heart disease Father   . Peripheral vascular disease Father   . Alcohol abuse Father   . Cancer Maternal Grandmother        BREAST  . Stroke Maternal Grandmother        CEBERAL ATHEROSCLEROSIS  . Cancer - Ovarian Cousin   . Fibroids Sister     SOCIAL HISTORY:   Social History   Tobacco Use  . Smoking status: Current  Every Day Smoker    Packs/day: 0.50    Years: 35.00    Pack years: 17.50    Types: Cigarettes    Last attempt to quit: 12/18/2017    Years since quitting: 0.8  . Smokeless tobacco: Never Used  . Tobacco comment: As per pt - smoking 1/2 pk daily  Substance Use Topics  . Alcohol use: No     ALLERGIES:   Allergies  Allergen Reactions  . Doxycycline Hives, Rash and Other (See Comments)    Pain, photosensitive     CURRENT MEDICATIONS:   Current Outpatient Medications  Medication Sig Dispense Refill  . aspirin 81 MG tablet Take 1 tablet (81 mg total) by mouth daily. 90 tablet 3  . Aspirin-Salicylamide-Caffeine (BC HEADACHE POWDER PO) Take 1 packet by mouth daily as needed (for headache).     Marland Kitchen. atorvastatin (LIPITOR) 40 MG tablet Take 1 tablet (40 mg total) by mouth at bedtime. 90 tablet 3  . clopidogrel (PLAVIX) 75 MG tablet Take 1 tablet (75 mg total) by mouth daily. 90 tablet 3  . estradiol (ESTRACE VAGINAL) 0.1 MG/GM vaginal cream Place 1 Applicatorful vaginally 3 (three) times a week. 42.5 g 12  . gabapentin (NEURONTIN) 300 MG capsule Take 2 capsules (600 mg total) by mouth 3 (three) times daily. 540 capsule 1  . losartan (COZAAR) 100 MG tablet Take 1 tablet (100 mg total)  by mouth daily. 90 tablet 3  . omeprazole (PRILOSEC) 40 MG capsule Take 1 capsule (40 mg total) by mouth 2 (two) times a day. 180 capsule 3  . promethazine (PHENERGAN) 12.5 MG tablet TAKE ONE TABLET BY MOUTH EVERY 6 HOURS AS NEEDED FOR NAUSEA OR vomiting 30 tablet 2  . senna (SENOKOT) 8.6 MG tablet Take 2 tablets (17.2 mg total) by mouth daily. As needed for constipation (Patient taking differently: Take 2 tablets by mouth daily as needed for constipation. ) 90 tablet 1  . vitamin B-12 (CYANOCOBALAMIN) 1000 MCG tablet Take 1,000 mcg by mouth daily.      No current facility-administered medications for this visit.     REVIEW OF SYSTEMS:   [X]  denotes positive finding, [ ]  denotes negative finding Cardiac   Comments:  Chest pain or chest pressure:    Shortness of breath upon exertion:    Short of breath when lying flat:    Irregular heart rhythm:        Vascular    Pain in calf, thigh, or hip brought on by ambulation:    Pain in feet at night that wakes you up from your sleep:     Blood clot in your veins:    Leg swelling:         Pulmonary    Oxygen at home:    Productive cough:     Wheezing:         Neurologic    Sudden weakness in arms or legs:     Sudden numbness in arms or legs:     Sudden onset of difficulty speaking or slurred speech:    Temporary loss of vision in one eye:     Problems with dizziness:         Gastrointestinal    Blood in stool:     Vomited blood:         Genitourinary    Burning when urinating:     Blood in urine:        Psychiatric    Major depression:         Hematologic    Bleeding problems:    Problems with blood clotting too easily:        Skin    Rashes or ulcers:        Constitutional    Fever or chills:      PHYSICAL EXAM:   Vitals:   11/10/18 0818 11/10/18 0821  BP: (!) 166/74 (!) 154/70  Pulse: 82   Resp: 18   Temp: 97.8 F (36.6 C)   SpO2: 98%   Weight: 173 lb 8 oz (78.7 kg)   Height: 5\' 5"  (1.651 m)     GENERAL: The patient is a well-nourished female, in no acute distress. The vital signs are documented above. CARDIAC: There is a regular rate and rhythm.  VASCULAR: I cannot palpate a left radial pulse PULMONARY: Non-labored respirations MUSCULOSKELETAL: There are no major deformities or cyanosis. NEUROLOGIC: No focal weakness or paresthesias are detected. SKIN: There are no ulcers or rashes noted. PSYCHIATRIC: The patient has a normal affect.  STUDIES:   I have reviewed the following duple exams:  Summary: Stenosis: +------------------+---------------+ Location          Stent           +------------------+---------------+ Right Common Iliac50-99% stenosis +------------------+---------------+ Left  Common Iliac no stenosis     +------------------+---------------+  +-------+-----------+-----------+------------+------------+ ABI/TBIToday's ABIToday's TBIPrevious ABIPrevious TBI +-------+-----------+-----------+------------+------------+ Right  0.88  0.64       1.01        0.83         +-------+-----------+-----------+------------+------------+ Left   1.00       0.83       1.15        0.92         +-------+-----------+-----------+------------+------------+    MEDICAL ISSUES:   Claudication: The patient has had a decrease in her ABIs on the right as well as a finding of stenosis within the right iliac stent.  I have recommended proceeding with intervention at her earliest convenience.  She originally had stents placed in 2007 and I performed balloon angioplasty in 2019.  She did not have a durable result with balloon angioplasty, and so I think she needs to have these treated with covered stents.  This is been scheduled for November 10.  She continues to be on aspirin Plavix and a statin.  Left arm: She has approximately a 12 point gradient and blood pressure between her 2 arms.  Duplex suggest proximal subclavian stenosis.  I will plan on imaging her proximal left subclavian artery by shooting a arch angiogram.  Because her symptoms are limited, I would not recommend intervening at this time.    Charlena Cross, MD, FACS [facility] [phone] Pager [phone]

## 2018-11-10 NOTE — Progress Notes (Signed)
Call from patient desires to schedule procedure for 12/02/2018. Instructed to be at [facility] admitting at 10 am. NPO past MN night prior and must have a driver and caregiver for discharge. Nasal swab testing at [facility] 11/28/2018 at 11 am. Hold 12/02/2018  am medications until after this procedure. Verbalized understanding.

## 2018-11-10 NOTE — H&P (View-Only) (Signed)
[facility] [place]  Patient name: Tina Navarro MRN: [id number] DOB: 07/07/1960 Sex: female   REASON FOR VISIT:    Follow up claudicatiaon  HISOTRY OF PRESENT ILLNESS:    Tina Navarro is a 58 y.o. female I saw in 2019  for evaluation of bilateral leg pain, right greater than left.  The patient states that she has had percutaneous intervention in 2007.  At that time both legs were treated.  She states that she is starting to have recurrent symptoms which include pain in her right buttock and thigh with activity.  These are similar to the symptoms that were alleviated with treatment in 2007.  She has [facility] and therefore new vascular referral was made.  She does have a family history of PAD and her father who ultimately required amputation.  On 07-16-2017 she had angioplasty of bilateral common iliac artery stenosis with 7mm balloon.  SHe began having symptoms of right buttock claudication   She also reports left arm weakness when washing her hair.  She does have a left subclavian stenosis.   She is medically managed for hypertension with an ARB.  She takes a statin for hypercholesterolemia.  She is a current smoker.   PAST MEDICAL HISTORY:   Past Medical History:  Diagnosis Date  . Emphysematous COPD (HCC) 04/23/2017  . GERD (gastroesophageal reflux disease)   . Hyperlipidemia   . Peripheral arterial disease (HCC)   . Thyroid nodule 04/23/2017     FAMILY HISTORY:   Family History  Problem Relation Age of Onset  . Hypertension Mother   . Hypertension Father   . Heart disease Father   . Peripheral vascular disease Father   . Alcohol abuse Father   . Cancer Maternal Grandmother        BREAST  . Stroke Maternal Grandmother        CEBERAL ATHEROSCLEROSIS  . Cancer - Ovarian Cousin   . Fibroids Sister     SOCIAL HISTORY:   Social History   Tobacco Use  . Smoking status: Current  Every Day Smoker    Packs/day: 0.50    Years: 35.00    Pack years: 17.50    Types: Cigarettes    Last attempt to quit: 12/18/2017    Years since quitting: 0.8  . Smokeless tobacco: Never Used  . Tobacco comment: As per pt - smoking 1/2 pk daily  Substance Use Topics  . Alcohol use: No     ALLERGIES:   Allergies  Allergen Reactions  . Doxycycline Hives, Rash and Other (See Comments)    Pain, photosensitive     CURRENT MEDICATIONS:   Current Outpatient Medications  Medication Sig Dispense Refill  . aspirin 81 MG tablet Take 1 tablet (81 mg total) by mouth daily. 90 tablet 3  . Aspirin-Salicylamide-Caffeine (BC HEADACHE POWDER PO) Take 1 packet by mouth daily as needed (for headache).     Marland Kitchen. atorvastatin (LIPITOR) 40 MG tablet Take 1 tablet (40 mg total) by mouth at bedtime. 90 tablet 3  . clopidogrel (PLAVIX) 75 MG tablet Take 1 tablet (75 mg total) by mouth daily. 90 tablet 3  . estradiol (ESTRACE VAGINAL) 0.1 MG/GM vaginal cream Place 1 Applicatorful vaginally 3 (three) times a week. 42.5 g 12  . gabapentin (NEURONTIN) 300 MG capsule Take 2 capsules (600 mg total) by mouth 3 (three) times daily. 540 capsule 1  . losartan (COZAAR) 100 MG tablet Take 1 tablet (100 mg total)  by mouth daily. 90 tablet 3  . omeprazole (PRILOSEC) 40 MG capsule Take 1 capsule (40 mg total) by mouth 2 (two) times a day. 180 capsule 3  . promethazine (PHENERGAN) 12.5 MG tablet TAKE ONE TABLET BY MOUTH EVERY 6 HOURS AS NEEDED FOR NAUSEA OR vomiting 30 tablet 2  . senna (SENOKOT) 8.6 MG tablet Take 2 tablets (17.2 mg total) by mouth daily. As needed for constipation (Patient taking differently: Take 2 tablets by mouth daily as needed for constipation. ) 90 tablet 1  . vitamin B-12 (CYANOCOBALAMIN) 1000 MCG tablet Take 1,000 mcg by mouth daily.      No current facility-administered medications for this visit.     REVIEW OF SYSTEMS:   [X]  denotes positive finding, [ ]  denotes negative finding Cardiac   Comments:  Chest pain or chest pressure:    Shortness of breath upon exertion:    Short of breath when lying flat:    Irregular heart rhythm:        Vascular    Pain in calf, thigh, or hip brought on by ambulation:    Pain in feet at night that wakes you up from your sleep:     Blood clot in your veins:    Leg swelling:         Pulmonary    Oxygen at home:    Productive cough:     Wheezing:         Neurologic    Sudden weakness in arms or legs:     Sudden numbness in arms or legs:     Sudden onset of difficulty speaking or slurred speech:    Temporary loss of vision in one eye:     Problems with dizziness:         Gastrointestinal    Blood in stool:     Vomited blood:         Genitourinary    Burning when urinating:     Blood in urine:        Psychiatric    Major depression:         Hematologic    Bleeding problems:    Problems with blood clotting too easily:        Skin    Rashes or ulcers:        Constitutional    Fever or chills:      PHYSICAL EXAM:   Vitals:   11/10/18 0818 11/10/18 0821  BP: (!) 166/74 (!) 154/70  Pulse: 82   Resp: 18   Temp: 97.8 F (36.6 C)   SpO2: 98%   Weight: 173 lb 8 oz (78.7 kg)   Height: 5\' 5"  (1.651 m)     GENERAL: The patient is a well-nourished female, in no acute distress. The vital signs are documented above. CARDIAC: There is a regular rate and rhythm.  VASCULAR: I cannot palpate a left radial pulse PULMONARY: Non-labored respirations MUSCULOSKELETAL: There are no major deformities or cyanosis. NEUROLOGIC: No focal weakness or paresthesias are detected. SKIN: There are no ulcers or rashes noted. PSYCHIATRIC: The patient has a normal affect.  STUDIES:   I have reviewed the following duple exams:  Summary: Stenosis: +------------------+---------------+ Location          Stent           +------------------+---------------+ Right Common Iliac50-99% stenosis +------------------+---------------+ Left  Common Iliac no stenosis     +------------------+---------------+  +-------+-----------+-----------+------------+------------+ ABI/TBIToday's ABIToday's TBIPrevious ABIPrevious TBI +-------+-----------+-----------+------------+------------+ Right  0.88  0.64       1.01        0.83         +-------+-----------+-----------+------------+------------+ Left   1.00       0.83       1.15        0.92         +-------+-----------+-----------+------------+------------+    MEDICAL ISSUES:   Claudication: The patient has had a decrease in her ABIs on the right as well as a finding of stenosis within the right iliac stent.  I have recommended proceeding with intervention at her earliest convenience.  She originally had stents placed in 2007 and I performed balloon angioplasty in 2019.  She did not have a durable result with balloon angioplasty, and so I think she needs to have these treated with covered stents.  This is been scheduled for November 10.  She continues to be on aspirin Plavix and a statin.  Left arm: She has approximately a 12 point gradient and blood pressure between her 2 arms.  Duplex suggest proximal subclavian stenosis.  I will plan on imaging her proximal left subclavian artery by shooting a arch angiogram.  Because her symptoms are limited, I would not recommend intervening at this time.    Wells Zarie Kosiba, IV, MD, FACS [facility] [place] Tel [phone] Pager [phone] 

## 2018-11-18 ENCOUNTER — Ambulatory Visit (INDEPENDENT_AMBULATORY_CARE_PROVIDER_SITE_OTHER): Payer: Federal, State, Local not specified - PPO

## 2018-11-18 ENCOUNTER — Other Ambulatory Visit: Payer: Self-pay

## 2018-11-18 ENCOUNTER — Ambulatory Visit (INDEPENDENT_AMBULATORY_CARE_PROVIDER_SITE_OTHER): Payer: Federal, State, Local not specified - PPO | Admitting: Family Medicine

## 2018-11-18 ENCOUNTER — Encounter: Payer: Self-pay | Admitting: Family Medicine

## 2018-11-18 VITALS — BP 157/88 | HR 94 | Temp 98.1°F | Wt 172.0 lb

## 2018-11-18 DIAGNOSIS — M25512 Pain in left shoulder: Secondary | ICD-10-CM

## 2018-11-18 MED ORDER — DICLOFENAC SODIUM 1 % TD GEL: 4.0000 g | Freq: Four times a day (QID) | TRANSDERMAL | 11 refills | Status: DC

## 2018-11-18 NOTE — Patient Instructions (Addendum)
Thank you for coming in today. Do the home exercises.  Up to the level of the shoulder to the side seems the winner.  Use diclofenac gel on the shoulder up to 4x daily for pain.  Attend PT after the STENT procedure.  Recheck if not better.  Next step is likely injection.

## 2018-11-18 NOTE — Progress Notes (Signed)
Tina Navarro is a 58 y.o. female who presents to [facility] today for left shoulder pain present for the last few months. No recent injury.  She notes that she had a fall about 20 years ago but cannot think of any recent injury.  Pain is located in the lateral upper arm.  Pain is worse with reaching back.  She denies any pain radiating down her arm weakness or numbness distally.  No fevers or chills.  She is tried some over-the-counter medications for pain with no much benefit.    ROS:  As above  Exam:  BP (!) 157/88   Pulse 94   Temp 98.1 F (36.7 C) (Oral)   Wt 172 lb (78 kg)   LMP  (LMP Unknown)   BMI 28.62 kg/m  Wt Readings from Last 5 Encounters:  11/18/18 172 lb (78 kg)  11/10/18 173 lb 8 oz (78.7 kg)  10/09/18 176 lb 11.2 oz (80.2 kg)  07/16/18 177 lb (80.3 kg)  03/20/18 175 lb (79.4 kg)   General: Well Developed, well nourished, and in no acute distress.  Neuro/Psych: Alert and oriented x3, extra-ocular muscles intact, able to move all 4 extremities, sensation grossly intact. Skin: Warm and dry, no rashes noted.  Respiratory: Not using accessory muscles, speaking in full sentences, trachea midline.  Cardiovascular: Pulses palpable, no extremity edema. Abdomen: Does not appear distended. MSK: Left shoulder normal-appearing nontender. Range of motion: Normal abduction. Internal rotation limited to lumbar spine. Normal external rotation range of motion. Intact strength. Mildly positive empty can test.  Negative Hawkins and Neer's test.   Right shoulder normal-appearing nontender normal motion normal strength negative impingement testing.    Lab and Radiology Results X-ray images left shoulder obtained today personally independently reviewed No acute fractures.  Mild AC DJD. Await formal radiology review    Assessment and Plan: 58 y.o. female with left shoulder pain.  Etiology is somewhat unclear.  She does not  have classic rotator cuff tendinitis exam findings.  We discussed treatment options.  Patient would like to avoid injection at this time if possible.  Plan to proceed with trial of physical therapy, home exercise program, and diclofenac gel.  Recheck in 4 to 6 weeks.  Return sooner if needed.   PDMP not reviewed this encounter. Orders Placed This Encounter  Procedures  . DG Shoulder Left    Standing Status:   Future    Number of Occurrences:   1    Standing Expiration Date:   01/18/2020    Order Specific Question:   Reason for Exam (SYMPTOM  OR DIAGNOSIS REQUIRED)    Answer:   eval shoudler pain    Order Specific Question:   Is patient pregnant?    Answer:   No    Order Specific Question:   Preferred imaging location?    Answer:   [facility]    Order Specific Question:   Radiology Contrast Protocol - do NOT remove file path    Answer:   [email or web address]  . Ambulatory referral to Physical Therapy    Referral Priority:   Routine    Referral Type:   Physical Medicine    Referral Reason:   Specialty Services Required    Requested Specialty:   Physical Therapy   Meds ordered this encounter  Medications  . diclofenac sodium (VOLTAREN) 1 % GEL    Sig: Apply 4 g topically 4 (four) times daily. To affected joint.    Dispense:  100 g    Refill:  11    Historical information moved to improve visibility of documentation.  Past Medical History:  Diagnosis Date  . Emphysematous COPD (HCC) 04/23/2017  . GERD (gastroesophageal reflux disease)   . Hyperlipidemia   . Peripheral arterial disease (HCC)   . Thyroid nodule 04/23/2017   Past Surgical History:  Procedure Laterality Date  . ABDOMINAL AORTOGRAM N/A 07/16/2017   Procedure: ABDOMINAL AORTOGRAM;  Surgeon: Nada Libman, MD;  Location: [facility] INVASIVE CV LAB;  Service: Cardiovascular;  Laterality: N/A;  . ENDOMETRIAL BIOPSY  2005  . FEMORAL ARTERY STENT Bilateral 2008  . LOWER  EXTREMITY ANGIOGRAPHY Bilateral 07/16/2017   Procedure: Lower Extremity Angiography;  Surgeon: Nada Libman, MD;  Location: [facility] INVASIVE CV LAB;  Service: Cardiovascular;  Laterality: Bilateral;  . NASAL SINUS SURGERY    . PERIPHERAL VASCULAR BALLOON ANGIOPLASTY Bilateral 07/16/2017   Procedure: PERIPHERAL VASCULAR BALLOON ANGIOPLASTY;  Surgeon: Nada Libman, MD;  Location: [facility] INVASIVE CV LAB;  Service: Cardiovascular;  Laterality: Bilateral;  iliacs  . TONSILLECTOMY    . TUBAL LIGATION     Social History   Tobacco Use  . Smoking status: Current Every Day Smoker    Packs/day: 0.50    Years: 35.00    Pack years: 17.50    Types: Cigarettes    Last attempt to quit: 12/18/2017    Years since quitting: 0.9  . Smokeless tobacco: Never Used  . Tobacco comment: As per pt - smoking 1/2 pk daily  Substance Use Topics  . Alcohol use: No   family history includes Alcohol abuse in her father; Cancer in her maternal grandmother; Cancer - Ovarian in her cousin; Fibroids in her sister; Heart disease in her father; Hypertension in her father and mother; Peripheral vascular disease in her father; Stroke in her maternal grandmother.  Medications: Current Outpatient Medications  Medication Sig Dispense Refill  . aspirin 81 MG tablet Take 1 tablet (81 mg total) by mouth daily. 90 tablet 3  . Aspirin-Salicylamide-Caffeine (BC HEADACHE POWDER PO) Take 1 packet by mouth daily as needed (for headache).     Marland Kitchen atorvastatin (LIPITOR) 40 MG tablet Take 1 tablet (40 mg total) by mouth at bedtime. 90 tablet 3  . clopidogrel (PLAVIX) 75 MG tablet Take 1 tablet (75 mg total) by mouth daily. 90 tablet 3  . diclofenac sodium (VOLTAREN) 1 % GEL Apply 4 g topically 4 (four) times daily. To affected joint. 100 g 11  . estradiol (ESTRACE VAGINAL) 0.1 MG/GM vaginal cream Place 1 Applicatorful vaginally 3 (three) times a week. 42.5 g 12  . gabapentin (NEURONTIN) 300 MG capsule Take 2 capsules (600 mg total) by mouth 3  (three) times daily. 540 capsule 1  . losartan (COZAAR) 100 MG tablet Take 1 tablet (100 mg total) by mouth daily. 90 tablet 3  . omeprazole (PRILOSEC) 40 MG capsule Take 1 capsule (40 mg total) by mouth 2 (two) times a day. 180 capsule 3  . promethazine (PHENERGAN) 12.5 MG tablet TAKE ONE TABLET BY MOUTH EVERY 6 HOURS AS NEEDED FOR NAUSEA OR vomiting 30 tablet 2  . senna (SENOKOT) 8.6 MG tablet Take 2 tablets (17.2 mg total) by mouth daily. As needed for constipation (Patient taking differently: Take 2 tablets by mouth daily as needed for constipation. ) 90 tablet 1  . vitamin B-12 (CYANOCOBALAMIN) 1000 MCG tablet Take 1,000 mcg by mouth daily.      No current facility-administered medications for this visit.  Allergies  Allergen Reactions  . Doxycycline Hives, Rash and Other (See Comments)    Pain, photosensitive      Discussed warning signs or symptoms. Please see discharge instructions. Patient expresses understanding.

## 2018-11-27 ENCOUNTER — Encounter: Payer: Self-pay | Admitting: Osteopathic Medicine

## 2018-11-27 DIAGNOSIS — I739 Peripheral vascular disease, unspecified: Secondary | ICD-10-CM

## 2018-11-28 ENCOUNTER — Other Ambulatory Visit (HOSPITAL_COMMUNITY)
Admission: RE | Admit: 2018-11-28 | Discharge: 2018-11-28 | Disposition: A | Discharge status: Home / Self Care | Payer: Federal, State, Local not specified - PPO | Source: Ambulatory Visit | Attending: Surgery | Admitting: Surgery

## 2018-11-28 DIAGNOSIS — Z01812 Encounter for preprocedural laboratory examination: Secondary | ICD-10-CM | POA: Insufficient documentation

## 2018-11-28 DIAGNOSIS — I739 Peripheral vascular disease, unspecified: Secondary | ICD-10-CM | POA: Diagnosis not present

## 2018-11-28 DIAGNOSIS — Z20828 Contact with and (suspected) exposure to other viral communicable diseases: Secondary | ICD-10-CM | POA: Diagnosis not present

## 2018-11-28 MED ORDER — ATORVASTATIN CALCIUM 80 MG PO TABS: 80.0000 mg | ORAL_TABLET | Freq: Every day | ORAL | 3 refills | Status: DC

## 2018-11-28 NOTE — Telephone Encounter (Signed)
rx sent

## 2018-11-30 LAB — NOVEL CORONAVIRUS, NAA (HOSP ORDER, SEND-OUT TO REF LAB; TAT 18-24 HRS): SARS-CoV-2, NAA: NOT DETECTED

## 2018-12-02 ENCOUNTER — Ambulatory Visit (HOSPITAL_COMMUNITY)
Admission: RE | Admit: 2018-12-02 | Discharge: 2018-12-02 | Disposition: A | Discharge status: Home / Self Care | Payer: Federal, State, Local not specified - PPO | Attending: Surgery | Admitting: Surgery

## 2018-12-02 ENCOUNTER — Encounter (HOSPITAL_COMMUNITY)
Admission: RE | Disposition: A | Discharge status: Home / Self Care | Payer: Self-pay | Source: Home / Self Care | Attending: Surgery

## 2018-12-02 ENCOUNTER — Other Ambulatory Visit: Payer: Self-pay

## 2018-12-02 DIAGNOSIS — I70213 Atherosclerosis of native arteries of extremities with intermittent claudication, bilateral legs: Secondary | ICD-10-CM | POA: Diagnosis not present

## 2018-12-02 DIAGNOSIS — I771 Stricture of artery: Secondary | ICD-10-CM | POA: Diagnosis not present

## 2018-12-02 DIAGNOSIS — Z7902 Long term (current) use of antithrombotics/antiplatelets: Secondary | ICD-10-CM | POA: Insufficient documentation

## 2018-12-02 DIAGNOSIS — E7800 Pure hypercholesterolemia, unspecified: Secondary | ICD-10-CM | POA: Diagnosis not present

## 2018-12-02 DIAGNOSIS — F17210 Nicotine dependence, cigarettes, uncomplicated: Secondary | ICD-10-CM | POA: Insufficient documentation

## 2018-12-02 DIAGNOSIS — Z79899 Other long term (current) drug therapy: Secondary | ICD-10-CM | POA: Insufficient documentation

## 2018-12-02 DIAGNOSIS — Z881 Allergy status to other antibiotic agents status: Secondary | ICD-10-CM | POA: Diagnosis not present

## 2018-12-02 DIAGNOSIS — Z7982 Long term (current) use of aspirin: Secondary | ICD-10-CM | POA: Diagnosis not present

## 2018-12-02 DIAGNOSIS — I6523 Occlusion and stenosis of bilateral carotid arteries: Secondary | ICD-10-CM | POA: Diagnosis not present

## 2018-12-02 DIAGNOSIS — J439 Emphysema, unspecified: Secondary | ICD-10-CM | POA: Insufficient documentation

## 2018-12-02 DIAGNOSIS — I10 Essential (primary) hypertension: Secondary | ICD-10-CM | POA: Diagnosis not present

## 2018-12-02 DIAGNOSIS — E785 Hyperlipidemia, unspecified: Secondary | ICD-10-CM | POA: Diagnosis not present

## 2018-12-02 DIAGNOSIS — Z8249 Family history of ischemic heart disease and other diseases of the circulatory system: Secondary | ICD-10-CM | POA: Diagnosis not present

## 2018-12-02 DIAGNOSIS — K219 Gastro-esophageal reflux disease without esophagitis: Secondary | ICD-10-CM | POA: Insufficient documentation

## 2018-12-02 HISTORY — PX: PERIPHERAL VASCULAR INTERVENTION: CATH118257

## 2018-12-02 HISTORY — PX: AORTIC ARCH ANGIOGRAPHY: CATH118224

## 2018-12-02 HISTORY — PX: ABDOMINAL AORTOGRAM W/LOWER EXTREMITY: CATH118223

## 2018-12-02 LAB — POCT I-STAT, CHEM 8
BUN: 12 mg/dL (ref 6–20)
Calcium, Ion: 1.17 mmol/L (ref 1.15–1.40)
Chloride: 103 mmol/L (ref 98–111)
Creatinine, Ser: 0.8 mg/dL (ref 0.44–1.00)
Glucose, Bld: 90 mg/dL (ref 70–99)
HCT: 43 % (ref 36.0–46.0)
Hemoglobin: 14.6 g/dL (ref 12.0–15.0)
Potassium: 3.7 mmol/L (ref 3.5–5.1)
Sodium: 142 mmol/L (ref 135–145)
TCO2: 24 mmol/L (ref 22–32)

## 2018-12-02 SURGERY — ABDOMINAL AORTOGRAM W/LOWER EXTREMITY
Anesthesia: LOCAL

## 2018-12-02 MED ORDER — HEPARIN (PORCINE) IN NACL 1000-0.9 UT/500ML-% IV SOLN
INTRAVENOUS | Status: AC
  Filled 2018-12-02: qty 1000

## 2018-12-02 MED ORDER — HEPARIN SODIUM (PORCINE) 1000 UNIT/ML IJ SOLN
INTRAMUSCULAR | Status: AC
  Filled 2018-12-02: qty 1

## 2018-12-02 MED ORDER — HEPARIN SODIUM (PORCINE) 1000 UNIT/ML IJ SOLN
INTRAMUSCULAR | Status: DC | PRN
  Administered 2018-12-02: 8000 [IU] via INTRAVENOUS

## 2018-12-02 MED ORDER — LIDOCAINE HCL (PF) 1 % IJ SOLN
INTRAMUSCULAR | Status: AC
  Filled 2018-12-02: qty 30

## 2018-12-02 MED ORDER — FENTANYL CITRATE (PF) 100 MCG/2ML IJ SOLN
INTRAMUSCULAR | Status: DC | PRN
  Administered 2018-12-02: 50 ug via INTRAVENOUS

## 2018-12-02 MED ORDER — SODIUM CHLORIDE 0.9 % IV SOLN: INTRAVENOUS | Status: DC

## 2018-12-02 MED ORDER — MIDAZOLAM HCL 2 MG/2ML IJ SOLN
INTRAMUSCULAR | Status: DC | PRN
  Administered 2018-12-02: 2 mg via INTRAVENOUS

## 2018-12-02 MED ORDER — HEPARIN (PORCINE) IN NACL 1000-0.9 UT/500ML-% IV SOLN
INTRAVENOUS | Status: DC | PRN
  Administered 2018-12-02 (×2): 500 mL

## 2018-12-02 MED ORDER — FENTANYL CITRATE (PF) 100 MCG/2ML IJ SOLN
INTRAMUSCULAR | Status: AC
  Filled 2018-12-02: qty 2

## 2018-12-02 MED ORDER — MIDAZOLAM HCL 2 MG/2ML IJ SOLN
INTRAMUSCULAR | Status: AC
  Filled 2018-12-02: qty 2

## 2018-12-02 MED ORDER — LIDOCAINE HCL (PF) 1 % IJ SOLN
INTRAMUSCULAR | Status: DC | PRN
  Administered 2018-12-02: 10 mL via INTRADERMAL
  Administered 2018-12-02: 20 mL via INTRADERMAL

## 2018-12-02 MED ORDER — IODIXANOL 320 MG/ML IV SOLN
INTRAVENOUS | Status: DC | PRN
  Administered 2018-12-02: 125 mL via INTRA_ARTERIAL

## 2018-12-02 SURGICAL SUPPLY — 15 items
CATH ANGIO 5F PIGTAIL 100CM (CATHETERS) ×1 IMPLANT
CLOSURE MYNX CONTROL 6F/7F (Vascular Products) ×2 IMPLANT
KIT ENCORE 26 ADVANTAGE (KITS) ×2 IMPLANT
KIT MICROPUNCTURE NIT STIFF (SHEATH) ×1 IMPLANT
KIT PV (KITS) ×3 IMPLANT
SHEATH BRITE TIP 7FR 35CM (SHEATH) ×2 IMPLANT
SHEATH PINNACLE 5F 10CM (SHEATH) ×1 IMPLANT
SHEATH PINNACLE 7F 10CM (SHEATH) ×2 IMPLANT
SHEATH PROBE COVER 6X72 (BAG) ×2 IMPLANT
STENT VIABAHN 8X39X80 VBX (Permanent Stent) ×2 IMPLANT
SYR MEDRAD MARK V 150ML (SYRINGE) ×1 IMPLANT
TRANSDUCER W/STOPCOCK (MISCELLANEOUS) ×3 IMPLANT
TRAY PV CATH (CUSTOM PROCEDURE TRAY) ×3 IMPLANT
WIRE BENTSON .035X145CM (WIRE) ×1 IMPLANT
WIRE ROSEN-J .035X180CM (WIRE) ×2 IMPLANT

## 2018-12-02 NOTE — Progress Notes (Signed)
No bleeding or hematoma noted after ambulation 

## 2018-12-02 NOTE — Op Note (Signed)
Patient name: Tina Navarro MRN: [id number] DOB: 1960/06/27 Sex: female  12/02/2018 Pre-operative Diagnosis: claudication Post-operative diagnosis:  Same Surgeon:  Annamarie Major Procedure Performed:  1.  Ultrasound-guided access, left femoral artery  2.  Ultrasound-guided access, right femoral artery  3.  Aortic arch angiogram  4.  Abdominal aortogram  5.  Stent, right common iliac artery  6.  Stent, left common iliac artery  7.  Conscious sedation (36 minutes)  8.  Closure device x2 (Mynx)   Indications: The patient has had kissing iliac stents placed over a decade ago.  She developed recurrent stenosis a year ago and had balloon angioplasty.  She has had recurrence of symptoms and her stenosis within a year.  She comes back in today for further evaluation of possible dementia.  She is also been worried about asymmetric blood pressures in her arm with subclavian stenosis.  We discussed evaluating this as well.  Procedure:  The patient was identified in the holding area and taken to room 8.  The patient was then placed supine on the table and prepped and draped in the usual sterile fashion.  A time out was called.  Conscious sedation was administered with the use of IV fentanyl and Versed under continuous physician and nurse monitoring.  Heart rate, blood pressure, and oxygen saturation were continuously monitored.  Total sedation time was 36 minutes.  Ultrasound was used to evaluate the right common femoral artery.  It was patent .  A digital ultrasound image was acquired.  A micropuncture needle was used to access the right common femoral artery under ultrasound guidance.  An 018 wire was advanced without resistance and a micropuncture sheath was placed.  The 018 wire was removed and a benson wire was placed.  The micropuncture sheath was exchanged for a 5 french sheath.  A pigtail catheter was advanced into the ascending aorta and aortic arch angiogram was performed.  Next cath was pulled  down to the level L1 and an abdominal aortogram was performed followed by pelvic imaging in multiple obliquities. Findings:   Aortogram: No significant renal stenosis.  The infrarenal abdominal aorta is patent throughout its course.  It is ectatic in its midportion.  Stents are visualized in the left and right common iliac artery.  There does appear to be luminal narrowing with irregular plaque in the right stent with stenosis of approximately 50%.  The left side also had narrowing at its origin about 50%.  The external iliac arteries are widely patent.  Aortic arch: A type I aortic arch is visualized.  Calcification is noted within the innominate artery which is widely patent without stenosis.  The origin of the right subclavian and right carotid artery appear to be widely patent.  The left common carotid artery is patent throughout its course with approximately 20 to 30% narrowing at its origin.  The subclavian artery is occluded at its origin.  There is delayed reconstitution of the subclavian artery distal to the vertebral artery.  Intervention: After the above images were acquired the decision was made to proceed with intervention.  A second access in the left groin was obtained under ultrasound guidance.  7 French bright tip sheaths were placed bilaterally over a Rosen wires.  I selected a 8 x 39 VBX stents.  These were deployed in kissing fashion extending just proximal to the previous stents.  The balloons were taken to 12 atm.  Follow-up imaging revealed resolution of the stenosis.  The stents are widely patent.  The bright tip sheath were exchanged out for short sheaths and minx devices were used for closure without complication.  Impression:  #1  Left subclavian artery occlusion  #2  Approximately 50% bilateral in-stent stenosis.  The previous stents were realigned by placing kissing 8 x 39 VBX stents with no residual stenosis     V. Annamarie Major, M.D., [facility]  [place] Office: [phone] Pager:  [phone]

## 2018-12-02 NOTE — Interval H&P Note (Signed)
History and Physical Interval Note:  12/02/2018 11:08 PM  Tina Navarro  has presented today for surgery, with the diagnosis of PVD.  The various methods of treatment have been discussed with the patient and family. After consideration of risks, benefits and other options for treatment, the patient has consented to  Procedure(s): ABDOMINAL AORTOGRAM W/LOWER EXTREMITY (Bilateral) AORTIC ARCH ANGIOGRAPHY (N/A) PERIPHERAL VASCULAR INTERVENTION (Bilateral) as a surgical intervention.  The patient's history has been reviewed, patient examined, no change in status, stable for surgery.  I have reviewed the patient's chart and labs.  Questions were answered to the patient's satisfaction.     Annamarie Major

## 2018-12-03 ENCOUNTER — Encounter (HOSPITAL_COMMUNITY): Payer: Self-pay | Admitting: Surgery

## 2018-12-08 ENCOUNTER — Other Ambulatory Visit: Payer: Self-pay

## 2018-12-08 ENCOUNTER — Ambulatory Visit (INDEPENDENT_AMBULATORY_CARE_PROVIDER_SITE_OTHER): Payer: Federal, State, Local not specified - PPO | Admitting: Family Medicine

## 2018-12-08 ENCOUNTER — Ambulatory Visit: Payer: Federal, State, Local not specified - PPO | Admitting: Family Medicine

## 2018-12-08 ENCOUNTER — Encounter: Payer: Self-pay | Admitting: Family Medicine

## 2018-12-08 VITALS — BP 160/82 | HR 86 | Temp 98.4°F | Wt 171.0 lb

## 2018-12-08 DIAGNOSIS — Z23 Encounter for immunization: Secondary | ICD-10-CM

## 2018-12-08 DIAGNOSIS — R300 Dysuria: Secondary | ICD-10-CM | POA: Diagnosis not present

## 2018-12-08 LAB — POCT URINALYSIS DIPSTICK
Bilirubin, UA: NEGATIVE
Glucose, UA: NEGATIVE
Ketones, UA: NEGATIVE
Nitrite, UA: POSITIVE
Protein, UA: POSITIVE — AB
Spec Grav, UA: 1.02 (ref 1.010–1.025)
Urobilinogen, UA: 0.2 E.U./dL
pH, UA: 6 (ref 5.0–8.0)

## 2018-12-08 MED ORDER — CEFDINIR 300 MG PO CAPS: 300.0000 mg | ORAL_CAPSULE | Freq: Two times a day (BID) | ORAL | 0 refills | Status: DC

## 2018-12-08 NOTE — Progress Notes (Signed)
Tina Navarro is a 58 y.o. female who presents to [facility]: Primary Care Sports Medicine today for possible urinary tract infection.  Patient has a several day history of urinary frequency urgency and dysuria.  She notes her urine has become a little bit more smelly as well.  She recently had aortic angiography and peripheral vascular intervention on October 6.  She does not think that she had urinary catheterization.  She notes that she has had bruising in her inguinal region following the angiography and stenting.  She denies any worsening bruising or worsening bleeding.  She denies any fevers or chills.  She does not typically get urinary tract infections.     ROS as above:  Exam:  BP (!) 160/82    Pulse 86    Temp 98.4 F (36.9 C) (Oral)    Wt 171 lb (77.6 kg)    LMP  (LMP Unknown)    BMI 28.46 kg/m   Wt Readings from Last 5 Encounters:  12/08/18 171 lb (77.6 kg)  12/02/18 170 lb (77.1 kg)  11/18/18 172 lb (78 kg)  11/10/18 173 lb 8 oz (78.7 kg)  10/09/18 176 lb 11.2 oz (80.2 kg)    Gen: Well NAD HEENT: EOMI,  MMM Lungs: Normal work of breathing. CTABL Heart: RRR no MRG Abd: NABS, Soft. Nondistended, nontender pelvic midline.  No CVA angle tenderness to percussion.  Inguinal region with bruising and palpable nodule at inguinal artery bilaterally.  Nonpulsatile.  Normal hip motion. Exts: Brisk capillary refill, warm and well perfused.   Lab and Radiology Results Results for orders placed or performed in visit on 12/08/18 (from the past 72 hour(s))  POCT Urinalysis Dipstick     Status: Abnormal   Collection Time: 12/08/18 11:11 AM  Result Value Ref Range   Color, UA yellow    Clarity, UA cloudy    Glucose, UA Negative Negative   Bilirubin, UA neg    Ketones, UA neg    Spec Grav, UA 1.020 1.010 - 1.025   Blood, UA large    pH, UA 6.0 5.0 - 8.0   Protein, UA Positive (A)  Negative    Comment: 100mg /dl   Urobilinogen, UA 0.2 0.2 or 1.0 E.U./dL   Nitrite, UA positive    Leukocytes, UA Large (3+) (A) Negative   Appearance     Odor     No results found.    Assessment and Plan: 58 y.o. female with  Urinary tract infection very likely.  Plan for culture and empiric treatment with Omnicef.  This could possibly be related to her stenting and peripheral vascular procedure although she did not have any urinary catheterization to her knowledge.  Recheck if not improving.  Flu vaccine given today prior to discharge.  PDMP not reviewed this encounter. Orders Placed This Encounter  Procedures   Urine Culture   Flu Vaccine QUAD 6+ mos PF IM (Fluarix Quad PF)   POCT Urinalysis Dipstick   Meds ordered this encounter  Medications   cefdinir (OMNICEF) 300 MG capsule    Sig: Take 1 capsule (300 mg total) by mouth 2 (two) times daily.    Dispense:  14 capsule    Refill:  0     Historical information moved to improve visibility of documentation.  Past Medical History:  Diagnosis Date   Emphysematous COPD (HCC) 04/23/2017   GERD (gastroesophageal reflux disease)    Hyperlipidemia    Peripheral arterial disease (HCC)  Thyroid nodule 04/23/2017   Past Surgical History:  Procedure Laterality Date   ABDOMINAL AORTOGRAM N/A 07/16/2017   Procedure: ABDOMINAL AORTOGRAM;  Surgeon: Nada LibmanBrabham, Vance W, MD;  Location: [facility];  Service: Cardiovascular;  Laterality: N/A;   ABDOMINAL AORTOGRAM W/LOWER EXTREMITY Bilateral 12/02/2018   Procedure: ABDOMINAL AORTOGRAM W/LOWER EXTREMITY;  Surgeon: Nada LibmanBrabham, Vance W, MD;  Location: [facility];  Service: Cardiovascular;  Laterality: Bilateral;   AORTIC ARCH ANGIOGRAPHY N/A 12/02/2018   Procedure: AORTIC ARCH ANGIOGRAPHY;  Surgeon: Nada LibmanBrabham, Vance W, MD;  Location: [facility];  Service: Cardiovascular;  Laterality: N/A;   ENDOMETRIAL BIOPSY  2005   FEMORAL ARTERY STENT Bilateral 2008    LOWER EXTREMITY ANGIOGRAPHY Bilateral 07/16/2017   Procedure: Lower Extremity Angiography;  Surgeon: Nada LibmanBrabham, Vance W, MD;  Location: [facility];  Service: Cardiovascular;  Laterality: Bilateral;   NASAL SINUS SURGERY     PERIPHERAL VASCULAR BALLOON ANGIOPLASTY Bilateral 07/16/2017   Procedure: PERIPHERAL VASCULAR BALLOON ANGIOPLASTY;  Surgeon: Nada LibmanBrabham, Vance W, MD;  Location: [facility];  Service: Cardiovascular;  Laterality: Bilateral;  iliacs   PERIPHERAL VASCULAR INTERVENTION Bilateral 12/02/2018   Procedure: PERIPHERAL VASCULAR INTERVENTION;  Surgeon: Nada LibmanBrabham, Vance W, MD;  Location: [facility];  Service: Cardiovascular;  Laterality: Bilateral;   TONSILLECTOMY     TUBAL LIGATION     Social History   Tobacco Use   Smoking status: Current Every Day Smoker    Packs/day: 0.50    Years: 35.00    Pack years: 17.50    Types: Cigarettes    Last attempt to quit: 12/18/2017    Years since quitting: 0.9   Smokeless tobacco: Never Used   Tobacco comment: As per pt - smoking 1/2 pk daily  Substance Use Topics   Alcohol use: No   family history includes Alcohol abuse in her father; Cancer in her maternal grandmother; Cancer - Ovarian in her cousin; Fibroids in her sister; Heart disease in her father; Hypertension in her father and mother; Peripheral vascular disease in her father; Stroke in her maternal grandmother.  Medications: Current Outpatient Medications  Medication Sig Dispense Refill   aspirin 81 MG tablet Take 1 tablet (81 mg total) by mouth daily. (Patient not taking: Reported on 11/27/2018) 90 tablet 3   Aspirin-Salicylamide-Caffeine (BC HEADACHE POWDER PO) Take 1 packet by mouth daily as needed (for headache).      atorvastatin (LIPITOR) 80 MG tablet Take 1 tablet (80 mg total) by mouth at bedtime. 90 tablet 3   cefdinir (OMNICEF) 300 MG capsule Take 1 capsule (300 mg total) by mouth 2 (two) times daily. 14 capsule 0   Cholecalciferol (VITAMIN D3  PO) Take 4,000 mg by mouth at bedtime.     clopidogrel (PLAVIX) 75 MG tablet Take 1 tablet (75 mg total) by mouth daily. 90 tablet 3   diclofenac sodium (VOLTAREN) 1 % GEL Apply 4 g topically 4 (four) times daily. To affected joint. (Patient taking differently: Apply 4 g topically 3 (three) times daily. To affected joint.) 100 g 11   estradiol (ESTRACE VAGINAL) 0.1 MG/GM vaginal cream Place 1 Applicatorful vaginally 3 (three) times a week. (Patient taking differently: Place 1 Applicatorful vaginally 2 (two) times a week. ) 42.5 g 12   gabapentin (NEURONTIN) 300 MG capsule Take 2 capsules (600 mg total) by mouth 3 (three) times daily. (Patient taking differently: Take 600 mg by mouth at bedtime. ) 540 capsule 1   ibuprofen (ADVIL) 600 MG  tablet Take 600 mg by mouth every 6 (six) hours as needed (Shoulder pain).     losartan (COZAAR) 100 MG tablet Take 1 tablet (100 mg total) by mouth daily. (Patient taking differently: Take 100 mg by mouth at bedtime. ) 90 tablet 3   omeprazole (PRILOSEC) 40 MG capsule Take 1 capsule (40 mg total) by mouth 2 (two) times a day. 180 capsule 3   promethazine (PHENERGAN) 12.5 MG tablet TAKE ONE TABLET BY MOUTH EVERY 6 HOURS AS NEEDED FOR NAUSEA OR vomiting (Patient taking differently: Take 12.5 mg by mouth every 4 (four) hours as needed for nausea. ) 30 tablet 2   senna (SENOKOT) 8.6 MG tablet Take 2 tablets (17.2 mg total) by mouth daily. As needed for constipation (Patient taking differently: Take 2 tablets by mouth daily as needed for constipation. ) 90 tablet 1   vitamin B-12 (CYANOCOBALAMIN) 1000 MCG tablet Take 1,000 mcg by mouth daily.      No current facility-administered medications for this visit.    Allergies  Allergen Reactions   Doxycycline Hives, Rash and Other (See Comments)    Pain, photosensitive     Discussed warning signs or symptoms. Please see discharge instructions. Patient expresses understanding.

## 2018-12-08 NOTE — Patient Instructions (Addendum)
Thank you for coming in today. I think you have a UTI.  Take omnicef twice daily for 1 week.  I will send the urine culture results to you when they are back.    Urinary Tract Infection, Adult A urinary tract infection (UTI) is an infection of any part of the urinary tract. The urinary tract includes:  The kidneys.  The ureters.  The bladder.  The urethra. These organs make, store, and get rid of pee (urine) in the body. What are the causes? This is caused by germs (bacteria) in your genital area. These germs grow and cause swelling (inflammation) of your urinary tract. What increases the risk? You are more likely to develop this condition if:  You have a small, thin tube (catheter) to drain pee.  You cannot control when you pee or poop (incontinence).  You are female, and: ? You use these methods to prevent pregnancy: ? A medicine that kills sperm (spermicide). ? A device that blocks sperm (diaphragm). ? You have low levels of a female hormone (estrogen). ? You are pregnant.  You have genes that add to your risk.  You are sexually active.  You take antibiotic medicines.  You have trouble peeing because of: ? A prostate that is bigger than normal, if you are female. ? A blockage in the part of your body that drains pee from the bladder (urethra). ? A kidney stone. ? A nerve condition that affects your bladder (neurogenic bladder). ? Not getting enough to drink. ? Not peeing often enough.  You have other conditions, such as: ? Diabetes. ? A weak disease-fighting system (immune system). ? Sickle cell disease. ? Gout. ? Injury of the spine. What are the signs or symptoms? Symptoms of this condition include:  Needing to pee right away (urgently).  Peeing often.  Peeing small amounts often.  Pain or burning when peeing.  Blood in the pee.  Pee that smells bad or not like normal.  Trouble peeing.  Pee that is cloudy.  Fluid coming from the vagina, if  you are female.  Pain in the belly or lower back. Other symptoms include:  Throwing up (vomiting).  No urge to eat.  Feeling mixed up (confused).  Being tired and grouchy (irritable).  A fever.  Watery poop (diarrhea). How is this treated? This condition may be treated with:  Antibiotic medicine.  Other medicines.  Drinking enough water. Follow these instructions at home:  Medicines  Take over-the-counter and prescription medicines only as told by your doctor.  If you were prescribed an antibiotic medicine, take it as told by your doctor. Do not stop taking it even if you start to feel better. General instructions  Make sure you: ? Pee until your bladder is empty. ? Do not hold pee for a long time. ? Empty your bladder after sex. ? Wipe from front to back after pooping if you are a female. Use each tissue one time when you wipe.  Drink enough fluid to keep your pee pale yellow.  Keep all follow-up visits as told by your doctor. This is important. Contact a doctor if:  You do not get better after 1-2 days.  Your symptoms go away and then come back. Get help right away if:  You have very bad back pain.  You have very bad pain in your lower belly.  You have a fever.  You are sick to your stomach (nauseous).  You are throwing up. Summary  A urinary tract infection (  UTI) is an infection of any part of the urinary tract.  This condition is caused by germs in your genital area.  There are many risk factors for a UTI. These include having a small, thin tube to drain pee and not being able to control when you pee or poop.  Treatment includes antibiotic medicines for germs.  Drink enough fluid to keep your pee pale yellow. This information is not intended to replace advice given to you by your health care provider. Make sure you discuss any questions you have with your health care provider. Document Released: 08/01/2007 Document Revised: 01/30/2018 Document  Reviewed: 08/22/2017 Elsevier Patient Education  2020 [employer].

## 2018-12-10 LAB — URINE CULTURE
MICRO NUMBER:: 979295
SPECIMEN QUALITY:: ADEQUATE

## 2019-01-06 ENCOUNTER — Other Ambulatory Visit: Payer: Self-pay

## 2019-01-06 DIAGNOSIS — I70213 Atherosclerosis of native arteries of extremities with intermittent claudication, bilateral legs: Secondary | ICD-10-CM

## 2019-01-09 ENCOUNTER — Telehealth (HOSPITAL_COMMUNITY): Payer: Self-pay

## 2019-01-09 NOTE — Telephone Encounter (Signed)

## 2019-01-12 ENCOUNTER — Ambulatory Visit (INDEPENDENT_AMBULATORY_CARE_PROVIDER_SITE_OTHER): Payer: Federal, State, Local not specified - PPO | Admitting: Surgery

## 2019-01-12 ENCOUNTER — Encounter: Payer: Self-pay | Admitting: Surgery

## 2019-01-12 ENCOUNTER — Ambulatory Visit (HOSPITAL_COMMUNITY)
Admission: RE | Admit: 2019-01-12 | Discharge: 2019-01-12 | Disposition: A | Discharge status: Home / Self Care | Payer: Federal, State, Local not specified - PPO | Source: Ambulatory Visit | Attending: Surgery | Admitting: Surgery

## 2019-01-12 ENCOUNTER — Other Ambulatory Visit: Payer: Self-pay

## 2019-01-12 VITALS — BP 152/83 | HR 86 | Temp 97.9°F | Resp 20 | Ht 65.0 in | Wt 169.0 lb

## 2019-01-12 DIAGNOSIS — I70213 Atherosclerosis of native arteries of extremities with intermittent claudication, bilateral legs: Secondary | ICD-10-CM

## 2019-01-12 NOTE — Progress Notes (Signed)
[facility] [place]  Patient name: Tina Navarro MRN: [id number] DOB: 01/15/1961 Sex: female   REASON FOR VISIT:    Follow up  [place]:    Tina Navarro is a 58 y.o. female I saw in 2019 for evaluation of bilateral leg pain, right greater than left. The patient states that she has had percutaneous intervention in 2007. At that time both legs were treated. She states that she is starting to have recurrent symptoms which include pain in her right buttock and thigh with activity. These are similar to the symptoms that were alleviated with treatment in 2007. She has transferred into the [facility] health system and therefore new vascular referral was made. She does have a family history of PAD and her father who ultimately required amputation.  On 07-16-2017 she had angioplasty of bilateral common iliac artery stenosis with 41mm balloon.  SHe began having recurrent symptoms of right buttock claudication.  I repeated her angio on 12-02-2018 and treated 50-60% bilateral common iliac in-stent stenosis which I treated with 8x39 VBX stents.  She states that she did have some bleeding and drainage from her right groin for which her husband held pressure.  This is now resolved.  Her claudication symptoms have resolved.  She does have some numbness and tingling in both of her toes.  She also reports left arm weakness when washing her hair.  She does have a left subclavian stenosis, which I evaluated with angio and found a left subclavian occlusion.  She is medically managed for hypertension with an ARB. She takes a statin for hypercholesterolemia. She is a current smoker.  PAST MEDICAL HISTORY:   Past Medical History:  Diagnosis Date  . Emphysematous COPD ([place]) 04/23/2017  . GERD (gastroesophageal reflux disease)   . Hyperlipidemia   . Peripheral arterial disease ([place])   . Thyroid nodule 04/23/2017     FAMILY  HISTORY:   Family History  Problem Relation Age of Onset  . Hypertension Mother   . Hypertension Father   . Heart disease Father   . Peripheral vascular disease Father   . Alcohol abuse Father   . Cancer Maternal Grandmother        BREAST  . Stroke Maternal Grandmother        CEBERAL ATHEROSCLEROSIS  . Cancer - Ovarian Cousin   . Fibroids Sister     SOCIAL HISTORY:   Social History   Tobacco Use  . Smoking status: Current Every Day Smoker    Packs/day: 0.50    Years: 35.00    Pack years: 17.50    Types: Cigarettes    Last attempt to quit: 12/18/2017    Years since quitting: 1.0  . Smokeless tobacco: Never Used  . Tobacco comment: As per pt - smoking 1/2 pk daily  Substance Use Topics  . Alcohol use: No     ALLERGIES:   Allergies  Allergen Reactions  . Doxycycline Hives, Rash and Other (See Comments)    Pain, photosensitive     CURRENT MEDICATIONS:   Current Outpatient Medications  Medication Sig Dispense Refill  . aspirin 81 MG tablet Take 1 tablet (81 mg total) by mouth daily. 90 tablet 3  . Aspirin-Salicylamide-Caffeine (BC HEADACHE POWDER PO) Take 1 packet by mouth daily as needed (for headache).     Marland Kitchen atorvastatin (LIPITOR) 80 MG tablet Take 1 tablet (80 mg total) by mouth at bedtime. 90 tablet 3  . cefdinir (OMNICEF) 300 MG capsule Take 1  capsule (300 mg total) by mouth 2 (two) times daily. 14 capsule 0  . Cholecalciferol (VITAMIN D3 PO) Take 4,000 mg by mouth at bedtime.    . clopidogrel (PLAVIX) 75 MG tablet Take 1 tablet (75 mg total) by mouth daily. 90 tablet 3  . diclofenac sodium (VOLTAREN) 1 % GEL Apply 4 g topically 4 (four) times daily. To affected joint. (Patient taking differently: Apply 4 g topically 3 (three) times daily. To affected joint.) 100 g 11  . estradiol (ESTRACE VAGINAL) 0.1 MG/GM vaginal cream Place 1 Applicatorful vaginally 3 (three) times a week. (Patient taking differently: Place 1 Applicatorful vaginally 2 (two) times a week. )  42.5 g 12  . gabapentin (NEURONTIN) 300 MG capsule Take 2 capsules (600 mg total) by mouth 3 (three) times daily. (Patient taking differently: Take 600 mg by mouth at bedtime. ) 540 capsule 1  . ibuprofen (ADVIL) 600 MG tablet Take 600 mg by mouth every 6 (six) hours as needed (Shoulder pain).    Marland Kitchen losartan (COZAAR) 100 MG tablet Take 1 tablet (100 mg total) by mouth daily. (Patient taking differently: Take 100 mg by mouth at bedtime. ) 90 tablet 3  . omeprazole (PRILOSEC) 40 MG capsule Take 1 capsule (40 mg total) by mouth 2 (two) times a day. 180 capsule 3  . promethazine (PHENERGAN) 12.5 MG tablet TAKE ONE TABLET BY MOUTH EVERY 6 HOURS AS NEEDED FOR NAUSEA OR vomiting (Patient taking differently: Take 12.5 mg by mouth every 4 (four) hours as needed for nausea. ) 30 tablet 2  . senna (SENOKOT) 8.6 MG tablet Take 2 tablets (17.2 mg total) by mouth daily. As needed for constipation (Patient taking differently: Take 2 tablets by mouth daily as needed for constipation. ) 90 tablet 1  . vitamin B-12 (CYANOCOBALAMIN) 1000 MCG tablet Take 1,000 mcg by mouth daily.      No current facility-administered medications for this visit.     REVIEW OF SYSTEMS:   [X]  denotes positive finding, [ ]  denotes negative finding Cardiac  Comments:  Chest pain or chest pressure:    Shortness of breath upon exertion:    Short of breath when lying flat:    Irregular heart rhythm:        Vascular    Pain in calf, thigh, or hip brought on by ambulation:    Pain in feet at night that wakes you up from your sleep:     Blood clot in your veins:    Leg swelling:         Pulmonary    Oxygen at home:    Productive cough:     Wheezing:         Neurologic    Sudden weakness in arms or legs:     Sudden numbness in arms or legs:     Sudden onset of difficulty speaking or slurred speech:    Temporary loss of vision in one eye:     Problems with dizziness:         Gastrointestinal    Blood in stool:     Vomited  blood:         Genitourinary    Burning when urinating:     Blood in urine:        Psychiatric    Major depression:         Hematologic    Bleeding problems:    Problems with blood clotting too easily:        Skin  Rashes or ulcers:        Constitutional    Fever or chills:      PHYSICAL EXAM:   There were no vitals filed for this visit.  GENERAL: The patient is a well-nourished female, in no acute distress. The vital signs are documented above. CARDIAC: There is a regular rate and rhythm.  VASCULAR: Palpable dorsalis pedis pulse bilaterally.  Bilateral common femoral arteries are palpable with no evidence of residual hematoma or pseudoaneurysm PULMONARY: Non-labored respirations.  MUSCULOSKELETAL: There are no major deformities or cyanosis. NEUROLOGIC: No focal weakness or paresthesias are detected. SKIN: There are no ulcers or rashes noted. PSYCHIATRIC: The patient has a normal affect.  STUDIES:   I have ordered and reviewed the following duplex studies:  Patent bilateral common iliac artery stents with no evidence of stenosis.   MEDICAL ISSUES:   Claudication: The patient symptoms have resolved.  By ultrasound, her stents are widely patent.  I would like for her to continue aspirin and Plavix therapy.  She will follow-up in 6 months with repeat duplex and ABIs.  Left subclavian occlusion: Currently, she is essentially asymptomatic although she does have times when her left arm gives out.  She does not report any syncopal episodes.  We discussed that she should only monitor her blood pressure in her right arm.  If her symptoms deteriorate we can consider carotid subclavian bypass/transposition, however her symptoms are not severe enough at this time to warrant it.  Hypercholesterolemia: Continue statin therapy.  Carotid:  40-59% in 2019, I will repeat her duplex in 6 months  Charlena CrossWells , IV, MD, FACS [facility] Tel [phone]  [phone] Pager [phone]

## 2019-01-13 ENCOUNTER — Other Ambulatory Visit: Payer: Self-pay

## 2019-01-13 DIAGNOSIS — I70213 Atherosclerosis of native arteries of extremities with intermittent claudication, bilateral legs: Secondary | ICD-10-CM

## 2019-01-13 DIAGNOSIS — I6523 Occlusion and stenosis of bilateral carotid arteries: Secondary | ICD-10-CM

## 2019-01-29 DIAGNOSIS — K080 Exfoliation of teeth due to systemic causes: Secondary | ICD-10-CM | POA: Diagnosis not present

## 2019-02-13 ENCOUNTER — Other Ambulatory Visit: Payer: Self-pay | Admitting: Osteopathic Medicine

## 2019-02-13 DIAGNOSIS — K588 Other irritable bowel syndrome: Secondary | ICD-10-CM

## 2019-06-12 ENCOUNTER — Other Ambulatory Visit: Payer: Self-pay | Admitting: Osteopathic Medicine

## 2019-06-12 DIAGNOSIS — I739 Peripheral vascular disease, unspecified: Secondary | ICD-10-CM

## 2019-06-26 ENCOUNTER — Other Ambulatory Visit: Payer: Self-pay | Admitting: Osteopathic Medicine

## 2019-06-26 DIAGNOSIS — I739 Peripheral vascular disease, unspecified: Secondary | ICD-10-CM

## 2019-07-17 ENCOUNTER — Other Ambulatory Visit: Payer: Self-pay | Admitting: Osteopathic Medicine

## 2019-07-17 DIAGNOSIS — K219 Gastro-esophageal reflux disease without esophagitis: Secondary | ICD-10-CM

## 2019-08-07 ENCOUNTER — Other Ambulatory Visit: Payer: Self-pay | Admitting: Osteopathic Medicine

## 2019-08-07 DIAGNOSIS — K588 Other irritable bowel syndrome: Secondary | ICD-10-CM

## 2019-08-26 ENCOUNTER — Ambulatory Visit (INDEPENDENT_AMBULATORY_CARE_PROVIDER_SITE_OTHER): Payer: Federal, State, Local not specified - PPO | Admitting: Osteopathic Medicine

## 2019-08-26 ENCOUNTER — Encounter: Payer: Self-pay | Admitting: Osteopathic Medicine

## 2019-08-26 ENCOUNTER — Other Ambulatory Visit: Payer: Self-pay

## 2019-08-26 VITALS — BP 148/83 | HR 85 | Temp 98.0°F | Ht 65.0 in | Wt 175.0 lb

## 2019-08-26 DIAGNOSIS — I739 Peripheral vascular disease, unspecified: Secondary | ICD-10-CM

## 2019-08-26 DIAGNOSIS — E041 Nontoxic single thyroid nodule: Secondary | ICD-10-CM | POA: Diagnosis not present

## 2019-08-26 DIAGNOSIS — I10 Essential (primary) hypertension: Secondary | ICD-10-CM

## 2019-08-26 DIAGNOSIS — K219 Gastro-esophageal reflux disease without esophagitis: Secondary | ICD-10-CM

## 2019-08-26 DIAGNOSIS — L301 Dyshidrosis [pompholyx]: Secondary | ICD-10-CM

## 2019-08-26 DIAGNOSIS — E785 Hyperlipidemia, unspecified: Secondary | ICD-10-CM | POA: Diagnosis not present

## 2019-08-26 LAB — COMPLETE METABOLIC PANEL WITH GFR
AG Ratio: 1.8 (calc) (ref 1.0–2.5)
ALT: 13 U/L (ref 6–29)
AST: 14 U/L (ref 10–35)
Albumin: 4.2 g/dL (ref 3.6–5.1)
Alkaline phosphatase (APISO): 99 U/L (ref 37–153)
BUN: 20 mg/dL (ref 7–25)
CO2: 29 mmol/L (ref 20–32)
Calcium: 9.7 mg/dL (ref 8.6–10.4)
Chloride: 105 mmol/L (ref 98–110)
Creat: 0.91 mg/dL (ref 0.50–1.05)
GFR, Est African American: 81 mL/min/{1.73_m2} (ref >=60)
GFR, Est Non African American: 70 mL/min/{1.73_m2} (ref >=60)
Globulin: 2.3 g/dL (calc) (ref 1.9–3.7)
Glucose, Bld: 99 mg/dL (ref 65–139)
Potassium: 5 mmol/L (ref 3.5–5.3)
Sodium: 140 mmol/L (ref 135–146)
Total Bilirubin: 0.2 mg/dL (ref 0.2–1.2)
Total Protein: 6.5 g/dL (ref 6.1–8.1)

## 2019-08-26 LAB — CBC
HCT: 42.7 % (ref 35.0–45.0)
Hemoglobin: 14.1 g/dL (ref 11.7–15.5)
MCH: 28.8 pg (ref 27.0–33.0)
MCHC: 33 g/dL (ref 32.0–36.0)
MCV: 87.1 fL (ref 80.0–100.0)
MPV: 8.7 fL (ref 7.5–12.5)
Platelets: 332 10*3/uL (ref 140–400)
RBC: 4.9 10*6/uL (ref 3.80–5.10)
RDW: 13.1 % (ref 11.0–15.0)
WBC: 6.4 10*3/uL (ref 3.8–10.8)

## 2019-08-26 LAB — LIPID PANEL
Cholesterol: 188 mg/dL (ref <200)
HDL: 40 mg/dL — ABNORMAL LOW (ref >=50)
LDL Cholesterol (Calc): 103 mg/dL (calc) — ABNORMAL HIGH
Non-HDL Cholesterol (Calc): 148 mg/dL (calc) — ABNORMAL HIGH (ref <130)
Total CHOL/HDL Ratio: 4.7 (calc) (ref <5.0)
Triglycerides: 340 mg/dL — ABNORMAL HIGH (ref <150)

## 2019-08-26 LAB — TSH: TSH: 1.95 mIU/L (ref 0.40–4.50)

## 2019-08-26 MED ORDER — ATORVASTATIN CALCIUM 40 MG PO TABS: 40.0000 mg | ORAL_TABLET | Freq: Every day | ORAL | 3 refills | Status: DC

## 2019-08-26 MED ORDER — CLOPIDOGREL BISULFATE 75 MG PO TABS: 75.0000 mg | ORAL_TABLET | Freq: Every day | ORAL | 3 refills | Status: DC

## 2019-08-26 MED ORDER — ICOSAPENT ETHYL 1 G PO CAPS: 2.0000 g | ORAL_CAPSULE | Freq: Two times a day (BID) | ORAL | 3 refills | Status: DC

## 2019-08-26 MED ORDER — LOSARTAN POTASSIUM 100 MG PO TABS: 100.0000 mg | ORAL_TABLET | Freq: Every day | ORAL | 3 refills | Status: DC

## 2019-08-26 MED ORDER — CLOBETASOL PROPIONATE 0.05 % EX OINT: 1.0000 "application " | TOPICAL_OINTMENT | Freq: Two times a day (BID) | CUTANEOUS | 1 refills | Status: DC

## 2019-08-26 MED ORDER — OMEPRAZOLE 40 MG PO CPDR: 40.0000 mg | DELAYED_RELEASE_CAPSULE | Freq: Every day | ORAL | 3 refills | Status: DC

## 2019-08-26 NOTE — Progress Notes (Signed)
Tina Navarro is a 59 y.o. female who presents to  [facility]  today, 08/26/19, seeking care for the following:   New: Rash x3 months on hand. Will get wet when she puts band-aid on it then will dry up and crack if she leaves it open.   Old/stable: needs refills on other Rx     ASSESSMENT & PLAN with other pertinent findings:  The primary encounter diagnosis was Dyshidrotic eczema. Diagnoses of Gastroesophageal reflux disease, Essential hypertension, Thyroid nodule, Dyslipidemia, and Peripheral vascular disease (HCC) were also pertinent to this visit.     No results found for this or any previous visit (from the past 24 hour(s)).  There are no Patient Instructions on file for this visit.  Orders Placed This Encounter  Procedures  . CBC  . COMPLETE METABOLIC PANEL WITH GFR  . Lipid panel  . TSH    Meds ordered this encounter  Medications  . clobetasol ointment (TEMOVATE) 0.05 %    Sig: Apply 1 application topically 2 (two) times daily. To affected area(s) as needed, max 2 weeks to avoid whitening/thinning skin    Dispense:  15 g    Refill:  1  . clopidogrel (PLAVIX) 75 MG tablet    Sig: Take 1 tablet (75 mg total) by mouth daily.    Dispense:  90 tablet    Refill:  3  . losartan (COZAAR) 100 MG tablet    Sig: Take 1 tablet (100 mg total) by mouth daily.    Dispense:  90 tablet    Refill:  3  . omeprazole (PRILOSEC) 40 MG capsule    Sig: Take 1 capsule (40 mg total) by mouth daily.    Dispense:  180 capsule    Refill:  3  . atorvastatin (LIPITOR) 40 MG tablet    Sig: Take 1 tablet (40 mg total) by mouth at bedtime.    Dispense:  90 tablet    Refill:  3       Follow-up instructions: Return in about 6 weeks (around 10/07/2019) for ANNUAL (get labs prior to visit, orders are in).                                         BP (!) 148/83 (BP Location: Right Arm, Patient  Position: Sitting)   Pulse 85   Temp 98 F (36.7 C)   Ht 5\' 5"  (1.651 m)   Wt 175 lb (79.4 kg)   LMP  (LMP Unknown)   SpO2 98%   BMI 29.12 kg/m   Current Meds  Medication Sig  . Aspirin-Salicylamide-Caffeine (BC HEADACHE POWDER PO) Take 1 packet by mouth daily as needed (for headache).   . Cholecalciferol (VITAMIN D3 PO) Take 4,000 mg by mouth at bedtime.  . clopidogrel (PLAVIX) 75 MG tablet Take 1 tablet (75 mg total) by mouth daily.  estradiol (ESTRACE VAGINAL) 0.1 MG/GM vaginal cream Place 1 Applicatorful vaginally 3 (three) times a week. (Patient taking differently: Place 1 Applicatorful vaginally 2 (two) times a week. )  . ibuprofen (ADVIL) 600 MG tablet Take 600 mg by mouth every 6 (six) hours as needed (Shoulder pain).  Marland Kitchen losartan (COZAAR) 100 MG tablet Take 1 tablet (100 mg total) by mouth daily.  Marland Kitchen omeprazole (PRILOSEC) 40 MG capsule Take 1 capsule (40 mg total) by mouth daily.  . promethazine (  PHENERGAN) 12.5 MG tablet Take 1 tablet (12.5 mg total) by mouth every 4 (four) hours as needed for nausea.  Marland Kitchen senna (SENOKOT) 8.6 MG tablet Take 2 tablets (17.2 mg total) by mouth daily. As needed for constipation (Patient taking differently: Take 2 tablets by mouth daily as needed for constipation. )  . vitamin B-12 (CYANOCOBALAMIN) 1000 MCG tablet Take 1,000 mcg by mouth daily.   . [DISCONTINUED] aspirin 81 MG tablet Take 1 tablet (81 mg total) by mouth daily.  . [DISCONTINUED] cefdinir (OMNICEF) 300 MG capsule Take 1 capsule (300 mg total) by mouth 2 (two) times daily.  . [DISCONTINUED] clopidogrel (PLAVIX) 75 MG tablet Take 1 tablet (75 mg total) by mouth daily.  . [DISCONTINUED] losartan (COZAAR) 100 MG tablet Take 1 tablet (100 mg total) by mouth daily.  . [DISCONTINUED] omeprazole (PRILOSEC) 40 MG capsule Take 1 capsule (40 mg total) by mouth 2 (two) times a day.    No results found for this or any previous visit (from the past 72 hour(s)).  No results found.     All  questions at time of visit were answered - patient instructed to contact office with any additional concerns or updates.  ER/RTC precautions were reviewed with the patient as applicable.   Please note: voice recognition software was used to produce this document, and typos may escape review. Please contact Dr. Lyn Hollingshead for any needed clarifications.

## 2019-08-26 NOTE — Addendum Note (Signed)
Addended by: Deirdre Pippins on: 08/26/2019 06:03 PM   Modules accepted: Orders

## 2019-09-11 ENCOUNTER — Other Ambulatory Visit: Payer: Self-pay

## 2019-09-11 DIAGNOSIS — I70213 Atherosclerosis of native arteries of extremities with intermittent claudication, bilateral legs: Secondary | ICD-10-CM

## 2019-09-11 DIAGNOSIS — I6523 Occlusion and stenosis of bilateral carotid arteries: Secondary | ICD-10-CM

## 2019-09-11 NOTE — Progress Notes (Signed)
Opened in Error.

## 2019-09-21 ENCOUNTER — Ambulatory Visit (INDEPENDENT_AMBULATORY_CARE_PROVIDER_SITE_OTHER)
Admission: RE | Admit: 2019-09-21 | Discharge: 2019-09-21 | Disposition: A | Discharge status: Home / Self Care | Payer: Federal, State, Local not specified - PPO | Source: Ambulatory Visit | Attending: Surgery | Admitting: Surgery

## 2019-09-21 ENCOUNTER — Other Ambulatory Visit: Payer: Self-pay

## 2019-09-21 ENCOUNTER — Ambulatory Visit (INDEPENDENT_AMBULATORY_CARE_PROVIDER_SITE_OTHER): Payer: Federal, State, Local not specified - PPO | Admitting: Physician Assistant

## 2019-09-21 ENCOUNTER — Ambulatory Visit (HOSPITAL_COMMUNITY)
Admission: RE | Admit: 2019-09-21 | Discharge: 2019-09-21 | Disposition: A | Discharge status: Home / Self Care | Payer: Federal, State, Local not specified - PPO | Source: Ambulatory Visit | Attending: Surgery | Admitting: Surgery

## 2019-09-21 VITALS — BP 164/80 | HR 85 | Temp 96.8°F | Resp 20 | Ht 65.0 in | Wt 176.2 lb

## 2019-09-21 DIAGNOSIS — I6523 Occlusion and stenosis of bilateral carotid arteries: Secondary | ICD-10-CM | POA: Diagnosis not present

## 2019-09-21 DIAGNOSIS — I771 Stricture of artery: Secondary | ICD-10-CM

## 2019-09-21 DIAGNOSIS — I779 Disorder of arteries and arterioles, unspecified: Secondary | ICD-10-CM

## 2019-09-21 DIAGNOSIS — I70213 Atherosclerosis of native arteries of extremities with intermittent claudication, bilateral legs: Secondary | ICD-10-CM | POA: Insufficient documentation

## 2019-09-21 NOTE — Progress Notes (Signed)
HISTORY AND PHYSICAL     CC:  follow up. Requesting Provider:  Sunnie Nielsen, DO  HPI: This is a 59 y.o. female who is here today for follow up and is pt of Dr. Myra Gianotti.  She was last seen on 01/12/2019.    Pt has had percutaneous intervention in 2007. At that time both legs were treated. She states that she is starting to have recurrent symptoms which include pain in her right buttock and thigh with activity. These are similar to the symptoms that were alleviated with treatment in 2007. She has transferred into the [facility] health system and therefore new vascular referral was made. She does have a family history of PAD and her father who ultimately required amputation.On 07-16-2017 she had angioplasty of bilateral common iliac artery stenosis with 18mm balloon.  Dr. Myra Gianotti repeated her angiogram on 12/02/2018 and treated a 50-60% bilateral CIA in stent stenosis.   When she was seen in November, these sx had resolved.  She did continue to have some numbness and tingling in her toes.  Her carotid duplex did reveal 40-59% in 2019 and he ordered to have this repeated at the next visit.  She was continued on her plavix/asa/statin.  She also has a left subclavian artery occlusion.  She was asymptomatic although her arm wound sometimes give out.  She did not have any syncopal sx.  He recommended she only have her BP checked in the right arm.  If her sx worsened, he would consider carotid subclavian bypass/transposition.    The pt returns today for follow up.  She states that she has not had any claudication sx or non healing wounds.  She continues to take her plavix/asa/statin.  She states that her left arm gets tired and falls asleep when she is crocheting and that is really the only new symptom she has noticed.    The pt is on a statin for cholesterol management.    The pt is on an aspirin.    Other AC:  none The pt is on ARB for hypertension.  The pt does not have diabetes. Tobacco hx:   Former-she quit in January  Pt does not have family hx of AAA.    Past Medical History:  Diagnosis Date  . Emphysematous COPD (HCC) 04/23/2017  . GERD (gastroesophageal reflux disease)   . Hyperlipidemia   . Peripheral arterial disease (HCC)   . Thyroid nodule 04/23/2017    Past Surgical History:  Procedure Laterality Date  . ABDOMINAL AORTOGRAM N/A 07/16/2017   Procedure: ABDOMINAL AORTOGRAM;  Surgeon: Nada Libman, MD;  Location: [facility];  Service: Cardiovascular;  Laterality: N/A;  . ABDOMINAL AORTOGRAM W/LOWER EXTREMITY Bilateral 12/02/2018   Procedure: ABDOMINAL AORTOGRAM W/LOWER EXTREMITY;  Surgeon: Nada Libman, MD;  Location: [facility];  Service: Cardiovascular;  Laterality: Bilateral;  . AORTIC ARCH ANGIOGRAPHY N/A 12/02/2018   Procedure: AORTIC ARCH ANGIOGRAPHY;  Surgeon: Nada Libman, MD;  Location: [facility];  Service: Cardiovascular;  Laterality: N/A;  . ENDOMETRIAL BIOPSY  2005  . FEMORAL ARTERY STENT Bilateral 2008  . LOWER EXTREMITY ANGIOGRAPHY Bilateral 07/16/2017   Procedure: Lower Extremity Angiography;  Surgeon: Nada Libman, MD;  Location: [facility] INVASIVE CV LAB;  Service: Cardiovascular;  Laterality: Bilateral;  . NASAL SINUS SURGERY    . PERIPHERAL VASCULAR BALLOON ANGIOPLASTY Bilateral 07/16/2017   Procedure: PERIPHERAL VASCULAR BALLOON ANGIOPLASTY;  Surgeon: Nada Libman, MD;  Location: [facility];  Service: Cardiovascular;  Laterality: Bilateral;  iliacs  . PERIPHERAL VASCULAR INTERVENTION Bilateral 12/02/2018   Procedure: PERIPHERAL VASCULAR INTERVENTION;  Surgeon: Nada Libman, MD;  Location: [facility];  Service: Cardiovascular;  Laterality: Bilateral;  . TONSILLECTOMY    . TUBAL LIGATION      Allergies  Allergen Reactions  . Doxycycline Hives, Rash and Other (See Comments)    Pain, photosensitive    Current Outpatient Medications  Medication Sig Dispense Refill  .  Aspirin-Salicylamide-Caffeine (BC HEADACHE POWDER PO) Take 1 packet by mouth daily as needed (for headache).     Marland Kitchen atorvastatin (LIPITOR) 40 MG tablet Take 1 tablet (40 mg total) by mouth at bedtime. 90 tablet 3  . Cholecalciferol (VITAMIN D3 PO) Take 4,000 mg by mouth at bedtime.    . clobetasol ointment (TEMOVATE) 0.05 % Apply 1 application topically 2 (two) times daily. To affected area(s) as needed, max 2 weeks to avoid whitening/thinning skin 15 g 1  . clopidogrel (PLAVIX) 75 MG tablet Take 1 tablet (75 mg total) by mouth daily. 90 tablet 3  . diclofenac sodium (VOLTAREN) 1 % GEL Apply 4 g topically 4 (four) times daily. To affected joint. (Patient not taking: Reported on 08/26/2019) 100 g 11  . estradiol (ESTRACE VAGINAL) 0.1 MG/GM vaginal cream Place 1 Applicatorful vaginally 3 (three) times a week. (Patient taking differently: Place 1 Applicatorful vaginally 2 (two) times a week. ) 42.5 g 12  . gabapentin (NEURONTIN) 300 MG capsule Take 2 capsules (600 mg total) by mouth 3 (three) times daily. (Patient taking differently: Take 600 mg by mouth at bedtime. ) 540 capsule 1  . ibuprofen (ADVIL) 600 MG tablet Take 600 mg by mouth every 6 (six) hours as needed (Shoulder pain).    Marland Kitchen icosapent Ethyl (VASCEPA) 1 g capsule Take 2 capsules (2 g total) by mouth 2 (two) times daily. 360 capsule 3  . losartan (COZAAR) 100 MG tablet Take 1 tablet (100 mg total) by mouth daily. 90 tablet 3  . omeprazole (PRILOSEC) 40 MG capsule Take 1 capsule (40 mg total) by mouth daily. 180 capsule 3  . promethazine (PHENERGAN) 12.5 MG tablet Take 1 tablet (12.5 mg total) by mouth every 4 (four) hours as needed for nausea. 30 tablet 1  . senna (SENOKOT) 8.6 MG tablet Take 2 tablets (17.2 mg total) by mouth daily. As needed for constipation (Patient taking differently: Take 2 tablets by mouth daily as needed for constipation. ) 90 tablet 1  . vitamin B-12 (CYANOCOBALAMIN) 1000 MCG tablet Take 1,000 mcg by mouth daily.       No current facility-administered medications for this visit.    Family History  Problem Relation Age of Onset  . Hypertension Mother   . Hypertension Father   . Heart disease Father   . Peripheral vascular disease Father   . Alcohol abuse Father   . Cancer Maternal Grandmother        BREAST  . Stroke Maternal Grandmother        CEBERAL ATHEROSCLEROSIS  . Cancer - Ovarian Cousin   . Fibroids Sister     Social History   Socioeconomic History  . Marital status: Married    Spouse name: Not on file  . Number of children: Not on file  . Years of education: Not on file  . Highest education level: Not on file  Occupational History  . Not on file  Tobacco Use  . Smoking status: Current Every Day Smoker    Packs/day: 0.50  Years: 35.00    Pack years: 17.50    Types: Cigarettes    Last attempt to quit: 12/18/2017    Years since quitting: 1.7  . Smokeless tobacco: Never Used  . Tobacco comment: As per pt - smoking 1/2 pk daily  Vaping Use  . Vaping Use: Never used  Substance and Sexual Activity  . Alcohol use: No  . Drug use: No  . Sexual activity: Yes  Other Topics Concern  . Not on file  Social History Narrative  . Not on file   Social Determinants of Health   Financial Resource Strain:   . Difficulty of Paying Living Expenses:   Food Insecurity:   . Worried About Programme researcher, broadcasting/film/videounning Out of Food in the Last Year:   . Baristaan Out of Food in the Last Year:   Transportation Needs:   . Freight forwarderLack of Transportation (Medical):   Marland Kitchen. Lack of Transportation (Non-Medical):   Physical Activity:   . Days of Exercise per Week:   . Minutes of Exercise per Session:   Stress:   . Feeling of Stress :   Social Connections:   . Frequency of Communication with Friends and Family:   . Frequency of Social Gatherings with Friends and Family:   . Attends Religious Services:   . Active Member of Clubs or Organizations:   . Attends BankerClub or Organization Meetings:   Marland Kitchen. Marital Status:   Intimate  Partner Violence:   . Fear of Current or Ex-Partner:   . Emotionally Abused:   Marland Kitchen. Physically Abused:   . Sexually Abused:      REVIEW OF SYSTEMS:   [X]  denotes positive finding, [ ]  denotes negative finding Cardiac  Comments:  Chest pain or chest pressure:    Shortness of breath upon exertion:    Short of breath when lying flat:    Irregular heart rhythm:        Vascular    Pain in calf, thigh, or hip brought on by ambulation:    Pain in feet at night that wakes you up from your sleep:     Blood clot in your veins:    Leg swelling:         Pulmonary    Oxygen at home:    Productive cough:     Wheezing:         Neurologic    Sudden weakness in arms or legs:     Sudden numbness in arms or legs:     Sudden onset of difficulty speaking or slurred speech:    Temporary loss of vision in one eye:     Problems with dizziness:         Gastrointestinal    Blood in stool:     Vomited blood:         Genitourinary    Burning when urinating:     Blood in urine:        Psychiatric    Major depression:         Hematologic    Bleeding problems:    Problems with blood clotting too easily:        Skin    Rashes or ulcers:        Constitutional    Fever or chills:      PHYSICAL EXAMINATION:  Today's Vitals   09/21/19 1031 09/21/19 1035  BP: (!) 178/83 (!) 164/80  Pulse: 85   Resp: 20   Temp: (!) 96.8 F (36 C)   TempSrc:  Temporal   SpO2: 99%   Weight: 176 lb 3.2 oz (79.9 kg)   Height:  (1.651 m)   PainSc: 8    PainLoc: Leg    Body mass index is 29.32 kg/m.   General:  WDWN in NAD; vital signs documented above Gait: Normal HENT: WNL, normocephalic Pulmonary: normal non-labored breathing , without Rales, rhonchi,  wheezing Cardiac: regular HR, without Murmur; without carotid bruits Abdomen: soft, NT, no masses Skin: without rashes Vascular Exam/Pulses:  Right Left  Radial 1+ weak  absent  Ulnar 2+ (normal) absent  Femoral Unable to palpate Unable  to palpate  Popliteal Unable to palpate Unable to palpate  DP 2+ (normal) 2+ (normal)  PT Unable to palpate Unable to palpate   Extremities: without ischemic changes, without Gangrene , without cellulitis; without open wounds;  Musculoskeletal: no muscle wasting or atrophy  Neurologic: A&O X 3;  No focal weakness or paresthesias are detected Psychiatric:  The pt has Normal affect.   Non-Invasive Vascular Imaging:   ABI's/TBI's on 09/21/2019: Right:  1.06/0.74 - great toe pressure:  133 Left:  0.97/0.82 - great toe pressure:  146   Arterial duplex on 09/21/2019: Right Stent(s):common iliac  +---------------+---++++  Prox to Stent 90 +---------------+---++++  Proximal Stent 158 +---------------+---++++  Mid Stent   166 +---------------+---++++  Distal Stent  240 +---------------+---++++  Distal to Stent160 +---------------+---++++   Left Stent(s):common iliac  +---------------+---++++  Prox to Stent 90  +---------------+---++++  Proximal Stent 178 +---------------+---++++  Mid Stent   127 +---------------+---++++  Distal Stent  115 +---------------+---++++  Distal to Stent138 +---------------+---++++   Summary:  Stenosis: +------------------+-----------+  Location     Stent     +------------------+-----------+  Right Common Iliacno stenosis  +------------------+-----------+  Left Common Iliac no stenosis  +------------------+-----------+    Non-Invasive Vascular Imaging:   Carotid Duplex on 09/22/2019: Right:  40-59% ICA stenosis Left:  1-39% ICA stenosis Vertebrals: Right vertebral artery demonstrates antegrade flow. Left  vertebral artery was not visualized.  Subclavians: Left subclavian artery was occluded. Normal flow hemodynamics were seen in the right subclavian artery.  Previous ABI's/TBI's on 10/09/2018: Right:  0.88/0.64 - great toe pressure:  86 Left:  1.0/0.83 - great toe pressure:  111  Previous  arterial duplex 01/12/2019: Right Stent(s):  +-------------------+--------+--------+--------+--------+  common iliac arteryPSV cm/sStenosisWaveformComments  +-------------------+--------+--------+--------+--------+  Prox to Stent   82       biphasic      +-------------------+--------+--------+--------+--------+  Proximal Stent   122       biphasic      +-------------------+--------+--------+--------+--------+  Mid Stent     98       biphasic      +-------------------+--------+--------+--------+--------+  Distal Stent    91       biphasic      +-------------------+--------+--------+--------+--------+  Distal to Stent  120       biphasic      +-------------------+--------+--------+--------+--------+     Left Stent(s):  +-------------------+--------+--------+--------+--------+  common iliac arteryPSV cm/sStenosisWaveformComments  +-------------------+--------+--------+--------+--------+  Prox to Stent   90       biphasic      +-------------------+--------+--------+--------+--------+  Proximal Stent   114       biphasic      +-------------------+--------+--------+--------+--------+  Mid Stent     97       biphasic      +-------------------+--------+--------+--------+--------+  Distal Stent    83       biphasic      +-------------------+--------+--------+--------+--------+  Distal to Stent  159  biphasic      +-------------------+--------+--------+--------+--------+   Summary:  Stenosis:  Patent bilateral common iliac artery stents with no evidence of stenosis   Previous Carotid duplex on 12/16/2017: Right: 40-59% ICA stenosis Left:   40-59% ICA stenosis Known subclavian artery occlusive disease   ASSESSMENT/PLAN:: 59 y.o. female here for follow up for:  PAD -pt  with normal ABI's and there is not stenosis within her bilateral CIA stents.  She has no further claudication.   She has palpable DP pulses bilaterally.  -continue plavix/asa/statin -pt will f/u in one year with ABI/Aorto-iliac duplex bilaterally.  -she will call sooner if she develops new sx or non healing wounds.   Carotid stenosis -pt right ICA stenosis unchanged.  The left was a decrease to the 1-39% category.   -pt will f/u in one year with carotid duplex  Left subclavian artery occlusion -pt does have some fatigue of the left arm.  Dr. Myra Gianotti felt that if her sx worsen, we could consider a carotid to subclavian artery bypass/transposition.    Smoking cessation -pt quit smoking in January.  Her husband has also quit smoking.  Praised her and encouraged her to continue with not smoking and talked about reasons that this is important.     Doreatha Massed, [facility] [phone]  Clinic MD:   Darrick Penna on call MD

## 2019-09-23 ENCOUNTER — Other Ambulatory Visit: Payer: Self-pay | Admitting: *Deleted

## 2019-10-05 ENCOUNTER — Other Ambulatory Visit: Payer: Self-pay | Admitting: Osteopathic Medicine

## 2019-10-05 DIAGNOSIS — N952 Postmenopausal atrophic vaginitis: Secondary | ICD-10-CM

## 2019-10-05 DIAGNOSIS — K588 Other irritable bowel syndrome: Secondary | ICD-10-CM

## 2019-10-14 ENCOUNTER — Encounter: Payer: Federal, State, Local not specified - PPO | Admitting: Osteopathic Medicine

## 2019-11-08 ENCOUNTER — Other Ambulatory Visit: Payer: Self-pay | Admitting: Osteopathic Medicine

## 2019-11-11 IMAGING — DX DG SHOULDER 2+V*L*
3 series · 3 of 3 positions shown · non-contrast
Comparison: None.

CLINICAL DATA: Left shoulder pain

EXAM:
LEFT SHOULDER - 2+ VIEW

[shoulder grashey]
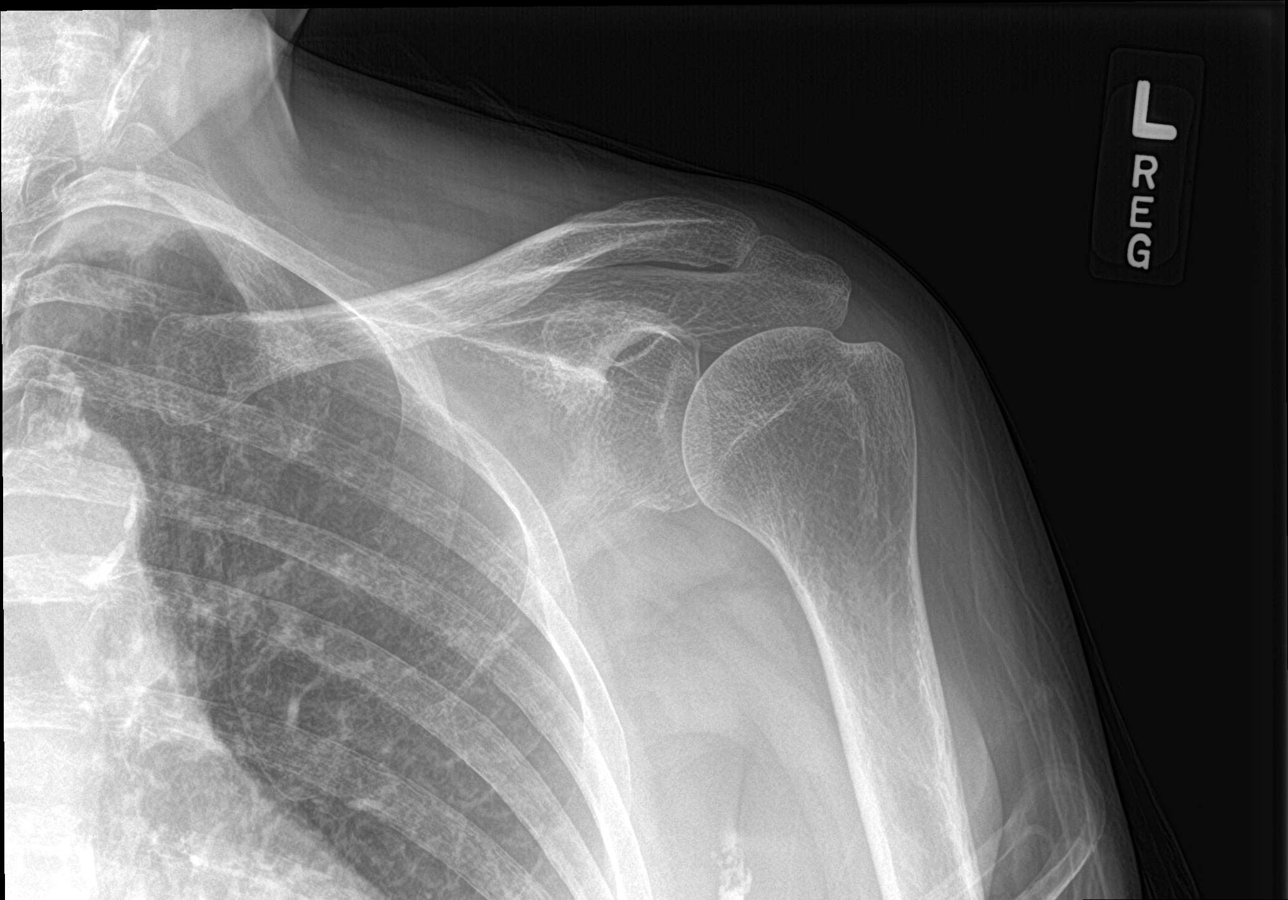

[shoulder y view]
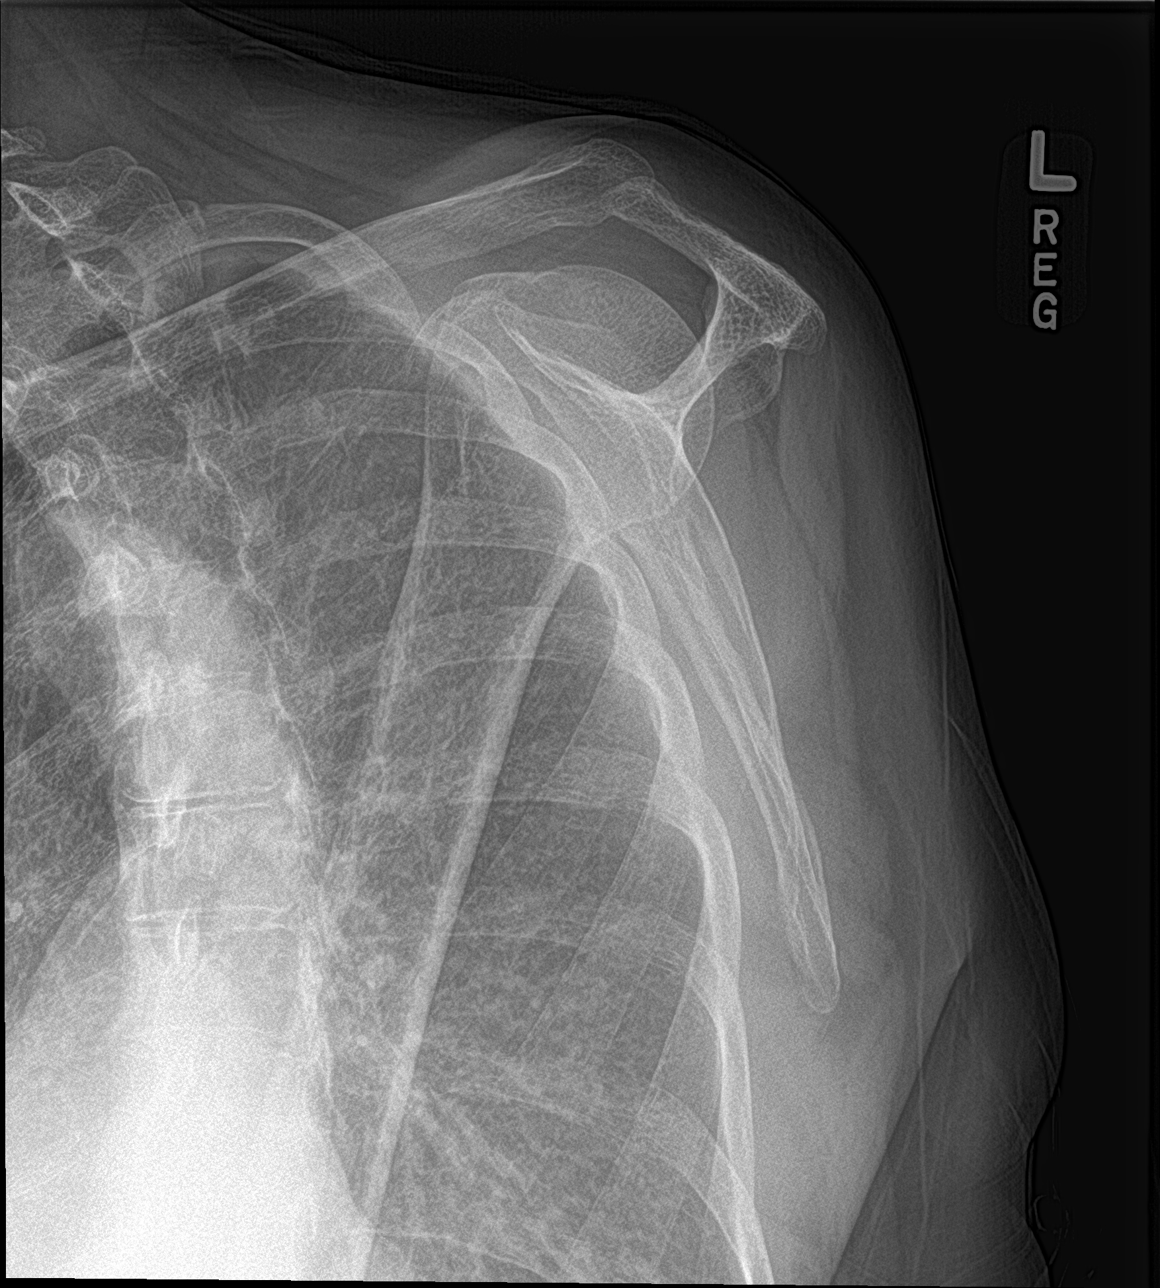

[shoulder axillary]
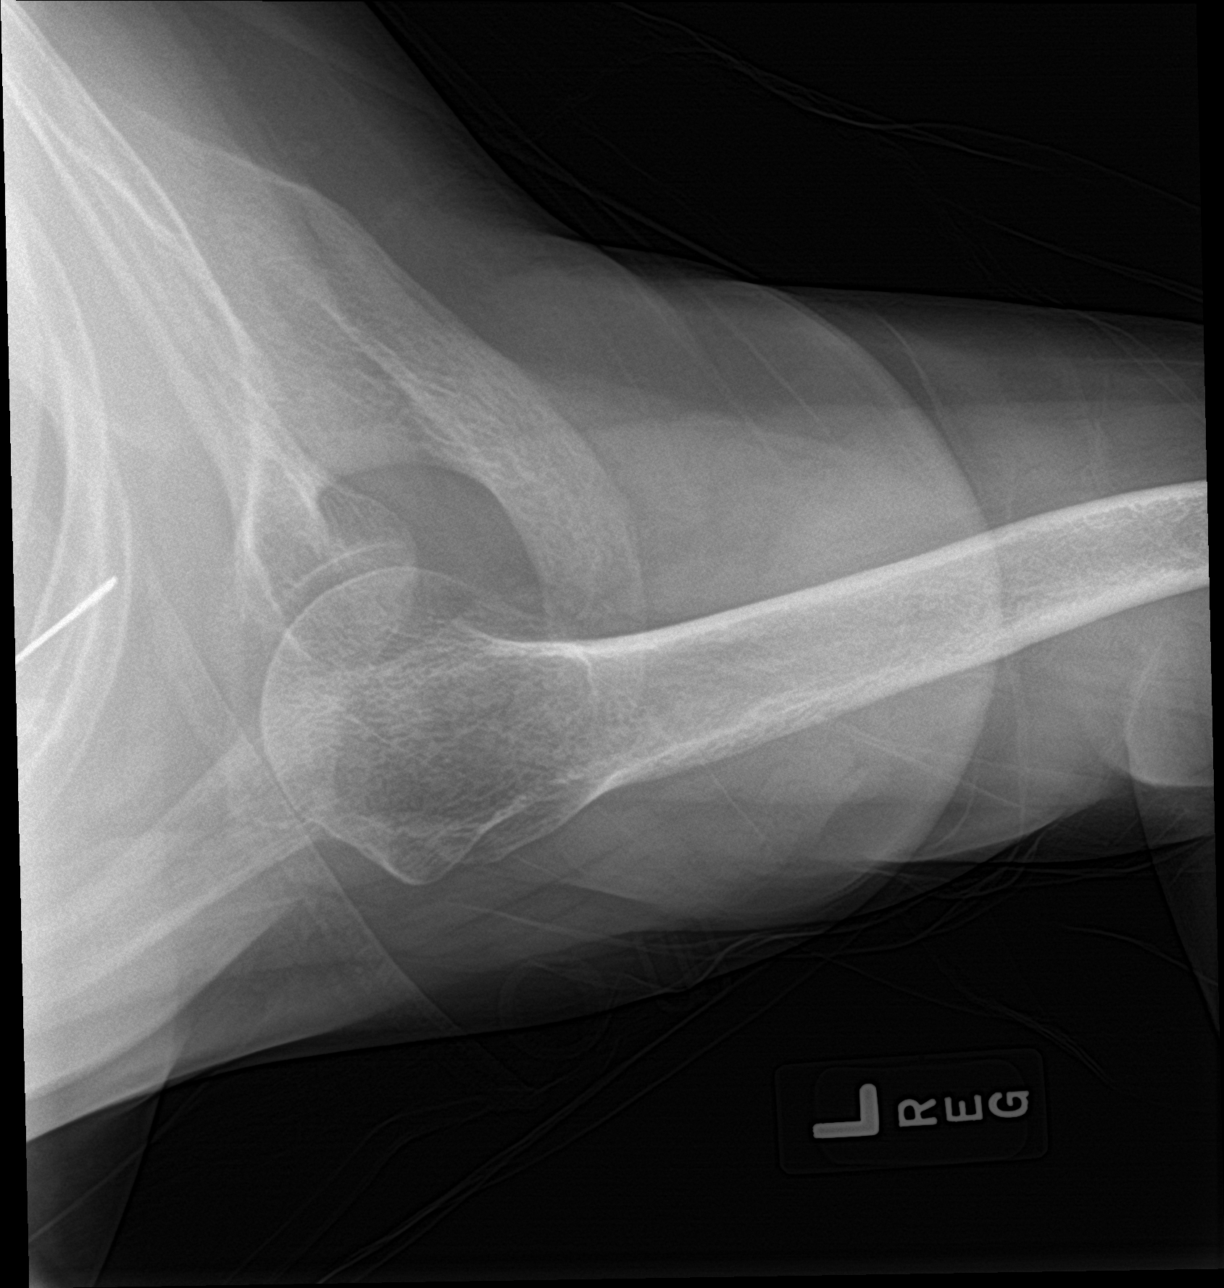

[3 of 3 positions shown; findings below may reference images not displayed]

FINDINGS: There is no evidence of fracture or dislocation. There is no
evidence of arthropathy or other focal bone abnormality. Soft
tissues are unremarkable.
IMPRESSION: Negative.

## 2019-12-24 ENCOUNTER — Other Ambulatory Visit: Payer: Self-pay | Admitting: Osteopathic Medicine

## 2020-01-26 ENCOUNTER — Telehealth (INDEPENDENT_AMBULATORY_CARE_PROVIDER_SITE_OTHER): Payer: Federal, State, Local not specified - PPO | Admitting: Osteopathic Medicine

## 2020-01-26 ENCOUNTER — Encounter: Payer: Self-pay | Admitting: Osteopathic Medicine

## 2020-01-26 VITALS — Wt 175.0 lb

## 2020-01-26 DIAGNOSIS — Z20822 Contact with and (suspected) exposure to covid-19: Secondary | ICD-10-CM | POA: Diagnosis not present

## 2020-01-26 DIAGNOSIS — J069 Acute upper respiratory infection, unspecified: Secondary | ICD-10-CM | POA: Diagnosis not present

## 2020-01-26 DIAGNOSIS — Z8709 Personal history of other diseases of the respiratory system: Secondary | ICD-10-CM

## 2020-01-26 MED ORDER — ALBUTEROL SULFATE HFA 108 (90 BASE) MCG/ACT IN AERS: 1.0000 | INHALATION_SPRAY | RESPIRATORY_TRACT | 2 refills | Status: DC | PRN

## 2020-01-26 MED ORDER — PREDNISONE 20 MG PO TABS: 20.0000 mg | ORAL_TABLET | Freq: Two times a day (BID) | ORAL | 0 refills | Status: DC

## 2020-01-26 MED ORDER — GUAIFENESIN-CODEINE 100-10 MG/5ML PO SYRP: 5.0000 mL | ORAL_SOLUTION | Freq: Four times a day (QID) | ORAL | 0 refills | Status: DC | PRN

## 2020-01-26 NOTE — Patient Instructions (Signed)
Plan:  Possible viral respiratory infection / COPD Rx: steroids, cough syrup, inhaler. If NO better w/ 2-3 days of the above, will trial antibiotics but I don't think this will be helpful.   COVID testing today! Vaccinated persons can still get sick, though thankfully this is usually not as severe and much less risk of hospitalization/death. I recommend DO NOT travel or socialize until 10+ days after initial onset of symptoms, regardless of COVID test results (in case of false negative test).

## 2020-01-26 NOTE — Progress Notes (Signed)
Telemedicine Visit via  Video & Audio (App used: MyChart)   I connected with Tina Navarro on 01/26/20 at 1:40 PM  by phone or  telemedicine application as noted above  I verified that I am speaking with or regarding  the correct patient using two identifiers.  Participants: Dr Sunnie Nielsen DO Patient: Tina Navarro, MS3  Patient is in separate location from myself  I am in office at [facility]    I discussed the limitations of evaluation and management  by telemedicine and the availability of in person appointments.  The participant(s) above expressed understanding and  agreed to proceed with this appointment via telemedicine.       History of Present Illness: Tina Navarro is a 59 y.o. female who would like to discuss    Cough - productive for brown/green sputum  . Location/Quality: feels like it is burning cough in the chest . Duration: about a week  . Timing: worse at night and morning . Context: similar sx every winter; usually needs cough syrup with codeine  . Modifying factors: dayquil/nyquil give temporary relief . Assoc signs/symptoms: o Denies fever, vomiting, sick contacts, chest tightness o Endorses general fatigue, general myalgias, nausea, mild SOB, headache     Observations/Objective: Wt 175 lb (79.4 kg)   LMP  (LMP Unknown)   BMI 29.12 kg/m  BP Readings from Last 3 Encounters:  09/21/19 (!) 164/80  08/26/19 (!) 148/83  01/12/19 (!) 152/83   Exam: Normal Speech.    Lab and Radiology Results No results found for this or any previous visit (from the past 72 hour(s)). No results found.     Assessment and Plan: 59 y.o. female with The primary encounter diagnosis was History of COPD. Diagnoses of Suspected COVID-19 virus infection and Viral URI with cough were also pertinent to this visit.  --> Tx as COPD exacerbation Advised COVID testing, done today   PDMP not reviewed this encounter. Orders  Placed This Encounter  Procedures  . Novel Coronavirus, NAA ([facility])    Order Specific Question:   Is this test for diagnosis or screening    Answer:   Diagnosis of ill patient    Order Specific Question:   Symptomatic for COVID-19 as defined by CDC    Answer:   Yes    Order Specific Question:   Date of Symptom Onset    Answer:   01/20/2020    Order Specific Question:   Hospitalized for COVID-19    Answer:   No    Order Specific Question:   Admitted to ICU for COVID-19    Answer:   No    Order Specific Question:   Previously tested for COVID-19    Answer:   Yes    Order Specific Question:   Resident in a congregate (group) care setting    Answer:   No    Order Specific Question:   Is the patient student?    Answer:   No    Order Specific Question:   Employed in healthcare setting    Answer:   No    Order Specific Question:   Pregnant    Answer:   No    Order Specific Question:   Has patient completed COVID vaccination(s) (2 doses of Pfizer/Moderna 1 dose of [employer])    Answer:   Yes   Meds ordered this encounter  Medications  . guaiFENesin-codeine (ROBITUSSIN AC) 100-10 MG/5ML syrup    Sig: Take 5-10 mLs by  mouth 4 (four) times daily as needed for cough.    Dispense:  180 mL    Refill:  0  . predniSONE (DELTASONE) 20 MG tablet    Sig: Take 1 tablet (20 mg total) by mouth 2 (two) times daily with a meal.    Dispense:  10 tablet    Refill:  0  . albuterol (VENTOLIN HFA) 108 (90 Base) MCG/ACT inhaler    Sig: Inhale 1-2 puffs into the lungs every 4 (four) hours as needed for wheezing or shortness of breath.    Dispense:  1 each    Refill:  2   Patient Instructions  Plan:  Possible viral respiratory infection / COPD Rx: steroids, cough syrup, inhaler. If NO better w/ 2-3 days of the above, will trial antibiotics but I don't think this will be helpful.   COVID testing today! Vaccinated persons can still get sick, though thankfully this is usually not as severe and  much less risk of hospitalization/death. I recommend DO NOT travel or socialize until 10+ days after initial onset of symptoms, regardless of COVID test results (in case of false negative test).      Instructions sent via MyChart.   Follow Up Instructions: Return if symptoms worsen or fail to improve.    I discussed the assessment and treatment plan with the patient. The patient was provided an opportunity to ask questions and all were answered. The patient agreed with the plan and demonstrated an understanding of the instructions.   The patient was advised to call back or seek an in-person evaluation if any new concerns, if symptoms worsen or if the condition fails to improve as anticipated.  20 minutes of non-face-to-face time was provided during this encounter.      . . . . . . . . . . . . . Marland Kitchen                   Historical information moved to improve visibility of documentation.  Past Medical History:  Diagnosis Date  . Emphysematous COPD (HCC) 04/23/2017  . GERD (gastroesophageal reflux disease)   . Hyperlipidemia   . Peripheral arterial disease (HCC)   . Thyroid nodule 04/23/2017   Past Surgical History:  Procedure Laterality Date  . ABDOMINAL AORTOGRAM N/A 07/16/2017   Procedure: ABDOMINAL AORTOGRAM;  Surgeon: Nada Libman, MD;  Location: [facility];  Service: Cardiovascular;  Laterality: N/A;  . ABDOMINAL AORTOGRAM W/LOWER EXTREMITY Bilateral 12/02/2018   Procedure: ABDOMINAL AORTOGRAM W/LOWER EXTREMITY;  Surgeon: Nada Libman, MD;  Location: [facility];  Service: Cardiovascular;  Laterality: Bilateral;  . AORTIC ARCH ANGIOGRAPHY N/A 12/02/2018   Procedure: AORTIC ARCH ANGIOGRAPHY;  Surgeon: Nada Libman, MD;  Location: [facility];  Service: Cardiovascular;  Laterality: N/A;  . ENDOMETRIAL BIOPSY  2005  . FEMORAL ARTERY STENT Bilateral 2008  . LOWER EXTREMITY ANGIOGRAPHY Bilateral 07/16/2017   Procedure:  Lower Extremity Angiography;  Surgeon: Nada Libman, MD;  Location: [facility];  Service: Cardiovascular;  Laterality: Bilateral;  . NASAL SINUS SURGERY    . PERIPHERAL VASCULAR BALLOON ANGIOPLASTY Bilateral 07/16/2017   Procedure: PERIPHERAL VASCULAR BALLOON ANGIOPLASTY;  Surgeon: Nada Libman, MD;  Location: [facility];  Service: Cardiovascular;  Laterality: Bilateral;  iliacs  . PERIPHERAL VASCULAR INTERVENTION Bilateral 12/02/2018   Procedure: PERIPHERAL VASCULAR INTERVENTION;  Surgeon: Nada Libman, MD;  Location: [facility];  Service: Cardiovascular;  Laterality: Bilateral;  .  TONSILLECTOMY    . TUBAL LIGATION     Social History   Tobacco Use  . Smoking status: Current Every Day Smoker    Packs/day: 0.50    Years: 35.00    Pack years: 17.50    Types: Cigarettes    Last attempt to quit: 12/18/2017    Years since quitting: 2.1  . Smokeless tobacco: Never Used  . Tobacco comment: As per pt - smoking 1/2 pk daily  Substance Use Topics  . Alcohol use: No   family history includes Alcohol abuse in her father; Cancer in her maternal grandmother; Cancer - Ovarian in her cousin; Fibroids in her sister; Heart disease in her father; Hypertension in her father and mother; Peripheral vascular disease in her father; Stroke in her maternal grandmother.  Medications: Current Outpatient Medications  Medication Sig Dispense Refill  . Aspirin-Salicylamide-Caffeine (BC HEADACHE POWDER PO) Take 1 packet by mouth daily as needed (for headache).     Marland Kitchen atorvastatin (LIPITOR) 40 MG tablet TAKE ONE TABLET BY MOUTH AT BEDTIME 90 tablet 0  . Cholecalciferol (VITAMIN D3 PO) Take 4,000 mg by mouth at bedtime.    . clobetasol ointment (TEMOVATE) 0.05 % APPLY TO THE AFFECTED AREA(S) TOPICALLY TWICE DAILY AS NEEDED FOR A MAX OF TWO WEEKS TO AVOID WHITENING/THINNING SKIN. 15 g 1  . clopidogrel (PLAVIX) 75 MG tablet Take 1 tablet (75 mg total) by mouth daily. 90 tablet 3  .  diclofenac sodium (VOLTAREN) 1 % GEL Apply 4 g topically 4 (four) times daily. To affected joint. 100 g 11  . estradiol (ESTRACE) 0.1 MG/GM vaginal cream Place 1 Applicatorful vaginally 3 (three) times a week. 42.5 g 12  . ibuprofen (ADVIL) 600 MG tablet Take 600 mg by mouth every 6 (six) hours as needed (Shoulder pain).    Marland Kitchen losartan (COZAAR) 100 MG tablet Take 1 tablet (100 mg total) by mouth daily. 90 tablet 3  . omeprazole (PRILOSEC) 40 MG capsule Take 1 capsule (40 mg total) by mouth daily. 180 capsule 3  . promethazine (PHENERGAN) 12.5 MG tablet Take 1 tablet (12.5 mg total) by mouth every 4 (four) hours as needed for nausea. 30 tablet 1  . senna (SENOKOT) 8.6 MG tablet Take 2 tablets (17.2 mg total) by mouth daily. As needed for constipation (Patient taking differently: Take 2 tablets by mouth daily as needed for constipation. ) 90 tablet 1  . vitamin B-12 (CYANOCOBALAMIN) 1000 MCG tablet Take 1,000 mcg by mouth daily.     Marland Kitchen albuterol (VENTOLIN HFA) 108 (90 Base) MCG/ACT inhaler Inhale 1-2 puffs into the lungs every 4 (four) hours as needed for wheezing or shortness of breath. 1 each 2  . guaiFENesin-codeine (ROBITUSSIN AC) 100-10 MG/5ML syrup Take 5-10 mLs by mouth 4 (four) times daily as needed for cough. 180 mL 0  . icosapent Ethyl (VASCEPA) 1 g capsule Take 2 capsules (2 g total) by mouth 2 (two) times daily. 360 capsule 3  . predniSONE (DELTASONE) 20 MG tablet Take 1 tablet (20 mg total) by mouth 2 (two) times daily with a meal. 10 tablet 0   No current facility-administered medications for this visit.   Allergies  Allergen Reactions  . Doxycycline Hives, Rash and Other (See Comments)    Pain, photosensitive

## 2020-01-27 LAB — POCT INFLUENZA A/B
Influenza A, POC: NEGATIVE
Influenza B, POC: NEGATIVE

## 2020-01-27 NOTE — Addendum Note (Signed)
Addended by: Chalmers Cater on: 01/27/2020 09:10 AM   Modules accepted: Orders

## 2020-01-28 LAB — SARS-COV-2, NAA 2 DAY TAT

## 2020-01-28 LAB — SPECIMEN STATUS REPORT

## 2020-01-28 LAB — NOVEL CORONAVIRUS, NAA: SARS-CoV-2, NAA: NOT DETECTED

## 2020-02-12 ENCOUNTER — Other Ambulatory Visit: Payer: Self-pay

## 2020-02-12 ENCOUNTER — Ambulatory Visit (INDEPENDENT_AMBULATORY_CARE_PROVIDER_SITE_OTHER): Payer: Federal, State, Local not specified - PPO | Admitting: Osteopathic Medicine

## 2020-02-12 ENCOUNTER — Encounter: Payer: Self-pay | Admitting: Osteopathic Medicine

## 2020-02-12 ENCOUNTER — Ambulatory Visit (INDEPENDENT_AMBULATORY_CARE_PROVIDER_SITE_OTHER): Payer: Federal, State, Local not specified - PPO

## 2020-02-12 VITALS — BP 171/90 | HR 87 | Temp 98.0°F | Wt 171.1 lb

## 2020-02-12 DIAGNOSIS — Z87891 Personal history of nicotine dependence: Secondary | ICD-10-CM | POA: Diagnosis not present

## 2020-02-12 DIAGNOSIS — J439 Emphysema, unspecified: Secondary | ICD-10-CM | POA: Diagnosis not present

## 2020-02-12 DIAGNOSIS — I10 Essential (primary) hypertension: Secondary | ICD-10-CM

## 2020-02-12 DIAGNOSIS — R071 Chest pain on breathing: Secondary | ICD-10-CM

## 2020-02-12 DIAGNOSIS — E041 Nontoxic single thyroid nodule: Secondary | ICD-10-CM | POA: Diagnosis not present

## 2020-02-12 DIAGNOSIS — Z8709 Personal history of other diseases of the respiratory system: Secondary | ICD-10-CM

## 2020-02-12 DIAGNOSIS — R079 Chest pain, unspecified: Secondary | ICD-10-CM | POA: Diagnosis not present

## 2020-02-12 LAB — CBC
HCT: 37.1 % (ref 35.0–45.0)
Hemoglobin: 12.4 g/dL (ref 11.7–15.5)
MCH: 28.1 pg (ref 27.0–33.0)
MCHC: 33.4 g/dL (ref 32.0–36.0)
MCV: 84.1 fL (ref 80.0–100.0)
MPV: 9 fL (ref 7.5–12.5)
Platelets: 302 10*3/uL (ref 140–400)
RBC: 4.41 10*6/uL (ref 3.80–5.10)
RDW: 13.7 % (ref 11.0–15.0)
WBC: 5.5 10*3/uL (ref 3.8–10.8)

## 2020-02-12 LAB — COMPLETE METABOLIC PANEL WITH GFR
AG Ratio: 1.8 (calc) (ref 1.0–2.5)
ALT: 14 U/L (ref 6–29)
AST: 12 U/L (ref 10–35)
Albumin: 3.7 g/dL (ref 3.6–5.1)
Alkaline phosphatase (APISO): 108 U/L (ref 37–153)
BUN: 14 mg/dL (ref 7–25)
CO2: 30 mmol/L (ref 20–32)
Calcium: 8.9 mg/dL (ref 8.6–10.4)
Chloride: 102 mmol/L (ref 98–110)
Creat: 0.74 mg/dL (ref 0.50–1.05)
GFR, Est African American: 103 mL/min/{1.73_m2} (ref >=60)
GFR, Est Non African American: 89 mL/min/{1.73_m2} (ref >=60)
Globulin: 2.1 g/dL (calc) (ref 1.9–3.7)
Glucose, Bld: 89 mg/dL (ref 65–99)
Potassium: 3.2 mmol/L — ABNORMAL LOW (ref 3.5–5.3)
Sodium: 141 mmol/L (ref 135–146)
Total Bilirubin: 0.3 mg/dL (ref 0.2–1.2)
Total Protein: 5.8 g/dL — ABNORMAL LOW (ref 6.1–8.1)

## 2020-02-12 LAB — TROPONIN I
Troponin I: 3 ng/L (ref <=47)
Troponin I: 3 ng/L (ref <=47)

## 2020-02-12 LAB — I-STAT CREATININE (MANUAL ENTRY): Creatinine, Ser: 0.8 (ref 0.50–1.10)

## 2020-02-12 MED ORDER — ALBUTEROL SULFATE HFA 108 (90 BASE) MCG/ACT IN AERS: 1.0000 | INHALATION_SPRAY | RESPIRATORY_TRACT | 2 refills | Status: DC | PRN

## 2020-02-12 MED ORDER — METOPROLOL SUCCINATE ER 50 MG PO TB24: 50.0000 mg | ORAL_TABLET | Freq: Every day | ORAL | 0 refills | Status: DC

## 2020-02-12 MED ORDER — IBUPROFEN 600 MG PO TABS
600.0000 mg | ORAL_TABLET | Freq: Three times a day (TID) | ORAL | 0 refills | Status: AC
Start: 2020-02-12 — End: 2020-02-19

## 2020-02-12 MED ORDER — IOHEXOL 350 MG/ML SOLN
100.0000 mL | Freq: Once | INTRAVENOUS | Status: AC | PRN
  Administered 2020-02-12: 66 mL via INTRAVENOUS

## 2020-02-12 MED ORDER — PREDNISONE 20 MG PO TABS: 20.0000 mg | ORAL_TABLET | Freq: Two times a day (BID) | ORAL | 0 refills | Status: DC

## 2020-02-12 NOTE — Addendum Note (Signed)
Addended by: Delfino Lovett on: 02/12/2020 12:19 PM   Modules accepted: Orders

## 2020-02-12 NOTE — Patient Instructions (Addendum)
Plan:  Chest pain seems unlikely to be heart related but I'd like to make sure. To do this we are going to... 1. CT chest today to make sure lungs, heart, large blood vessels are ok 2. Labs now including Troponin 3. Repeat labs at 4:30 PM to repeat Troponin levels. If Troponins are seriously high or are going up on second measurement, this is concerning for heart attack and you will need to go to the hospital!  4. Cardiology referral to discuss stress testing  I think pleuritis or costochondritis is more likely - inflammation of lungs/ribs possibly due to COVID history, more likely given smoking history as well. Will trial steroid burst and anti-inflammatory w/ Ibuprofen - prescriptions sent to pharmacy. Also ok to use inhaler as needed.   I also sent additional BP medication to pharmacy: Metoprolol. Please start this today.   If mental health is something we need to talk about more, please let me know!   If you feel worse, especially worsening chest pain or pain/breathing trouble on exertion, please call 911 / go to ER!

## 2020-02-12 NOTE — Progress Notes (Signed)
Tina Navarro is a 59 y.o. female who presents to  [facility] at [facility]  today, 02/12/20, seeking care for the following:  . CHEST PAIN - L sided upper chest, feels like sometimes pressure other times sharp, ongoing 2-3 weeks and no change, relatively contant and worse w/ deep breaths. Reports radiating back/chest. Is associated w/ SOB on exertion, H/A, dizziness. Has been taking OTC antacids w/o relief. BP as below, HTN uncontrolled.  Feels like abdomen fullness and pressure into L ribs.  o EKG today NSR Rate 86, flat T wave in aVL, no pathologic Q waves or ST elevation/depression, T inversion.  o On exam: RRR, normal s1S2, no LE edema, no JVD, CTABL, abdomen nontender. (+)tenderness to chest wall at sternum around rib 5-7 centrally and on L.  Marland Kitchen ANXIETY/STRESS - taking care of her ill mother, who was recently sick w/ COVID and also Dx w/ CAD.      ASSESSMENT & PLAN with other pertinent findings:  The primary encounter diagnosis was Chest pain on breathing. Diagnoses of History of COPD, Former smoker, Essential hypertension, and Chest pain, unspecified type were also pertinent to this visit.   Risk factors for CAD. Ongoing symptoms for several weeks in light of significant life stressors, uncontrolled HTN and chest wall tenderness seems more likely somatization of anxiety vs chest wall inflammation (costochondritis, pleuritis - former smoker and former COVID infection). Concern given radiation to back, I'd like to get CT to eval for dissection/PE. See patient instructions below.     BP Readings from Last 3 Encounters:  02/12/20 (!) 171/90  09/21/19 (!) 164/80  08/26/19 (!) 148/83     Patient Instructions  Plan:  Chest pain seems unlikely to be heart related but I'd like to make sure. To do this we are going to... 1. CT chest today to make sure lungs, heart, large blood vessels are ok 2. Labs now including Troponin 3. Repeat labs at  4:30 PM to repeat Troponin levels. If Troponins are seriously high or are going up on second measurement, this is concerning for heart attack and you will need to go to the hospital!  4. Cardiology referral to discuss stress testing  I think pleuritis or costochondritis is more likely - inflammation of lungs/ribs possibly due to COVID history, more likely given smoking history as well. Will trial steroid burst and anti-inflammatory w/ Ibuprofen - prescriptions sent to pharmacy. Also ok to use inhaler as needed.   I also sent additional BP medication to pharmacy: Metoprolol. Please start this today.   If mental health is something we need to talk about more, please let me know!   If you feel worse, especially worsening chest pain or pain/breathing trouble on exertion, please call 911 / go to ER!     Orders Placed This Encounter  Procedures  . CT ANGIO CHEST PE W OR WO CONTRAST  . CBC  . COMPLETE METABOLIC PANEL WITH GFR  . Troponin I  . Troponin I  . Ambulatory referral to Cardiology    Meds ordered this encounter  Medications  . predniSONE (DELTASONE) 20 MG tablet    Sig: Take 1 tablet (20 mg total) by mouth 2 (two) times daily with a meal.    Dispense:  10 tablet    Refill:  0  . ibuprofen (ADVIL) 600 MG tablet    Sig: Take 1 tablet (600 mg total) by mouth 3 (three) times daily for 7 days.  Dispense:  21 tablet    Refill:  0  . albuterol (VENTOLIN HFA) 108 (90 Base) MCG/ACT inhaler    Sig: Inhale 1-2 puffs into the lungs every 4 (four) hours as needed for wheezing or shortness of breath.    Dispense:  1 each    Refill:  2  . metoprolol succinate (TOPROL-XL) 50 MG 24 hr tablet    Sig: Take 1 tablet (50 mg total) by mouth daily.    Dispense:  90 tablet    Refill:  0       Follow-up instructions: Return for RECHECK PENDING RESULTS / IF WORSE OR CHANGE.                                         BP (!) 171/90 (BP Location: Right Arm,  Patient Position: Sitting, Cuff Size: Normal)   Pulse 87   Temp 98 F (36.7 C) (Oral)   Wt 171 lb 1.3 oz (77.6 kg)   LMP  (LMP Unknown)   BMI 28.47 kg/m   Current Meds  Medication Sig  . Aspirin-Salicylamide-Caffeine (BC HEADACHE POWDER PO) Take 1 packet by mouth daily as needed (for headache).   Marland Kitchen atorvastatin (LIPITOR) 40 MG tablet TAKE ONE TABLET BY MOUTH AT BEDTIME  . Cholecalciferol (VITAMIN D3 PO) Take 4,000 mg by mouth at bedtime.  . clobetasol ointment (TEMOVATE) 0.05 % APPLY TO THE AFFECTED AREA(S) TOPICALLY TWICE DAILY AS NEEDED FOR A MAX OF TWO WEEKS TO AVOID WHITENING/THINNING SKIN.  . clopidogrel (PLAVIX) 75 MG tablet Take 1 tablet (75 mg total) by mouth daily.  . diclofenac sodium (VOLTAREN) 1 % GEL Apply 4 g topically 4 (four) times daily. To affected joint.  Marland Kitchen estradiol (ESTRACE) 0.1 MG/GM vaginal cream Place 1 Applicatorful vaginally 3 (three) times a week.  Marland Kitchen guaiFENesin-codeine (ROBITUSSIN AC) 100-10 MG/5ML syrup Take 5-10 mLs by mouth 4 (four) times daily as needed for cough.  . losartan (COZAAR) 100 MG tablet Take 1 tablet (100 mg total) by mouth daily.  Marland Kitchen omeprazole (PRILOSEC) 40 MG capsule Take 1 capsule (40 mg total) by mouth daily.  . promethazine (PHENERGAN) 12.5 MG tablet Take 1 tablet (12.5 mg total) by mouth every 4 (four) hours as needed for nausea.  Marland Kitchen senna (SENOKOT) 8.6 MG tablet Take 2 tablets (17.2 mg total) by mouth daily. As needed for constipation (Patient taking differently: Take 2 tablets by mouth daily as needed for constipation.)  . vitamin B-12 (CYANOCOBALAMIN) 1000 MCG tablet Take 1,000 mcg by mouth daily.   . [DISCONTINUED] albuterol (VENTOLIN HFA) 108 (90 Base) MCG/ACT inhaler Inhale 1-2 puffs into the lungs every 4 (four) hours as needed for wheezing or shortness of breath.  . [DISCONTINUED] ibuprofen (ADVIL) 600 MG tablet Take 600 mg by mouth every 6 (six) hours as needed (Shoulder pain).  . [DISCONTINUED] predniSONE (DELTASONE) 20 MG tablet  Take 1 tablet (20 mg total) by mouth 2 (two) times daily with a meal.    No results found for this or any previous visit (from the past 72 hour(s)).  No results found.     All questions at time of visit were answered - patient instructed to contact office with any additional concerns or updates.  ER/RTC precautions were reviewed with the patient as applicable.   Please note: voice recognition software was used to produce this document, and typos may escape review. Please contact Dr.  Webb Weed for any needed clarifications.   Total encounter time: 60 minutes on record review, eval/treat patient face to face, following stat imaging and lab results, will update note as results become available.

## 2020-02-13 ENCOUNTER — Encounter: Payer: Self-pay | Admitting: Osteopathic Medicine

## 2020-04-01 ENCOUNTER — Other Ambulatory Visit: Payer: Self-pay | Admitting: Osteopathic Medicine

## 2020-04-01 DIAGNOSIS — K588 Other irritable bowel syndrome: Secondary | ICD-10-CM

## 2020-07-26 ENCOUNTER — Telehealth: Payer: Self-pay

## 2020-07-26 DIAGNOSIS — Z87891 Personal history of nicotine dependence: Secondary | ICD-10-CM

## 2020-07-26 DIAGNOSIS — Z8709 Personal history of other diseases of the respiratory system: Secondary | ICD-10-CM

## 2020-07-26 DIAGNOSIS — R071 Chest pain on breathing: Secondary | ICD-10-CM

## 2020-07-26 DIAGNOSIS — R079 Chest pain, unspecified: Secondary | ICD-10-CM

## 2020-07-26 DIAGNOSIS — I10 Essential (primary) hypertension: Secondary | ICD-10-CM

## 2020-07-26 NOTE — Telephone Encounter (Signed)
Pt called stating her referral for Cardiologist ordered in November has expired. Per pt, she has spent the last 6 mths in [place] caring for her ill mother. Referral pended.

## 2020-08-31 DIAGNOSIS — Z6830 Body mass index (BMI) 30.0-30.9, adult: Secondary | ICD-10-CM | POA: Diagnosis not present

## 2020-08-31 DIAGNOSIS — N952 Postmenopausal atrophic vaginitis: Secondary | ICD-10-CM | POA: Diagnosis not present

## 2020-08-31 DIAGNOSIS — Z01419 Encounter for gynecological examination (general) (routine) without abnormal findings: Secondary | ICD-10-CM | POA: Diagnosis not present

## 2020-09-01 ENCOUNTER — Ambulatory Visit: Payer: Federal, State, Local not specified - PPO | Admitting: Osteopathic Medicine

## 2020-09-01 ENCOUNTER — Other Ambulatory Visit: Payer: Self-pay

## 2020-09-01 ENCOUNTER — Ambulatory Visit (INDEPENDENT_AMBULATORY_CARE_PROVIDER_SITE_OTHER): Payer: Federal, State, Local not specified - PPO

## 2020-09-01 VITALS — BP 156/87 | HR 92 | Ht 65.0 in | Wt 180.0 lb

## 2020-09-01 DIAGNOSIS — M79645 Pain in left finger(s): Secondary | ICD-10-CM

## 2020-09-01 DIAGNOSIS — G8929 Other chronic pain: Secondary | ICD-10-CM | POA: Diagnosis not present

## 2020-09-01 DIAGNOSIS — I10 Essential (primary) hypertension: Secondary | ICD-10-CM

## 2020-09-01 DIAGNOSIS — Z8709 Personal history of other diseases of the respiratory system: Secondary | ICD-10-CM | POA: Diagnosis not present

## 2020-09-01 DIAGNOSIS — E042 Nontoxic multinodular goiter: Secondary | ICD-10-CM

## 2020-09-01 DIAGNOSIS — M79642 Pain in left hand: Secondary | ICD-10-CM

## 2020-09-01 DIAGNOSIS — Z87891 Personal history of nicotine dependence: Secondary | ICD-10-CM | POA: Diagnosis not present

## 2020-09-01 DIAGNOSIS — S6992XA Unspecified injury of left wrist, hand and finger(s), initial encounter: Secondary | ICD-10-CM | POA: Diagnosis not present

## 2020-09-01 DIAGNOSIS — K219 Gastro-esophageal reflux disease without esophagitis: Secondary | ICD-10-CM

## 2020-09-01 DIAGNOSIS — E041 Nontoxic single thyroid nodule: Secondary | ICD-10-CM

## 2020-09-01 MED ORDER — OMEPRAZOLE 40 MG PO CPDR: 40.0000 mg | DELAYED_RELEASE_CAPSULE | Freq: Every day | ORAL | 3 refills | Status: DC

## 2020-09-01 MED ORDER — ALBUTEROL SULFATE HFA 108 (90 BASE) MCG/ACT IN AERS: 1.0000 | INHALATION_SPRAY | RESPIRATORY_TRACT | 2 refills | Status: DC | PRN

## 2020-09-01 MED ORDER — METOPROLOL SUCCINATE ER 50 MG PO TB24: 50.0000 mg | ORAL_TABLET | Freq: Every day | ORAL | 3 refills | Status: DC

## 2020-09-01 MED ORDER — ATORVASTATIN CALCIUM 40 MG PO TABS: 40.0000 mg | ORAL_TABLET | Freq: Every day | ORAL | 3 refills | Status: DC

## 2020-09-01 MED ORDER — LOSARTAN POTASSIUM 100 MG PO TABS: 100.0000 mg | ORAL_TABLET | Freq: Every day | ORAL | 3 refills | Status: DC

## 2020-09-01 NOTE — Patient Instructions (Signed)
L HAND PAIN -  Splint when sleeping / as it bothers you OK to take Ibuprofen as needed I also sent Voltaren Gel which might help when applied to skin above the pain  Xray today - would be surprised if any evidence of break, but let's see!  If no better, will arrange orthopedic follow-up   LABS DUE An appointment is needed to get blood drawn in our office: please call [phone] to be placed on our phlebotomist's schedule.  Otherwise, no appointment is needed to bet blood drawn at [facility]: located in lower level of [facility], room 135, they are open 7:00 AM - 4:00 PM on Monday thru Friday.

## 2020-09-01 NOTE — Progress Notes (Signed)
Tina Navarro is a 60 y.o. female who presents to  [facility] at [facility]  today, 09/01/20, seeking care for the following:  HAND PAIN ON L - HIT IT BACK IN 11/2019, PAIN AT THAT TIME, NOW FEELS LIKE IT COMES AND GOES, SOMETIMES WAKES HER UP AT NIGHT. Reprots pai non radial aspect L thumb across palmar and dorsal surface, dull but occasionally sharp. No worse w/ resisted abduction, neg finkelsteins, normal wrist exam      ASSESSMENT & PLAN with other pertinent findings:  The primary encounter diagnosis was Left hand pain. Diagnoses of Chronic pain of left thumb, Former smoker, History of COPD, Essential hypertension, Thyroid nodule, and Multinodular goiter were also pertinent to this visit.   1. Left hand pain 2. Chronic pain of left thumb No significant findings on physical exam See pt instructions: XR, NSAID, brace, consider ortho   3. Former smoker 4. History of COPD Refilled inhaler Stable - no cough / SOB  5. Essential hypertension BP above goal in office, was ok at [facility] yesterday Usual meds refilled   6. Thyroid nodule 7. Multinodular goiter TSH pending     Patient Instructions  L HAND PAIN -  Splint when sleeping / as it bothers you OK to take Ibuprofen as needed I also sent Voltaren Gel which might help when applied to skin above the pain  Xray today - would be surprised if any evidence of break, but let's see!  If no better, will arrange orthopedic follow-up   LABS DUE An appointment is needed to get blood drawn in our office: please call [phone] to be placed on our phlebotomist's schedule.  Otherwise, no appointment is needed to bet blood drawn at [facility]: located in lower level of [facility], room 135, they are open 7:00 AM - 4:00 PM on Monday thru Friday.    Orders Placed This Encounter  Procedures   CBC   COMPLETE METABOLIC PANEL WITH GFR   Lipid panel   TSH     No orders of the defined  types were placed in this encounter.    See below for relevant physical exam findings  See below for recent lab and imaging results reviewed  Medications, allergies, PMH, PSH, SocH, FamH reviewed below    Follow-up instructions: Return if symptoms worsen or fail to improve, for otherwise 6 mos for annual .                                        Exam:  BP (!) 156/87   Pulse 92   Ht 5\' 5"  (1.651 m)   Wt 180 lb (81.6 kg)   LMP  (LMP Unknown)   SpO2 97%   BMI 29.95 kg/m  Constitutional: VS see above. General Appearance: alert, well-developed, well-nourished, NAD Neck: No masses, trachea midline.  Respiratory: Normal respiratory effort. n Musculoskeletal: see above Abdominal: non-tender, non-distended, no appreciable organomegaly, neg Murphy's, BS [id number] Neurological: Normal balance/coordination. No tremor. Skin: warm, dry, intact.  Psychiatric: Normal judgment/insight. Normal mood and affect. Oriented x3.   No outpatient medications have been marked as taking for the 09/01/20 encounter (Office Visit) with 11/02/20, DO.    Allergies  Allergen Reactions   Doxycycline Hives, Rash and Other (See Comments)    Pain, photosensitive    Patient Active Problem List   Diagnosis Date Noted   Anxiety state  12/25/2017   Irritant contact dermatitis due to drug in contact with skin 12/25/2017   Thyroid nodule 04/23/2017   Emphysematous COPD (HCC) 04/23/2017   Rib pain on left side 04/23/2017   Neuropathy involving both lower extremities 04/22/2017   Pleuritic chest pain 04/22/2017   Essential hypertension 05/01/2016   Abnormal findings on esophagogastroduodenoscopy (EGD) 03/18/2016   BPPV (benign paroxysmal positional vertigo) 06/28/2015   Irritable bowel syndrome 04/01/2015   Dyslipidemia, goal LDL below 70 04/01/2015   Current tobacco use 07/28/2012   Multinodular goiter 12/19/2011   CN (constipation) 07/28/2011   Dyslipidemia  07/28/2011   Acid reflux 09/25/2006   Peripheral vascular disease (HCC) 09/25/2006    Family History  Problem Relation Age of Onset   Hypertension Mother    Hypertension Father    Heart disease Father    Peripheral vascular disease Father    Alcohol abuse Father    Cancer Maternal Grandmother        BREAST   Stroke Maternal Grandmother        CEBERAL ATHEROSCLEROSIS   Cancer - Ovarian Cousin    Fibroids Sister     Social History   Tobacco Use  Smoking Status Every Day   Packs/day: 0.50   Years: 35.00   Pack years: 17.50   Types: Cigarettes   Last attempt to quit: 12/18/2017   Years since quitting: 2.7  Smokeless Tobacco Never  Tobacco Comments   As per pt - smoking 1/2 pk daily    Past Surgical History:  Procedure Laterality Date   ABDOMINAL AORTOGRAM N/A 07/16/2017   Procedure: ABDOMINAL AORTOGRAM;  Surgeon: Nada Libman, MD;  Location: [facility];  Service: Cardiovascular;  Laterality: N/A;   ABDOMINAL AORTOGRAM W/LOWER EXTREMITY Bilateral 12/02/2018   Procedure: ABDOMINAL AORTOGRAM W/LOWER EXTREMITY;  Surgeon: Nada Libman, MD;  Location: [facility];  Service: Cardiovascular;  Laterality: Bilateral;   AORTIC ARCH ANGIOGRAPHY N/A 12/02/2018   Procedure: AORTIC ARCH ANGIOGRAPHY;  Surgeon: Nada Libman, MD;  Location: [facility];  Service: Cardiovascular;  Laterality: N/A;   ENDOMETRIAL BIOPSY  2005   FEMORAL ARTERY STENT Bilateral 2008   LOWER EXTREMITY ANGIOGRAPHY Bilateral 07/16/2017   Procedure: Lower Extremity Angiography;  Surgeon: Nada Libman, MD;  Location: [facility];  Service: Cardiovascular;  Laterality: Bilateral;   NASAL SINUS SURGERY     PERIPHERAL VASCULAR BALLOON ANGIOPLASTY Bilateral 07/16/2017   Procedure: PERIPHERAL VASCULAR BALLOON ANGIOPLASTY;  Surgeon: Nada Libman, MD;  Location: [facility];  Service: Cardiovascular;  Laterality: Bilateral;  iliacs   PERIPHERAL VASCULAR INTERVENTION  Bilateral 12/02/2018   Procedure: PERIPHERAL VASCULAR INTERVENTION;  Surgeon: Nada Libman, MD;  [facility]: [facility];  Service: Cardiovascular;  Laterality: Bilateral;   TONSILLECTOMY     TUBAL LIGATION      Immunization History  Administered Date(s) Administered   Influenza,inj,Quad PF,6+ Mos 12/08/2018   Moderna Sars-Covid-2 Vaccination 05/18/2019, 06/15/2019, 01/25/2020   Tdap 12/11/2005    No results found for this or any previous visit (from the past 2160 hour(s)).  No results found.     All questions at time of visit were answered - patient instructed to contact office with any additional concerns or updates. ER/RTC precautions were reviewed with the patient as applicable.   Please note: manual typing as well as voice recognition software may have been used to produce this document - typos may escape review. Please contact Dr. Lyn Hollingshead for any needed  clarifications.

## 2020-09-02 DIAGNOSIS — M79642 Pain in left hand: Secondary | ICD-10-CM | POA: Diagnosis not present

## 2020-09-02 DIAGNOSIS — I10 Essential (primary) hypertension: Secondary | ICD-10-CM | POA: Diagnosis not present

## 2020-09-02 DIAGNOSIS — E041 Nontoxic single thyroid nodule: Secondary | ICD-10-CM | POA: Diagnosis not present

## 2020-09-02 DIAGNOSIS — E042 Nontoxic multinodular goiter: Secondary | ICD-10-CM | POA: Diagnosis not present

## 2020-09-02 NOTE — Progress Notes (Signed)
Referring-Natalie Alexander, DO Reason for referral-chest pain  HPI: 60 year old female for evaluation of chest pain at request of Sunnie Nielsen, DO.  Patient does have a history of peripheral vascular disease and has had prior angioplasty and stent of her left common iliac artery and right common iliac artery.  Carotid Dopplers July 2021 showed 40 to 59% right and 1 to 39% left stenosis.  Left subclavian was occluded.  ABIs July 2021 normal bilaterally.  CTA December 2021 showed no pulmonary embolus, left thyroid nodule, emphysema and aortic atherosclerosis. Pt has previously complained of chest pain and cardiology now asked to evaluate.  Patient does have dyspnea on exertion.  She denies orthopnea, PND, pedal edema, exertional chest pain or syncope.  She had an episode of chest pain approximately 6 months ago in the left lateral chest area.  It was "on and off" without radiation.  It was not related to activities.  Current Outpatient Medications  Medication Sig Dispense Refill   albuterol (VENTOLIN HFA) 108 (90 Base) MCG/ACT inhaler Inhale 1-2 puffs into the lungs every 4 (four) hours as needed for wheezing or shortness of breath. 1 each 2   Aspirin-Salicylamide-Caffeine (BC HEADACHE POWDER PO) Take 1 packet by mouth daily as needed (for headache).      atorvastatin (LIPITOR) 40 MG tablet Take 1 tablet (40 mg total) by mouth at bedtime. 90 tablet 3   Cholecalciferol (VITAMIN D3 PO) Take 4,000 mg by mouth at bedtime.     clobetasol ointment (TEMOVATE) 0.05 % APPLY TO THE AFFECTED AREA(S) TOPICALLY TWICE DAILY AS NEEDED FOR A MAX OF TWO WEEKS TO AVOID WHITENING/THINNING SKIN. 15 g 1   clopidogrel (PLAVIX) 75 MG tablet Take 1 tablet (75 mg total) by mouth daily. 90 tablet 3   diclofenac sodium (VOLTAREN) 1 % GEL Apply 4 g topically 4 (four) times daily. To affected joint. 100 g 11   estradiol (ESTRACE) 0.1 MG/GM vaginal cream Place 1 Applicatorful vaginally 3 (three) times a week. 42.5 g 12    losartan (COZAAR) 100 MG tablet Take 1 tablet (100 mg total) by mouth daily. 90 tablet 3   omeprazole (PRILOSEC) 40 MG capsule Take 1 capsule (40 mg total) by mouth daily. 90 capsule 3   promethazine (PHENERGAN) 12.5 MG tablet Take 1 tablet (12.5 mg total) by mouth every 4 (four) hours as needed for nausea. 30 tablet 1   senna (SENOKOT) 8.6 MG tablet Take 2 tablets (17.2 mg total) by mouth daily. As needed for constipation (Patient taking differently: Take 2 tablets by mouth daily as needed for constipation.) 90 tablet 1   vitamin B-12 (CYANOCOBALAMIN) 1000 MCG tablet Take 1,000 mcg by mouth daily.      metoprolol succinate (TOPROL-XL) 50 MG 24 hr tablet Take 1 tablet (50 mg total) by mouth daily. (Patient not taking: Reported on 09/07/2020) 90 tablet 3   predniSONE (DELTASONE) 20 MG tablet Take 1 tablet (20 mg total) by mouth 2 (two) times daily with a meal. (Patient not taking: Reported on 09/07/2020) 10 tablet 0   No current facility-administered medications for this visit.    Allergies  Allergen Reactions   Doxycycline Hives, Rash and Other (See Comments)    Pain, photosensitive     Past Medical History:  Diagnosis Date   Emphysematous COPD (HCC) 04/23/2017   GERD (gastroesophageal reflux disease)    Hyperlipidemia    Hypertension    Peripheral arterial disease (HCC)    Thyroid nodule 04/23/2017    Past Surgical  History:  Procedure Laterality Date   ABDOMINAL AORTOGRAM N/A 07/16/2017   Procedure: ABDOMINAL AORTOGRAM;  Surgeon: Nada Libman, MD;  [facility]: [facility];  Service: Cardiovascular;  Laterality: N/A;   ABDOMINAL AORTOGRAM W/LOWER EXTREMITY Bilateral 12/02/2018   Procedure: ABDOMINAL AORTOGRAM W/LOWER EXTREMITY;  Surgeon: Nada Libman, MD;  Location: [facility];  Service: Cardiovascular;  Laterality: Bilateral;   AORTIC ARCH ANGIOGRAPHY N/A 12/02/2018   Procedure: AORTIC ARCH ANGIOGRAPHY;  Surgeon: Nada Libman, MD;  [facility]: [facility];   Service: Cardiovascular;  Laterality: N/A;   ENDOMETRIAL BIOPSY  2005   FEMORAL ARTERY STENT Bilateral 2008   LOWER EXTREMITY ANGIOGRAPHY Bilateral 07/16/2017   Procedure: Lower Extremity Angiography;  Surgeon: Nada Libman, MD;  Location: [facility];  Service: Cardiovascular;  Laterality: Bilateral;   NASAL SINUS SURGERY     PERIPHERAL VASCULAR BALLOON ANGIOPLASTY Bilateral 07/16/2017   Procedure: PERIPHERAL VASCULAR BALLOON ANGIOPLASTY;  Surgeon: Nada Libman, MD;  Location: [facility];  Service: Cardiovascular;  Laterality: Bilateral;  iliacs   PERIPHERAL VASCULAR INTERVENTION Bilateral 12/02/2018   Procedure: PERIPHERAL VASCULAR INTERVENTION;  Surgeon: Nada Libman, MD;  Location: [facility];  Service: Cardiovascular;  Laterality: Bilateral;   TONSILLECTOMY     TUBAL LIGATION      Social History   Socioeconomic History   Marital status: Married    Spouse name: Not on file   Number of children: 1   Years of education: Not on file   Highest education level: Not on file  Occupational History   Not on file  Tobacco Use   Smoking status: Former    Packs/day: 0.50    Years: 35.00    Pack years: 17.50    Types: Cigarettes    Quit date: 12/18/2017    Years since quitting: 2.7   Smokeless tobacco: Never   Tobacco comments:    As per pt - smoking 1/2 pk daily  Vaping Use   Vaping Use: Never used  Substance and Sexual Activity   Alcohol use: No   Drug use: No   Sexual activity: Yes  Other Topics Concern   Not on file  Social History Narrative   Not on file   Social Determinants of Health   Financial Resource Strain: Not on file  Food Insecurity: Not on file  Transportation Needs: Not on file  Physical Activity: Not on file  Stress: Not on file  Social Connections: Not on file  Intimate Partner Violence: Not on file    Family History  Problem Relation Age of Onset   Hypertension Mother    Hypertension Father    Heart disease  Father    Peripheral vascular disease Father    Alcohol abuse Father    Fibroids Sister    Cancer Maternal Grandmother        BREAST   Stroke Maternal Grandmother        CEBERAL ATHEROSCLEROSIS   Cancer - Ovarian Cousin     ROS: no fevers or chills, productive cough, hemoptysis, dysphasia, odynophagia, melena, hematochezia, dysuria, hematuria, rash, seizure activity, orthopnea, PND, pedal edema, claudication. Remaining systems are negative.  Physical Exam:   Blood pressure (!) 152/80, pulse 86, height 5\' 5"  (1.651 m), weight 178 lb 1.9 oz (80.8 kg), SpO2 99 %.  General:  Well developed/well nourished in NAD Skin warm/dry Patient not depressed No peripheral clubbing Back-normal HEENT-normal/normal eyelids Neck supple/normal carotid upstroke bilaterally; no bruits; no  JVD; no thyromegaly chest - CTA/ normal expansion CV - RRR/normal S1 and S2; no murmurs, rubs or gallops;  PMI nondisplaced Abdomen -NT/ND, no HSM, no mass, + bowel sounds, no bruit 2+ femoral pulses, no bruits Ext-no edema, chords, 2+ DP Neuro-grossly nonfocal  ECG -normal sinus rhythm at a rate of 86, no ST changes.  Personally reviewed  A/P  1 chest pain-symptoms atypical but she has multiple risk factors and also has a history of significant vascular disease.  I will arrange a Lexiscan nuclear study for risk stratification.  2 carotid artery disease-followed by vascular surgery.  She is due for follow-up carotid Dopplers.  3 peripheral vascular disease-followed by vascular surgery.  4 hyperlipidemia-given vascular disease increase Lipitor to 80 mg daily.  Check lipids and liver in 12 weeks.  5 hypertension-blood pressure elevated.  Add amlodipine 5 mg daily.  Follow blood pressure and advance regimen as needed.  6 tobacco abuse-patient previously discontinued. Olga Millers, MD

## 2020-09-03 LAB — CBC
HCT: 35 % (ref 35.0–45.0)
Hemoglobin: 11.4 g/dL — ABNORMAL LOW (ref 11.7–15.5)
MCH: 27.9 pg (ref 27.0–33.0)
MCHC: 32.6 g/dL (ref 32.0–36.0)
MCV: 85.8 fL (ref 80.0–100.0)
MPV: 9.5 fL (ref 7.5–12.5)
Platelets: 343 10*3/uL (ref 140–400)
RBC: 4.08 10*6/uL (ref 3.80–5.10)
RDW: 14 % (ref 11.0–15.0)
WBC: 6.6 10*3/uL (ref 3.8–10.8)

## 2020-09-03 LAB — LIPID PANEL
Cholesterol: 173 mg/dL (ref <200)
HDL: 38 mg/dL — ABNORMAL LOW (ref >=50)
LDL Cholesterol (Calc): 90 mg/dL (calc)
Non-HDL Cholesterol (Calc): 135 mg/dL (calc) — ABNORMAL HIGH (ref <130)
Total CHOL/HDL Ratio: 4.6 (calc) (ref <5.0)
Triglycerides: 341 mg/dL — ABNORMAL HIGH (ref <150)

## 2020-09-03 LAB — COMPLETE METABOLIC PANEL WITH GFR
AG Ratio: 1.9 (calc) (ref 1.0–2.5)
ALT: 18 U/L (ref 6–29)
AST: 23 U/L (ref 10–35)
Albumin: 4 g/dL (ref 3.6–5.1)
Alkaline phosphatase (APISO): 102 U/L (ref 37–153)
BUN: 17 mg/dL (ref 7–25)
CO2: 27 mmol/L (ref 20–32)
Calcium: 9.1 mg/dL (ref 8.6–10.4)
Chloride: 106 mmol/L (ref 98–110)
Creat: 0.86 mg/dL (ref 0.50–1.05)
GFR, Est African American: 86 mL/min/{1.73_m2} (ref >=60)
GFR, Est Non African American: 74 mL/min/{1.73_m2} (ref >=60)
Globulin: 2.1 g/dL (calc) (ref 1.9–3.7)
Glucose, Bld: 89 mg/dL (ref 65–99)
Potassium: 4.5 mmol/L (ref 3.5–5.3)
Sodium: 142 mmol/L (ref 135–146)
Total Bilirubin: 0.3 mg/dL (ref 0.2–1.2)
Total Protein: 6.1 g/dL (ref 6.1–8.1)

## 2020-09-03 LAB — TSH: TSH: 2.58 mIU/L (ref 0.40–4.50)

## 2020-09-07 ENCOUNTER — Encounter: Payer: Self-pay | Admitting: Cardiology

## 2020-09-07 ENCOUNTER — Other Ambulatory Visit: Payer: Self-pay

## 2020-09-07 ENCOUNTER — Encounter: Payer: Self-pay | Admitting: *Deleted

## 2020-09-07 ENCOUNTER — Ambulatory Visit (INDEPENDENT_AMBULATORY_CARE_PROVIDER_SITE_OTHER): Payer: Federal, State, Local not specified - PPO | Admitting: Cardiology

## 2020-09-07 VITALS — BP 152/80 | HR 86 | Ht 65.0 in | Wt 178.1 lb

## 2020-09-07 DIAGNOSIS — E7800 Pure hypercholesterolemia, unspecified: Secondary | ICD-10-CM | POA: Diagnosis not present

## 2020-09-07 DIAGNOSIS — I10 Essential (primary) hypertension: Secondary | ICD-10-CM

## 2020-09-07 DIAGNOSIS — R072 Precordial pain: Secondary | ICD-10-CM | POA: Diagnosis not present

## 2020-09-07 MED ORDER — ATORVASTATIN CALCIUM 80 MG PO TABS: 80.0000 mg | ORAL_TABLET | Freq: Every day | ORAL | 3 refills | Status: DC

## 2020-09-07 MED ORDER — AMLODIPINE BESYLATE 5 MG PO TABS
5.0000 mg | ORAL_TABLET | Freq: Every day | ORAL | 3 refills | Status: DC
Start: 2020-09-07 — End: 2021-02-13

## 2020-09-07 NOTE — Patient Instructions (Signed)
Medication Instructions:   INCREASE ATORVASTATIN TO 80 MG ONCE DAILY= 2 OF THE 40 MG TABLETS ONCE DAILY  START AMLODIPINE 5 MG ONCE DAILY  *If you need a refill on your cardiac medications before your next appointment, please call your pharmacy*   Lab Work:  Your physician recommends that you return for lab work in 3 MONTHS=FASTING  If you have labs (blood work) drawn today and your tests are completely normal, you will receive your results only by: MyChart Message (if you have MyChart) OR A paper copy in the mail If you have any lab test that is abnormal or we need to change your treatment, we will call you to review the results.   Testing/Procedures:  Your physician has requested that you have a lexiscan myoview. For further information please visit [email or web address] Please follow instruction sheet, as given. [address]-[place]   Follow-Up: At [facility], you and your health needs are our priority.  As part of our continuing mission to provide you with exceptional heart care, we have created designated Provider Care Teams.  These Care Teams include your primary Cardiologist (physician) and Advanced Practice Providers (APPs -  Physician Assistants and Nurse Practitioners) who all work together to provide you with the care you need, when you need it.  We recommend signing up for the patient portal called "MyChart".  Sign up information is provided on this After Visit Summary.  MyChart is used to connect with patients for Virtual Visits (Telemedicine).  Patients are able to view lab/test results, encounter notes, upcoming appointments, etc.  Non-urgent messages can be sent to your provider as well.   To learn more about what you can do with MyChart, go to [email or web address].    Your next appointment:   6 month(s)  The format for your next appointment:   In Person  Provider:   Olga Millers, MD

## 2020-09-12 DIAGNOSIS — R140 Abdominal distension (gaseous): Secondary | ICD-10-CM | POA: Diagnosis not present

## 2020-09-12 DIAGNOSIS — R1013 Epigastric pain: Secondary | ICD-10-CM | POA: Diagnosis not present

## 2020-09-12 DIAGNOSIS — R1310 Dysphagia, unspecified: Secondary | ICD-10-CM | POA: Diagnosis not present

## 2020-09-12 DIAGNOSIS — R109 Unspecified abdominal pain: Secondary | ICD-10-CM | POA: Diagnosis not present

## 2020-09-16 ENCOUNTER — Other Ambulatory Visit: Payer: Self-pay | Admitting: *Deleted

## 2020-09-16 DIAGNOSIS — R140 Abdominal distension (gaseous): Secondary | ICD-10-CM | POA: Diagnosis not present

## 2020-09-16 DIAGNOSIS — I6523 Occlusion and stenosis of bilateral carotid arteries: Secondary | ICD-10-CM

## 2020-09-16 DIAGNOSIS — R1310 Dysphagia, unspecified: Secondary | ICD-10-CM | POA: Diagnosis not present

## 2020-09-16 DIAGNOSIS — I739 Peripheral vascular disease, unspecified: Secondary | ICD-10-CM

## 2020-09-16 DIAGNOSIS — I70213 Atherosclerosis of native arteries of extremities with intermittent claudication, bilateral legs: Secondary | ICD-10-CM

## 2020-09-16 DIAGNOSIS — I779 Disorder of arteries and arterioles, unspecified: Secondary | ICD-10-CM

## 2020-09-16 DIAGNOSIS — K219 Gastro-esophageal reflux disease without esophagitis: Secondary | ICD-10-CM | POA: Diagnosis not present

## 2020-09-16 DIAGNOSIS — R109 Unspecified abdominal pain: Secondary | ICD-10-CM | POA: Diagnosis not present

## 2020-09-16 DIAGNOSIS — R1013 Epigastric pain: Secondary | ICD-10-CM | POA: Diagnosis not present

## 2020-09-21 ENCOUNTER — Telehealth (HOSPITAL_COMMUNITY): Payer: Self-pay

## 2020-09-21 NOTE — Telephone Encounter (Signed)
Spoke with the patient, detailed instructions given. She stated that she would be here for her test. Asked to call back with any questions. S.Madex Seals EMTP 

## 2020-09-22 DIAGNOSIS — R1013 Epigastric pain: Secondary | ICD-10-CM | POA: Diagnosis not present

## 2020-09-22 DIAGNOSIS — K219 Gastro-esophageal reflux disease without esophagitis: Secondary | ICD-10-CM | POA: Diagnosis not present

## 2020-09-22 DIAGNOSIS — K760 Fatty (change of) liver, not elsewhere classified: Secondary | ICD-10-CM | POA: Diagnosis not present

## 2020-09-22 DIAGNOSIS — R109 Unspecified abdominal pain: Secondary | ICD-10-CM | POA: Diagnosis not present

## 2020-09-22 DIAGNOSIS — R140 Abdominal distension (gaseous): Secondary | ICD-10-CM | POA: Diagnosis not present

## 2020-09-22 DIAGNOSIS — R1310 Dysphagia, unspecified: Secondary | ICD-10-CM | POA: Diagnosis not present

## 2020-09-24 ENCOUNTER — Other Ambulatory Visit: Payer: Self-pay | Admitting: Osteopathic Medicine

## 2020-09-26 ENCOUNTER — Ambulatory Visit (INDEPENDENT_AMBULATORY_CARE_PROVIDER_SITE_OTHER)
Admission: RE | Admit: 2020-09-26 | Discharge: 2020-09-26 | Disposition: A | Discharge status: Home / Self Care | Payer: Federal, State, Local not specified - PPO | Source: Ambulatory Visit | Attending: Surgery | Admitting: Surgery

## 2020-09-26 ENCOUNTER — Ambulatory Visit (HOSPITAL_COMMUNITY)
Admission: RE | Admit: 2020-09-26 | Discharge: 2020-09-26 | Disposition: A | Discharge status: Home / Self Care | Payer: Federal, State, Local not specified - PPO | Source: Ambulatory Visit | Attending: Surgery | Admitting: Surgery

## 2020-09-26 ENCOUNTER — Other Ambulatory Visit: Payer: Self-pay

## 2020-09-26 ENCOUNTER — Ambulatory Visit: Payer: Federal, State, Local not specified - PPO | Admitting: Physician Assistant

## 2020-09-26 VITALS — BP 140/85 | HR 86 | Temp 97.6°F | Resp 14 | Ht 65.0 in | Wt 179.0 lb

## 2020-09-26 DIAGNOSIS — I6523 Occlusion and stenosis of bilateral carotid arteries: Secondary | ICD-10-CM

## 2020-09-26 DIAGNOSIS — I70213 Atherosclerosis of native arteries of extremities with intermittent claudication, bilateral legs: Secondary | ICD-10-CM

## 2020-09-26 DIAGNOSIS — I739 Peripheral vascular disease, unspecified: Secondary | ICD-10-CM | POA: Diagnosis not present

## 2020-09-26 DIAGNOSIS — I779 Disorder of arteries and arterioles, unspecified: Secondary | ICD-10-CM | POA: Insufficient documentation

## 2020-09-26 DIAGNOSIS — I708 Atherosclerosis of other arteries: Secondary | ICD-10-CM | POA: Diagnosis not present

## 2020-09-26 NOTE — Progress Notes (Signed)
Carotid Artery Follow-Up   VASCULAR SURGERY ASSESSMENT & PLAN:   Tina Navarro is a 60 y.o. female with a known history of left subclavian artery stenosis.  She has worsening symptoms of left upper extremity claudication and her case was reviewed with Dr. Myra Gianotti.  He discussed with the patient the likelihood of needing left carotid artery to left subclavian bypass.  We will obtain CTA of the neck and she will follow-up with Dr. Myra Gianotti in his clinic in 1 to 2 weeks. Patient reminded of BP checks in right arm only.  Bilateral carotid artery stenosis: The patient has no symptoms referable to carotid artery stenosis.  Duplex examination today is stable as compared to 1 year ago.  We reviewed the signs and symptoms of stroke/TIA and advised the patient to call EMS should these occur.    PAD:  Bilateral common iliac artery stents are patent without evidence of  significant stenosis. Palpable DP pulses.  Continue optimal medical management of diabetes, hypertension and follow-up with primary care physician. Encouraged continuation of complete smoking cessation. Continue the following medications: statin, Plavix, aspirin Follow-up in 1 year with carotid duplex ultrasound, bilateral ABIs, aortoiliac duplex.  SUBJECTIVE:   She describes mild worsening of left upper extremity claudication symptoms.  She states she must now rest her arm 2-3 times when washing her hair.  She has noticed some numbness at nighttime.  She is also describing mil left upper extremity weakness.  The patient denies monocular blindness, slurred speech, facial drooping, extremity weakness or numbness. She denies lower extremity claudication or rest pain.  PHYSICAL EXAM:   Vitals:   09/26/20 0956  BP: 140/85  Pulse: 86  Resp: 14  Temp: 97.6 F (36.4 C)  SpO2: 99%     General appearance: Well-developed, well-nourished in no apparent distress Neurologic: Alert and oriented x4, face symmetric, speech fluent, 5 out of 5  bilateral upper extremity grip strength, triceps and biceps strength Cardiovascular: Heart rate and rhythm are regular.  2+ right radial pulse, weakly palpable left radial pulse.  Pedal pulses are palpable.  No carotid bruits. Respirations: Nonlabored  NON-INVASIVE VASCULAR STUDIES  09/26/2020   Right Carotid: Velocities in the right ICA are consistent with a 40-59%                 stenosis. The ECA appears >50% stenosed.   Left Carotid: Velocities in the left ICA are consistent with a 1-39%  stenosis.   Vertebrals:  Right vertebral artery demonstrates antegrade flow. Left  vertebral artery was not visualized.   Subclavians: Left subclavian artery was occluded. Normal flow hemodynamics  were seen in the right subclavian artery.   Bilateral common iliac artery stents are patent without evidence of  significant stenosis.   ABI/TBIToday's ABIToday's TBIPrevious ABIPrevious TBI  +-------+-----------+-----------+------------+------------+  Right  1.04       0.88       1.06        0.74          +-------+-----------+-----------+------------+------------+  Left   1.04       0.83       0.97        0.82          +-------+-----------+-----------+------------+------------+  Waveforms are triphasic bilaterally     PROBLEM LIST:    The patient's past medical history, past surgical history, family history, social history, allergy list and medication list are reviewed.   CURRENT MEDS:    Current Outpatient Medications:    albuterol (VENTOLIN  HFA) 108 (90 Base) MCG/ACT inhaler, Inhale 1-2 puffs into the lungs every 4 (four) hours as needed for wheezing or shortness of breath., Disp: 1 each, Rfl: 2   amLODipine (NORVASC) 5 MG tablet, Take 1 tablet (5 mg total) by mouth daily., Disp: 180 tablet, Rfl: 3   Aspirin-Salicylamide-Caffeine (BC HEADACHE POWDER PO), Take 1 packet by mouth daily as needed (for headache). , Disp: , Rfl:    atorvastatin (LIPITOR) 80 MG tablet, Take 1  tablet (80 mg total) by mouth at bedtime., Disp: 90 tablet, Rfl: 3   Cholecalciferol (VITAMIN D3 PO), Take 4,000 mg by mouth at bedtime., Disp: , Rfl:    clobetasol ointment (TEMOVATE) 0.05 %, APPLY TO THE AFFECTED AREA(S) TOPICALLY TWICE DAILY AS NEEDED FOR A MAX OF TWO WEEKS TO AVOID WHITENING/THINNING SKIN., Disp: 15 g, Rfl: 1   clopidogrel (PLAVIX) 75 MG tablet, Take 1 tablet (75 mg total) by mouth daily., Disp: 90 tablet, Rfl: 3   diclofenac sodium (VOLTAREN) 1 % GEL, Apply 4 g topically 4 (four) times daily. To affected joint., Disp: 100 g, Rfl: 11   estradiol (ESTRACE) 0.1 MG/GM vaginal cream, Place 1 Applicatorful vaginally 3 (three) times a week., Disp: 42.5 g, Rfl: 12   losartan (COZAAR) 100 MG tablet, Take 1 tablet (100 mg total) by mouth daily., Disp: 90 tablet, Rfl: 3   metoprolol succinate (TOPROL-XL) 50 MG 24 hr tablet, Take 1 tablet (50 mg total) by mouth daily. (Patient not taking: Reported on 09/07/2020), Disp: 90 tablet, Rfl: 3   omeprazole (PRILOSEC) 40 MG capsule, Take 1 capsule (40 mg total) by mouth daily., Disp: 90 capsule, Rfl: 3   predniSONE (DELTASONE) 20 MG tablet, Take 1 tablet (20 mg total) by mouth 2 (two) times daily with a meal. (Patient not taking: Reported on 09/07/2020), Disp: 10 tablet, Rfl: 0   promethazine (PHENERGAN) 12.5 MG tablet, Take 1 tablet (12.5 mg total) by mouth every 4 (four) hours as needed for nausea., Disp: 30 tablet, Rfl: 1   senna (SENOKOT) 8.6 MG tablet, Take 2 tablets (17.2 mg total) by mouth daily. As needed for constipation (Patient taking differently: Take 2 tablets by mouth daily as needed for constipation.), Disp: 90 tablet, Rfl: 1   vitamin B-12 (CYANOCOBALAMIN) 1000 MCG tablet, Take 1,000 mcg by mouth daily. , Disp: , Rfl:    REVIEW OF SYSTEMS:   [X]  denotes positive finding, [ ]  denotes negative finding Cardiac  Comments:  Chest pain or chest pressure:    Shortness of breath upon exertion:    Short of breath when lying flat:     Irregular heart rhythm:        Vascular    Pain in calf, thigh, or hip brought on by ambulation:    Pain in feet at night that wakes you up from your sleep:     Blood clot in your veins:    Leg swelling:         Pulmonary    Oxygen at home:    Productive cough:     Wheezing:         Neurologic    Sudden weakness in arms or legs:     Sudden numbness in arms or legs:     Sudden onset of difficulty speaking or slurred speech:    Temporary loss of vision in one eye:     Problems with dizziness:         Gastrointestinal    Blood in stool:  Vomited blood:         Genitourinary    Burning when urinating:     Blood in urine:        Psychiatric    Major depression:         Hematologic    Bleeding problems:    Problems with blood clotting too easily:        Skin    Rashes or ulcers:        Constitutional    Fever or chills:     Milinda Antis, PA-C  Office: [phone] 09/26/2020 Clinic MD: Myra Gianotti

## 2020-09-27 ENCOUNTER — Ambulatory Visit (HOSPITAL_COMMUNITY): Payer: Federal, State, Local not specified - PPO | Attending: Cardiovascular Disease

## 2020-09-27 DIAGNOSIS — R072 Precordial pain: Secondary | ICD-10-CM | POA: Diagnosis not present

## 2020-09-27 LAB — MYOCARDIAL PERFUSION IMAGING
LV dias vol: 36 mL (ref 46–106)
LV sys vol: 8 mL
Peak HR: 95 {beats}/min
Rest HR: 80 {beats}/min
SDS: 0
SRS: 0
SSS: 0
TID: 0.97

## 2020-09-27 MED ORDER — REGADENOSON 0.4 MG/5ML IV SOLN
0.4000 mg | Freq: Once | INTRAVENOUS | Status: AC
  Administered 2020-09-27: 0.4 mg via INTRAVENOUS

## 2020-09-27 MED ORDER — TECHNETIUM TC 99M TETROFOSMIN IV KIT
30.9000 | PACK | Freq: Once | INTRAVENOUS | Status: AC | PRN
  Administered 2020-09-27: 30.9 via INTRAVENOUS
  Filled 2020-09-27: qty 31

## 2020-09-27 MED ORDER — TECHNETIUM TC 99M TETROFOSMIN IV KIT
10.2000 | PACK | Freq: Once | INTRAVENOUS | Status: AC | PRN
  Administered 2020-09-27: 10.2 via INTRAVENOUS
  Filled 2020-09-27: qty 11

## 2020-09-28 ENCOUNTER — Other Ambulatory Visit: Payer: Self-pay

## 2020-09-28 DIAGNOSIS — I6523 Occlusion and stenosis of bilateral carotid arteries: Secondary | ICD-10-CM

## 2020-09-28 DIAGNOSIS — E042 Nontoxic multinodular goiter: Secondary | ICD-10-CM | POA: Diagnosis not present

## 2020-10-03 DIAGNOSIS — K8050 Calculus of bile duct without cholangitis or cholecystitis without obstruction: Secondary | ICD-10-CM | POA: Diagnosis not present

## 2020-10-04 ENCOUNTER — Ambulatory Visit (HOSPITAL_BASED_OUTPATIENT_CLINIC_OR_DEPARTMENT_OTHER)
Admission: RE | Admit: 2020-10-04 | Discharge: 2020-10-04 | Disposition: A | Discharge status: Home / Self Care | Payer: Federal, State, Local not specified - PPO | Source: Ambulatory Visit | Attending: Surgery | Admitting: Surgery

## 2020-10-04 ENCOUNTER — Other Ambulatory Visit: Payer: Self-pay

## 2020-10-04 ENCOUNTER — Ambulatory Visit (INDEPENDENT_AMBULATORY_CARE_PROVIDER_SITE_OTHER): Payer: Federal, State, Local not specified - PPO | Admitting: Osteopathic Medicine

## 2020-10-04 ENCOUNTER — Encounter (HOSPITAL_BASED_OUTPATIENT_CLINIC_OR_DEPARTMENT_OTHER): Payer: Self-pay

## 2020-10-04 VITALS — BP 135/70 | HR 92 | Temp 97.8°F | Wt 180.0 lb

## 2020-10-04 DIAGNOSIS — K828 Other specified diseases of gallbladder: Secondary | ICD-10-CM

## 2020-10-04 DIAGNOSIS — I771 Stricture of artery: Secondary | ICD-10-CM | POA: Diagnosis not present

## 2020-10-04 DIAGNOSIS — I6523 Occlusion and stenosis of bilateral carotid arteries: Secondary | ICD-10-CM | POA: Insufficient documentation

## 2020-10-04 DIAGNOSIS — K219 Gastro-esophageal reflux disease without esophagitis: Secondary | ICD-10-CM | POA: Diagnosis not present

## 2020-10-04 DIAGNOSIS — I6522 Occlusion and stenosis of left carotid artery: Secondary | ICD-10-CM | POA: Diagnosis not present

## 2020-10-04 DIAGNOSIS — I6521 Occlusion and stenosis of right carotid artery: Secondary | ICD-10-CM | POA: Diagnosis not present

## 2020-10-04 MED ORDER — IOHEXOL 350 MG/ML SOLN
75.0000 mL | Freq: Once | INTRAVENOUS | Status: AC | PRN
  Administered 2020-10-04: 75 mL via INTRAVENOUS

## 2020-10-04 NOTE — Patient Instructions (Addendum)
I'll send a note to Dr Jason Fila (GI) to see if he has any input here I'd prioritize the vascular surgery w/ Dr Myra Gianotti over anything with the gallbladder Will see what Dr Jason Fila says - may refer to different general surgeon or recommend other testing

## 2020-10-04 NOTE — Progress Notes (Signed)
Tina Navarro is a 60 y.o. female who presents to  [facility] at [facility]  today, 10/04/20, seeking care for the following:  Concerns about recent visit w/ surgeon - they wanted to perform cholecystectomy for biliary colic, she has EGD upcoming, not convinced she needs GB out, she would like to d/w GI first, wanted my opinion on it, aalso since she has proceudre coming up w/ vascular surgery     ASSESSMENT & PLAN with other pertinent findings:  The primary encounter diagnosis was Adenomyosis, gallbladder. A diagnosis of Gastroesophageal reflux disease, unspecified whether esophagitis present was also pertinent to this visit.    Patient Instructions  I'll send a note to Dr Jason Fila (GI) to see if he has any input here I'd prioritize the vascular surgery w/ Dr Myra Gianotti over anything with the gallbladder Will see what Dr Jason Fila says - may refer to different general surgeon or recommend other testing    No orders of the defined types were placed in this encounter.   No orders of the defined types were placed in this encounter.    See below for relevant physical exam findings  See below for recent lab and imaging results reviewed  Medications, allergies, PMH, PSH, SocH, FamH reviewed below    Follow-up instructions: Return if symptoms worsen.                                        Exam:  BP 135/70 (BP Location: Left Arm, Patient Position: Sitting, Cuff Size: Normal)   Pulse 92   Temp 97.8 F (36.6 C) (Oral)   Wt 180 lb (81.6 kg)   LMP  (LMP Unknown)   BMI 29.95 kg/m  Constitutional: VS see above. General Appearance: alert, well-developed, well-nourished, NAD Neck: No masses, trachea midline.  Respiratory: Normal respiratory effort.  Musculoskeletal: Gait normal. Symmetric and independent movement of all extremities Neurological: Normal balance/coordination. No tremor. Skin: warm, dry, intact.   Psychiatric: Normal judgment/insight. Normal mood and affect. Oriented x3.   Current Meds  Medication Sig   albuterol (VENTOLIN HFA) 108 (90 Base) MCG/ACT inhaler Inhale 1-2 puffs into the lungs every 4 (four) hours as needed for wheezing or shortness of breath.   amLODipine (NORVASC) 5 MG tablet Take 1 tablet (5 mg total) by mouth daily.   Aspirin-Salicylamide-Caffeine (BC HEADACHE POWDER PO) Take 1 packet by mouth daily as needed (for headache).    atorvastatin (LIPITOR) 80 MG tablet Take 1 tablet (80 mg total) by mouth at bedtime.   Cholecalciferol (VITAMIN D3 PO) Take 4,000 mg by mouth at bedtime.   clobetasol ointment (TEMOVATE) 0.05 % APPLY TO THE AFFECTED AREA(S) TOPICALLY TWICE DAILY AS NEEDED FOR A MAX OF TWO WEEKS TO AVOID WHITENING/THINNING SKIN.   clopidogrel (PLAVIX) 75 MG tablet Take 1 tablet (75 mg total) by mouth daily.   diclofenac sodium (VOLTAREN) 1 % GEL Apply 4 g topically 4 (four) times daily. To affected joint.   estradiol (ESTRACE) 0.1 MG/GM vaginal cream Place 1 Applicatorful vaginally 3 (three) times a week.   losartan (COZAAR) 100 MG tablet Take 1 tablet (100 mg total) by mouth daily.   lubiprostone (AMITIZA) 8 MCG capsule Take by mouth.   omeprazole (PRILOSEC) 40 MG capsule Take 1 capsule (40 mg total) by mouth daily.   promethazine (PHENERGAN) 12.5 MG tablet Take 1 tablet (12.5 mg total) by mouth every  4 (four) hours as needed for nausea.   vitamin B-12 (CYANOCOBALAMIN) 1000 MCG tablet Take 1,000 mcg by mouth daily.     Allergies  Allergen Reactions   Doxycycline Hives, Rash and Other (See Comments)    Pain, photosensitive    Patient Active Problem List   Diagnosis Date Noted   Anxiety state 12/25/2017   Irritant contact dermatitis due to drug in contact with skin 12/25/2017   Thyroid nodule 04/23/2017   Emphysematous COPD (HCC) 04/23/2017   Rib pain on left side 04/23/2017   Neuropathy involving both lower extremities 04/22/2017   Pleuritic chest pain  04/22/2017   Essential hypertension 05/01/2016   Abnormal findings on esophagogastroduodenoscopy (EGD) 03/18/2016   BPPV (benign paroxysmal positional vertigo) 06/28/2015   Irritable bowel syndrome 04/01/2015   Dyslipidemia, goal LDL below 70 04/01/2015   Current tobacco use 07/28/2012   Multinodular goiter 12/19/2011   CN (constipation) 07/28/2011   Dyslipidemia 07/28/2011   Acid reflux 09/25/2006   Peripheral vascular disease (HCC) 09/25/2006    Family History  Problem Relation Age of Onset   Hypertension Mother    Hypertension Father    Heart disease Father    Peripheral vascular disease Father    Alcohol abuse Father    Fibroids Sister    Cancer Maternal Grandmother        BREAST   Stroke Maternal Grandmother        CEBERAL ATHEROSCLEROSIS   Cancer - Ovarian Cousin     Social History   Tobacco Use  Smoking Status Former   Packs/day: 0.50   Years: 35.00   Pack years: 17.50   Types: Cigarettes   Quit date: 12/18/2017   Years since quitting: 2.7  Smokeless Tobacco Never  Tobacco Comments   As per pt - smoking 1/2 pk daily    Past Surgical History:  Procedure Laterality Date   ABDOMINAL AORTOGRAM N/A 07/16/2017   Procedure: ABDOMINAL AORTOGRAM;  Surgeon: Nada Libman, MD;  Location: [facility];  Service: Cardiovascular;  Laterality: N/A;   ABDOMINAL AORTOGRAM W/LOWER EXTREMITY Bilateral 12/02/2018   Procedure: ABDOMINAL AORTOGRAM W/LOWER EXTREMITY;  Surgeon: Nada Libman, MD;  Location: [facility];  Service: Cardiovascular;  Laterality: Bilateral;   AORTIC ARCH ANGIOGRAPHY N/A 12/02/2018   Procedure: AORTIC ARCH ANGIOGRAPHY;  Surgeon: Nada Libman, MD;  Location: [facility];  Service: Cardiovascular;  Laterality: N/A;   ENDOMETRIAL BIOPSY  2005   FEMORAL ARTERY STENT Bilateral 2008   LOWER EXTREMITY ANGIOGRAPHY Bilateral 07/16/2017   Procedure: Lower Extremity Angiography;  Surgeon: Nada Libman, MD;  Location: [facility];  Service: Cardiovascular;  Laterality: Bilateral;   NASAL SINUS SURGERY     PERIPHERAL VASCULAR BALLOON ANGIOPLASTY Bilateral 07/16/2017   Procedure: PERIPHERAL VASCULAR BALLOON ANGIOPLASTY;  Surgeon: Nada Libman, MD;  Location: [facility];  Service: Cardiovascular;  Laterality: Bilateral;  iliacs   PERIPHERAL VASCULAR INTERVENTION Bilateral 12/02/2018   Procedure: PERIPHERAL VASCULAR INTERVENTION;  Surgeon: Nada Libman, MD;  Location: [facility];  Service: Cardiovascular;  Laterality: Bilateral;   TONSILLECTOMY     TUBAL LIGATION      Immunization History  Administered Date(s) Administered   Influenza,inj,Quad PF,6+ Mos 12/08/2018   Moderna Sars-Covid-2 Vaccination 05/18/2019, 06/15/2019, 01/25/2020   Tdap 12/11/2005    Recent Results (from the past 2160 hour(s))  CBC     Status: Abnormal   Collection Time: 09/02/20 12:00 AM  Result Value Ref Range  WBC 6.6 3.8 - 10.8 Thousand/uL   RBC 4.08 3.80 - 5.10 Million/uL   Hemoglobin 11.4 (L) 11.7 - 15.5 g/dL   HCT 09.835.0 11.935.0 - 14.745.0 %   MCV 85.8 80.0 - 100.0 fL   MCH 27.9 27.0 - 33.0 pg   MCHC 32.6 32.0 - 36.0 g/dL   RDW 82.914.0 56.211.0 - 13.015.0 %   Platelets 343 140 - 400 Thousand/uL   MPV 9.5 7.5 - 12.5 fL  COMPLETE METABOLIC PANEL WITH GFR     Status: None   Collection Time: 09/02/20 12:00 AM  Result Value Ref Range   Glucose, Bld 89 65 - 99 mg/dL    Comment: .            Fasting reference interval .    BUN 17 7 - 25 mg/dL   Creat 8.650.86 7.840.50 - 6.961.05 mg/dL    Comment: For patients >60 years of age, the reference limit for Creatinine is approximately 13% higher for people identified as African-American. .    GFR, Est Non African American 74 > OR = 60 mL/min/1.5173m2   GFR, Est African American 86 > OR = 60 mL/min/1.9573m2   BUN/Creatinine Ratio NOT APPLICABLE 6 - 22 (calc)   Sodium 142 135 - 146 mmol/L   Potassium 4.5 3.5 - 5.3 mmol/L   Chloride 106 98 - 110 mmol/L   CO2 27 20 - 32 mmol/L   Calcium 9.1  8.6 - 10.4 mg/dL   Total Protein 6.1 6.1 - 8.1 g/dL   Albumin 4.0 3.6 - 5.1 g/dL   Globulin 2.1 1.9 - 3.7 g/dL (calc)   AG Ratio 1.9 1.0 - 2.5 (calc)   Total Bilirubin 0.3 0.2 - 1.2 mg/dL   Alkaline phosphatase (APISO) 102 37 - 153 U/L   AST 23 10 - 35 U/L   ALT 18 6 - 29 U/L  Lipid panel     Status: Abnormal   Collection Time: 09/02/20 12:00 AM  Result Value Ref Range   Cholesterol 173 <200 mg/dL   HDL 38 (L) > OR = 50 mg/dL   Triglycerides 295341 (H) <150 mg/dL    Comment: . If a non-fasting specimen was collected, consider repeat triglyceride testing on a fasting specimen if clinically indicated.  Perry MountJacobson et al. J. of Clin. Lipidol. 2015;9:129-169. Marland Kitchen.    LDL Cholesterol (Calc) 90 mg/dL (calc)    Comment: Reference range: <100 . Desirable range <100 mg/dL for primary prevention;   <70 mg/dL for patients with CHD or diabetic patients  with > or = 2 CHD risk factors. Marland Kitchen. LDL-C is now calculated using the Martin-Hopkins  calculation, which is a validated novel method providing  better accuracy than the Friedewald equation in the  estimation of LDL-C.  Horald PollenMartin SS et al. Lenox AhrJAMA. 2841;324(402013;310(19): 2061-2068  (http://education.Quest[employer].com/faq/FAQ164)    Total CHOL/HDL Ratio 4.6 <5.0 (calc)   Non-HDL Cholesterol (Calc) 135 (H) <130 mg/dL (calc)    Comment: For patients with diabetes plus 1 major ASCVD risk  factor, treating to a non-HDL-C goal of <100 mg/dL  (LDL-C of <10<70 mg/dL) is considered a therapeutic  option.   TSH     Status: None   Collection Time: 09/02/20 12:00 AM  Result Value Ref Range   TSH 2.58 0.40 - 4.50 mIU/L  MYOCARDIAL PERFUSION IMAGING     Status: None   Collection Time: 09/27/20 10:27 AM  Result Value Ref Range   Rest HR 80 bpm   Rest BP 148/74 mmHg   Peak  HR 95 bpm   Peak BP 141/72 mmHg   SSS 0    SRS 0    SDS 0    TID 0.97    LV sys vol 8 mL   LV dias vol 36 46 - 106 mL    No results found.     All questions at time of visit were  answered - patient instructed to contact office with any additional concerns or updates. ER/RTC precautions were reviewed with the patient as applicable.   Please note: manual typing as well as voice recognition software may have been used to produce this document - typos may escape review. Please contact Dr. Lyn Hollingshead for any needed clarifications.   Total encounter time on date of service, 10/04/20, was 20 minutes spent addressing problems/issues as noted above in Assessment & Plan, including time spent in discussion with patient regarding the HPI, ROS, confirming history, reviewing Assessment & Plan, as well as time spent on coordination of care, record review.    Note faxed to  Konrad Penta, MD Gastroenterology [address] [place] [address]   Phone: [phone] Fax: [phone]

## 2020-10-06 ENCOUNTER — Telehealth: Payer: Self-pay | Admitting: Neurology

## 2020-10-06 ENCOUNTER — Telehealth: Payer: Self-pay | Admitting: Osteopathic Medicine

## 2020-10-06 NOTE — Telephone Encounter (Signed)
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 This would be at high risk of rupture. One could question a localized dissection adjacent to the base of that aneurysm.

## 2020-10-06 NOTE — Telephone Encounter (Signed)
Percell Belt with [facility] called.  She states that Vascular Surgeon's office, Dr. Myra Gianotti sent a referral to their office  Re:  CT on Aug 9th:Aneurysm in the Proximal Bosilar artery projecting at 41mm with a diameter of 2.37m.  Urgent referral needs to be sent over to [facility].  Pt may not be aware of this. Telephone contact for [facility] is [phone]

## 2020-10-06 NOTE — Telephone Encounter (Addendum)
Our new patient coordinator initially contacted the patient to schedule her appt here. The patient asked who was referring her to our office. Unclear if she has been provided with her CTA results yet. Mattie, from our referral team, requested our work-in neurologist, Dr. Terrace Arabia, to review the patient's chart. She advised the patient should be urgently seen by neurosurgery. Mattie reached out to [facility]. Spoke to [place] who stated she would fax over the urgent referral to [facility]. Percell Belt, from our referral team, called ahead to make them aware to expect the referral from Dr. Marnee Spring office. They said they would be able to get the patient in quickly w/ Dr. Lisbeth Renshaw. I also called Dr. Marnee Spring office and spoke to the triage RN, Vallarie Mare. Stated she will reach out to Dr. Earle Gell and ask him to call the patient to discuss results and precautions.

## 2020-10-06 NOTE — Telephone Encounter (Signed)
Please call Ariana back.   Dr Estanislado Spire office really needs to handle this since I am NOT the provider who ordered the test that showed an abnormal finding. Dr Myra Gianotti or his covering provider are responsible for alerting patient of results and plan.

## 2020-10-06 NOTE — Telephone Encounter (Signed)
I spoke with Seward Grater at [facility], I told her per Dr. Lyn Hollingshead, Dr. Estanislado Spire office needs to handle this since she is not the provider. Maggie said she will relay the message.

## 2020-10-06 NOTE — Telephone Encounter (Addendum)
Chart reviewed, patient was referred by vascular surgeon Dr.Brabham following abnormal CT angiogram October 04, 2020, neurology reported unusual worrisome aneurysm of the proximal basilar artery projecting 9 mm anterior with a diameter of about 2.5 mm  Please call the referring physicians office, and her primary care physician, she should be seen by neurosurgeon urgently, rather than the neurologist.  If patient has symptoms, such as headache, focal signs, she should go to emergency Immediately   Boettcher, Gavin Potters, MD Hi Dr. Terrace Arabia,   We received a referral for this patient regarding an aneurysm shown on CT scan. It was sent over as urgent due to high risk of rupture. I have her scheduled with Dr. Epimenio Foot to come in on Tuesday but I wonder if this should go straight to neurosurgery?   Thank you   CTA on August 9th IMPRESSION: 70% stenosis of the ICA bulb on the right.   70-75% stenosis of the ICA bulb on the left. 30-50% stenosis of the proximal/origin of the left common carotid artery.   Severe atherosclerotic disease affecting the proximal 2 cm of the left subclavian artery. Definite antegrade flow not established by this study, but there may be a thread of flow adjacent to the dense calcified plaque. Occlusion of the left vertebral artery at its origin with reconstitution in the lower cervical region and patency beyond that to the basilar. Dominant right vertebral artery widely patent.   Question second stenosis of the left subclavian artery at the level of the lung apex. This is not definite, as there is dense venous contrast adjacent to that which could cause artifact.   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 This would be at high risk of rupture. One could question a localized dissection adjacent to the base of that aneurysm.   Enlarged and heterogeneous thyroid gland, evaluated by  ultrasound in February of 2019, and not grossly changed by CT.   These results will be called to the ordering clinician or representative by the Radiologist Assistant, and communication documented in the PACS or [employer].

## 2020-10-10 ENCOUNTER — Other Ambulatory Visit: Payer: Self-pay

## 2020-10-10 ENCOUNTER — Encounter: Payer: Self-pay | Admitting: Surgery

## 2020-10-10 ENCOUNTER — Ambulatory Visit: Payer: Federal, State, Local not specified - PPO | Admitting: Surgery

## 2020-10-10 VITALS — BP 129/81 | HR 89 | Temp 97.9°F | Resp 20 | Ht 65.0 in | Wt 179.0 lb

## 2020-10-10 DIAGNOSIS — I708 Atherosclerosis of other arteries: Secondary | ICD-10-CM | POA: Diagnosis not present

## 2020-10-10 DIAGNOSIS — I671 Cerebral aneurysm, nonruptured: Secondary | ICD-10-CM | POA: Diagnosis not present

## 2020-10-10 NOTE — Progress Notes (Signed)
[facility] [place]  Patient name: Tina Navarro MRN: [id number] DOB: 09-08-1960 Sex: female   REASON FOR VISIT:    Follow up  HISOTRY OF PRESENT ILLNESS:    Tina Navarro is a 60 y.o. female who has a history of percutaneous intervention to her legs in 2007.  She transferred to the [facility] health system.  I performed angiography on 07/16/2017 and performed angioplasty of bilateral common iliac stenosis.  She had recurrent symptoms shortly thereafter and on 12/02/2018 I placed bilateral common iliac VBX stents (8 x 39).  She has had issues with left arm weakness while washing her hair.  She has a known left subclavian stenosis which was confirmed with angiography.  Her symptoms have been tolerable however they have progressed.  I sent her for CT scan to better evaluate this for surgical reconstruction.  She is back today to discuss these results.   PAST MEDICAL HISTORY:   Past Medical History:  Diagnosis Date   Emphysematous COPD (HCC) 04/23/2017   GERD (gastroesophageal reflux disease)    Hyperlipidemia    Hypertension    Peripheral arterial disease (HCC)    Thyroid nodule 04/23/2017     FAMILY HISTORY:   Family History  Problem Relation Age of Onset   Hypertension Mother    Hypertension Father    Heart disease Father    Peripheral vascular disease Father    Alcohol abuse Father    Fibroids Sister    Cancer Maternal Grandmother        BREAST   Stroke Maternal Grandmother        CEBERAL ATHEROSCLEROSIS   Cancer - Ovarian Cousin     SOCIAL HISTORY:   Social History   Tobacco Use   Smoking status: Former    Packs/day: 0.50    Years: 35.00    Pack years: 17.50    Types: Cigarettes    Quit date: 12/18/2017    Years since quitting: 2.8   Smokeless tobacco: Never   Tobacco comments:    As per pt - smoking 1/2 pk daily  Substance Use Topics   Alcohol use: No     ALLERGIES:   Allergies  Allergen  Reactions   Doxycycline Hives, Rash and Other (See Comments)    Pain, photosensitive     CURRENT MEDICATIONS:   Current Outpatient Medications  Medication Sig Dispense Refill   albuterol (VENTOLIN HFA) 108 (90 Base) MCG/ACT inhaler Inhale 1-2 puffs into the lungs every 4 (four) hours as needed for wheezing or shortness of breath. 1 each 2   amLODipine (NORVASC) 5 MG tablet Take 1 tablet (5 mg total) by mouth daily. 180 tablet 3   Aspirin-Salicylamide-Caffeine (BC HEADACHE POWDER PO) Take 1 packet by mouth daily as needed (for headache).      atorvastatin (LIPITOR) 80 MG tablet Take 1 tablet (80 mg total) by mouth at bedtime. 90 tablet 3   Cholecalciferol (VITAMIN D3 PO) Take 4,000 mg by mouth at bedtime.     clobetasol ointment (TEMOVATE) 0.05 % APPLY TO THE AFFECTED AREA(S) TOPICALLY TWICE DAILY AS NEEDED FOR A MAX OF TWO WEEKS TO AVOID WHITENING/THINNING SKIN. 15 g 1   clopidogrel (PLAVIX) 75 MG tablet Take 1 tablet (75 mg total) by mouth daily. 90 tablet 1   diclofenac sodium (VOLTAREN) 1 % GEL Apply 4 g topically 4 (four) times daily. To affected joint. 100 g 11   estradiol (ESTRACE) 0.1 MG/GM vaginal cream Place 1 Applicatorful vaginally 3 (three) times  a week. 42.5 g 12   losartan (COZAAR) 100 MG tablet Take 1 tablet (100 mg total) by mouth daily. 90 tablet 3   lubiprostone (AMITIZA) 8 MCG capsule Take by mouth.     omeprazole (PRILOSEC) 40 MG capsule Take 1 capsule (40 mg total) by mouth daily. 90 capsule 3   promethazine (PHENERGAN) 12.5 MG tablet Take 1 tablet (12.5 mg total) by mouth every 4 (four) hours as needed for nausea. 30 tablet 1   vitamin B-12 (CYANOCOBALAMIN) 1000 MCG tablet Take 1,000 mcg by mouth daily.      senna (SENOKOT) 8.6 MG tablet Take 2 tablets (17.2 mg total) by mouth daily. As needed for constipation (Patient not taking: Reported on 10/10/2020) 90 tablet 1   No current facility-administered medications for this visit.    REVIEW OF SYSTEMS:   [X]  denotes  positive finding, [ ]  denotes negative finding Cardiac  Comments:  Chest pain or chest pressure:    Shortness of breath upon exertion:    Short of breath when lying flat:    Irregular heart rhythm:        Vascular    Pain in calf, thigh, or hip brought on by ambulation:    Pain in feet at night that wakes you up from your sleep:     Blood clot in your veins:    Leg swelling:         Pulmonary    Oxygen at home:    Productive cough:     Wheezing:         Neurologic    Sudden weakness in arms or legs:     Sudden numbness in arms or legs:     Sudden onset of difficulty speaking or slurred speech:    Temporary loss of vision in one eye:     Problems with dizziness:         Gastrointestinal    Blood in stool:     Vomited blood:         Genitourinary    Burning when urinating:     Blood in urine:        Psychiatric    Major depression:         Hematologic    Bleeding problems:    Problems with blood clotting too easily:        Skin    Rashes or ulcers:        Constitutional    Fever or chills:      PHYSICAL EXAM:   Vitals:   10/10/20 1220 10/10/20 1221  BP: 91/67 129/81  Pulse: 89   Resp: 20   Temp: 97.9 F (36.6 C)   SpO2: 97%   Weight: 179 lb (81.2 kg)   Height: 5\' 5"  (1.651 m)     GENERAL: The patient is a well-nourished female, in no acute distress. The vital signs are documented above. CARDIAC: There is a regular rate and rhythm.  VASCULAR: Nonpalpable radial pulse on the left PULMONARY: Non-labored respirations MUSCULOSKELETAL: There are no major deformities or cyanosis. NEUROLOGIC: No focal weakness or paresthesias are detected. SKIN: There are no ulcers or rashes noted. PSYCHIATRIC: The patient has a normal affect.  STUDIES:   I have reviewed her CTA with the following findings:  70% stenosis of the ICA bulb on the right.   70-75% stenosis of the ICA bulb on the left. 30-50% stenosis of the proximal/origin of the left common carotid  artery.   Severe atherosclerotic disease affecting  the proximal 2 cm of the left subclavian artery. Definite antegrade flow not established by this study, but there may be a thread of flow adjacent to the dense calcified plaque. Occlusion of the left vertebral artery at its origin with reconstitution in the lower cervical region and patency beyond that to the basilar. Dominant right vertebral artery widely patent.   Question second stenosis of the left subclavian artery at the level of the lung apex. This is not definite, as there is dense venous contrast adjacent to that which could cause artifact.   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 This would be at high risk of rupture. One could question a localized dissection adjacent to the base of that aneurysm.   Enlarged and heterogeneous thyroid gland, evaluated by ultrasound in February of 2019, and not grossly changed by CT.    MEDICAL ISSUES:   Left subclavian artery occlusion: The patient is very symptomatic from this and would like to have this fixed.  I do not think she is a good percutaneous candidate.  I sent her for CT scan for surgical planning.  My plan would be for a left carotid to left subclavian bypass graft.  I will also need to perform angiography to evaluate her for possible ostial left common carotid artery stenosis which may need to be stented.  Her CT scan suggested significant left internal carotid stenosis.  U/S did not see this.  I discussed that I would not address this at the time of her carotid subclavian bypass, but that she may need addressed in the future.  Basilar artery abnormality: CT scan demonstrated a basilar artery aneurysm.  I am referring her to neurosurgery for further evaluation.  This will take precedence of her left subclavian issues.    Charlena Cross, MD, FACS [facility]  [facility] [phone] Pager [phone]

## 2020-10-11 ENCOUNTER — Ambulatory Visit: Payer: Federal, State, Local not specified - PPO | Admitting: Neurology

## 2020-10-12 ENCOUNTER — Other Ambulatory Visit: Payer: Self-pay | Admitting: Neurosurgery

## 2020-10-12 DIAGNOSIS — I671 Cerebral aneurysm, nonruptured: Secondary | ICD-10-CM

## 2020-10-18 ENCOUNTER — Other Ambulatory Visit: Payer: Self-pay | Admitting: Neurosurgery

## 2020-11-07 ENCOUNTER — Ambulatory Visit: Payer: Federal, State, Local not specified - PPO | Admitting: Surgery

## 2020-11-07 ENCOUNTER — Other Ambulatory Visit: Payer: Self-pay | Admitting: Internal Medicine

## 2020-11-08 ENCOUNTER — Ambulatory Visit (HOSPITAL_COMMUNITY)
Admission: RE | Admit: 2020-11-08 | Discharge: 2020-11-08 | Disposition: A | Discharge status: Home / Self Care | Payer: Federal, State, Local not specified - PPO | Source: Ambulatory Visit | Attending: Neurosurgery | Admitting: Neurosurgery

## 2020-11-08 ENCOUNTER — Other Ambulatory Visit: Payer: Self-pay

## 2020-11-08 ENCOUNTER — Other Ambulatory Visit: Payer: Self-pay | Admitting: Neurosurgery

## 2020-11-08 DIAGNOSIS — I671 Cerebral aneurysm, nonruptured: Secondary | ICD-10-CM

## 2020-11-08 DIAGNOSIS — Z87891 Personal history of nicotine dependence: Secondary | ICD-10-CM | POA: Insufficient documentation

## 2020-11-08 DIAGNOSIS — Z7902 Long term (current) use of antithrombotics/antiplatelets: Secondary | ICD-10-CM | POA: Insufficient documentation

## 2020-11-08 DIAGNOSIS — I739 Peripheral vascular disease, unspecified: Secondary | ICD-10-CM | POA: Diagnosis not present

## 2020-11-08 DIAGNOSIS — I10 Essential (primary) hypertension: Secondary | ICD-10-CM | POA: Insufficient documentation

## 2020-11-08 DIAGNOSIS — Z79899 Other long term (current) drug therapy: Secondary | ICD-10-CM | POA: Insufficient documentation

## 2020-11-08 DIAGNOSIS — Z881 Allergy status to other antibiotic agents status: Secondary | ICD-10-CM | POA: Insufficient documentation

## 2020-11-08 HISTORY — PX: IR ANGIO INTRA EXTRACRAN SEL INTERNAL CAROTID UNI R MOD SED: IMG5362

## 2020-11-08 HISTORY — PX: IR US GUIDE VASC ACCESS RIGHT: IMG2390

## 2020-11-08 HISTORY — PX: IR ANGIO VERTEBRAL SEL SUBCLAVIAN INNOMINATE UNI R MOD SED: IMG5365

## 2020-11-08 LAB — BASIC METABOLIC PANEL
Anion gap: 12 (ref 5–15)
BUN: 15 mg/dL (ref 6–20)
CO2: 23 mmol/L (ref 22–32)
Calcium: 9 mg/dL (ref 8.9–10.3)
Chloride: 105 mmol/L (ref 98–111)
Creatinine, Ser: 0.86 mg/dL (ref 0.44–1.00)
GFR, Estimated: >60 mL/min (ref >60)
Glucose, Bld: 107 mg/dL — ABNORMAL HIGH (ref 70–99)
Potassium: 3.6 mmol/L (ref 3.5–5.1)
Sodium: 140 mmol/L (ref 135–145)

## 2020-11-08 LAB — CBC WITH DIFFERENTIAL/PLATELET
Abs Immature Granulocytes: 0.1 10*3/uL — ABNORMAL HIGH (ref 0.00–0.07)
Basophils Absolute: 0.1 10*3/uL (ref 0.0–0.1)
Basophils Relative: 1 %
Eosinophils Absolute: 0.3 10*3/uL (ref 0.0–0.5)
Eosinophils Relative: 4 %
HCT: 40.3 % (ref 36.0–46.0)
Hemoglobin: 12.3 g/dL (ref 12.0–15.0)
Immature Granulocytes: 1 %
Lymphocytes Relative: 23 %
Lymphs Abs: 1.9 10*3/uL (ref 0.7–4.0)
MCH: 25.8 pg — ABNORMAL LOW (ref 26.0–34.0)
MCHC: 30.5 g/dL (ref 30.0–36.0)
MCV: 84.7 fL (ref 80.0–100.0)
Monocytes Absolute: 0.5 10*3/uL (ref 0.1–1.0)
Monocytes Relative: 6 %
Neutro Abs: 5.3 10*3/uL (ref 1.7–7.7)
Neutrophils Relative %: 65 %
Platelets: 396 10*3/uL (ref 150–400)
RBC: 4.76 MIL/uL (ref 3.87–5.11)
RDW: 13.7 % (ref 11.5–15.5)
WBC: 8.2 10*3/uL (ref 4.0–10.5)
nRBC: 0 % (ref 0.0–0.2)

## 2020-11-08 LAB — URINALYSIS, ROUTINE W REFLEX MICROSCOPIC
Bilirubin Urine: NEGATIVE
Glucose, UA: NEGATIVE mg/dL
Hgb urine dipstick: NEGATIVE
Ketones, ur: NEGATIVE mg/dL
Nitrite: NEGATIVE
Protein, ur: NEGATIVE mg/dL
Specific Gravity, Urine: 1.025 (ref 1.005–1.030)
pH: 5 (ref 5.0–8.0)

## 2020-11-08 LAB — PROTIME-INR
INR: 0.9 (ref 0.8–1.2)
Prothrombin Time: 12.5 seconds (ref 11.4–15.2)

## 2020-11-08 LAB — APTT: aPTT: 24 seconds (ref 24–36)

## 2020-11-08 MED ORDER — CHLORHEXIDINE GLUCONATE CLOTH 2 % EX PADS: 6.0000 | MEDICATED_PAD | Freq: Once | CUTANEOUS | Status: DC

## 2020-11-08 MED ORDER — HEPARIN SODIUM (PORCINE) 1000 UNIT/ML IJ SOLN
INTRAMUSCULAR | Status: AC
  Filled 2020-11-08: qty 1

## 2020-11-08 MED ORDER — NITROGLYCERIN 1 MG/10 ML FOR IR/CATH LAB: INTRA_ARTERIAL | Status: DC | PRN

## 2020-11-08 MED ORDER — VERAPAMIL HCL 2.5 MG/ML IV SOLN
INTRAVENOUS | Status: AC
  Filled 2020-11-08: qty 2

## 2020-11-08 MED ORDER — NITROGLYCERIN 1 MG/10 ML FOR IR/CATH LAB
INTRA_ARTERIAL | Status: AC
  Filled 2020-11-08: qty 10

## 2020-11-08 MED ORDER — VERAPAMIL HCL 2.5 MG/ML IV SOLN: INTRAVENOUS | Status: DC | PRN

## 2020-11-08 MED ORDER — FENTANYL CITRATE (PF) 100 MCG/2ML IJ SOLN
INTRAMUSCULAR | Status: DC | PRN
  Administered 2020-11-08: 25 ug via INTRAVENOUS

## 2020-11-08 MED ORDER — CEFAZOLIN SODIUM-DEXTROSE 2-4 GM/100ML-% IV SOLN: 2.0000 g | INTRAVENOUS | Status: DC

## 2020-11-08 MED ORDER — MIDAZOLAM HCL 2 MG/2ML IJ SOLN
INTRAMUSCULAR | Status: AC
  Filled 2020-11-08: qty 4

## 2020-11-08 MED ORDER — IOHEXOL 240 MG/ML SOLN
50.0000 mL | Freq: Once | INTRAMUSCULAR | Status: AC | PRN
  Administered 2020-11-08: 10 mL via INTRA_ARTERIAL

## 2020-11-08 MED ORDER — FENTANYL CITRATE (PF) 100 MCG/2ML IJ SOLN
INTRAMUSCULAR | Status: AC
  Filled 2020-11-08: qty 4

## 2020-11-08 MED ORDER — HEPARIN SODIUM (PORCINE) 1000 UNIT/ML IJ SOLN
INTRAMUSCULAR | Status: DC | PRN
  Administered 2020-11-08: 3000 [IU] via INTRAVENOUS

## 2020-11-08 MED ORDER — IOHEXOL 240 MG/ML SOLN
50.0000 mL | Freq: Once | INTRAMUSCULAR | Status: AC | PRN
  Administered 2020-11-08: 30 mL via INTRA_ARTERIAL

## 2020-11-08 MED ORDER — LIDOCAINE HCL (PF) 1 % IJ SOLN
INTRAMUSCULAR | Status: DC | PRN
  Administered 2020-11-08: 1 mL

## 2020-11-08 MED ORDER — HYDROCODONE-ACETAMINOPHEN 5-325 MG PO TABS: 1.0000 | ORAL_TABLET | ORAL | Status: DC | PRN

## 2020-11-08 MED ORDER — SODIUM CHLORIDE 0.9 % IV SOLN: INTRAVENOUS | Status: DC

## 2020-11-08 MED ORDER — LIDOCAINE HCL 1 % IJ SOLN
INTRAMUSCULAR | Status: AC
  Filled 2020-11-08: qty 20

## 2020-11-08 MED ORDER — MIDAZOLAM HCL 2 MG/2ML IJ SOLN
INTRAMUSCULAR | Status: DC | PRN
  Administered 2020-11-08: 1 mg via INTRAVENOUS

## 2020-11-08 NOTE — Brief Op Note (Signed)
  NEUROSURGERY BRIEF OPERATIVE  NOTE   PREOP DX: Basilar aneurysm  POSTOP DX: Same  PROCEDURE: Diagnostic cerebral angiogram  SURGEON: Dr. Lisbeth Renshaw, MD  ANESTHESIA: IV Sedation with Local  EBL: Minimal  SPECIMENS: None  COMPLICATIONS: None  CONDITION: Stable to recovery  FINDINGS (Full report in [facility]): 1. ~73mm anteriorly projecting aneurysm arising at the vertebro-basilar junction   Lisbeth Renshaw, MD [facility]

## 2020-11-08 NOTE — Sedation Documentation (Signed)
Nurse transported patient to short stay and assessed TRBand with nurse. Radial pulse +2 clean and dry.

## 2020-11-08 NOTE — H&P (Signed)
Chief Complaint  Aneurysm  History of Present Illness  Tina Navarro is a 60 y.o. female with a history of peripheral vascular disease having previously undergone multiple common iliac angioplasty and stent procedures.  Most recently she was complaining of some left upper extremity paresthesias with asymmetric blood pressure.  CT angiogram confirmed the presence of severe left subclavian stenosis.  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  She was therefore referred for neurosurgical evaluation.  Of note, the patient does have a history of hypertension, denying history of diabetes, heart attack, or stroke.  She quit tobacco smoking approximately 1 and half years ago.  There is no known family history of intracranial aneurysms with the exception of a first cousin on her father side.  Past Medical History   Past Medical History:  Diagnosis Date   Emphysematous COPD (HCC) 04/23/2017   GERD (gastroesophageal reflux disease)    Hyperlipidemia    Hypertension    Peripheral arterial disease (HCC)    Thyroid nodule 04/23/2017    Past Surgical History   Past Surgical History:  Procedure Laterality Date   ABDOMINAL AORTOGRAM N/A 07/16/2017   Procedure: ABDOMINAL AORTOGRAM;  Surgeon: Nada Libman, MD;  [facility];  Service: Cardiovascular;  Laterality: N/A;   ABDOMINAL AORTOGRAM W/LOWER EXTREMITY Bilateral 12/02/2018   Procedure: ABDOMINAL AORTOGRAM W/LOWER EXTREMITY;  Surgeon: Nada Libman, MD;  [facility];  Service: Cardiovascular;  Laterality: Bilateral;   AORTIC ARCH ANGIOGRAPHY N/A 12/02/2018   Procedure: AORTIC ARCH ANGIOGRAPHY;  Surgeon: Nada Libman, MD;  [facility];  Service: Cardiovascular;  Laterality: N/A;   ENDOMETRIAL BIOPSY  2005   FEMORAL ARTERY STENT Bilateral 2008   LOWER EXTREMITY ANGIOGRAPHY Bilateral 07/16/2017   Procedure: Lower Extremity Angiography;  Surgeon: Nada Libman, MD;   [facility];  Service: Cardiovascular;  Laterality: Bilateral;   NASAL SINUS SURGERY     PERIPHERAL VASCULAR BALLOON ANGIOPLASTY Bilateral 07/16/2017   Procedure: PERIPHERAL VASCULAR BALLOON ANGIOPLASTY;  Surgeon: Nada Libman, MD;  [facility];  Service: Cardiovascular;  Laterality: Bilateral;  iliacs   PERIPHERAL VASCULAR INTERVENTION Bilateral 12/02/2018   Procedure: PERIPHERAL VASCULAR INTERVENTION;  Surgeon: Nada Libman, MD;  [facility];  Service: Cardiovascular;  Laterality: Bilateral;   TONSILLECTOMY     TUBAL LIGATION      Social History   Social History   Tobacco Use   Smoking status: Former    Packs/day: 0.50    Years: 35.00    Pack years: 17.50    Types: Cigarettes    Quit date: 12/18/2017    Years since quitting: 2.8   Smokeless tobacco: Never   Tobacco comments:    As per pt - smoking 1/2 pk daily  Vaping Use   Vaping Use: Never used  Substance Use Topics   Alcohol use: No   Drug use: No    Medications   Prior to Admission medications   Medication Sig Start Date End Date Taking? Authorizing Provider  amLODipine (NORVASC) 5 MG tablet Take 1 tablet (5 mg total) by mouth daily. 09/07/20 12/06/20 Yes Lewayne Bunting, MD  Aspirin-Salicylamide-Caffeine (BC HEADACHE POWDER PO) Take 1 packet by mouth daily as needed (for headache).    Yes [provider]  atorvastatin (LIPITOR) 80 MG tablet Take 1 tablet (80 mg total) by mouth at bedtime. 09/07/20  Yes Lewayne Bunting, MD  Cholecalciferol (VITAMIN D3  PO) Take 4,000 mg by mouth at bedtime.   Yes [provider]  clopidogrel (PLAVIX) 75 MG tablet Take 1 tablet (75 mg total) by mouth daily. 09/26/20  Yes Sunnie Nielsen, DO  diclofenac sodium (VOLTAREN) 1 % GEL Apply 4 g topically 4 (four) times daily. To affected joint. 11/18/18  Yes Rodolph Bong, MD  estradiol (ESTRACE) 0.1 MG/GM vaginal cream Place 1 Applicatorful vaginally 3 (three) times a  week. 10/07/19  Yes Sunnie Nielsen, DO  losartan (COZAAR) 100 MG tablet Take 1 tablet (100 mg total) by mouth daily. 09/01/20  Yes Sunnie Nielsen, DO  omeprazole (PRILOSEC) 40 MG capsule Take 1 capsule (40 mg total) by mouth daily. 09/01/20  Yes Sunnie Nielsen, DO  promethazine (PHENERGAN) 12.5 MG tablet Take 1 tablet (12.5 mg total) by mouth every 4 (four) hours as needed for nausea. 04/01/20  Yes Sunnie Nielsen, DO  senna (SENOKOT) 8.6 MG tablet Take 2 tablets (17.2 mg total) by mouth daily. As needed for constipation 04/29/17  Yes Sunnie Nielsen, DO  vitamin B-12 (CYANOCOBALAMIN) 1000 MCG tablet Take 1,000 mcg by mouth daily.    Yes [provider]  albuterol (VENTOLIN HFA) 108 (90 Base) MCG/ACT inhaler Inhale 1-2 puffs into the lungs every 4 (four) hours as needed for wheezing or shortness of breath. 09/01/20   Sunnie Nielsen, DO  clobetasol ointment (TEMOVATE) 0.05 % APPLY TO THE AFFECTED AREA(S) TOPICALLY TWICE DAILY AS NEEDED FOR A MAX OF TWO WEEKS TO AVOID WHITENING/THINNING SKIN. 11/09/19   Sunnie Nielsen, DO    Allergies   Allergies  Allergen Reactions   Doxycycline Hives, Rash and Other (See Comments)    Pain, photosensitive    Review of Systems  ROS  Neurologic Exam  Awake, alert, oriented Memory and concentration grossly intact Speech fluent, appropriate CN grossly intact Motor exam: Upper Extremities Deltoid Bicep Tricep Grip  Right 5/5 5/5 5/5 5/5  Left 5/5 5/5 5/5 5/5   Lower Extremities IP Quad PF DF EHL  Right 5/5 5/5 5/5 5/5 5/5  Left 5/5 5/5 5/5 5/5 5/5   Sensation grossly intact to LT  Imaging  CT angiogram of the chest neck dated 10/04/2020 was personally reviewed.  In addition to the atherosclerotic findings in the neck, the right vertebral artery is noted to be dominant.  There is an unusual anteriorly projecting aneurysm at the level of the vertebrobasilar junction extending to the clivus.  This measures approximately 9 mm in  length, and approximately 2 mm tall.  There is some calcification of the distal aspect of the aneurysm.  Impression  - 60 y.o. 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  Given the patient's significant history of iliac disease and subclavian stenosis, we will attempt right transradial approach.  I have reviewed the indications for the angiogram procedure with the patient.  We have discussed the details of the procedure as well as the expected postoperative course and recovery.  We have reviewed the risks of the procedure, benefits of the procedure, and the alternatives.  All her questions today were answered.  She provided informed consent to proceed.  Lisbeth Renshaw, MD [facility]

## 2020-11-08 NOTE — Progress Notes (Signed)
Pt ambulated without difficulty or bleeding.   Discharged home with her husband who will drive and stay with pt x 24 hrs. 

## 2020-11-09 ENCOUNTER — Encounter: Payer: Self-pay | Admitting: Osteopathic Medicine

## 2020-11-16 DIAGNOSIS — Z6829 Body mass index (BMI) 29.0-29.9, adult: Secondary | ICD-10-CM | POA: Diagnosis not present

## 2020-11-16 DIAGNOSIS — I671 Cerebral aneurysm, nonruptured: Secondary | ICD-10-CM | POA: Diagnosis not present

## 2020-11-17 ENCOUNTER — Other Ambulatory Visit: Payer: Self-pay | Admitting: Neurosurgery

## 2020-11-17 ENCOUNTER — Other Ambulatory Visit (HOSPITAL_COMMUNITY): Payer: Self-pay | Admitting: Neurosurgery

## 2020-11-17 DIAGNOSIS — I671 Cerebral aneurysm, nonruptured: Secondary | ICD-10-CM

## 2020-11-26 HISTORY — PX: OTHER SURGICAL HISTORY: SHX169

## 2020-11-28 ENCOUNTER — Ambulatory Visit: Payer: Federal, State, Local not specified - PPO | Admitting: Surgery

## 2020-11-28 NOTE — Progress Notes (Signed)
Surgical Instructions    Your procedure is scheduled on Friday, October 7th.  Report to [facility] Main Entrance "A" at 9:30 A.M., then check in with the Admitting office.  Call this number if you have problems the morning of surgery:  [phone]   If you have any questions prior to your surgery date call [phone]: Open Monday-Friday 8am-4pm    Remember:  Do not eat after midnight the night before your surgery  You may drink clear liquids until 8:30 AM the morning of your surgery.   Clear liquids allowed are: Water, Non-Citrus Juices (without pulp), Carbonated Beverages, Clear Tea, Black Coffee ONLY (NO MILK, CREAM OR POWDERED CREAMER of any kind), and Gatorade    Take these medicines the morning of surgery with A SIP OF WATER   Atorvastatin (Lipitor)  Omeprazole (Prilosec)   If needed: Tylenol   Albuterol inhaler - bring with you on day of surgery    Follow your surgeon's instructions on when to stop Aspirin & Plavix.  If no instructions were given by your surgeon then you will need to call the office to get those instructions.    As of today, STOP taking Aleve, Naproxen, Ibuprofen, Motrin, Advil, Goody's, BC's, all herbal medications, fish oil, and all vitamins.          Do not wear jewelry, makeup, or nail polish Do not wear lotions, powders, perfumes, or deodorant. Do not shave 48 hours prior to surgery.   Do not bring valuables to the hospital.             [facility] is not responsible for any belongings or valuables.  Do NOT Smoke (Tobacco/Vaping)  24 hours prior to your procedure If you use a CPAP at night, you may bring your mask for your overnight stay.   Contacts, glasses, dentures or bridgework may not be worn into surgery, please bring cases for these belongings   For patients admitted to the hospital, discharge time will be determined by your treatment team.   Patients discharged the day of surgery will not be allowed to drive home, and someone needs to  stay with them for 24 hours.  NO VISITORS WILL BE ALLOWED IN PRE-OP WHERE PATIENTS ARE PREPPED FOR SURGERY.  ONLY 1 SUPPORT PERSON MAY BE PRESENT IN THE WAITING ROOM WHILE YOU ARE IN SURGERY.  IF YOU ARE TO BE ADMITTED, ONCE YOU ARE IN YOUR ROOM YOU WILL BE ALLOWED TWO (2) VISITORS. 1 (ONE) VISITOR MAY STAY OVERNIGHT BUT MUST ARRIVE TO THE ROOM BY 8pm.  Minor children may have two parents present. Special consideration for safety and communication needs will be reviewed on a case by case basis.  Special instructions:    Oral Hygiene is also important to reduce your risk of infection.  Remember - BRUSH YOUR TEETH THE MORNING OF SURGERY WITH YOUR REGULAR TOOTHPASTE   [place]- Preparing For Surgery  Before surgery, you can play an important role. Because skin is not sterile, your skin needs to be as free of germs as possible. You can reduce the number of germs on your skin by washing with CHG (chlorahexidine gluconate) Soap before surgery.  CHG is an antiseptic cleaner which kills germs and bonds with the skin to continue killing germs even after washing.     Please do not use if you have an allergy to CHG or antibacterial soaps. If your skin becomes reddened/irritated stop using the CHG.  Do not shave (including legs and underarms) for at  least 48 hours prior to first CHG shower. It is OK to shave your face.  Please follow these instructions carefully.     Shower the NIGHT BEFORE SURGERY and the MORNING OF SURGERY with CHG Soap.   If you chose to wash your hair, wash your hair first as usual with your normal shampoo. After you shampoo, rinse your hair and body thoroughly to remove the shampoo.  Then [employer] and genitals (private parts) with your normal soap and rinse thoroughly to remove soap.  After that Use CHG Soap as you would any other liquid soap. You can apply CHG directly to the skin and wash gently with a scrungie or a clean washcloth.   Apply the CHG Soap to your body ONLY FROM  THE NECK DOWN.  Do not use on open wounds or open sores. Avoid contact with your eyes, ears, mouth and genitals (private parts). Wash Face and genitals (private parts)  with your normal soap.   Wash thoroughly, paying special attention to the area where your surgery will be performed.  Thoroughly rinse your body with warm water from the neck down.  DO NOT shower/wash with your normal soap after using and rinsing off the CHG Soap.  Pat yourself dry with a CLEAN TOWEL.  Wear CLEAN PAJAMAS to bed the night before surgery  Place CLEAN SHEETS on your bed the night before your surgery  DO NOT SLEEP WITH PETS.   Day of Surgery:  Take a shower with CHG soap. Wear Clean/Comfortable clothing the morning of surgery Do not apply any deodorants/lotions.   Remember to brush your teeth WITH YOUR REGULAR TOOTHPASTE.   Please read over the following fact sheets that you were given.

## 2020-11-29 ENCOUNTER — Inpatient Hospital Stay (HOSPITAL_COMMUNITY)
Admission: RE | Admit: 2020-11-29 | Discharge: 2020-11-29 | Disposition: A | Discharge status: Home / Self Care | Payer: Federal, State, Local not specified - PPO | Source: Ambulatory Visit

## 2020-11-30 ENCOUNTER — Encounter (HOSPITAL_COMMUNITY): Payer: Self-pay

## 2020-11-30 ENCOUNTER — Other Ambulatory Visit: Payer: Self-pay

## 2020-11-30 ENCOUNTER — Encounter (HOSPITAL_COMMUNITY)
Admission: RE | Admit: 2020-11-30 | Discharge: 2020-11-30 | Disposition: A | Discharge status: Home / Self Care | Payer: Federal, State, Local not specified - PPO | Source: Ambulatory Visit | Attending: Neurosurgery | Admitting: Neurosurgery

## 2020-11-30 DIAGNOSIS — I10 Essential (primary) hypertension: Secondary | ICD-10-CM | POA: Diagnosis not present

## 2020-11-30 DIAGNOSIS — Z01812 Encounter for preprocedural laboratory examination: Secondary | ICD-10-CM | POA: Insufficient documentation

## 2020-11-30 DIAGNOSIS — Z7902 Long term (current) use of antithrombotics/antiplatelets: Secondary | ICD-10-CM | POA: Diagnosis not present

## 2020-11-30 DIAGNOSIS — J439 Emphysema, unspecified: Secondary | ICD-10-CM | POA: Diagnosis not present

## 2020-11-30 DIAGNOSIS — F419 Anxiety disorder, unspecified: Secondary | ICD-10-CM | POA: Diagnosis not present

## 2020-11-30 DIAGNOSIS — Z20822 Contact with and (suspected) exposure to covid-19: Secondary | ICD-10-CM | POA: Insufficient documentation

## 2020-11-30 DIAGNOSIS — I725 Aneurysm of other precerebral arteries: Secondary | ICD-10-CM | POA: Diagnosis not present

## 2020-11-30 DIAGNOSIS — Z7982 Long term (current) use of aspirin: Secondary | ICD-10-CM | POA: Diagnosis not present

## 2020-11-30 DIAGNOSIS — Z881 Allergy status to other antibiotic agents status: Secondary | ICD-10-CM | POA: Diagnosis not present

## 2020-11-30 DIAGNOSIS — I739 Peripheral vascular disease, unspecified: Secondary | ICD-10-CM | POA: Diagnosis not present

## 2020-11-30 DIAGNOSIS — Z79899 Other long term (current) drug therapy: Secondary | ICD-10-CM | POA: Diagnosis not present

## 2020-11-30 DIAGNOSIS — K219 Gastro-esophageal reflux disease without esophagitis: Secondary | ICD-10-CM | POA: Diagnosis not present

## 2020-11-30 DIAGNOSIS — Z87891 Personal history of nicotine dependence: Secondary | ICD-10-CM | POA: Diagnosis not present

## 2020-11-30 DIAGNOSIS — E785 Hyperlipidemia, unspecified: Secondary | ICD-10-CM | POA: Diagnosis not present

## 2020-11-30 LAB — TYPE AND SCREEN
ABO/RH(D): A NEG
Antibody Screen: NEGATIVE

## 2020-11-30 LAB — SARS CORONAVIRUS 2 (TAT 6-24 HRS): SARS Coronavirus 2: NEGATIVE

## 2020-11-30 LAB — SURGICAL PCR SCREEN
MRSA, PCR: NEGATIVE
Staphylococcus aureus: NEGATIVE

## 2020-11-30 NOTE — Progress Notes (Signed)
PCP - Dr. Sunnie Nielsen- Pt says Dr. Lyn Hollingshead left on 9/30. She will still attend the same practice but is not sure who her new PCP will be.  Cardiologist - Dr. Olga Millers  PPM/ICD - denies   Chest x-ray - 04/22/17 EKG - 09/07/20 Stress Test - 09/27/20 ECHO - denies Cardiac Cath - 12/02/18  Sleep Study - pt states she had one "years ago" and was advised to wear a CPAP but she hasn't worn one in "a long time." CPAP - No  DM: denies  Blood Thinner Instructions: Plavix- continue through day of surgery Aspirin Instructions: Start ASA 7 days prior to surgery. Pt started taking on 9/30. Pt states she was given these instructions from Dr. Val Riles office  ERAS Protcol - yes, no drink   COVID TEST- 11/30/20 at PAT appt, results pending   Anesthesia review: Yes, Cardiac clearance  Patient denies shortness of breath, fever, cough and chest pain at PAT appointment   All instructions explained to the patient, with a verbal understanding of the material. Patient agrees to go over the instructions while at home for a better understanding. Patient also instructed to wear mask in public after COVID test.The opportunity to ask questions was provided.

## 2020-12-01 NOTE — Progress Notes (Signed)
Anesthesia Chart Review:   Case: [id number] Date/Time: 12/02/20 1115   Procedure: Arteriogram, Surpass embolization of basilar aneurysm   Anesthesia type: General   Pre-op diagnosis: I67.1 Cerebral aneurysm without rupture   [facility]   Surgeons: Lisbeth Renshaw, MD       DISCUSSION: Pt is 60 years old with hx HTN, PAD (s/p PCI to legs 2007, angioplasty of bilateral common iliac stenosis 2019, bilateral common iliac VBX stents 2020), carotid artery disease  Pt also has left subclavian artery occlusion and Dr. Myra Gianotti plans for left subclavian bypass graft.  However his note 10/10/20 indicates basilar artery abnormality takes precedence over her left subclavian issues. He referred pt to neurosurgery. He notes carotid artery disease may need to be addressed in the future.   Pt to continue Plavix through day of surgery and start ASA 7 days prior to surgery. Pt started taking on 9/30.   VS: BP (!) 144/78   Pulse 96   Temp 37.1 C (Oral)   Resp 18   Ht 5\' 5"  (1.651 m)   Wt 81.2 kg   LMP  (LMP Unknown)   SpO2 100%   BMI 29.79 kg/m   PROVIDERS: - PCP is , DO - Saw cardiologist Sunnie Nielsen, MD 09/07/20 for pre-op eval. Stress test ordered, results normal.  - Vascular surgeon is 09/09/20, MD. Last office visit 10/10/20   LABS: Labs reviewed: Acceptable for surgery. (all labs ordered are listed, but only abnormal results are displayed)  Labs Reviewed  SARS CORONAVIRUS 2 (TAT 6-24 HRS)  SURGICAL PCR SCREEN  TYPE AND SCREEN   - PT, PTT, CBC w/diff, and CMP from 11/08/20 are acceptable for surgery - UA with moderate leukocytes, few bacteria on 11/08/20 was ordered by Dr. 11/10/20   IMAGES: CT angio nexk 10/04/20:  - 70% stenosis of the ICA bulb on the right. - 70-75% stenosis of the ICA bulb on the left. 30-50% stenosis of the proximal/origin of the left common carotid artery. - Severe atherosclerotic disease affecting the  proximal 2 cm of the left subclavian artery. Definite antegrade flow not established by this study, but there may be a thread of flow adjacent to the dense calcified plaque. Occlusion of the left vertebral artery at its origin with reconstitution in the lower cervical region and patency beyond that to the basilar. Dominant right vertebral artery widely patent. - Question second stenosis of the left subclavian artery at the level of the lung apex. This is not definite, as there is dense venous contrast adjacent to that which could cause artifact. - Unusual and worrisome aneurysm of the proximal basilar artery, projecting 9 mm anteriorly with a diameter of about 2.5 mm. This would be at high risk of rupture. One could question a localized dissection adjacent to the base of that aneurysm. - Enlarged and heterogeneous thyroid gland, evaluated by ultrasound in February of 2019, and not grossly changed by CT.   EKG 09/07/20: NSR.  Nonspecific T abnormality.   CV: Nuclear stress test 09/27/20:  Nuclear stress EF: 76%. The left ventricular ejection fraction is hyperdynamic (>65%). This is a low risk study. There is no evidence of ischemia and no evidence of previous infarction. The study is normal.   Carotid 11/27/20 09/26/20:  - Right Carotid: Velocities in the right ICA are consistent with a 40-59% stenosis. The ECA appears >50% stenosed.  - Left Carotid: Velocities in the left ICA are consistent with a 1-39%  stenosis.  -  Vertebrals:  Right vertebral artery demonstrates antegrade flow. Left vertebral artery was not visualized.  - Subclavians: Left subclavian artery was occluded. Normal flow hemodynamics were seen in the right subclavian artery.    Past Medical History:  Diagnosis Date   Emphysematous COPD (HCC) 04/23/2017   GERD (gastroesophageal reflux disease)    Hyperlipidemia    Hypertension    Peripheral arterial disease (HCC)    Thyroid nodule 04/23/2017    Past Surgical History:  Procedure  Laterality Date   ABDOMINAL AORTOGRAM N/A 07/16/2017   Procedure: ABDOMINAL AORTOGRAM;  Surgeon: Nada Libman, MD;  [facility];  Service: Cardiovascular;  Laterality: N/A;   ABDOMINAL AORTOGRAM W/LOWER EXTREMITY Bilateral 12/02/2018   Procedure: ABDOMINAL AORTOGRAM W/LOWER EXTREMITY;  Surgeon: Nada Libman, MD;  [facility];  Service: Cardiovascular;  Laterality: Bilateral;   AORTIC ARCH ANGIOGRAPHY N/A 12/02/2018   Procedure: AORTIC ARCH ANGIOGRAPHY;  Surgeon: Nada Libman, MD;  [facility];  Service: Cardiovascular;  Laterality: N/A;   ENDOMETRIAL BIOPSY  2005   FEMORAL ARTERY STENT Bilateral 2008   IR ANGIO INTRA EXTRACRAN SEL INTERNAL CAROTID UNI R MOD SED  11/08/2020   IR ANGIO VERTEBRAL SEL SUBCLAVIAN INNOMINATE UNI R MOD SED  11/08/2020   IR US GUIDE VASC ACCESS RIGHT  11/08/2020   LOWER EXTREMITY ANGIOGRAPHY Bilateral 07/16/2017   Procedure: Lower Extremity Angiography;  Surgeon: Nada Libman, MD;  [facility];  Service: Cardiovascular;  Laterality: Bilateral;   NASAL SINUS SURGERY     PERIPHERAL VASCULAR BALLOON ANGIOPLASTY Bilateral 07/16/2017   Procedure: PERIPHERAL VASCULAR BALLOON ANGIOPLASTY;  Surgeon: Nada Libman, MD;  [facility];  Service: Cardiovascular;  Laterality: Bilateral;  iliacs   PERIPHERAL VASCULAR INTERVENTION Bilateral 12/02/2018   Procedure: PERIPHERAL VASCULAR INTERVENTION;  Surgeon: Nada Libman, MD;  [facility];  Service: Cardiovascular;  Laterality: Bilateral;   TONSILLECTOMY     TUBAL LIGATION      MEDICATIONS:  acetaminophen (TYLENOL) 500 MG tablet   albuterol (VENTOLIN HFA) 108 (90 Base) MCG/ACT inhaler   amLODipine (NORVASC) 5 MG tablet   aspirin 325 MG tablet   Aspirin-Salicylamide-Caffeine (BC HEADACHE POWDER PO)   atorvastatin (LIPITOR) 80 MG tablet   Cholecalciferol (VITAMIN D3 PO)   clobetasol ointment (TEMOVATE) 0.05 %    clopidogrel (PLAVIX) 75 MG tablet   diclofenac sodium (VOLTAREN) 1 % GEL   estradiol (ESTRACE) 0.1 MG/GM vaginal cream   losartan (COZAAR) 100 MG tablet   omeprazole (PRILOSEC) 40 MG capsule   promethazine (PHENERGAN) 12.5 MG tablet   senna (SENOKOT) 8.6 MG tablet   vitamin B-12 (CYANOCOBALAMIN) 1000 MCG tablet   No current facility-administered medications for this encounter.    If no changes, I anticipate pt can proceed with surgery as scheduled.   Rica Mast, PhD, FNP-BC [facility]/Anesthesiology Phone: [phone] 12/01/2020 9:36 AM

## 2020-12-01 NOTE — Anesthesia Preprocedure Evaluation (Addendum)
Anesthesia Evaluation  Patient identified by MRN, date of birth, ID band Patient awake    Reviewed: Allergy & Precautions, H&P , NPO status , Patient's Chart, lab work & pertinent test results, reviewed documented beta blocker date and time   Airway Mallampati: II  TM Distance: >3 FB Neck ROM: full    Dental no notable dental hx.    Pulmonary neg pulmonary ROS, former smoker,    Pulmonary exam normal breath sounds clear to auscultation       Cardiovascular Exercise Tolerance: Good hypertension, + Peripheral Vascular Disease   Rhythm:regular Rate:Normal  EKG 09/07/20: NSR.  Nonspecific T abnormality.  Nuclear stress test 09/27/20:  Nuclear stress EF: 76%. The left ventricular ejection fraction is hyperdynamic (>65%). This is a low risk study. There is no evidence of ischemia and no evidence of previous infarction. The study is normal.    Neuro/Psych Anxiety Carotid US 09/26/20:  - Right Carotid: Velocities in the right ICA are consistent with a 40-59% stenosis. The ECA appears >50% stenosed.  - Left Carotid: Velocities in the left ICA are consistent with a 1-39%  stenosis.  - Vertebrals: Right vertebral artery demonstrates antegrade flow. Left vertebral artery was not visualized.  - Subclavians: Left subclavian artery was occluded. Normal flow hemodynamics were seen in the right subclavian artery.  Neuromuscular disease negative psych ROS   GI/Hepatic Neg liver ROS, GERD  Medicated,  Endo/Other  negative endocrine ROS  Renal/GU negative Renal ROS  negative genitourinary   Musculoskeletal   Abdominal   Peds  Hematology negative hematology ROS (+)   Anesthesia Other Findings   Reproductive/Obstetrics negative OB ROS                            Anesthesia Physical Anesthesia Plan  ASA: 4  Anesthesia Plan: General   Post-op Pain Management:    Induction: Intravenous  PONV Risk Score  and Plan: 3 and Ondansetron and Treatment may vary due to age or medical condition  Airway Management Planned: Oral ETT  Additional Equipment: Arterial line  Intra-op Plan:   Post-operative Plan: Extubation in OR  Informed Consent: I have reviewed the patients History and Physical, chart, labs and discussed the procedure including the risks, benefits and alternatives for the proposed anesthesia with the patient or authorized representative who has indicated his/her understanding and acceptance.     Dental Advisory Given  Plan Discussed with: CRNA and Anesthesiologist  Anesthesia Plan Comments: (See APP note by Joslyn Hy, FNP  Pt is 60 years old with hx HTN, PAD (s/p PCI to legs 2007, angioplasty of bilateral common iliac stenosis 2019, bilateral common iliac VBX stents 2020), carotid artery disease Pt also has left subclavian artery occlusion and Dr. Myra Gianotti plans for left subclavian bypass graft.  However his note 10/10/20 indicates basilar artery abnormality takes precedence over her left subclavian issues. He referred pt to neurosurgery. He notes carotid artery disease may need to be addressed in the future.  Saw cardiologist Olga Millers, MD 09/07/20 for pre-op eval. Stress test ordered, results normal. CT angio nexk 10/04/20:  - 70% stenosis of the ICA bulb on the right. - 70-75% stenosis of the ICA bulb on the left. 30-50% stenosis of the proximal/origin of the left common carotid artery. - Severe atherosclerotic disease affecting the proximal 2 cm of the left subclavian artery. Definite antegrade flow not established by this study, but there may be a thread of flow adjacent to the  dense calcified plaque. Occlusion of the left vertebral artery at its origin with reconstitution in the lower cervical region and patency beyond that to the basilar. Dominant right vertebral artery widely patent. - Question second stenosis of the left subclavian artery at the level of the lung apex. This is  not definite, as there is dense venous contrast adjacent to that which could cause artifact. - Unusual and worrisome aneurysm of the proximal basilar artery, projecting 9 mm anteriorly with a diameter of about 2.5 mm. This would be at high risk of rupture. One could question a localized dissection adjacent to the base of that aneurysm. - Enlarged and heterogeneous thyroid gland, evaluated by ultrasound in February of 2019, and not grossly changed by CT.)       Anesthesia Quick Evaluation

## 2020-12-02 ENCOUNTER — Other Ambulatory Visit: Payer: Self-pay

## 2020-12-02 ENCOUNTER — Encounter (HOSPITAL_COMMUNITY): Payer: Self-pay | Admitting: Neurosurgery

## 2020-12-02 ENCOUNTER — Encounter (HOSPITAL_COMMUNITY)
Admission: RE | Disposition: A | Discharge status: Home / Self Care | Payer: Self-pay | Source: Home / Self Care | Attending: Neurosurgery

## 2020-12-02 ENCOUNTER — Ambulatory Visit (HOSPITAL_COMMUNITY)
Admission: RE | Admit: 2020-12-02 | Discharge: 2020-12-02 | Disposition: A | Discharge status: Home / Self Care | Payer: Federal, State, Local not specified - PPO | Source: Ambulatory Visit | Attending: Neurosurgery | Admitting: Neurosurgery

## 2020-12-02 ENCOUNTER — Inpatient Hospital Stay (HOSPITAL_COMMUNITY): Payer: Federal, State, Local not specified - PPO | Admitting: Anesthesiology

## 2020-12-02 ENCOUNTER — Inpatient Hospital Stay (HOSPITAL_COMMUNITY): Payer: Federal, State, Local not specified - PPO | Admitting: Emergency Medicine

## 2020-12-02 ENCOUNTER — Inpatient Hospital Stay (HOSPITAL_COMMUNITY)
Admission: RE | Admit: 2020-12-02 | Discharge: 2020-12-04 | DRG: 272 | Disposition: A | Discharge status: Home / Self Care | Payer: Federal, State, Local not specified - PPO | Attending: Neurological Surgery | Admitting: Neurological Surgery

## 2020-12-02 DIAGNOSIS — Z7902 Long term (current) use of antithrombotics/antiplatelets: Secondary | ICD-10-CM | POA: Insufficient documentation

## 2020-12-02 DIAGNOSIS — Z7982 Long term (current) use of aspirin: Secondary | ICD-10-CM | POA: Insufficient documentation

## 2020-12-02 DIAGNOSIS — I725 Aneurysm of other precerebral arteries: Secondary | ICD-10-CM | POA: Diagnosis not present

## 2020-12-02 DIAGNOSIS — Z87891 Personal history of nicotine dependence: Secondary | ICD-10-CM | POA: Insufficient documentation

## 2020-12-02 DIAGNOSIS — E785 Hyperlipidemia, unspecified: Secondary | ICD-10-CM | POA: Diagnosis not present

## 2020-12-02 DIAGNOSIS — K219 Gastro-esophageal reflux disease without esophagitis: Secondary | ICD-10-CM | POA: Diagnosis present

## 2020-12-02 DIAGNOSIS — I671 Cerebral aneurysm, nonruptured: Secondary | ICD-10-CM | POA: Insufficient documentation

## 2020-12-02 DIAGNOSIS — Z79899 Other long term (current) drug therapy: Secondary | ICD-10-CM | POA: Insufficient documentation

## 2020-12-02 DIAGNOSIS — K588 Other irritable bowel syndrome: Secondary | ICD-10-CM

## 2020-12-02 DIAGNOSIS — Z20822 Contact with and (suspected) exposure to covid-19: Secondary | ICD-10-CM | POA: Diagnosis present

## 2020-12-02 DIAGNOSIS — I10 Essential (primary) hypertension: Secondary | ICD-10-CM | POA: Diagnosis present

## 2020-12-02 DIAGNOSIS — J439 Emphysema, unspecified: Secondary | ICD-10-CM | POA: Diagnosis present

## 2020-12-02 DIAGNOSIS — R930 Abnormal findings on diagnostic imaging of skull and head, not elsewhere classified: Secondary | ICD-10-CM | POA: Diagnosis not present

## 2020-12-02 DIAGNOSIS — Z881 Allergy status to other antibiotic agents status: Secondary | ICD-10-CM

## 2020-12-02 DIAGNOSIS — I651 Occlusion and stenosis of basilar artery: Secondary | ICD-10-CM | POA: Diagnosis not present

## 2020-12-02 DIAGNOSIS — I739 Peripheral vascular disease, unspecified: Secondary | ICD-10-CM | POA: Insufficient documentation

## 2020-12-02 DIAGNOSIS — F419 Anxiety disorder, unspecified: Secondary | ICD-10-CM | POA: Diagnosis present

## 2020-12-02 HISTORY — PX: IR ANGIO VERTEBRAL SEL VERTEBRAL BILAT MOD SED: IMG5369

## 2020-12-02 HISTORY — PX: IR TRANSCATH/EMBOLIZ: IMG695

## 2020-12-02 HISTORY — PX: RADIOLOGY WITH ANESTHESIA: SHX6223

## 2020-12-02 HISTORY — PX: IR 3D INDEPENDENT WKST: IMG2385

## 2020-12-02 HISTORY — PX: IR US GUIDE VASC ACCESS RIGHT: IMG2390

## 2020-12-02 HISTORY — PX: IR ANGIOGRAM FOLLOW UP STUDY: IMG697

## 2020-12-02 LAB — ABO/RH: ABO/RH(D): A NEG

## 2020-12-02 SURGERY — IR WITH ANESTHESIA
Anesthesia: General

## 2020-12-02 MED ORDER — LIDOCAINE 2% (20 MG/ML) 5 ML SYRINGE
INTRAMUSCULAR | Status: DC | PRN
  Administered 2020-12-02: 80 mg via INTRAVENOUS

## 2020-12-02 MED ORDER — FENTANYL CITRATE (PF) 100 MCG/2ML IJ SOLN
25.0000 ug | INTRAMUSCULAR | Status: DC | PRN
  Administered 2020-12-02: 25 ug via INTRAVENOUS

## 2020-12-02 MED ORDER — VITAMIN B-12 1000 MCG PO TABS
1000.0000 ug | ORAL_TABLET | Freq: Every day | ORAL | Status: DC
  Administered 2020-12-03 – 2020-12-04 (×2): 1000 ug via ORAL
  Filled 2020-12-02 (×2): qty 1

## 2020-12-02 MED ORDER — CLOBETASOL PROPIONATE 0.05 % EX OINT: TOPICAL_OINTMENT | Freq: Two times a day (BID) | CUTANEOUS | Status: DC

## 2020-12-02 MED ORDER — IOHEXOL 350 MG/ML SOLN
100.0000 mL | Freq: Once | INTRAVENOUS | Status: AC | PRN
  Administered 2020-12-02: 60 mL via INTRA_ARTERIAL

## 2020-12-02 MED ORDER — PHENYLEPHRINE HCL-NACL 20-0.9 MG/250ML-% IV SOLN
INTRAVENOUS | Status: DC | PRN
Start: 2020-12-02 — End: 2020-12-02
  Administered 2020-12-02: 50 ug/min via INTRAVENOUS

## 2020-12-02 MED ORDER — PROMETHAZINE HCL 12.5 MG PO TABS
12.5000 mg | ORAL_TABLET | ORAL | Status: DC | PRN
  Administered 2020-12-04: 12.5 mg via ORAL
  Filled 2020-12-02 (×2): qty 1

## 2020-12-02 MED ORDER — LOSARTAN POTASSIUM 50 MG PO TABS
100.0000 mg | ORAL_TABLET | Freq: Every day | ORAL | Status: DC
  Administered 2020-12-04: 100 mg via ORAL
  Filled 2020-12-02 (×2): qty 2

## 2020-12-02 MED ORDER — ESMOLOL HCL 100 MG/10ML IV SOLN
INTRAVENOUS | Status: DC | PRN
  Administered 2020-12-02: 30 mg via INTRAVENOUS
  Administered 2020-12-02: 40 mg via INTRAVENOUS

## 2020-12-02 MED ORDER — PROMETHAZINE HCL 12.5 MG PO TABS
12.5000 mg | ORAL_TABLET | ORAL | Status: DC | PRN
  Filled 2020-12-02: qty 1

## 2020-12-02 MED ORDER — OXYCODONE HCL 5 MG PO TABS: 5.0000 mg | ORAL_TABLET | Freq: Once | ORAL | Status: DC | PRN

## 2020-12-02 MED ORDER — ONDANSETRON HCL 4 MG/2ML IJ SOLN: 4.0000 mg | Freq: Once | INTRAMUSCULAR | Status: DC | PRN

## 2020-12-02 MED ORDER — PHENYLEPHRINE 40 MCG/ML (10ML) SYRINGE FOR IV PUSH (FOR BLOOD PRESSURE SUPPORT)
PREFILLED_SYRINGE | INTRAVENOUS | Status: DC | PRN
  Administered 2020-12-02: 120 ug via INTRAVENOUS
  Administered 2020-12-02: 80 ug via INTRAVENOUS

## 2020-12-02 MED ORDER — IOHEXOL 350 MG/ML SOLN
100.0000 mL | Freq: Once | INTRAVENOUS | Status: AC | PRN
  Administered 2020-12-02: 25 mL via INTRA_ARTERIAL

## 2020-12-02 MED ORDER — CHLORHEXIDINE GLUCONATE 0.12 % MT SOLN
OROMUCOSAL | Status: AC
  Administered 2020-12-02: 15 mL via OROMUCOSAL
  Filled 2020-12-02: qty 15

## 2020-12-02 MED ORDER — ONDANSETRON HCL 4 MG/2ML IJ SOLN
4.0000 mg | INTRAMUSCULAR | Status: DC | PRN
  Administered 2020-12-03 – 2020-12-04 (×2): 4 mg via INTRAVENOUS
  Filled 2020-12-02 (×2): qty 2

## 2020-12-02 MED ORDER — MORPHINE SULFATE (PF) 2 MG/ML IV SOLN
1.0000 mg | INTRAVENOUS | Status: DC | PRN
Start: 2020-12-02 — End: 2020-12-04
  Administered 2020-12-02 – 2020-12-03 (×2): 2 mg via INTRAVENOUS
  Filled 2020-12-02 (×2): qty 1

## 2020-12-02 MED ORDER — CLEVIDIPINE BUTYRATE 0.5 MG/ML IV EMUL
INTRAVENOUS | Status: DC | PRN
  Administered 2020-12-02: 4 mg/h via INTRAVENOUS

## 2020-12-02 MED ORDER — LACTATED RINGERS IV SOLN: INTRAVENOUS | Status: DC

## 2020-12-02 MED ORDER — SODIUM CHLORIDE 0.9 % IV SOLN
INTRAVENOUS | Status: DC | PRN
Start: 2020-12-02 — End: 2020-12-02

## 2020-12-02 MED ORDER — ONDANSETRON HCL 4 MG PO TABS
4.0000 mg | ORAL_TABLET | ORAL | Status: DC | PRN
  Administered 2020-12-02 – 2020-12-04 (×5): 4 mg via ORAL
  Filled 2020-12-02 (×5): qty 1

## 2020-12-02 MED ORDER — SODIUM CHLORIDE 0.9 % IV SOLN: INTRAVENOUS | Status: DC

## 2020-12-02 MED ORDER — CEFAZOLIN SODIUM-DEXTROSE 2-4 GM/100ML-% IV SOLN
INTRAVENOUS | Status: AC
  Filled 2020-12-02: qty 100

## 2020-12-02 MED ORDER — OXYCODONE HCL 5 MG/5ML PO SOLN: 5.0000 mg | Freq: Once | ORAL | Status: DC | PRN

## 2020-12-02 MED ORDER — HEPARIN SODIUM (PORCINE) 1000 UNIT/ML IJ SOLN
INTRAMUSCULAR | Status: DC | PRN
  Administered 2020-12-02: 5000 [IU] via INTRAVENOUS
  Administered 2020-12-02: 1000 [IU] via INTRAVENOUS

## 2020-12-02 MED ORDER — ONDANSETRON HCL 4 MG/2ML IJ SOLN
INTRAMUSCULAR | Status: DC | PRN
  Administered 2020-12-02: 4 mg via INTRAVENOUS

## 2020-12-02 MED ORDER — ATORVASTATIN CALCIUM 80 MG PO TABS
80.0000 mg | ORAL_TABLET | Freq: Every day | ORAL | Status: DC
  Administered 2020-12-02 – 2020-12-03 (×2): 80 mg via ORAL
  Filled 2020-12-02 (×2): qty 1

## 2020-12-02 MED ORDER — CHLORHEXIDINE GLUCONATE 0.12 % MT SOLN: 15.0000 mL | Freq: Once | OROMUCOSAL | Status: AC

## 2020-12-02 MED ORDER — FENTANYL CITRATE (PF) 250 MCG/5ML IJ SOLN
INTRAMUSCULAR | Status: DC | PRN
  Administered 2020-12-02: 100 ug via INTRAVENOUS

## 2020-12-02 MED ORDER — SENNOSIDES-DOCUSATE SODIUM 8.6-50 MG PO TABS: 1.0000 | ORAL_TABLET | Freq: Every evening | ORAL | Status: DC | PRN

## 2020-12-02 MED ORDER — MIDAZOLAM HCL 2 MG/2ML IJ SOLN
INTRAMUSCULAR | Status: DC | PRN
  Administered 2020-12-02: 2 mg via INTRAVENOUS

## 2020-12-02 MED ORDER — PROPOFOL 10 MG/ML IV BOLUS
INTRAVENOUS | Status: DC | PRN
  Administered 2020-12-02: 130 mg via INTRAVENOUS

## 2020-12-02 MED ORDER — ALBUTEROL SULFATE (2.5 MG/3ML) 0.083% IN NEBU: 2.5000 mg | INHALATION_SOLUTION | RESPIRATORY_TRACT | Status: DC | PRN

## 2020-12-02 MED ORDER — ACETAMINOPHEN 160 MG/5ML PO SOLN: 325.0000 mg | ORAL | Status: DC | PRN

## 2020-12-02 MED ORDER — MEPERIDINE HCL 25 MG/ML IJ SOLN: 6.2500 mg | INTRAMUSCULAR | Status: DC | PRN

## 2020-12-02 MED ORDER — ROCURONIUM BROMIDE 10 MG/ML (PF) SYRINGE
PREFILLED_SYRINGE | INTRAVENOUS | Status: DC | PRN
  Administered 2020-12-02 (×2): 10 mg via INTRAVENOUS
  Administered 2020-12-02: 80 mg via INTRAVENOUS

## 2020-12-02 MED ORDER — VITAMIN D3 25 MCG (1000 UNIT) PO TABS
1000.0000 [IU] | ORAL_TABLET | Freq: Every morning | ORAL | Status: DC
  Administered 2020-12-03 – 2020-12-04 (×2): 1000 [IU] via ORAL
  Filled 2020-12-02 (×4): qty 1

## 2020-12-02 MED ORDER — FENTANYL CITRATE (PF) 100 MCG/2ML IJ SOLN
INTRAMUSCULAR | Status: AC
  Administered 2020-12-02: 50 ug via INTRAVENOUS
  Filled 2020-12-02: qty 2

## 2020-12-02 MED ORDER — LABETALOL HCL 5 MG/ML IV SOLN: 10.0000 mg | INTRAVENOUS | Status: DC | PRN

## 2020-12-02 MED ORDER — AMLODIPINE BESYLATE 5 MG PO TABS: 5.0000 mg | ORAL_TABLET | Freq: Every day | ORAL | Status: DC

## 2020-12-02 MED ORDER — HYDROCODONE-ACETAMINOPHEN 5-325 MG PO TABS
1.0000 | ORAL_TABLET | ORAL | Status: DC | PRN
  Administered 2020-12-02 – 2020-12-04 (×7): 1 via ORAL
  Filled 2020-12-02 (×8): qty 1

## 2020-12-02 MED ORDER — ACETAMINOPHEN 325 MG PO TABS: 325.0000 mg | ORAL_TABLET | ORAL | Status: DC | PRN

## 2020-12-02 MED ORDER — DICLOFENAC SODIUM 1 % TD GEL: 4.0000 g | Freq: Four times a day (QID) | TRANSDERMAL | Status: DC

## 2020-12-02 MED ORDER — ALBUTEROL SULFATE HFA 108 (90 BASE) MCG/ACT IN AERS: 1.0000 | INHALATION_SPRAY | RESPIRATORY_TRACT | Status: DC | PRN

## 2020-12-02 MED ORDER — SUGAMMADEX SODIUM 200 MG/2ML IV SOLN
INTRAVENOUS | Status: DC | PRN
  Administered 2020-12-02: 400 mg via INTRAVENOUS

## 2020-12-02 MED ORDER — PANTOPRAZOLE SODIUM 40 MG PO TBEC
40.0000 mg | DELAYED_RELEASE_TABLET | Freq: Every day | ORAL | Status: DC
  Administered 2020-12-03 – 2020-12-04 (×2): 40 mg via ORAL
  Filled 2020-12-02 (×2): qty 1

## 2020-12-02 MED ORDER — ORAL CARE MOUTH RINSE: 15.0000 mL | Freq: Once | OROMUCOSAL | Status: AC

## 2020-12-02 MED ORDER — ESTRADIOL 0.1 MG/GM VA CREA: 1.0000 | TOPICAL_CREAM | VAGINAL | Status: DC

## 2020-12-02 MED ORDER — CLOPIDOGREL BISULFATE 75 MG PO TABS
75.0000 mg | ORAL_TABLET | Freq: Every day | ORAL | Status: DC
  Administered 2020-12-03 – 2020-12-04 (×2): 75 mg via ORAL
  Filled 2020-12-02 (×2): qty 1

## 2020-12-02 MED ORDER — ASPIRIN 325 MG PO TABS
325.0000 mg | ORAL_TABLET | Freq: Every day | ORAL | Status: DC
  Administered 2020-12-03 – 2020-12-04 (×2): 325 mg via ORAL
  Filled 2020-12-02 (×2): qty 1

## 2020-12-02 NOTE — H&P (Signed)
Chief Complaint  Aneurysm  History of Present Illness  Tina Navarro is a 60 y.o. female with a history of significant peripheral vascular disease having undergone multiple previous iliac angioplasty procedures.  She was being worked up for a left subclavian stenosis and had incidental discovery of a basilar aneurysm.  She underwent further work-up with diagnostic cerebral angiogram a few weeks ago confirming the presence of a proximal anteriorly projecting basilar aneurysm.  She presents today for treatment of the aneurysm with a flow diverter.  She has been taking daily aspirin and Plavix including a dose this morning.  She does not report any abnormal bleeding.  Past Medical History   Past Medical History:  Diagnosis Date   Emphysematous COPD (HCC) 04/23/2017   GERD (gastroesophageal reflux disease)    Hyperlipidemia    Hypertension    Peripheral arterial disease (HCC)    Thyroid nodule 04/23/2017    Past Surgical History   Past Surgical History:  Procedure Laterality Date   ABDOMINAL AORTOGRAM N/A 07/16/2017   Procedure: ABDOMINAL AORTOGRAM;  Surgeon: Nada Libman, MD;  Location: [facility];  Service: Cardiovascular;  Laterality: N/A;   ABDOMINAL AORTOGRAM W/LOWER EXTREMITY Bilateral 12/02/2018   Procedure: ABDOMINAL AORTOGRAM W/LOWER EXTREMITY;  Surgeon: Nada Libman, MD;  Location: [facility];  Service: Cardiovascular;  Laterality: Bilateral;   AORTIC ARCH ANGIOGRAPHY N/A 12/02/2018   Procedure: AORTIC ARCH ANGIOGRAPHY;  Surgeon: Nada Libman, MD;  Location: [facility];  Service: Cardiovascular;  Laterality: N/A;   ENDOMETRIAL BIOPSY  2005   FEMORAL ARTERY STENT Bilateral 2008   IR ANGIO INTRA EXTRACRAN SEL INTERNAL CAROTID UNI R MOD SED  11/08/2020   IR ANGIO VERTEBRAL SEL SUBCLAVIAN INNOMINATE UNI R MOD SED  11/08/2020   IR US GUIDE VASC ACCESS RIGHT  11/08/2020   LOWER EXTREMITY ANGIOGRAPHY Bilateral 07/16/2017   Procedure: Lower Extremity  Angiography;  Surgeon: Nada Libman, MD;  Location: [facility];  Service: Cardiovascular;  Laterality: Bilateral;   NASAL SINUS SURGERY     PERIPHERAL VASCULAR BALLOON ANGIOPLASTY Bilateral 07/16/2017   Procedure: PERIPHERAL VASCULAR BALLOON ANGIOPLASTY;  Surgeon: Nada Libman, MD;  Location: [facility];  Service: Cardiovascular;  Laterality: Bilateral;  iliacs   PERIPHERAL VASCULAR INTERVENTION Bilateral 12/02/2018   Procedure: PERIPHERAL VASCULAR INTERVENTION;  Surgeon: Nada Libman, MD;  Location: [facility];  Service: Cardiovascular;  Laterality: Bilateral;   TONSILLECTOMY     TUBAL LIGATION      Social History   Social History   Tobacco Use   Smoking status: Former    Packs/day: 0.50    Years: 35.00    Pack years: 17.50    Types: Cigarettes    Quit date: 12/18/2017    Years since quitting: 2.9   Smokeless tobacco: Never   Tobacco comments:    As per pt - smoking 1/2 pk daily  Vaping Use   Vaping Use: Never used  Substance Use Topics   Alcohol use: No   Drug use: No    Medications   Prior to Admission medications   Medication Sig Start Date End Date Taking? Authorizing Provider  acetaminophen (TYLENOL) 500 MG tablet Take 1,000 mg by mouth every 6 (six) hours as needed for headache.    [provider]  albuterol (VENTOLIN HFA) 108 (90 Base) MCG/ACT inhaler Inhale 1-2 puffs into the lungs every 4 (four) hours as needed for wheezing or shortness of breath. 09/01/20  Sunnie Nielsen, DO  amLODipine (NORVASC) 5 MG tablet Take 1 tablet (5 mg total) by mouth daily. Patient taking differently: Take 5 mg by mouth at bedtime. 09/07/20 12/06/20  Lewayne Bunting, MD  aspirin 325 MG tablet Take 325 mg by mouth daily.    [provider]  Aspirin-Salicylamide-Caffeine (BC HEADACHE POWDER PO) Take 1 packet by mouth daily as needed (for headache).     [provider]  atorvastatin (LIPITOR) 80 MG tablet Take 1 tablet (80 mg  total) by mouth at bedtime. 09/07/20   Lewayne Bunting, MD  Cholecalciferol (VITAMIN D3 PO) Take 1 tablet by mouth in the morning.    [provider]  clobetasol ointment (TEMOVATE) 0.05 % APPLY TO THE AFFECTED AREA(S) TOPICALLY TWICE DAILY AS NEEDED FOR A MAX OF TWO WEEKS TO AVOID WHITENING/THINNING SKIN. Patient not taking: No sig reported 11/09/19   Sunnie Nielsen, DO  clopidogrel (PLAVIX) 75 MG tablet Take 1 tablet (75 mg total) by mouth daily. 09/26/20   Sunnie Nielsen, DO  diclofenac sodium (VOLTAREN) 1 % GEL Apply 4 g topically 4 (four) times daily. To affected joint. Patient not taking: Reported on 11/24/2020 11/18/18   Rodolph Bong, MD  estradiol (ESTRACE) 0.1 MG/GM vaginal cream Place 1 Applicatorful vaginally 3 (three) times a week. Patient not taking: No sig reported 10/07/19   Sunnie Nielsen, DO  losartan (COZAAR) 100 MG tablet Take 1 tablet (100 mg total) by mouth daily. 09/01/20   Sunnie Nielsen, DO  omeprazole (PRILOSEC) 40 MG capsule Take 1 capsule (40 mg total) by mouth daily. 09/01/20   Sunnie Nielsen, DO  promethazine (PHENERGAN) 12.5 MG tablet Take 1 tablet (12.5 mg total) by mouth every 4 (four) hours as needed for nausea. 04/01/20   Sunnie Nielsen, DO  senna (SENOKOT) 8.6 MG tablet Take 2 tablets (17.2 mg total) by mouth daily. As needed for constipation Patient taking differently: Take 2 tablets by mouth at bedtime. 04/29/17   Sunnie Nielsen, DO  vitamin B-12 (CYANOCOBALAMIN) 1000 MCG tablet Take 1,000 mcg by mouth daily.     [provider]    Allergies   Allergies  Allergen Reactions   Doxycycline Hives, Rash and Other (See Comments)    Pain, photosensitive    Review of Systems  ROS  Neurologic Exam  Awake, alert, oriented Memory and concentration grossly intact Speech fluent, appropriate CN grossly intact Motor exam: Upper Extremities Deltoid Bicep Tricep Grip  Right 5/5 5/5 5/5 5/5  Left 5/5 5/5 5/5 5/5   Lower  Extremities IP Quad PF DF EHL  Right 5/5 5/5 5/5 5/5 5/5  Left 5/5 5/5 5/5 5/5 5/5   Sensation grossly intact to LT  Imaging  Previous angiogram was again reviewed and demonstrates an anteriorly projecting aneurysm arising from the vertebrobasilar junction.  Impression  - 60 y.o. 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  Plan  -We will plan on proceeding with surpass embolization of the basilar aneurysm  I have reviewed the details of the procedure as well as the expected postoperative course and recovery.  We have also discussed the associated risks, benefits, and alternative treatment strategies with the patient and her husband in the office.  All her questions today were answered.  She provided informed consent to proceed.   Lisbeth Renshaw, MD [facility]

## 2020-12-02 NOTE — Brief Op Note (Signed)
  NEUROSURGERY BRIEF OPERATIVE  NOTE   PREOP DX: Basilar aneurysm  POSTOP DX: Same  PROCEDURE: Stent-supported coil embolization of basilar aneurysm  SURGEON: Dr. Lisbeth Renshaw, MD  ANESTHESIA: GETA  EBL: Minimal  SPECIMENS: None  COMPLICATIONS: None  CONDITION: Stable to recovery  FINDINGS (Full report in [facility]): 1. Successful stent-supported coil embolization of proximal basilar aneurysm   Lisbeth Renshaw, MD [facility]

## 2020-12-02 NOTE — Transfer of Care (Signed)
Immediate Anesthesia Transfer of Care Note  Patient: Aleya Durnell  Procedure(s) Performed: Arteriogram, Surpass embolization of basilar aneurysm  Patient Location: PACU  Anesthesia Type:General  Level of Consciousness: drowsy, patient cooperative and responds to stimulation  Airway & Oxygen Therapy: Patient Spontanous Breathing  Post-op Assessment: Report given to RN and Post -op Vital signs reviewed and stable  Post vital signs: Reviewed and stable  Last Vitals:  Vitals Value Taken Time  BP 135/49 12/02/20 1512  Temp 36.1 C 12/02/20 1512  Pulse 90 12/02/20 1514  Resp 14 12/02/20 1514  SpO2 93 % 12/02/20 1514  Vitals shown include unvalidated device data.  Last Pain:  Vitals:   12/02/20 0942  TempSrc:   PainSc: 0-No pain         Complications: No notable events documented.

## 2020-12-02 NOTE — H&P (Deleted)
  The note originally documented on this encounter has been moved the the encounter in which it belongs.  

## 2020-12-02 NOTE — Sedation Documentation (Addendum)
Right femoral sheath removed, 53fr. exoseal closure device deployed by [employer].

## 2020-12-02 NOTE — Anesthesia Postprocedure Evaluation (Signed)
Anesthesia Post Note  Patient: Tina Navarro  Procedure(s) Performed: Arteriogram, Surpass embolization of basilar aneurysm     Patient location during evaluation: PACU Anesthesia Type: General Level of consciousness: awake and alert Pain management: pain level controlled Vital Signs Assessment: post-procedure vital signs reviewed and stable Respiratory status: spontaneous breathing, nonlabored ventilation, respiratory function stable and patient connected to nasal cannula oxygen Cardiovascular status: blood pressure returned to baseline and stable Postop Assessment: no apparent nausea or vomiting Anesthetic complications: no   No notable events documented.  Last Vitals:  Vitals:   12/02/20 1526 12/02/20 1541  BP: 129/60 (!) 122/57  Pulse: 82 82  Resp: 16 16  Temp:    SpO2: 96% 91%    Last Pain:  Vitals:   12/02/20 1541  TempSrc:   PainSc: Asleep                 Alyzabeth Pontillo

## 2020-12-03 MED ORDER — AMLODIPINE BESYLATE 5 MG PO TABS
5.0000 mg | ORAL_TABLET | Freq: Every day | ORAL | Status: DC
  Administered 2020-12-03: 5 mg via ORAL
  Filled 2020-12-03: qty 1

## 2020-12-03 MED ORDER — ACETAMINOPHEN 325 MG PO TABS
650.0000 mg | ORAL_TABLET | Freq: Once | ORAL | Status: AC
  Administered 2020-12-03: 650 mg via ORAL
  Filled 2020-12-03: qty 2

## 2020-12-03 MED ORDER — CHLORHEXIDINE GLUCONATE CLOTH 2 % EX PADS
6.0000 | MEDICATED_PAD | Freq: Every day | CUTANEOUS | Status: DC
  Administered 2020-12-03: 6 via TOPICAL

## 2020-12-03 NOTE — Progress Notes (Signed)
Meal tray set up for pt. Pt reports improved nausea.

## 2020-12-03 NOTE — Progress Notes (Signed)
   Providing Compassionate, Quality Care - Together  NEUROSURGERY PROGRESS NOTE   S: No issues overnight.  Persistent headache  O: EXAM:  BP (!) 111/54   Pulse 85   Temp 98.4 F (36.9 C) (Oral)   Resp 12   Ht 5\' 5"  (1.651 m)   Wt 81.2 kg   LMP  (LMP Unknown)   SpO2 92%   BMI 29.79 kg/m   Awake, alert, oriented x3 PERRLA Speech fluent, appropriate  CNs grossly intact  5/5 BUE/BLE  Right groin dressing clean dry and intact, no signs of hematoma  ASSESSMENT:  60 y.o. female with   Basilar aneurysm  -Status post stent coil basilar aneurysm  PLAN: -Mobilize -Pain control -DC planning pending progression today    Thank you for allowing me to participate in this patient's care.  Please do not hesitate to call with questions or concerns.   67, DO Neurosurgeon [facility] Cell: [phone]

## 2020-12-04 MED ORDER — TRAMADOL HCL 50 MG PO TABS: 50.0000 mg | ORAL_TABLET | Freq: Four times a day (QID) | ORAL | 0 refills | Status: DC | PRN

## 2020-12-04 MED ORDER — ACETAMINOPHEN 325 MG PO TABS: 650.0000 mg | ORAL_TABLET | ORAL | Status: DC | PRN

## 2020-12-04 MED ORDER — TRAMADOL HCL 50 MG PO TABS
50.0000 mg | ORAL_TABLET | Freq: Four times a day (QID) | ORAL | Status: DC | PRN
  Administered 2020-12-04: 50 mg via ORAL
  Filled 2020-12-04: qty 1

## 2020-12-04 MED ORDER — PROMETHAZINE HCL 12.5 MG PO TABS: 12.5000 mg | ORAL_TABLET | ORAL | 1 refills | Status: DC | PRN

## 2020-12-04 NOTE — Progress Notes (Signed)
   Providing Compassionate, Quality Care - Together  NEUROSURGERY PROGRESS NOTE   S: No issues overnight. Still some nausea, and HA  O: EXAM:  BP 134/76   Pulse 91   Temp 98.3 F (36.8 C) (Oral)   Resp 20   Ht 5\' 5"  (1.651 m)   Wt 81.2 kg   LMP  (LMP Unknown)   SpO2 96%   BMI 29.79 kg/m   Awake, alert, oriented x3 PERRL Speech fluent, appropriate  CNs grossly intact  5/5 BUE/BLE  Right groin dressing clean dry and intact, no signs of hematoma   ASSESSMENT:  60 y.o. female with    Basilar aneurysm   -Status post stent coil basilar aneurysm   PLAN: -Mobilize -Pain control -DC planning pending progression today -trying tramadol instead     Thank you for allowing me to participate in this patient's care.  Please do not hesitate to call with questions or concerns.   67, DO Neurosurgeon [facility] Cell: [phone]

## 2020-12-04 NOTE — Discharge Summary (Signed)
Physician Discharge Summary  Patient ID: Tina Navarro MRN: [id number] DOB/AGE: 09-26-1960 60 y.o.  Admit date: 12/02/2020 Discharge date: 12/04/2020  Admission Diagnoses:  Basilar aneurysm  Discharge Diagnoses:  Same Active Problems:   Aneurysm, cerebral, nonruptured   Discharged Condition: Stable  Hospital Course:  Rekha Hobbins is a 60 y.o. female that presented for an elective endovascular treatment of her basilar aneurysm on 12/02/2020.  She tolerated the procedure well.  Postoperatively she was monitored in the neuro ICU and at her neurologic baseline.  She was ambulating well, her pain was controlled on oral medication.  Her groin site was stable with no sign of hematoma.  She was tolerating a normal diet and having normal bowel bladder function upon discharge.  Treatments: Surgery -stent coil embolization of basilar aneurysm  Discharge Exam: Blood pressure 134/76, pulse 91, temperature 98.3 F (36.8 C), temperature source Oral, resp. rate 20, height 5\' 5"  (1.651 m), weight 81.2 kg, SpO2 96 %. Awake, alert, oriented x3 PERRLA Speech fluent, appropriate CN grossly intact 5/5 BUE/BLE Groin site soft and c/d/i  Disposition: Discharge disposition: 01-Home or Self Care        Allergies as of 12/04/2020       Reactions   Doxycycline Hives, Rash, Other (See Comments)   Pain, photosensitive        Medication List     TAKE these medications    acetaminophen 500 MG tablet Commonly known as: TYLENOL Take 1,000 mg by mouth every 6 (six) hours as needed for headache.   albuterol 108 (90 Base) MCG/ACT inhaler Commonly known as: VENTOLIN HFA Inhale 1-2 puffs into the lungs every 4 (four) hours as needed for wheezing or shortness of breath.   amLODipine 5 MG tablet Commonly known as: NORVASC Take 1 tablet (5 mg total) by mouth daily. What changed: when to take this   aspirin 325 MG tablet Take 325 mg by mouth daily.   atorvastatin 80 MG tablet Commonly  known as: LIPITOR Take 1 tablet (80 mg total) by mouth at bedtime.   BC HEADACHE POWDER PO Take 1 packet by mouth daily as needed (for headache).   clopidogrel 75 MG tablet Commonly known as: PLAVIX Take 1 tablet (75 mg total) by mouth daily.   losartan 100 MG tablet Commonly known as: COZAAR Take 1 tablet (100 mg total) by mouth daily.   omeprazole 40 MG capsule Commonly known as: PRILOSEC Take 1 capsule (40 mg total) by mouth daily.   promethazine 12.5 MG tablet Commonly known as: PHENERGAN Take 1 tablet (12.5 mg total) by mouth every 4 (four) hours as needed for nausea or vomiting. What changed: reasons to take this   senna 8.6 MG tablet Commonly known as: SENOKOT Take 2 tablets (17.2 mg total) by mouth daily. As needed for constipation What changed:  when to take this additional instructions   traMADol 50 MG tablet Commonly known as: ULTRAM Take 1 tablet (50 mg total) by mouth every 6 (six) hours as needed for moderate pain.   vitamin B-12 1000 MCG tablet Commonly known as: CYANOCOBALAMIN Take 1,000 mcg by mouth daily.   VITAMIN D3 PO Take 1 tablet by mouth in the morning.        Follow-up Information     02/03/2021, MD Follow up in 2 week(s).   Specialty: Neurosurgery Contact information: [address] [place] [phone]  SignedAlan Mulder Nishawn Rotan 12/04/2020, 11:54 AM

## 2020-12-04 NOTE — Progress Notes (Signed)
Reviewed discharge instructions with patient and confirmed all belongings present. Answered all questions and discharged via wheelchair to her daughter's vehicle.

## 2020-12-05 ENCOUNTER — Encounter (HOSPITAL_COMMUNITY): Payer: Self-pay | Admitting: Neurosurgery

## 2020-12-15 DIAGNOSIS — I671 Cerebral aneurysm, nonruptured: Secondary | ICD-10-CM | POA: Diagnosis not present

## 2020-12-15 DIAGNOSIS — Z6829 Body mass index (BMI) 29.0-29.9, adult: Secondary | ICD-10-CM | POA: Diagnosis not present

## 2020-12-15 DIAGNOSIS — I10 Essential (primary) hypertension: Secondary | ICD-10-CM | POA: Diagnosis not present

## 2020-12-20 ENCOUNTER — Encounter: Payer: Self-pay | Admitting: *Deleted

## 2020-12-26 ENCOUNTER — Ambulatory Visit (INDEPENDENT_AMBULATORY_CARE_PROVIDER_SITE_OTHER): Payer: Federal, State, Local not specified - PPO | Admitting: Surgery

## 2020-12-26 ENCOUNTER — Encounter: Payer: Self-pay | Admitting: Surgery

## 2020-12-26 ENCOUNTER — Other Ambulatory Visit: Payer: Self-pay

## 2020-12-26 VITALS — BP 127/60 | HR 94 | Temp 98.3°F | Resp 20 | Ht 65.0 in | Wt 174.0 lb

## 2020-12-26 DIAGNOSIS — I708 Atherosclerosis of other arteries: Secondary | ICD-10-CM | POA: Diagnosis not present

## 2020-12-26 NOTE — Progress Notes (Signed)
[facility] [place]  Patient name: Tina Navarro MRN: [id number] DOB: February 18, 1961 Sex: female   REASON FOR VISIT:    Follow up  HISOTRY OF PRESENT ILLNESS:   Tina Navarro is a 60 y.o. female who has a history of percutaneous intervention to her legs in 2007.  She transferred to the [facility] health system.  I performed angiography on 07/16/2017 and performed angioplasty of bilateral common iliac stenosis.  She had recurrent symptoms shortly thereafter and on 12/02/2018 I placed bilateral common iliac VBX stents (8 x 39).  She has had issues with left arm weakness while washing her hair.  She has a known left subclavian stenosis which was confirmed with angiography.  Her symptoms have been tolerable however they have progressed.  I sent her for CT scan to better evaluate this for surgical reconstruction.  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  She is still having issues with left arm weakness and dizziness   PAST MEDICAL HISTORY:   Past Medical History:  Diagnosis Date   Emphysematous COPD (HCC) 04/23/2017   GERD (gastroesophageal reflux disease)    Hyperlipidemia    Hypertension    Peripheral arterial disease (HCC)    Thyroid nodule 04/23/2017     FAMILY HISTORY:   Family History  Problem Relation Age of Onset   Hypertension Mother    Hypertension Father    Heart disease Father    Peripheral vascular disease Father    Alcohol abuse Father    Fibroids Sister    Cancer Maternal Grandmother        BREAST   Stroke Maternal Grandmother        CEBERAL ATHEROSCLEROSIS   Cancer - Ovarian Cousin     SOCIAL HISTORY:   Social History   Tobacco Use   Smoking status: Former    Packs/day: 0.50    Years: 35.00    Pack years: 17.50    Types: Cigarettes    Quit date: 12/18/2017    Years since quitting: 3.0   Smokeless tobacco: Never   Tobacco comments:    As per pt -  smoking 1/2 pk daily  Substance Use Topics   Alcohol use: No     ALLERGIES:   Allergies  Allergen Reactions   Doxycycline Hives, Rash and Other (See Comments)    Pain, photosensitive     CURRENT MEDICATIONS:   Current Outpatient Medications  Medication Sig Dispense Refill   acetaminophen (TYLENOL) 500 MG tablet Take 1,000 mg by mouth every 6 (six) hours as needed for headache.     albuterol (VENTOLIN HFA) 108 (90 Base) MCG/ACT inhaler Inhale 1-2 puffs into the lungs every 4 (four) hours as needed for wheezing or shortness of breath. 1 each 2   aspirin 325 MG tablet Take 325 mg by mouth daily.     Aspirin-Salicylamide-Caffeine (BC HEADACHE POWDER PO) Take 1 packet by mouth daily as needed (for headache).      atorvastatin (LIPITOR) 80 MG tablet Take 1 tablet (80 mg total) by mouth at bedtime. 90 tablet 3   Cholecalciferol (VITAMIN D3 PO) Take 1 tablet by mouth in the morning.     clopidogrel (PLAVIX) 75 MG tablet Take 1 tablet (75 mg total) by mouth daily. 90 tablet 1   losartan (COZAAR) 100 MG tablet Take 1 tablet (100 mg total) by mouth daily. 90 tablet 3   omeprazole (PRILOSEC) 40 MG capsule Take 1 capsule (40 mg total) by mouth  daily. 90 capsule 3   promethazine (PHENERGAN) 12.5 MG tablet Take 1 tablet (12.5 mg total) by mouth every 4 (four) hours as needed for nausea or vomiting. 30 tablet 1   senna (SENOKOT) 8.6 MG tablet Take 2 tablets (17.2 mg total) by mouth daily. As needed for constipation (Patient taking differently: Take 2 tablets by mouth at bedtime.) 90 tablet 1   traMADol (ULTRAM) 50 MG tablet Take 1 tablet (50 mg total) by mouth every 6 (six) hours as needed for moderate pain. 30 tablet 0   vitamin B-12 (CYANOCOBALAMIN) 1000 MCG tablet Take 1,000 mcg by mouth daily.      amLODipine (NORVASC) 5 MG tablet Take 1 tablet (5 mg total) by mouth daily. (Patient taking differently: Take 5 mg by mouth at bedtime.) 180 tablet 3   No current facility-administered medications  for this visit.    REVIEW OF SYSTEMS:   [X]  denotes positive finding, [ ]  denotes negative finding Cardiac  Comments:  Chest pain or chest pressure:    Shortness of breath upon exertion:    Short of breath when lying flat:    Irregular heart rhythm:        Vascular    Pain in calf, thigh, or hip brought on by ambulation:    Pain in feet at night that wakes you up from your sleep:     Blood clot in your veins:    Leg swelling:         Pulmonary    Oxygen at home:    Productive cough:     Wheezing:         Neurologic    Sudden weakness in arms or legs:     Sudden numbness in arms or legs:     Sudden onset of difficulty speaking or slurred speech:    Temporary loss of vision in one eye:     Problems with dizziness:  x       Gastrointestinal    Blood in stool:     Vomited blood:         Genitourinary    Burning when urinating:     Blood in urine:        Psychiatric    Major depression:         Hematologic    Bleeding problems:    Problems with blood clotting too easily:        Skin    Rashes or ulcers:        Constitutional    Fever or chills:      PHYSICAL EXAM:   Vitals:   12/26/20 1419  BP: 127/60  Pulse: 94  Resp: 20  Temp: 98.3 F (36.8 C)  SpO2: 98%  Weight: 174 lb (78.9 kg)  Height: 5\' 5"  (1.651 m)    GENERAL: The patient is a well-nourished female, in no acute distress. The vital signs are documented above. CARDIAC: There is a regular rate and rhythm.  VASCULAR: Nonpalpable left radial pulse PULMONARY: Non-labored respirations ABDOMEN: Soft and non-tender with normal pitched bowel sounds.  MUSCULOSKELETAL: There are no major deformities or cyanosis. NEUROLOGIC: No focal weakness or paresthesias are detected. SKIN: There are no ulcers or rashes noted. PSYCHIATRIC: The patient has a normal affect.  STUDIES:   I have reviewed the angiogram as well as CT scan showing left subclavian occlusion with reconstitution  MEDICAL ISSUES:    Symptomatic left subclavian steal syndrome: The patient is having vertebrobasilar insufficiency with dizziness from activities as  well as severe left arm claudication.  We were originally going to schedule her operation several months ago however on CT scan she was found to have a basilar artery aneurysm which has subsequently been repaired.  Also on the CT scan there was concern over carotid stenosis.  The ultrasound did not show any significant stenosis and so I think this is over estimation by CT scan given the amount of calcification.  I have discussed proceeding with a left carotid to subclavian bypass graft.  We discussed the risk of lymphatic leak, phrenic nerve injury, bleeding and stroke.  All questions were answered.  I would like for her to be off of her Plavix before surgery.  She needs to be on this another few months given her recent neurosurgical procedure.  Therefore I have her scheduled for December 28, having stop her Plavix 5 days prior.    Charlena Cross, MD, FACS [facility] [phone] Pager [phone]

## 2020-12-30 ENCOUNTER — Other Ambulatory Visit: Payer: Self-pay

## 2020-12-30 DIAGNOSIS — I708 Atherosclerosis of other arteries: Secondary | ICD-10-CM

## 2021-01-12 DIAGNOSIS — R072 Precordial pain: Secondary | ICD-10-CM | POA: Diagnosis not present

## 2021-01-13 LAB — LIPID PANEL
Cholesterol: 152 mg/dL (ref <200)
HDL: 39 mg/dL — ABNORMAL LOW (ref >=50)
LDL Cholesterol (Calc): 76 mg/dL (calc)
Non-HDL Cholesterol (Calc): 113 mg/dL (calc) (ref <130)
Total CHOL/HDL Ratio: 3.9 (calc) (ref <5.0)
Triglycerides: 282 mg/dL — ABNORMAL HIGH (ref <150)

## 2021-01-13 LAB — HEPATIC FUNCTION PANEL
AG Ratio: 1.7 (calc) (ref 1.0–2.5)
ALT: 15 U/L (ref 6–29)
AST: 23 U/L (ref 10–35)
Albumin: 3.9 g/dL (ref 3.6–5.1)
Alkaline phosphatase (APISO): 120 U/L (ref 37–153)
Bilirubin, Direct: 0 mg/dL (ref 0.0–0.2)
Globulin: 2.3 g/dL (calc) (ref 1.9–3.7)
Indirect Bilirubin: 0.3 mg/dL (calc) (ref 0.2–1.2)
Total Bilirubin: 0.3 mg/dL (ref 0.2–1.2)
Total Protein: 6.2 g/dL (ref 6.1–8.1)

## 2021-02-06 ENCOUNTER — Encounter: Payer: Self-pay | Admitting: Surgery

## 2021-02-17 NOTE — Pre-Procedure Instructions (Signed)
Surgical Instructions    Your procedure is scheduled on Wednesday 02/22/21.   Report to [facility] Main Entrance "A" at 08:00 A.M., then check in with the Admitting office.  Call this number if you have problems the morning of surgery:  [phone]   If you have any questions prior to your surgery date call [phone]: Open Monday-Friday 8am-4pm    Remember:  Do not eat or drink after midnight the night before your surgery    Take these medicines the morning of surgery with A SIP OF WATER   omeprazole (PRILOSEC)   Take these medicines if needed:   acetaminophen (TYLENOL)  albuterol (VENTOLIN HFA)  promethazine (PHENERGAN)   Please follow your surgeon's instructions regarding clopidogrel (PLAVIX). If you have not received instructions then please contact your surgeon's office for instructions.   As of today, STOP taking any Aspirin (unless otherwise instructed by your surgeon) Aleve, Naproxen, Ibuprofen, Motrin, Advil, Goody's, BC's, all herbal medications, fish oil, and all vitamins.     After your COVID test   You are not required to quarantine however you are required to wear a well-fitting mask when you are out and around people not in your household.  If your mask becomes wet or soiled, replace with a new one.  Wash your hands often with soap and water for 20 seconds or clean your hands with an alcohol-based hand sanitizer that contains at least 60% alcohol.  Do not share personal items.  Notify your provider: if you are in close contact with someone who has COVID  or if you develop a fever of 100.4 or greater, sneezing, cough, sore throat, shortness of breath or body aches.             Do not wear jewelry or makeup Do not wear lotions, powders, perfumes/colognes, or deodorant. Do not shave 48 hours prior to surgery.  Men may shave face and neck. Do not bring valuables to the hospital. DO Not wear nail polish, gel polish, artificial nails, or any other type of  covering on natural nails including finger and toenails. If patients have artificial nails, gel coating, etc. that need to be removed by a nail salon, please have this removed prior to surgery or surgery may need to be canceled/delayed if the surgeon/ anesthesia feels like the patient is unable to be adequately monitored.             [facility] is not responsible for any belongings or valuables.  Do NOT Smoke (Tobacco/Vaping)  24 hours prior to your procedure  If you use a CPAP at night, you may bring your mask for your overnight stay.   Contacts, glasses, hearing aids, dentures or partials may not be worn into surgery, please bring cases for these belongings   For patients admitted to the hospital, discharge time will be determined by your treatment team.   Patients discharged the day of surgery will not be allowed to drive home, and someone needs to stay with them for 24 hours.  NO VISITORS WILL BE ALLOWED IN PRE-OP WHERE PATIENTS ARE PREPPED FOR SURGERY.  ONLY 1 SUPPORT PERSON MAY BE PRESENT IN THE WAITING ROOM WHILE YOU ARE IN SURGERY.  IF YOU ARE TO BE ADMITTED, ONCE YOU ARE IN YOUR ROOM YOU WILL BE ALLOWED TWO (2) VISITORS. 1 (ONE) VISITOR MAY STAY OVERNIGHT BUT MUST ARRIVE TO THE ROOM BY 8pm.  Minor children may have two parents present. Special consideration for safety and communication needs will be  reviewed on a case by case basis.  Special instructions:    Oral Hygiene is also important to reduce your risk of infection.  Remember - BRUSH YOUR TEETH THE MORNING OF SURGERY WITH YOUR REGULAR TOOTHPASTE   [place]- Preparing For Surgery  Before surgery, you can play an important role. Because skin is not sterile, your skin needs to be as free of germs as possible. You can reduce the number of germs on your skin by washing with CHG (chlorahexidine gluconate) Soap before surgery.  CHG is an antiseptic cleaner which kills germs and bonds with the skin to continue killing germs even  after washing.     Please do not use if you have an allergy to CHG or antibacterial soaps. If your skin becomes reddened/irritated stop using the CHG.  Do not shave (including legs and underarms) for at least 48 hours prior to first CHG shower. It is OK to shave your face.  Please follow these instructions carefully.     Shower the NIGHT BEFORE SURGERY and the MORNING OF SURGERY with CHG Soap.   If you chose to wash your hair, wash your hair first as usual with your normal shampoo. After you shampoo, rinse your hair and body thoroughly to remove the shampoo.  Then [employer] and genitals (private parts) with your normal soap and rinse thoroughly to remove soap.  After that Use CHG Soap as you would any other liquid soap. You can apply CHG directly to the skin and wash gently with a scrungie or a clean washcloth.   Apply the CHG Soap to your body ONLY FROM THE NECK DOWN.  Do not use on open wounds or open sores. Avoid contact with your eyes, ears, mouth and genitals (private parts). Wash Face and genitals (private parts)  with your normal soap.   Wash thoroughly, paying special attention to the area where your surgery will be performed.  Thoroughly rinse your body with warm water from the neck down.  DO NOT shower/wash with your normal soap after using and rinsing off the CHG Soap.  Pat yourself dry with a CLEAN TOWEL.  Wear CLEAN PAJAMAS to bed the night before surgery  Place CLEAN SHEETS on your bed the night before your surgery  DO NOT SLEEP WITH PETS.   Day of Surgery:  Take a shower with CHG soap. Wear Clean/Comfortable clothing the morning of surgery Do not apply any deodorants/lotions.   Remember to brush your teeth WITH YOUR REGULAR TOOTHPASTE.   Please read over the following fact sheets that you were given.

## 2021-02-21 ENCOUNTER — Encounter (HOSPITAL_COMMUNITY): Payer: Self-pay

## 2021-02-21 ENCOUNTER — Other Ambulatory Visit: Payer: Self-pay

## 2021-02-21 ENCOUNTER — Encounter (HOSPITAL_COMMUNITY)
Admission: RE | Admit: 2021-02-21 | Discharge: 2021-02-21 | Disposition: A | Discharge status: Home / Self Care | Payer: Federal, State, Local not specified - PPO | Source: Ambulatory Visit | Attending: Surgery | Admitting: Surgery

## 2021-02-21 VITALS — BP 130/67 | HR 88 | Temp 97.8°F | Resp 17 | Ht 65.0 in | Wt 178.5 lb

## 2021-02-21 DIAGNOSIS — I708 Atherosclerosis of other arteries: Secondary | ICD-10-CM | POA: Insufficient documentation

## 2021-02-21 DIAGNOSIS — Z20822 Contact with and (suspected) exposure to covid-19: Secondary | ICD-10-CM | POA: Insufficient documentation

## 2021-02-21 DIAGNOSIS — J810 Acute pulmonary edema: Secondary | ICD-10-CM | POA: Diagnosis not present

## 2021-02-21 DIAGNOSIS — I739 Peripheral vascular disease, unspecified: Secondary | ICD-10-CM | POA: Insufficient documentation

## 2021-02-21 DIAGNOSIS — Z803 Family history of malignant neoplasm of breast: Secondary | ICD-10-CM | POA: Diagnosis not present

## 2021-02-21 DIAGNOSIS — Z7982 Long term (current) use of aspirin: Secondary | ICD-10-CM | POA: Diagnosis not present

## 2021-02-21 DIAGNOSIS — J439 Emphysema, unspecified: Secondary | ICD-10-CM | POA: Diagnosis not present

## 2021-02-21 DIAGNOSIS — I10 Essential (primary) hypertension: Secondary | ICD-10-CM | POA: Insufficient documentation

## 2021-02-21 DIAGNOSIS — Z881 Allergy status to other antibiotic agents status: Secondary | ICD-10-CM | POA: Diagnosis not present

## 2021-02-21 DIAGNOSIS — G8324 Monoplegia of upper limb affecting left nondominant side: Secondary | ICD-10-CM | POA: Diagnosis not present

## 2021-02-21 DIAGNOSIS — Z8249 Family history of ischemic heart disease and other diseases of the circulatory system: Secondary | ICD-10-CM | POA: Diagnosis not present

## 2021-02-21 DIAGNOSIS — J449 Chronic obstructive pulmonary disease, unspecified: Secondary | ICD-10-CM | POA: Insufficient documentation

## 2021-02-21 DIAGNOSIS — K219 Gastro-esophageal reflux disease without esophagitis: Secondary | ICD-10-CM | POA: Insufficient documentation

## 2021-02-21 DIAGNOSIS — E785 Hyperlipidemia, unspecified: Secondary | ICD-10-CM | POA: Insufficient documentation

## 2021-02-21 DIAGNOSIS — G450 Vertebro-basilar artery syndrome: Secondary | ICD-10-CM | POA: Diagnosis not present

## 2021-02-21 DIAGNOSIS — Z01818 Encounter for other preprocedural examination: Secondary | ICD-10-CM

## 2021-02-21 DIAGNOSIS — Z87891 Personal history of nicotine dependence: Secondary | ICD-10-CM | POA: Insufficient documentation

## 2021-02-21 DIAGNOSIS — Z01812 Encounter for preprocedural laboratory examination: Secondary | ICD-10-CM | POA: Insufficient documentation

## 2021-02-21 DIAGNOSIS — Z823 Family history of stroke: Secondary | ICD-10-CM | POA: Diagnosis not present

## 2021-02-21 HISTORY — DX: Aneurysm of other precerebral arteries: I72.5

## 2021-02-21 HISTORY — DX: Dyspnea, unspecified: R06.00

## 2021-02-21 HISTORY — DX: Anxiety disorder, unspecified: F41.9

## 2021-02-21 LAB — COMPREHENSIVE METABOLIC PANEL
ALT: 21 U/L (ref 0–44)
AST: 27 U/L (ref 15–41)
Albumin: 3.3 g/dL — ABNORMAL LOW (ref 3.5–5.0)
Alkaline Phosphatase: 109 U/L (ref 38–126)
Anion gap: 9 (ref 5–15)
BUN: 15 mg/dL (ref 6–20)
CO2: 21 mmol/L — ABNORMAL LOW (ref 22–32)
Calcium: 8.8 mg/dL — ABNORMAL LOW (ref 8.9–10.3)
Chloride: 108 mmol/L (ref 98–111)
Creatinine, Ser: 0.8 mg/dL (ref 0.44–1.00)
GFR, Estimated: >60 mL/min (ref >60)
Glucose, Bld: 101 mg/dL — ABNORMAL HIGH (ref 70–99)
Potassium: 3.3 mmol/L — ABNORMAL LOW (ref 3.5–5.1)
Sodium: 138 mmol/L (ref 135–145)
Total Bilirubin: 0.2 mg/dL — ABNORMAL LOW (ref 0.3–1.2)
Total Protein: 6 g/dL — ABNORMAL LOW (ref 6.5–8.1)

## 2021-02-21 LAB — CBC
HCT: 37.2 % (ref 36.0–46.0)
Hemoglobin: 11.2 g/dL — ABNORMAL LOW (ref 12.0–15.0)
MCH: 24.3 pg — ABNORMAL LOW (ref 26.0–34.0)
MCHC: 30.1 g/dL (ref 30.0–36.0)
MCV: 80.9 fL (ref 80.0–100.0)
Platelets: 348 10*3/uL (ref 150–400)
RBC: 4.6 MIL/uL (ref 3.87–5.11)
RDW: 15.4 % (ref 11.5–15.5)
WBC: 7 10*3/uL (ref 4.0–10.5)
nRBC: 0 % (ref 0.0–0.2)

## 2021-02-21 LAB — TYPE AND SCREEN
ABO/RH(D): A NEG
Antibody Screen: NEGATIVE

## 2021-02-21 LAB — URINALYSIS, MICROSCOPIC (REFLEX)

## 2021-02-21 LAB — URINALYSIS, ROUTINE W REFLEX MICROSCOPIC
Bilirubin Urine: NEGATIVE
Glucose, UA: NEGATIVE mg/dL
Ketones, ur: NEGATIVE mg/dL
Nitrite: NEGATIVE
Protein, ur: NEGATIVE mg/dL
Specific Gravity, Urine: 1.025 (ref 1.005–1.030)
pH: 6 (ref 5.0–8.0)

## 2021-02-21 LAB — SURGICAL PCR SCREEN
MRSA, PCR: NEGATIVE
Staphylococcus aureus: NEGATIVE

## 2021-02-21 LAB — PROTIME-INR
INR: 0.9 (ref 0.8–1.2)
Prothrombin Time: 12.5 seconds (ref 11.4–15.2)

## 2021-02-21 LAB — SARS CORONAVIRUS 2 (TAT 6-24 HRS): SARS Coronavirus 2: NEGATIVE

## 2021-02-21 LAB — APTT: aPTT: 24 seconds (ref 24–36)

## 2021-02-21 MED ORDER — CIPROFLOXACIN HCL 500 MG PO TABS: 500.0000 mg | ORAL_TABLET | Freq: Two times a day (BID) | ORAL | 0 refills | Status: DC

## 2021-02-21 NOTE — Progress Notes (Signed)
Called Dr. Estanislado Spire office to notify of patient's abnormal U/A with few bacteria.

## 2021-02-21 NOTE — Progress Notes (Signed)
Anesthesia Chart Review:  Case: [id number] Date/Time: 02/22/21 0945   Procedure: LEFT CAROTID-SUBCLAVIAN BYPASS (Left)   Anesthesia type: General   Pre-op diagnosis: Occlusion of Left Subclavian Artery   [facility] / [facility]   Surgeons: Nada Libman, MD       DISCUSSION: Patient is a 60 year old female scheduled for the above procedure.  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  Normal stress test 09/27/20.   Reported last Plavix 02/15/2021 and continuing aspirin perioperatively.  02/21/21 preoperative COVID-19 test in process. Anesthesia team to evaluate on the day of surgery.    VS: BP 130/67    Pulse 88    Temp 36.6 C (Oral)    Resp 17    Ht 5\' 5"  (1.651 m)    Wt 81 kg    LMP  (LMP Unknown)    SpO2 100%    BMI 29.70 kg/m    PROVIDERS: , DO is PCP  Sunnie Nielsen, MD is cardiologist. Evaluation on 09/07/20 for aytpical chest pain with CAD risk factors. Stress test ordered which was normal on 09/27/20. Dr. 11/27/20 and recommended endovascular treatment of her basilar aneurysm was recommended prior to left carotid subclavian bypass.  Brabham, V. Myra Gianotti, MD is vascular surgeon Anner Crete, MD is neurosurgeon   LABS: Labs reviewed: Acceptable for surgery. (all labs ordered are listed, but only abnormal results are displayed)  Labs Reviewed  CBC - Abnormal; Notable for the following components:      Result Value   Hemoglobin 11.2 (*)    MCH 24.3 (*)    All other components within normal limits  COMPREHENSIVE METABOLIC PANEL - Abnormal; Notable for the following components:   Potassium 3.3 (*)    CO2 21 (*)    Glucose, Bld 101 (*)    Calcium 8.8 (*)    Total Protein 6.0 (*)    Albumin 3.3 (*)    Total Bilirubin 0.2  (*)    All other components within normal limits  URINALYSIS, ROUTINE W REFLEX MICROSCOPIC - Abnormal; Notable for the following components:   Hgb urine dipstick TRACE (*)    Leukocytes,Ua TRACE (*)    All other components within normal limits  URINALYSIS, MICROSCOPIC (REFLEX) - Abnormal; Notable for the following components:   Bacteria, UA FEW (*)    All other components within normal limits  SURGICAL PCR SCREEN  SARS CORONAVIRUS 2 (TAT 6-24 HRS)  PROTIME-INR  APTT  TYPE AND SCREEN     IMAGES: CTA Neck 10/04/20:  Impression: - 70% stenosis of the ICA bulb on the right. - 70-75% stenosis of the ICA bulb on the left. 30-50% stenosis of the proximal/origin of the left common carotid artery. - Severe atherosclerotic disease affecting the proximal 2 cm of the left subclavian artery. Definite antegrade flow not established by this study, but there may be a thread of flow adjacent to the dense calcified plaque. Occlusion of the left vertebral artery at its origin with reconstitution in the lower cervical region and patency beyond that to the basilar. Dominant right vertebral artery widely patent. - Question second stenosis of the left subclavian artery at the level of the lung apex. This is not definite, as there is dense venous contrast adjacent to that which could cause artifact. - Unusual and worrisome aneurysm of  the proximal basilar artery, projecting 9 mm anteriorly with a diameter of about 2.5 mm. This would be at high risk of rupture. One could question a localized dissection adjacent to the base of that aneurysm. - Enlarged and heterogeneous thyroid gland, evaluated by ultrasound in February of 2019, and not grossly changed by CT. - S/p stent supported coil embolization of basilar aneurysm 12/02/20.   EKG: 09/07/20: NSR.  Nonspecific T abnormality.     CV: Nuclear stress test 09/27/20:  Nuclear stress EF: 76%. The left ventricular ejection fraction is hyperdynamic (>65%). This is a  low risk study. There is no evidence of ischemia and no evidence of previous infarction. The study is normal.     Carotid US 09/26/20:  - Right Carotid: Velocities in the right ICA are consistent with a 40-59% stenosis. The ECA appears >50% stenosed.  - Left Carotid: Velocities in the left ICA are consistent with a 1-39%  stenosis.  - Vertebrals:  Right vertebral artery demonstrates antegrade flow. Left vertebral artery was not visualized.  - Subclavians: Left subclavian artery was occluded. Normal flow hemodynamics were seen in the right subclavian artery.   Past Medical History:  Diagnosis Date   Anxiety    Basilar artery aneurysm (HCC)    s/p stent supported coil embolization of basilar aneurysm 12/02/20   Dyspnea    Emphysematous COPD (HCC) 04/23/2017   GERD (gastroesophageal reflux disease)    Hyperlipidemia    Hypertension    Peripheral arterial disease (HCC)    Thyroid nodule 04/23/2017    Past Surgical History:  Procedure Laterality Date   ABDOMINAL AORTOGRAM N/A 07/16/2017   Procedure: ABDOMINAL AORTOGRAM;  Surgeon: Nada Libman, MD;  [facility];  Service: Cardiovascular;  Laterality: N/A;   ABDOMINAL AORTOGRAM W/LOWER EXTREMITY Bilateral 12/02/2018   Procedure: ABDOMINAL AORTOGRAM W/LOWER EXTREMITY;  Surgeon: Nada Libman, MD;  [facility];  Service: Cardiovascular;  Laterality: Bilateral;   AORTIC ARCH ANGIOGRAPHY N/A 12/02/2018   Procedure: AORTIC ARCH ANGIOGRAPHY;  Surgeon: Nada Libman, MD;  [facility];  Service: Cardiovascular;  Laterality: N/A;   basilaraneurysm repair  11/2020   ENDOMETRIAL BIOPSY  2005   FEMORAL ARTERY STENT Bilateral 2008   IR 3D INDEPENDENT WKST  12/02/2020   IR ANGIO INTRA EXTRACRAN SEL INTERNAL CAROTID UNI R MOD SED  11/08/2020   IR ANGIO VERTEBRAL SEL SUBCLAVIAN INNOMINATE UNI R MOD SED  11/08/2020   IR ANGIO VERTEBRAL SEL VERTEBRAL BILAT MOD SED  12/02/2020   IR ANGIOGRAM  FOLLOW UP STUDY  12/02/2020   IR ANGIOGRAM FOLLOW UP STUDY  12/02/2020   IR ANGIOGRAM FOLLOW UP STUDY  12/02/2020   IR ANGIOGRAM FOLLOW UP STUDY  12/02/2020   IR TRANSCATH/EMBOLIZ  12/02/2020   IR US GUIDE VASC ACCESS RIGHT  11/08/2020   IR US GUIDE VASC ACCESS RIGHT  12/02/2020   LOWER EXTREMITY ANGIOGRAPHY Bilateral 07/16/2017   Procedure: Lower Extremity Angiography;  Surgeon: Nada Libman, MD;  [facility];  Service: Cardiovascular;  Laterality: Bilateral;   NASAL SINUS SURGERY     PERIPHERAL VASCULAR BALLOON ANGIOPLASTY Bilateral 07/16/2017   Procedure: PERIPHERAL VASCULAR BALLOON ANGIOPLASTY;  Surgeon: Nada Libman, MD;  [facility];  Service: Cardiovascular;  Laterality: Bilateral;  iliacs   PERIPHERAL VASCULAR INTERVENTION Bilateral 12/02/2018   Procedure: PERIPHERAL VASCULAR INTERVENTION;  Surgeon: Nada Libman, MD;  [facility];  Service: Cardiovascular;  Laterality:  Bilateral;   RADIOLOGY WITH ANESTHESIA N/A 12/02/2020   Procedure: Arteriogram, Surpass embolization of basilar aneurysm;  Surgeon: Lisbeth Renshaw, MD;  Location: [facility] OR;  Service: Radiology;  Laterality: N/A;   TONSILLECTOMY     TUBAL LIGATION      MEDICATIONS:  acetaminophen (TYLENOL) 500 MG tablet   albuterol (VENTOLIN HFA) 108 (90 Base) MCG/ACT inhaler   amLODipine (NORVASC) 5 MG tablet   aspirin 325 MG tablet   Aspirin-Salicylamide-Caffeine (BC HEADACHE POWDER PO)   atorvastatin (LIPITOR) 80 MG tablet   Cholecalciferol (VITAMIN D3 PO)   clopidogrel (PLAVIX) 75 MG tablet   losartan (COZAAR) 100 MG tablet   omeprazole (PRILOSEC) 40 MG capsule   promethazine (PHENERGAN) 12.5 MG tablet   senna (SENOKOT) 8.6 MG tablet   traMADol (ULTRAM) 50 MG tablet   vitamin B-12 (CYANOCOBALAMIN) 1000 MCG tablet   No current facility-administered medications for this encounter.    Shonna Chock, PA-C Surgical Short Stay/Anesthesiology [facility] Phone [phone] [facility] Phone [phone] 02/21/2021 12:12 PM

## 2021-02-21 NOTE — Progress Notes (Signed)
PCP - Sunnie Nielsen, DO [facility] Primary Care Cardiologist - Dr. Jens Som  PPM/ICD - n/a Device Orders - n/a Rep Notified - n/a  Chest x-ray - 2019 EKG - 09/07/20 Stress Test - 09/27/20 ECHO - denies Cardiac Cath -   Sleep Study - Many years ago CPAP - No  Diabetes- denies  Blood Thinner Instructions: Patient states she was instructed to stop Plavix on 02/15/21 Aspirin Instructions: Patient states she was instructed to continue taking Asprin through day of surgery.   ERAS Protcol - No PRE-SURGERY Ensure or G2- n/a  COVID TEST- 02/21/21. Pending.    Anesthesia review: Yes. Message sent to Jaynie Collins, PA.   Patient denies shortness of breath, fever, cough and chest pain at PAT appointment   All instructions explained to the patient, with a verbal understanding of the material. Patient agrees to go over the instructions while at home for a better understanding. Patient also instructed to self quarantine after being tested for COVID-19. The opportunity to ask questions was provided.

## 2021-02-21 NOTE — Anesthesia Preprocedure Evaluation (Addendum)
Anesthesia Evaluation  Patient identified by MRN, date of birth, ID band Patient awake    Reviewed: Allergy & Precautions, NPO status , Patient's Chart, lab work & pertinent test results  Airway Mallampati: III  TM Distance: >3 FB Neck ROM: Full    Dental  (+) Teeth Intact, Dental Advisory Given   Pulmonary shortness of breath, COPD,  COPD inhaler, former smoker,  Quit smoking 2019, 20 pack year history   Pulmonary exam normal breath sounds clear to auscultation       Cardiovascular hypertension, Pt. on medications + Peripheral Vascular Disease (plavix)  Normal cardiovascular exam Rhythm:Regular Rate:Normal   CV: Nuclear stress test8/2/22: ? Nuclear stress EF: 76%. The left ventricular ejection fraction is hyperdynamic (>65%). ? This is a low risk study. There is no evidence of ischemia and no evidence of previous infarction. ? The study is normal.   Neuro/Psych PSYCHIATRIC DISORDERS Anxiety basilar aneurysm (s/p stent supported coil embolization of basilar aneurysm 12/02/20)  Carotid US 09/26/20:  -Right Carotid: Velocities in the right ICA are consistent with a 40-59% stenosis. The ECA appears >50% stenosed. -Left Carotid: Velocities in the left ICA are consistent with a 1-39%  stenosis.  -Vertebrals: Right vertebral artery demonstrates antegrade flow. Left vertebral artery was not visualized. -Subclavians: Left subclavian artery was occluded. Normal flow hemodynamics were seen in the right subclavian artery.    GI/Hepatic Neg liver ROS, GERD  Controlled,  Endo/Other  negative endocrine ROS  Renal/GU negative Renal ROS  negative genitourinary   Musculoskeletal negative musculoskeletal ROS (+)   Abdominal   Peds  Hematology negative hematology ROS (+) hct 37.2   Anesthesia Other Findings L subclavian artery occlusion   Reproductive/Obstetrics negative OB ROS                            Anesthesia Physical Anesthesia Plan  ASA: 3  Anesthesia Plan: General   Post-op Pain Management: Tylenol PO (pre-op)   Induction: Intravenous  PONV Risk Score and Plan: 3 and Ondansetron, Dexamethasone, Midazolam and Treatment may vary due to age or medical condition  Airway Management Planned: Oral ETT  Additional Equipment: Arterial line  Intra-op Plan:   Post-operative Plan: Extubation in OR  Informed Consent: I have reviewed the patients History and Physical, chart, labs and discussed the procedure including the risks, benefits and alternatives for the proposed anesthesia with the patient or authorized representative who has indicated his/her understanding and acceptance.     Dental advisory given  Plan Discussed with: CRNA  Anesthesia Plan Comments: (Arterial line, 2 large bore PIVs vs central line )     Anesthesia Quick Evaluation

## 2021-02-22 ENCOUNTER — Encounter (HOSPITAL_COMMUNITY)
Admission: RE | Disposition: A | Discharge status: Home / Self Care | Payer: Self-pay | Source: Home / Self Care | Attending: Surgery

## 2021-02-22 ENCOUNTER — Inpatient Hospital Stay (HOSPITAL_COMMUNITY): Payer: Federal, State, Local not specified - PPO | Admitting: Vascular Surgery

## 2021-02-22 ENCOUNTER — Inpatient Hospital Stay (HOSPITAL_COMMUNITY)
Admission: RE | Admit: 2021-02-22 | Discharge: 2021-02-25 | DRG: 037 | Disposition: A | Discharge status: Home / Self Care | Payer: Federal, State, Local not specified - PPO | Attending: Surgery | Admitting: Surgery

## 2021-02-22 ENCOUNTER — Encounter (HOSPITAL_COMMUNITY): Payer: Self-pay | Admitting: Surgery

## 2021-02-22 ENCOUNTER — Inpatient Hospital Stay (HOSPITAL_COMMUNITY): Payer: Federal, State, Local not specified - PPO | Admitting: Anesthesiology

## 2021-02-22 DIAGNOSIS — Z20822 Contact with and (suspected) exposure to covid-19: Secondary | ICD-10-CM | POA: Diagnosis present

## 2021-02-22 DIAGNOSIS — G458 Other transient cerebral ischemic attacks and related syndromes: Secondary | ICD-10-CM

## 2021-02-22 DIAGNOSIS — Z8249 Family history of ischemic heart disease and other diseases of the circulatory system: Secondary | ICD-10-CM | POA: Diagnosis not present

## 2021-02-22 DIAGNOSIS — I708 Atherosclerosis of other arteries: Secondary | ICD-10-CM | POA: Diagnosis present

## 2021-02-22 DIAGNOSIS — G450 Vertebro-basilar artery syndrome: Principal | ICD-10-CM | POA: Diagnosis present

## 2021-02-22 DIAGNOSIS — I10 Essential (primary) hypertension: Secondary | ICD-10-CM | POA: Diagnosis not present

## 2021-02-22 DIAGNOSIS — K219 Gastro-esophageal reflux disease without esophagitis: Secondary | ICD-10-CM | POA: Diagnosis present

## 2021-02-22 DIAGNOSIS — E785 Hyperlipidemia, unspecified: Secondary | ICD-10-CM | POA: Diagnosis present

## 2021-02-22 DIAGNOSIS — Z803 Family history of malignant neoplasm of breast: Secondary | ICD-10-CM

## 2021-02-22 DIAGNOSIS — J449 Chronic obstructive pulmonary disease, unspecified: Secondary | ICD-10-CM | POA: Diagnosis not present

## 2021-02-22 DIAGNOSIS — J810 Acute pulmonary edema: Secondary | ICD-10-CM | POA: Diagnosis not present

## 2021-02-22 DIAGNOSIS — Z881 Allergy status to other antibiotic agents status: Secondary | ICD-10-CM | POA: Diagnosis not present

## 2021-02-22 DIAGNOSIS — R52 Pain, unspecified: Secondary | ICD-10-CM

## 2021-02-22 DIAGNOSIS — Z7982 Long term (current) use of aspirin: Secondary | ICD-10-CM | POA: Diagnosis not present

## 2021-02-22 DIAGNOSIS — Z95820 Peripheral vascular angioplasty status with implants and grafts: Secondary | ICD-10-CM

## 2021-02-22 DIAGNOSIS — I6522 Occlusion and stenosis of left carotid artery: Secondary | ICD-10-CM | POA: Diagnosis not present

## 2021-02-22 DIAGNOSIS — Z79899 Other long term (current) drug therapy: Secondary | ICD-10-CM

## 2021-02-22 DIAGNOSIS — I70208 Unspecified atherosclerosis of native arteries of extremities, other extremity: Secondary | ICD-10-CM | POA: Diagnosis not present

## 2021-02-22 DIAGNOSIS — G8324 Monoplegia of upper limb affecting left nondominant side: Secondary | ICD-10-CM | POA: Diagnosis present

## 2021-02-22 DIAGNOSIS — Z7902 Long term (current) use of antithrombotics/antiplatelets: Secondary | ICD-10-CM

## 2021-02-22 DIAGNOSIS — J439 Emphysema, unspecified: Secondary | ICD-10-CM | POA: Diagnosis present

## 2021-02-22 DIAGNOSIS — Z87891 Personal history of nicotine dependence: Secondary | ICD-10-CM | POA: Diagnosis not present

## 2021-02-22 DIAGNOSIS — R079 Chest pain, unspecified: Secondary | ICD-10-CM | POA: Diagnosis not present

## 2021-02-22 DIAGNOSIS — E876 Hypokalemia: Secondary | ICD-10-CM | POA: Diagnosis not present

## 2021-02-22 DIAGNOSIS — Z823 Family history of stroke: Secondary | ICD-10-CM | POA: Diagnosis not present

## 2021-02-22 DIAGNOSIS — Z9889 Other specified postprocedural states: Secondary | ICD-10-CM

## 2021-02-22 HISTORY — PX: CAROTID-SUBCLAVIAN BYPASS GRAFT: SHX910

## 2021-02-22 LAB — POCT ACTIVATED CLOTTING TIME
Activated Clotting Time: 233 seconds
Activated Clotting Time: 239 seconds

## 2021-02-22 SURGERY — CREATION, BYPASS, ARTERIAL, SUBCLAVIAN TO CAROTID, USING GRAFT
Anesthesia: General | Laterality: Left

## 2021-02-22 MED ORDER — HEMOSTATIC AGENTS (NO CHARGE) OPTIME
TOPICAL | Status: DC | PRN
  Administered 2021-02-22: 1 via TOPICAL

## 2021-02-22 MED ORDER — ONDANSETRON HCL 4 MG/2ML IJ SOLN
INTRAMUSCULAR | Status: AC
  Filled 2021-02-22: qty 2

## 2021-02-22 MED ORDER — MIDAZOLAM HCL 2 MG/2ML IJ SOLN
INTRAMUSCULAR | Status: AC
  Filled 2021-02-22: qty 2

## 2021-02-22 MED ORDER — ONDANSETRON HCL 4 MG/2ML IJ SOLN
INTRAMUSCULAR | Status: DC | PRN
  Administered 2021-02-22: 4 mg via INTRAVENOUS

## 2021-02-22 MED ORDER — ACETAMINOPHEN 650 MG RE SUPP: 325.0000 mg | RECTAL | Status: DC | PRN

## 2021-02-22 MED ORDER — ALBUTEROL SULFATE (2.5 MG/3ML) 0.083% IN NEBU: 2.5000 mg | INHALATION_SOLUTION | RESPIRATORY_TRACT | Status: DC | PRN

## 2021-02-22 MED ORDER — PANTOPRAZOLE SODIUM 40 MG PO TBEC
80.0000 mg | DELAYED_RELEASE_TABLET | Freq: Every day | ORAL | Status: DC
  Administered 2021-02-23 – 2021-02-25 (×3): 80 mg via ORAL
  Filled 2021-02-22 (×3): qty 2

## 2021-02-22 MED ORDER — PROPOFOL 10 MG/ML IV BOLUS
INTRAVENOUS | Status: AC
  Filled 2021-02-22: qty 20

## 2021-02-22 MED ORDER — MAGNESIUM SULFATE 2 GM/50ML IV SOLN: 2.0000 g | Freq: Every day | INTRAVENOUS | Status: DC | PRN

## 2021-02-22 MED ORDER — POTASSIUM CHLORIDE CRYS ER 20 MEQ PO TBCR: 20.0000 meq | EXTENDED_RELEASE_TABLET | Freq: Every day | ORAL | Status: DC | PRN

## 2021-02-22 MED ORDER — ACETAMINOPHEN 500 MG PO TABS
1000.0000 mg | ORAL_TABLET | Freq: Once | ORAL | Status: AC
  Administered 2021-02-22: 08:00:00 1000 mg via ORAL
  Filled 2021-02-22: qty 2

## 2021-02-22 MED ORDER — ALUM & MAG HYDROXIDE-SIMETH 200-200-20 MG/5ML PO SUSP: 15.0000 mL | ORAL | Status: DC | PRN

## 2021-02-22 MED ORDER — OXYCODONE-ACETAMINOPHEN 5-325 MG PO TABS
1.0000 | ORAL_TABLET | ORAL | Status: DC | PRN
  Administered 2021-02-22 – 2021-02-23 (×2): 2 via ORAL
  Filled 2021-02-22 (×2): qty 2

## 2021-02-22 MED ORDER — SODIUM CHLORIDE 0.9 % IV SOLN: INTRAVENOUS | Status: DC

## 2021-02-22 MED ORDER — DOCUSATE SODIUM 100 MG PO CAPS
100.0000 mg | ORAL_CAPSULE | Freq: Every day | ORAL | Status: DC
  Administered 2021-02-23 – 2021-02-24 (×2): 100 mg via ORAL
  Filled 2021-02-22 (×3): qty 1

## 2021-02-22 MED ORDER — LACTATED RINGERS IV SOLN: INTRAVENOUS | Status: DC | PRN

## 2021-02-22 MED ORDER — HYDRALAZINE HCL 20 MG/ML IJ SOLN: 5.0000 mg | INTRAMUSCULAR | Status: DC | PRN

## 2021-02-22 MED ORDER — CHLORHEXIDINE GLUCONATE CLOTH 2 % EX PADS: 6.0000 | MEDICATED_PAD | Freq: Once | CUTANEOUS | Status: DC

## 2021-02-22 MED ORDER — CHLORHEXIDINE GLUCONATE 0.12 % MT SOLN
15.0000 mL | OROMUCOSAL | Status: AC
  Administered 2021-02-22: 08:00:00 15 mL via OROMUCOSAL
  Filled 2021-02-22 (×2): qty 15

## 2021-02-22 MED ORDER — PROTAMINE SULFATE 10 MG/ML IV SOLN
INTRAVENOUS | Status: DC | PRN
  Administered 2021-02-22: 50 mg via INTRAVENOUS

## 2021-02-22 MED ORDER — PROTAMINE SULFATE 10 MG/ML IV SOLN
INTRAVENOUS | Status: AC
  Filled 2021-02-22: qty 5

## 2021-02-22 MED ORDER — PHENYLEPHRINE HCL (PRESSORS) 10 MG/ML IV SOLN
INTRAVENOUS | Status: DC | PRN
  Administered 2021-02-22: 40 ug via INTRAVENOUS
  Administered 2021-02-22: 80 ug via INTRAVENOUS

## 2021-02-22 MED ORDER — DEXAMETHASONE SODIUM PHOSPHATE 10 MG/ML IJ SOLN
INTRAMUSCULAR | Status: DC | PRN
  Administered 2021-02-22: 10 mg via INTRAVENOUS

## 2021-02-22 MED ORDER — LIDOCAINE HCL (PF) 1 % IJ SOLN
INTRAMUSCULAR | Status: AC
  Filled 2021-02-22: qty 30

## 2021-02-22 MED ORDER — LIDOCAINE 2% (20 MG/ML) 5 ML SYRINGE
INTRAMUSCULAR | Status: AC
  Filled 2021-02-22: qty 5

## 2021-02-22 MED ORDER — HYDROMORPHONE HCL 1 MG/ML IJ SOLN
INTRAMUSCULAR | Status: AC
  Filled 2021-02-22: qty 1

## 2021-02-22 MED ORDER — CEFAZOLIN SODIUM-DEXTROSE 2-4 GM/100ML-% IV SOLN
2.0000 g | Freq: Three times a day (TID) | INTRAVENOUS | Status: AC
  Administered 2021-02-22 – 2021-02-23 (×2): 2 g via INTRAVENOUS
  Filled 2021-02-22 (×2): qty 100

## 2021-02-22 MED ORDER — SUGAMMADEX SODIUM 200 MG/2ML IV SOLN
INTRAVENOUS | Status: DC | PRN
  Administered 2021-02-22: 200 mg via INTRAVENOUS

## 2021-02-22 MED ORDER — GUAIFENESIN-DM 100-10 MG/5ML PO SYRP
15.0000 mL | ORAL_SOLUTION | ORAL | Status: DC | PRN
  Administered 2021-02-25: 15 mL via ORAL
  Filled 2021-02-22: qty 15

## 2021-02-22 MED ORDER — 0.9 % SODIUM CHLORIDE (POUR BTL) OPTIME
TOPICAL | Status: DC | PRN
  Administered 2021-02-22: 09:00:00 2000 mL

## 2021-02-22 MED ORDER — CIPROFLOXACIN HCL 500 MG PO TABS
500.0000 mg | ORAL_TABLET | Freq: Two times a day (BID) | ORAL | Status: DC
  Administered 2021-02-22 – 2021-02-25 (×6): 500 mg via ORAL
  Filled 2021-02-22 (×6): qty 1

## 2021-02-22 MED ORDER — HYDROMORPHONE HCL 1 MG/ML IJ SOLN
0.5000 mg | INTRAMUSCULAR | Status: DC | PRN
  Administered 2021-02-22: 16:00:00 0.5 mg via INTRAVENOUS
  Administered 2021-02-22 – 2021-02-23 (×2): 1 mg via INTRAVENOUS
  Filled 2021-02-22 (×3): qty 1

## 2021-02-22 MED ORDER — AMISULPRIDE (ANTIEMETIC) 5 MG/2ML IV SOLN
INTRAVENOUS | Status: AC
  Filled 2021-02-22: qty 2

## 2021-02-22 MED ORDER — FENTANYL CITRATE (PF) 250 MCG/5ML IJ SOLN
INTRAMUSCULAR | Status: AC
  Filled 2021-02-22: qty 5

## 2021-02-22 MED ORDER — PROPOFOL 10 MG/ML IV BOLUS
INTRAVENOUS | Status: DC | PRN
  Administered 2021-02-22: 130 mg via INTRAVENOUS

## 2021-02-22 MED ORDER — ATORVASTATIN CALCIUM 80 MG PO TABS
80.0000 mg | ORAL_TABLET | Freq: Every day | ORAL | Status: DC
  Administered 2021-02-22 – 2021-02-24 (×3): 80 mg via ORAL
  Filled 2021-02-22 (×3): qty 1

## 2021-02-22 MED ORDER — LIDOCAINE HCL (CARDIAC) PF 100 MG/5ML IV SOSY
PREFILLED_SYRINGE | INTRAVENOUS | Status: DC | PRN
  Administered 2021-02-22: 60 mg via INTRATRACHEAL

## 2021-02-22 MED ORDER — PHENOL 1.4 % MT LIQD: 1.0000 | OROMUCOSAL | Status: DC | PRN

## 2021-02-22 MED ORDER — HEPARIN SODIUM (PORCINE) 1000 UNIT/ML IJ SOLN
INTRAMUSCULAR | Status: AC
  Filled 2021-02-22: qty 10

## 2021-02-22 MED ORDER — ONDANSETRON HCL 4 MG/2ML IJ SOLN
4.0000 mg | Freq: Four times a day (QID) | INTRAMUSCULAR | Status: DC | PRN
  Administered 2021-02-22 – 2021-02-25 (×2): 4 mg via INTRAVENOUS
  Filled 2021-02-22 (×2): qty 2

## 2021-02-22 MED ORDER — PROMETHAZINE HCL 25 MG/ML IJ SOLN: 6.2500 mg | INTRAMUSCULAR | Status: DC | PRN

## 2021-02-22 MED ORDER — PHENYLEPHRINE HCL-NACL 20-0.9 MG/250ML-% IV SOLN
INTRAVENOUS | Status: DC | PRN
  Administered 2021-02-22: 50 ug/min via INTRAVENOUS

## 2021-02-22 MED ORDER — MIDAZOLAM HCL 2 MG/2ML IJ SOLN
INTRAMUSCULAR | Status: DC | PRN
  Administered 2021-02-22: 2 mg via INTRAVENOUS

## 2021-02-22 MED ORDER — LOSARTAN POTASSIUM 50 MG PO TABS
100.0000 mg | ORAL_TABLET | Freq: Every day | ORAL | Status: DC
  Administered 2021-02-22 – 2021-02-24 (×3): 100 mg via ORAL
  Filled 2021-02-22 (×3): qty 2

## 2021-02-22 MED ORDER — HEPARIN SODIUM (PORCINE) 5000 UNIT/ML IJ SOLN
5000.0000 [IU] | Freq: Three times a day (TID) | INTRAMUSCULAR | Status: DC
  Administered 2021-02-23 – 2021-02-25 (×6): 5000 [IU] via SUBCUTANEOUS
  Filled 2021-02-22 (×6): qty 1

## 2021-02-22 MED ORDER — ASPIRIN 325 MG PO TABS
325.0000 mg | ORAL_TABLET | Freq: Every day | ORAL | Status: DC
  Administered 2021-02-23 – 2021-02-25 (×3): 325 mg via ORAL
  Filled 2021-02-22 (×3): qty 1

## 2021-02-22 MED ORDER — ROCURONIUM BROMIDE 10 MG/ML (PF) SYRINGE
PREFILLED_SYRINGE | INTRAVENOUS | Status: AC
  Filled 2021-02-22: qty 20

## 2021-02-22 MED ORDER — CEFAZOLIN SODIUM-DEXTROSE 2-4 GM/100ML-% IV SOLN
2.0000 g | INTRAVENOUS | Status: AC
  Administered 2021-02-22: 10:00:00 2 g via INTRAVENOUS
  Filled 2021-02-22: qty 100

## 2021-02-22 MED ORDER — DEXAMETHASONE SODIUM PHOSPHATE 10 MG/ML IJ SOLN
INTRAMUSCULAR | Status: AC
  Filled 2021-02-22: qty 1

## 2021-02-22 MED ORDER — FENTANYL CITRATE (PF) 250 MCG/5ML IJ SOLN
INTRAMUSCULAR | Status: DC | PRN
  Administered 2021-02-22 (×3): 50 ug via INTRAVENOUS
  Administered 2021-02-22: 100 ug via INTRAVENOUS

## 2021-02-22 MED ORDER — OXYCODONE HCL 5 MG/5ML PO SOLN: 5.0000 mg | Freq: Once | ORAL | Status: DC | PRN

## 2021-02-22 MED ORDER — SENNOSIDES-DOCUSATE SODIUM 8.6-50 MG PO TABS
1.0000 | ORAL_TABLET | Freq: Every evening | ORAL | Status: DC | PRN
  Administered 2021-02-23: 18:00:00 1 via ORAL
  Filled 2021-02-22: qty 1

## 2021-02-22 MED ORDER — AMISULPRIDE (ANTIEMETIC) 5 MG/2ML IV SOLN
10.0000 mg | Freq: Once | INTRAVENOUS | Status: AC | PRN
  Administered 2021-02-22: 13:00:00 10 mg via INTRAVENOUS

## 2021-02-22 MED ORDER — AMLODIPINE BESYLATE 5 MG PO TABS
5.0000 mg | ORAL_TABLET | Freq: Every day | ORAL | Status: DC
  Administered 2021-02-22 – 2021-02-24 (×3): 5 mg via ORAL
  Filled 2021-02-22 (×3): qty 1

## 2021-02-22 MED ORDER — HEPARIN 6000 UNIT IRRIGATION SOLUTION
Status: DC | PRN
  Administered 2021-02-22: 1

## 2021-02-22 MED ORDER — ACETAMINOPHEN 325 MG PO TABS
325.0000 mg | ORAL_TABLET | ORAL | Status: DC | PRN
  Administered 2021-02-22 – 2021-02-25 (×11): 650 mg via ORAL
  Filled 2021-02-22 (×11): qty 2

## 2021-02-22 MED ORDER — ROCURONIUM 10MG/ML (10ML) SYRINGE FOR MEDFUSION PUMP - OPTIME
INTRAVENOUS | Status: DC | PRN
  Administered 2021-02-22: 100 mg via INTRAVENOUS
  Administered 2021-02-22: 50 mg via INTRAVENOUS

## 2021-02-22 MED ORDER — HEPARIN SODIUM (PORCINE) 1000 UNIT/ML IJ SOLN
INTRAMUSCULAR | Status: DC | PRN
  Administered 2021-02-22: 8000 [IU] via INTRAVENOUS
  Administered 2021-02-22: 2000 [IU] via INTRAVENOUS

## 2021-02-22 MED ORDER — HEPARIN 6000 UNIT IRRIGATION SOLUTION
Status: AC
  Filled 2021-02-22: qty 500

## 2021-02-22 MED ORDER — LACTATED RINGERS IV SOLN: INTRAVENOUS | Status: DC

## 2021-02-22 MED ORDER — HYDROMORPHONE HCL 1 MG/ML IJ SOLN
0.2500 mg | INTRAMUSCULAR | Status: DC | PRN
  Administered 2021-02-22 (×2): 0.25 mg via INTRAVENOUS

## 2021-02-22 MED ORDER — SODIUM CHLORIDE 0.9 % IV SOLN
500.0000 mL | Freq: Once | INTRAVENOUS | Status: AC | PRN
  Administered 2021-02-22: 16:00:00 500 mL via INTRAVENOUS

## 2021-02-22 MED ORDER — LABETALOL HCL 5 MG/ML IV SOLN: 10.0000 mg | INTRAVENOUS | Status: DC | PRN

## 2021-02-22 MED ORDER — METOPROLOL TARTRATE 5 MG/5ML IV SOLN: 2.0000 mg | INTRAVENOUS | Status: DC | PRN

## 2021-02-22 MED ORDER — ALBUTEROL SULFATE HFA 108 (90 BASE) MCG/ACT IN AERS
1.0000 | INHALATION_SPRAY | RESPIRATORY_TRACT | Status: DC | PRN
Start: 2021-02-22 — End: 2021-02-22

## 2021-02-22 MED ORDER — PHENYLEPHRINE 40 MCG/ML (10ML) SYRINGE FOR IV PUSH (FOR BLOOD PRESSURE SUPPORT)
PREFILLED_SYRINGE | INTRAVENOUS | Status: AC
  Filled 2021-02-22: qty 10

## 2021-02-22 MED ORDER — OXYCODONE HCL 5 MG PO TABS: 5.0000 mg | ORAL_TABLET | Freq: Once | ORAL | Status: DC | PRN

## 2021-02-22 SURGICAL SUPPLY — 45 items
BAG COUNTER SPONGE SURGICOUNT (BAG) ×2 IMPLANT
BAG DECANTER FOR FLEXI CONT (MISCELLANEOUS) ×1 IMPLANT
CANISTER SUCT 3000ML PPV (MISCELLANEOUS) ×2 IMPLANT
CLIP VESOCCLUDE MED 24/CT (CLIP) ×2 IMPLANT
CLIP VESOCCLUDE SM WIDE 24/CT (CLIP) ×2 IMPLANT
DERMABOND ADVANCED (GAUZE/BANDAGES/DRESSINGS) ×1
DERMABOND ADVANCED .7 DNX12 (GAUZE/BANDAGES/DRESSINGS) ×1 IMPLANT
DRAIN CHANNEL 15F RND FF W/TCR (WOUND CARE) ×1 IMPLANT
DRAPE INCISE IOBAN 66X45 STRL (DRAPES) ×1 IMPLANT
ELECT REM PT RETURN 9FT ADLT (ELECTROSURGICAL) ×2
ELECTRODE REM PT RTRN 9FT ADLT (ELECTROSURGICAL) ×1 IMPLANT
EVACUATOR SILICONE 100CC (DRAIN) ×1 IMPLANT
GAUZE 4X4 16PLY ~~LOC~~+RFID DBL (SPONGE) ×1 IMPLANT
GAUZE SPONGE 4X4 12PLY STRL (GAUZE/BANDAGES/DRESSINGS) ×2 IMPLANT
GLOVE SRG 8 PF TXTR STRL LF DI (GLOVE) ×1 IMPLANT
GLOVE SURG POLYISO LF SZ7.5 (GLOVE) ×2 IMPLANT
GLOVE SURG UNDER POLY LF SZ8 (GLOVE) ×1
GOWN STRL REUS W/ TWL LRG LVL3 (GOWN DISPOSABLE) ×2 IMPLANT
GOWN STRL REUS W/ TWL XL LVL3 (GOWN DISPOSABLE) ×1 IMPLANT
GOWN STRL REUS W/TWL LRG LVL3 (GOWN DISPOSABLE) ×2
GOWN STRL REUS W/TWL XL LVL3 (GOWN DISPOSABLE) ×1
GRAFT CV 30X6KNTD STRG TUBE (Vascular Products) IMPLANT
GRAFT HEMASHIELD 6MM (Vascular Products) ×1 IMPLANT
HEMOSTAT SNOW SURGICEL 2X4 (HEMOSTASIS) IMPLANT
KIT BASIN OR (CUSTOM PROCEDURE TRAY) ×2 IMPLANT
KIT DRAIN CSF ACCUDRAIN (MISCELLANEOUS) IMPLANT
KIT TURNOVER KIT B (KITS) ×2 IMPLANT
NS IRRIG 1000ML POUR BTL (IV SOLUTION) ×4 IMPLANT
PACK CAROTID (CUSTOM PROCEDURE TRAY) ×2 IMPLANT
PAD ARMBOARD 7.5X6 YLW CONV (MISCELLANEOUS) ×4 IMPLANT
POSITIONER HEAD DONUT 9IN (MISCELLANEOUS) ×2 IMPLANT
PUNCH AORTIC ROTATE 5MM 8IN (MISCELLANEOUS) ×1 IMPLANT
SPONGE T-LAP 18X18 ~~LOC~~+RFID (SPONGE) ×1 IMPLANT
SURGIFLO W/THROMBIN 8M KIT (HEMOSTASIS) ×1 IMPLANT
SUT ETHILON 3 0 PS 1 (SUTURE) ×1 IMPLANT
SUT PROLENE 5 0 C 1 24 (SUTURE) ×2 IMPLANT
SUT PROLENE 6 0 BV (SUTURE) ×6 IMPLANT
SUT SILK 3 0 (SUTURE) ×1
SUT SILK 3-0 18XBRD TIE 12 (SUTURE) IMPLANT
SUT VIC AB 3-0 SH 27 (SUTURE) ×2
SUT VIC AB 3-0 SH 27X BRD (SUTURE) ×2 IMPLANT
SUT VIC AB 3-0 X1 27 (SUTURE) ×2 IMPLANT
TOWEL GREEN STERILE (TOWEL DISPOSABLE) ×2 IMPLANT
TRAY FOLEY MTR SLVR 16FR STAT (SET/KITS/TRAYS/PACK) ×2 IMPLANT
WATER STERILE IRR 1000ML POUR (IV SOLUTION) ×2 IMPLANT

## 2021-02-22 NOTE — Progress Notes (Signed)
°  Progress Note    02/22/2021 4:23 PM Day of Surgery  Subjective:  left neck sore   Vitals:   02/22/21 1432 02/22/21 1503  BP: (!) 141/67 138/70  Pulse: 88 90  Resp: 13 15  Temp: 97.9 F (36.6 C) 98.5 F (36.9 C)  SpO2: 95% 94%   Physical Exam: Cardiac:  regular Lungs:  non labored Incisions:  left neck incision is clean, dry and intact Extremities: well perfused and warm. Palpable left brachial and radial pulse Neurologic: alert and oriented  CBC    Component Value Date/Time   WBC 7.0 02/21/2021 0904   RBC 4.60 02/21/2021 0904   HGB 11.2 (L) 02/21/2021 0904   HCT 37.2 02/21/2021 0904   PLT 348 02/21/2021 0904   MCV 80.9 02/21/2021 0904   MCH 24.3 (L) 02/21/2021 0904   MCHC 30.1 02/21/2021 0904   RDW 15.4 02/21/2021 0904   LYMPHSABS 1.9 11/08/2020 0848   MONOABS 0.5 11/08/2020 0848   EOSABS 0.3 11/08/2020 0848   BASOSABS 0.1 11/08/2020 0848    BMET    Component Value Date/Time   NA 138 02/21/2021 0904   K 3.3 (L) 02/21/2021 0904   CL 108 02/21/2021 0904   CO2 21 (L) 02/21/2021 0904   GLUCOSE 101 (H) 02/21/2021 0904   BUN 15 02/21/2021 0904   CREATININE 0.80 02/21/2021 0904   CREATININE 0.86 09/02/2020 0000   CALCIUM 8.8 (L) 02/21/2021 0904   GFRNONAA >60 02/21/2021 0904   GFRNONAA 74 09/02/2020 0000   GFRAA 86 09/02/2020 0000    INR    Component Value Date/Time   INR 0.9 02/21/2021 0904     Intake/Output Summary (Last 24 hours) at 02/22/2021 1623 Last data filed at 02/22/2021 1435 Gross per 24 hour  Intake 1100 ml  Output 145 ml  Net 955 ml     Assessment/Plan:  60 y.o. female is s/p left common carotid to subclavian bypass  Day of Surgery    Doing well post op Incisional pain present Incision well appearing without swelling or hematoma Neurologically  intact Left upper extremity well perfused and warm with palpable pulses Hemodynamically stable Anticipate discharge tomorrow if she does well overnight   Graceann Congress,  PA-C [facility] [phone] 02/22/2021 4:23 PM

## 2021-02-22 NOTE — Transfer of Care (Signed)
Immediate Anesthesia Transfer of Care Note  Patient: Tina Navarro  Procedure(s) Performed: LEFT CAROTID-SUBCLAVIAN BYPASS USING HEAMSHIELD GOLD X30MM (Left)  Patient Location: PACU  Anesthesia Type:General  Level of Consciousness: drowsy, patient cooperative and responds to stimulation  Airway & Oxygen Therapy: Patient Spontanous Breathing and Patient connected to nasal cannula oxygen  Post-op Assessment: Report given to RN, Post -op Vital signs reviewed and stable and Patient moving all extremities X 4  Post vital signs: Reviewed and stable  Last Vitals:  Vitals Value Taken Time  BP 130/66 02/22/21 1302  Temp    Pulse 88 02/22/21 1305  Resp 11 02/22/21 1305  SpO2 97 % 02/22/21 1305  Vitals shown include unvalidated device data.  Last Pain:  Vitals:   02/22/21 0808  TempSrc:   PainSc: 3          Complications: No notable events documented.

## 2021-02-22 NOTE — Progress Notes (Signed)
Pt arrived to 4E from PACU. CHG bath done. Tele applied and CCMD called. VSS. Pt oriented to room and call light in reach.  Brooke Pace, RN

## 2021-02-22 NOTE — H&P (Signed)
Vascular and Vein Specialist of    Patient name: Tina Navarro         MRN: [id number]        DOB: April 04, 1960          Sex: female     REASON FOR VISIT:      Follow up   HISOTRY OF PRESENT ILLNESS:    Jareli Highland is a 60 y.o. female who has a history of percutaneous intervention to her legs in 2007.  She transferred to the [facility] health system.  I performed angiography on 07/16/2017 and performed angioplasty of bilateral common iliac stenosis.  She had recurrent symptoms shortly thereafter and on 12/02/2018 I placed bilateral common iliac VBX stents (8 x 39).  She has had issues with left arm weakness while washing her hair.  She has a known left subclavian stenosis which was confirmed with angiography.  Her symptoms have been tolerable however they have progressed.  I sent her for CT scan to better evaluate this for surgical reconstruction.  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  She is still having issues with left arm weakness and dizziness     PAST MEDICAL HISTORY:        Past Medical History:  Diagnosis Date   Emphysematous COPD (HCC) 04/23/2017   GERD (gastroesophageal reflux disease)     Hyperlipidemia     Hypertension     Peripheral arterial disease (HCC)     Thyroid nodule 04/23/2017        FAMILY HISTORY:         Family History  Problem Relation Age of Onset   Hypertension Mother     Hypertension Father     Heart disease Father     Peripheral vascular disease Father     Alcohol abuse Father     Fibroids Sister     Cancer Maternal Grandmother          BREAST   Stroke Maternal Grandmother          CEBERAL ATHEROSCLEROSIS   Cancer - Ovarian Cousin        SOCIAL HISTORY:    Social History         Tobacco Use   Smoking status: Former      Packs/day: 0.50      Years: 35.00      Pack years: 17.50      Types: Cigarettes      Quit date: 12/18/2017      Years since quitting:  3.0   Smokeless tobacco: Never   Tobacco comments:      As per pt - smoking 1/2 pk daily  Substance Use Topics   Alcohol use: No        ALLERGIES:         Allergies  Allergen Reactions   Doxycycline Hives, Rash and Other (See Comments)      Pain, photosensitive        CURRENT MEDICATIONS:          Current Outpatient Medications  Medication Sig Dispense Refill   acetaminophen (TYLENOL) 500 MG tablet Take 1,000 mg by mouth every 6 (six) hours as needed for headache.       albuterol (VENTOLIN HFA) 108 (90 Base) MCG/ACT inhaler Inhale 1-2 puffs into the lungs every 4 (four) hours as needed for wheezing or shortness of breath. 1 each 2   aspirin 325 MG tablet Take 325 mg by mouth daily.  Aspirin-Salicylamide-Caffeine (BC HEADACHE POWDER PO) Take 1 packet by mouth daily as needed (for headache).        atorvastatin (LIPITOR) 80 MG tablet Take 1 tablet (80 mg total) by mouth at bedtime. 90 tablet 3   Cholecalciferol (VITAMIN D3 PO) Take 1 tablet by mouth in the morning.       clopidogrel (PLAVIX) 75 MG tablet Take 1 tablet (75 mg total) by mouth daily. 90 tablet 1   losartan (COZAAR) 100 MG tablet Take 1 tablet (100 mg total) by mouth daily. 90 tablet 3   omeprazole (PRILOSEC) 40 MG capsule Take 1 capsule (40 mg total) by mouth daily. 90 capsule 3   promethazine (PHENERGAN) 12.5 MG tablet Take 1 tablet (12.5 mg total) by mouth every 4 (four) hours as needed for nausea or vomiting. 30 tablet 1   senna (SENOKOT) 8.6 MG tablet Take 2 tablets (17.2 mg total) by mouth daily. As needed for constipation (Patient taking differently: Take 2 tablets by mouth at bedtime.) 90 tablet 1   traMADol (ULTRAM) 50 MG tablet Take 1 tablet (50 mg total) by mouth every 6 (six) hours as needed for moderate pain. 30 tablet 0   vitamin B-12 (CYANOCOBALAMIN) 1000 MCG tablet Take 1,000 mcg by mouth daily.        amLODipine (NORVASC) 5 MG tablet Take 1 tablet (5 mg total) by mouth daily. (Patient taking  differently: Take 5 mg by mouth at bedtime.) 180 tablet 3    No current facility-administered medications for this visit.      REVIEW OF SYSTEMS:    [X]  denotes positive finding, [ ]  denotes negative finding Cardiac   Comments:  Chest pain or chest pressure:      Shortness of breath upon exertion:      Short of breath when lying flat:      Irregular heart rhythm:             Vascular      Pain in calf, thigh, or hip brought on by ambulation:      Pain in feet at night that wakes you up from your sleep:       Blood clot in your veins:      Leg swelling:              Pulmonary      Oxygen at home:      Productive cough:       Wheezing:              Neurologic      Sudden weakness in arms or legs:       Sudden numbness in arms or legs:       Sudden onset of difficulty speaking or slurred speech:      Temporary loss of vision in one eye:       Problems with dizziness:  x           Gastrointestinal      Blood in stool:       Vomited blood:              Genitourinary      Burning when urinating:       Blood in urine:             Psychiatric      Major depression:              Hematologic      Bleeding problems:      Problems with  blood clotting too easily:             Skin      Rashes or ulcers:             Constitutional      Fever or chills:          PHYSICAL EXAM:       Vitals:    12/26/20 1419  BP: 127/60  Pulse: 94  Resp: 20  Temp: 98.3 F (36.8 C)  SpO2: 98%  Weight: 174 lb (78.9 kg)  Height: 5\' 5"  (1.651 m)      GENERAL: The patient is a well-nourished female, in no acute distress. The vital signs are documented above. CARDIAC: There is a regular rate and rhythm.  VASCULAR: Nonpalpable left radial pulse PULMONARY: Non-labored respirations ABDOMEN: Soft and non-tender with normal pitched bowel sounds.  MUSCULOSKELETAL: There are no major deformities or cyanosis. NEUROLOGIC: No focal weakness or paresthesias are detected. SKIN: There are no  ulcers or rashes noted. PSYCHIATRIC: The patient has a normal affect.   STUDIES:    I have reviewed the angiogram as well as CT scan showing left subclavian occlusion with reconstitution   MEDICAL ISSUES:    Symptomatic left subclavian steal syndrome: The patient is having vertebrobasilar insufficiency with dizziness from activities as well as severe left arm claudication.  We were originally going to schedule her operation several months ago however on CT scan she was found to have a basilar artery aneurysm which has subsequently been repaired.  Also on the CT scan there was concern over carotid stenosis.  The ultrasound did not show any significant stenosis and so I think this is over estimation by CT scan given the amount of calcification.  I have discussed proceeding with a left carotid to subclavian bypass graft.  We discussed the risk of lymphatic leak, phrenic nerve injury, bleeding and stroke.  All questions were answered.  I would like for her to be off of her Plavix before surgery.  She needs to be on this another few months given her recent neurosurgical procedure.  Therefore I have her scheduled for December 28, having stop her Plavix 5 days prior.       12-07-1978, MD, FACS [facility] [phone] Pager [phone]    No new complaints CV:RRR PULM:CTA Neuro intact A/P:  l;eft SCA occlusion.  Symptoms unchanged.  Plan for left CCA to SCA bypass.  All questions answered  Wells Neshawn Aird

## 2021-02-22 NOTE — Anesthesia Procedure Notes (Signed)
Arterial Line Insertion Start/End12/28/2022 10:00 AM, 02/22/2021 10:15 AM Performed by: [name], DO, anesthesiologist  Patient location: Pre-op. Preanesthetic checklist: patient identified, IV checked, site marked, risks and benefits discussed, surgical consent, monitors and equipment checked, pre-op evaluation, timeout performed and anesthesia consent Lidocaine 1% used for infiltration Right, brachial was placed Catheter size: 20 G Hand hygiene performed  and maximum sterile barriers used   Attempts: 2 Procedure performed without using ultrasound guided technique. Following insertion, dressing applied. Post procedure assessment: normal and unchanged  Post procedure complications: unsuccessful attempts, second provider assisted and local hematoma. Patient tolerated the procedure well with no immediate complications.

## 2021-02-22 NOTE — Op Note (Signed)
Patient name: Tina Navarro MRN: [id number] DOB: 04-18-1960 Sex: female  02/22/2021 Pre-operative Diagnosis: Left subclavian vertebrobasilar insufficiency Post-operative diagnosis:  Same Surgeon:  Durene Cal Assistants:  Adonis Housekeeper, PA Procedure:   Left carotid subclavian bypass with 6 mm dacryon Anesthesia:  General Blood Loss:  50 cc Specimens:  none  Findings: Small subclavian artery and so a 6 mm dacryon graft was used.  Brisk left radial Doppler signal after the case  Indications: This is a 60 year old female with symptomatic left subclavian occlusion.  She has significant left arm weakness and claudication as well as dizziness, suggestive of vertebrobasilar insufficiency.  Her surgery was delayed for repair of a basilar artery aneurysm.  Procedure:  The patient was identified in the holding area and taken to [facility] OR ROOM 12  The patient was then placed supine on the table. general anesthesia was administered.  The patient was prepped and draped in the usual sterile fashion.  A time out was called and antibiotics were administered.  A PA was necessary to expedite procedure and assist with technical details.  A transverse incision was made above the left clavicle beginning just lateral to midline and extending out laterally for 5 cm.  Cautery was used divide the subcutaneous tissue and platysma muscle.  Identified a plane between the 2 heads of the sternocleidomastoid.  I exposed the internal jugular vein and the common carotid artery and dissected these out.  The carotid artery was soft and disease-free.  It was encircled with a vessel loop.  I then mobilized the scalene fat pad lateral to the internal jugular vein.  Lymphatic branches were divided between silk ties.  I also identified the thoracic duct and ligated this between silk ties.  The anterior scalene muscle was identified.  Identified the phrenic nerve coursing on the medial side of the anterior scalene.  This was mobilized and  protected.  I then divided the anterior scalene muscle with cautery.  This exposed the subclavian artery which was circumferentially dissected free.  Once I had good exposure, the patient was fully heparinized.  After the heparin circulated, the subclavian artery was occluded with baby Gregory clamps.  A #11 blade was used to make an arteriotomy which was extended longitudinally with Potts scissors.  The subclavian artery was rather small measuring about 5 to 6 mm.  Therefore I elected to use a 6 mm dacryon graft.  This was beveled to fit the size the arteriotomy in a running anastomosis was performed with running 6-0 Prolene.  Prior to completion the appropriate flushing maneuvers were performed and the anastomosis was completed.  2 pledgeted repair stitches were required for hemostasis.  Doppler confirmed good flow within the subclavian artery.  Next, the graft was brought under the internal jugular vein up to the common carotid artery.  The graft was occluded with the fistula clamped.  I selected a site on the posterior lateral aspect of the common carotid artery for the anastomosis.  The common carotid artery was occluded with baby Gregory clamps.  A #11 blade was used to make an arteriotomy which was extended with a #5 punch.  The dacryon graft was cut to the appropriate length and beveled to fit the size of the arteriotomy.  A running anastomosis was created with 5-0 Prolene.  Prior to completion, the appropriate flushing maneuvers were performed and the anastomosis was completed.  Sequential D clamping was performed.  There was good hemostasis.  Doppler confirmed excellent blood flow within the carotid  artery as well as the subclavian artery.  The patient's heparin was then reversed with 50 mg of protamine.  The wound was irrigated.  I did not see any further bleeding or evidence of a lymphatic leak.  I elected to place a 15 Blake drain which was brought out through a stab incision and placed around the  graft.  Surgi-Flo was placed around the anastomoses.  I then reapproximated the platysma muscle with 3-0 Vicryl.  The skin was closed with a subcuticular stitch followed by Dermabond.  The patient had a brisk multiphasic left radial Doppler signal.  She was then successfully extubated taken to recovery in stable condition.   Disposition: To PACU stable.   Juleen China, M.D., [facility] V[facility] and Vein Specialists of [place] Office: [phone] Pager:  [phone]

## 2021-02-22 NOTE — Plan of Care (Signed)
  Problem: Education: Goal: Understanding of CV disease, CV risk reduction, and recovery process will improve Outcome: Progressing Goal: Individualized Educational Video(s) Outcome: Progressing   Problem: Activity: Goal: Ability to return to baseline activity level will improve Outcome: Progressing   

## 2021-02-22 NOTE — Anesthesia Procedure Notes (Signed)
Procedure Name: Intubation Date/Time: 02/22/2021 10:15 AM Performed by: Claris Che, CRNA Pre-anesthesia Checklist: Patient identified, Emergency Drugs available, Suction available, Patient being monitored and Timeout performed Patient Re-evaluated:Patient Re-evaluated prior to induction Oxygen Delivery Method: Circle system utilized Preoxygenation: Pre-oxygenation with 100% oxygen Induction Type: IV induction and Cricoid Pressure applied Ventilation: Mask ventilation without difficulty Laryngoscope Size: Mac and 4 Grade View: Grade II Tube type: Oral Tube size: 7.5 mm Number of attempts: 1 Airway Equipment and Method: Stylet Placement Confirmation: ETT inserted through vocal cords under direct vision, positive ETCO2 and breath sounds checked- equal and bilateral Secured at: 23 cm Tube secured with: Tape Dental Injury: Teeth and Oropharynx as per pre-operative assessment

## 2021-02-22 NOTE — Anesthesia Postprocedure Evaluation (Signed)
Anesthesia Post Note  Patient: Tina Navarro  Procedure(s) Performed: LEFT CAROTID-SUBCLAVIAN BYPASS USING HEAMSHIELD GOLD X30MM (Left)     Patient location during evaluation: PACU Anesthesia Type: General Level of consciousness: awake and alert, oriented and patient cooperative Pain management: pain level controlled Vital Signs Assessment: post-procedure vital signs reviewed and stable Respiratory status: spontaneous breathing, nonlabored ventilation and respiratory function stable Cardiovascular status: blood pressure returned to baseline and stable Postop Assessment: no apparent nausea or vomiting Anesthetic complications: no   No notable events documented.  Last Vitals:  Vitals:   02/22/21 1302 02/22/21 1317  BP: 130/66 118/63  Pulse: 94 86  Resp: 11 16  Temp: 36.6 C   SpO2: 94% 98%    Last Pain:  Vitals:   02/22/21 1302  TempSrc:   PainSc: 2                  [name]

## 2021-02-23 ENCOUNTER — Encounter (HOSPITAL_COMMUNITY): Payer: Self-pay | Admitting: Surgery

## 2021-02-23 DIAGNOSIS — Z95828 Presence of other vascular implants and grafts: Secondary | ICD-10-CM

## 2021-02-23 LAB — CBC
HCT: 35 % — ABNORMAL LOW (ref 36.0–46.0)
Hemoglobin: 11 g/dL — ABNORMAL LOW (ref 12.0–15.0)
MCH: 24.8 pg — ABNORMAL LOW (ref 26.0–34.0)
MCHC: 31.4 g/dL (ref 30.0–36.0)
MCV: 78.8 fL — ABNORMAL LOW (ref 80.0–100.0)
Platelets: 352 10*3/uL (ref 150–400)
RBC: 4.44 MIL/uL (ref 3.87–5.11)
RDW: 15.3 % (ref 11.5–15.5)
WBC: 11.4 10*3/uL — ABNORMAL HIGH (ref 4.0–10.5)
nRBC: 0 % (ref 0.0–0.2)

## 2021-02-23 LAB — BASIC METABOLIC PANEL
Anion gap: 9 (ref 5–15)
BUN: 18 mg/dL (ref 6–20)
CO2: 20 mmol/L — ABNORMAL LOW (ref 22–32)
Calcium: 8.4 mg/dL — ABNORMAL LOW (ref 8.9–10.3)
Chloride: 107 mmol/L (ref 98–111)
Creatinine, Ser: 0.92 mg/dL (ref 0.44–1.00)
GFR, Estimated: >60 mL/min (ref >60)
Glucose, Bld: 153 mg/dL — ABNORMAL HIGH (ref 70–99)
Potassium: 3.7 mmol/L (ref 3.5–5.1)
Sodium: 136 mmol/L (ref 135–145)

## 2021-02-23 LAB — LIPID PANEL
Cholesterol: 146 mg/dL (ref 0–200)
HDL: 39 mg/dL — ABNORMAL LOW (ref >40)
LDL Cholesterol: 83 mg/dL (ref 0–99)
Total CHOL/HDL Ratio: 3.7 RATIO
Triglycerides: 121 mg/dL (ref <150)
VLDL: 24 mg/dL (ref 0–40)

## 2021-02-23 MED ORDER — FUROSEMIDE 10 MG/ML IJ SOLN
20.0000 mg | Freq: Once | INTRAMUSCULAR | Status: AC
  Administered 2021-02-23: 14:00:00 20 mg via INTRAVENOUS
  Filled 2021-02-23: qty 2

## 2021-02-23 MED ORDER — CLOPIDOGREL BISULFATE 75 MG PO TABS
75.0000 mg | ORAL_TABLET | Freq: Every day | ORAL | Status: DC
  Administered 2021-02-23 – 2021-02-25 (×3): 75 mg via ORAL
  Filled 2021-02-23 (×3): qty 1

## 2021-02-23 NOTE — Progress Notes (Addendum)
°  Progress Note    02/23/2021 7:37 AM 1 Day Post-Op  Subjective:  headache and pain in eyes. Says she is very sensitive to light   Vitals:   02/23/21 0436 02/23/21 0500  BP: (!) 103/56 111/61  Pulse: 96 90  Resp: 18 11  Temp: 97.6 F (36.4 C)   SpO2: 96%    Physical Exam: Cardiac:  regular Lungs:  non labored  Incisions:  left supraclavicular incision is intact and well appearing, no swelling or hematoma Extremities:  well perfused and warm. Motor and sensation intact. Normal grip strength 5/5 bilaterally Neurologic: alert and oriented  CBC    Component Value Date/Time   WBC 11.4 (H) 02/23/2021 0251   RBC 4.44 02/23/2021 0251   HGB 11.0 (L) 02/23/2021 0251   HCT 35.0 (L) 02/23/2021 0251   PLT 352 02/23/2021 0251   MCV 78.8 (L) 02/23/2021 0251   MCH 24.8 (L) 02/23/2021 0251   MCHC 31.4 02/23/2021 0251   RDW 15.3 02/23/2021 0251   LYMPHSABS 1.9 11/08/2020 0848   MONOABS 0.5 11/08/2020 0848   EOSABS 0.3 11/08/2020 0848   BASOSABS 0.1 11/08/2020 0848    BMET    Component Value Date/Time   NA 136 02/23/2021 0251   K 3.7 02/23/2021 0251   CL 107 02/23/2021 0251   CO2 20 (L) 02/23/2021 0251   GLUCOSE 153 (H) 02/23/2021 0251   BUN 18 02/23/2021 0251   CREATININE 0.92 02/23/2021 0251   CREATININE 0.86 09/02/2020 0000   CALCIUM 8.4 (L) 02/23/2021 0251   GFRNONAA >60 02/23/2021 0251   GFRNONAA 74 09/02/2020 0000   GFRAA 86 09/02/2020 0000    INR    Component Value Date/Time   INR 0.9 02/21/2021 0904     Intake/Output Summary (Last 24 hours) at 02/23/2021 0737 Last data filed at 02/23/2021 0500 Gross per 24 hour  Intake 1600.21 ml  Output 755 ml  Net 845.21 ml     Assessment/Plan:  60 y.o. female is s/p left carotid subclavian bypass 1 Day Post-Op   Pain well controlled Left neck incision is clean, dry and intact without swelling or hematoma Drain with 70 cc serosanguinous output overnight Hemodynamically stable Tolerating diet Has not  ambulated yet Will restart home Asa and Plavix  Possible discharge this afternoon She will have follow up in our office in 2 weeks for incision check   Dory Horn [facility] [phone] 02/23/2021 7:37 AM  I agree with the above.  I have seen and evaluated the patient.  She is postoperative day 1 from a left carotid subclavian bypass graft.  Her arm is warm and well-perfused on the left.  She is back on aspirin and Plavix.  She will continue with Cipro for a urinary tract infection.  She is complaining of some shortness of breath as well as swelling in all of her extremities.  I am giving her a dose of Lasix to see if this helps.  I will keep her drain in overnight to monitor the output.  Hopefully she can go home tomorrow.  Durene Cal

## 2021-02-23 NOTE — Discharge Instructions (Signed)
   [facility]ists of [place]  Discharge Instructions   Carotid Surgery  Please refer to the following instructions for your post-procedure care. Your surgeon or physician assistant will discuss any changes with you.  Activity  You are encouraged to walk as much as you can. You can slowly return to normal activities but must avoid strenuous activity and heavy lifting until your doctor tell you it's okay. Avoid activities such as vacuuming or swinging a golf club. You can drive after one week if you are comfortable and you are no longer taking prescription pain medications. It is normal to feel tired for serval weeks after your surgery. It is also normal to have difficulty with sleep habits, eating, and bowel movements after surgery. These will go away with time.  Bathing/Showering  Shower daily after you go home. Do not soak in a bathtub, hot tub, or swim until the incision heals completely.  Incision Care  Shower every day. Clean your incision with mild soap and water. Pat the area dry with a clean towel. You do not need a bandage unless otherwise instructed. Do not apply any ointments or creams to your incision. You may have skin glue on your incision. Do not peel it off. It will come off on its own in about one week. Your incision may feel thickened and raised for several weeks after your surgery. This is normal and the skin will soften over time.   For Men Only: It's okay to shave around the incision but do not shave the incision itself for 2 weeks. It is common to have numbness under your chin that could last for several months.  Diet  Resume your normal diet. There are no special food restrictions following this procedure. A low fat/low cholesterol diet is recommended for all patients with vascular disease. In order to heal from your surgery, it is CRITICAL to get adequate nutrition. Your body requires vitamins, minerals, and protein. Vegetables are the best source of  vitamins and minerals. Vegetables also provide the perfect balance of protein. Processed food has little nutritional value, so try to avoid this.  Medications  Resume taking all of your medications unless your doctor or physician assistant tells you not to. If your incision is causing pain, you may take over-the- counter pain relievers such as acetaminophen (Tylenol). If you were prescribed a stronger pain medication, please be aware these medications can cause nausea and constipation. Prevent nausea by taking the medication with a snack or meal. Avoid constipation by drinking plenty of fluids and eating foods with a high amount of fiber, such as fruits, vegetables, and grains.   Do not take Tylenol if you are taking prescription pain medications.  Follow Up  Our office will schedule a follow up appointment 2-3 weeks following discharge.  Please call us immediately for any of the following conditions  . Increased pain, redness, drainage (pus) from your incision site. . Fever of 101 degrees or higher. . If you should develop stroke (slurred speech, difficulty swallowing, weakness on one side of your body, loss of vision) you should call 911 and go to the nearest emergency room. .  Reduce your risk of vascular disease:  . Stop smoking. If you would like help call [facility] at 1-800-QUIT-NOW (1-800-784-8669) or [place] at 336-586-4000. . Manage your cholesterol . Maintain a desired weight . Control your diabetes . Keep your blood pressure down .  If you have any questions, please call the office at [phone]. 

## 2021-02-24 ENCOUNTER — Inpatient Hospital Stay (HOSPITAL_COMMUNITY): Payer: Federal, State, Local not specified - PPO

## 2021-02-24 LAB — TROPONIN I (HIGH SENSITIVITY)
Troponin I (High Sensitivity): 4 ng/L (ref <18)
Troponin I (High Sensitivity): 5 ng/L (ref <18)

## 2021-02-24 MED ORDER — FUROSEMIDE 10 MG/ML IJ SOLN
20.0000 mg | Freq: Once | INTRAMUSCULAR | Status: AC
  Administered 2021-02-24: 13:00:00 20 mg via INTRAVENOUS
  Filled 2021-02-24: qty 2

## 2021-02-24 NOTE — Progress Notes (Signed)
Mobility Specialist: Progress Note   02/24/21 1618  Mobility  Activity Ambulated in hall  Level of Assistance Independent  Assistive Device None  Distance Ambulated (ft) 480 ft (240'x2)  Mobility Ambulated independently in hallway  Mobility Response Tolerated fair  Mobility performed by Mobility specialist  $Mobility charge 1 Mobility   Pre-Mobility: 98 HR During Mobility: 123 HR, 92% SpO2 Post-Mobility: 93 HR  Pt stopped x1 for short standing break d/t feeling SOB. Pt coached through pursed lip breathing, sats as seen above. Pt back to bed after walk with call bell in reach and family present in the room.   [facility] Aayra Hornbaker Mobility Specialist Mobility Specialist [address] [place]: [phone] Mobility Specialist [address] [place] [address] [place]: [phone]

## 2021-02-24 NOTE — Progress Notes (Addendum)
°  Progress Note    02/24/2021 6:58 AM 2 Days Post-Op  Subjective:  c/o pain in the substernal area says it hurts when she moves, drinks water, breathes.  Says it does not hurt when she is laying still.  C/o right flank pain also.    Afebrile HR 80's-110's 120's-130's systolic 90% RA  Vitals:   02/23/21 2055 02/24/21 0330  BP:  120/68  Pulse: 94 93  Resp: 20 18  Temp: 98.1 F (36.7 C) 98.1 F (36.7 C)  SpO2: 91% 90%    Physical Exam: Cardiac:  regular Lungs:  non labored Incisions:  left neck incision is clean and dry Extremities:  moving all extremities equally Back:  non painful to palpation right flank.    CBC    Component Value Date/Time   WBC 11.4 (H) 02/23/2021 0251   RBC 4.44 02/23/2021 0251   HGB 11.0 (L) 02/23/2021 0251   HCT 35.0 (L) 02/23/2021 0251   PLT 352 02/23/2021 0251   MCV 78.8 (L) 02/23/2021 0251   MCH 24.8 (L) 02/23/2021 0251   MCHC 31.4 02/23/2021 0251   RDW 15.3 02/23/2021 0251   LYMPHSABS 1.9 11/08/2020 0848   MONOABS 0.5 11/08/2020 0848   EOSABS 0.3 11/08/2020 0848   BASOSABS 0.1 11/08/2020 0848    BMET    Component Value Date/Time   NA 136 02/23/2021 0251   K 3.7 02/23/2021 0251   CL 107 02/23/2021 0251   CO2 20 (L) 02/23/2021 0251   GLUCOSE 153 (H) 02/23/2021 0251   BUN 18 02/23/2021 0251   CREATININE 0.92 02/23/2021 0251   CREATININE 0.86 09/02/2020 0000   CALCIUM 8.4 (L) 02/23/2021 0251   GFRNONAA >60 02/23/2021 0251   GFRNONAA 74 09/02/2020 0000   GFRAA 86 09/02/2020 0000    INR    Component Value Date/Time   INR 0.9 02/21/2021 0904     Intake/Output Summary (Last 24 hours) at 02/24/2021 0658 Last data filed at 02/24/2021 0347 Gross per 24 hour  Intake 480 ml  Output 35 ml  Net 445 ml    JP drain output:  35cc/24hr (15cc last shift)   Assessment/Plan:  60 y.o. female is s/p:  Left carotid subclavian bypass with 6 mm dacryon  2 Days Post-Op   -pt neuro in tact.   -C/o substernal chest pain with  movement.  She has hx of GERD and on Protonix 80mg  daily.  Given she has this with movement, most likely not related to cardiac issues, but will order troponin and EKG this am.   -JP drain with 35cc out in the past 24 hrs-will discontinue drain.  -DVT prophylaxis:  sq heparin Continue asa/plavix/statin   , PA-C [facility] [phone] 02/24/2021 6:58 AM  I agree with the above.  Have seen and evaluated the patient.  She is postoperative day 2 from her left carotid subclavian bypass graft.  She feels much better today.  She was having some chest pain and so troponins were sent which were negative.  X-ray did show pulmonary edema.  I am giving her another dose of Lasix today.  She feels like her swelling is much better today.  She has a palpable radial pulse on the left.  She is back on antibiotics for a UTI.  Her JP will be removed today.  She is also back on her Plavix.  I anticipate her being able to go home tomorrow  02/26/2021

## 2021-02-25 LAB — CBC
HCT: 31.8 % — ABNORMAL LOW (ref 36.0–46.0)
Hemoglobin: 10.1 g/dL — ABNORMAL LOW (ref 12.0–15.0)
MCH: 25 pg — ABNORMAL LOW (ref 26.0–34.0)
MCHC: 31.8 g/dL (ref 30.0–36.0)
MCV: 78.7 fL — ABNORMAL LOW (ref 80.0–100.0)
Platelets: 298 10*3/uL (ref 150–400)
RBC: 4.04 MIL/uL (ref 3.87–5.11)
RDW: 16 % — ABNORMAL HIGH (ref 11.5–15.5)
WBC: 7.6 10*3/uL (ref 4.0–10.5)
nRBC: 0 % (ref 0.0–0.2)

## 2021-02-25 LAB — BASIC METABOLIC PANEL
Anion gap: 9 (ref 5–15)
BUN: 15 mg/dL (ref 6–20)
CO2: 25 mmol/L (ref 22–32)
Calcium: 8.4 mg/dL — ABNORMAL LOW (ref 8.9–10.3)
Chloride: 106 mmol/L (ref 98–111)
Creatinine, Ser: 0.86 mg/dL (ref 0.44–1.00)
GFR, Estimated: >60 mL/min (ref >60)
Glucose, Bld: 103 mg/dL — ABNORMAL HIGH (ref 70–99)
Potassium: 3.3 mmol/L — ABNORMAL LOW (ref 3.5–5.1)
Sodium: 140 mmol/L (ref 135–145)

## 2021-02-25 MED ORDER — OXYCODONE-ACETAMINOPHEN 5-325 MG PO TABS: 1.0000 | ORAL_TABLET | Freq: Four times a day (QID) | ORAL | 0 refills | Status: DC | PRN

## 2021-02-25 MED ORDER — FUROSEMIDE 10 MG/ML IJ SOLN
20.0000 mg | Freq: Once | INTRAMUSCULAR | Status: AC
  Administered 2021-02-25: 20 mg via INTRAVENOUS
  Filled 2021-02-25: qty 2

## 2021-02-25 MED ORDER — POTASSIUM CHLORIDE CRYS ER 20 MEQ PO TBCR
40.0000 meq | EXTENDED_RELEASE_TABLET | Freq: Every day | ORAL | Status: AC | PRN
  Administered 2021-02-25: 40 meq via ORAL
  Filled 2021-02-25: qty 2

## 2021-02-25 NOTE — Discharge Summary (Signed)
Discharge Summary    Tina Navarro 06/15/1960 60 y.o. female  [id number]  Admission Date: 02/22/2021  Discharge Date: 02/25/2021  Physician: Nada LibmanBrabham, Vance W, MD  Admission Diagnosis: Left subclavian artery occlusion [I70.8]   HPI:   This is a 60 y.o. female who has a history of percutaneous intervention to her legs in 2007.  [facility]  I performed angiography on 07/16/2017 and performed angioplasty of bilateral common iliac stenosis.  She had recurrent symptoms shortly thereafter and on 12/02/2018 I placed bilateral common iliac VBX stents (8 x 39).  She has had issues with left arm weakness while washing her hair.  She has a known left subclavian stenosis which was confirmed with angiography.  Her symptoms have been tolerable however they have progressed.  I sent her for CT scan to better evaluate this for surgical reconstruction.  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  She is still having issues with left arm weakness and dizziness  Hospital Course:  The patient was admitted to the hospital and taken to the operating room on 02/22/2021 and underwent: Left carotid subclavian bypass with 6 mm dacryon    Findings:  small subclavian artery and so a 6 mm dacryon graft was used.  Brisk left radial Doppler signal after the case  The pt tolerated the procedure well and was transported to the PACU in good condition.   POD 1, her left arm was warm and well perfused.  She was back on her asa/Plavix and continue Cipro for UTI.  She was having some sob & some swelling in all of her extremities and was given lasix.  Her JP drain was left another day.    POD 2, she was having some substernal chest pain (appeared reproducible) and troponin was negative. Xray did show some pulmonary edema and she received another dose of lasix.  Her JP drain was removed.   POD 3, pt was feeling much better.  Dizziness had  resolved.  She had palpable left radial pulse and incision looked good.  She had significant improvement with diuresis.  She did have hypokalemia and this was replaced.  She looked good overall and was discharged home.     CBC    Component Value Date/Time   WBC 7.6 02/25/2021 0312   RBC 4.04 02/25/2021 0312   HGB 10.1 (L) 02/25/2021 0312   HCT 31.8 (L) 02/25/2021 0312   PLT 298 02/25/2021 0312   MCV 78.7 (L) 02/25/2021 0312   MCH 25.0 (L) 02/25/2021 0312   MCHC 31.8 02/25/2021 0312   RDW 16.0 (H) 02/25/2021 0312   LYMPHSABS 1.9 11/08/2020 0848   MONOABS 0.5 11/08/2020 0848   EOSABS 0.3 11/08/2020 0848   BASOSABS 0.1 11/08/2020 0848    BMET    Component Value Date/Time   NA 140 02/25/2021 0312   K 3.3 (L) 02/25/2021 0312   CL 106 02/25/2021 0312   CO2 25 02/25/2021 0312   GLUCOSE 103 (H) 02/25/2021 0312   BUN 15 02/25/2021 0312   CREATININE 0.86 02/25/2021 0312   CREATININE 0.86 09/02/2020 0000   CALCIUM 8.4 (L) 02/25/2021 0312   GFRNONAA >60 02/25/2021 0312   GFRNONAA 74 09/02/2020 0000   GFRAA 86 09/02/2020 0000      Discharge Instructions     Discharge patient   Complete by: As directed    Discharge home after lunch.   Discharge disposition: 01-Home or Self Care   Discharge patient  date: 02/25/2021       Discharge Diagnosis:  Left subclavian artery occlusion [I70.8]  Secondary Diagnosis: Patient Active Problem List   Diagnosis Date Noted   Left subclavian artery occlusion 02/22/2021   Aneurysm, cerebral, nonruptured 12/02/2020   Anxiety state 12/25/2017   Irritant contact dermatitis due to drug in contact with skin 12/25/2017   Thyroid nodule 04/23/2017   Emphysematous COPD (HCC) 04/23/2017   Rib pain on left side 04/23/2017   Neuropathy involving both lower extremities 04/22/2017   Pleuritic chest pain 04/22/2017   Essential hypertension 05/01/2016   Abnormal findings on esophagogastroduodenoscopy (EGD) 03/18/2016   BPPV (benign paroxysmal  positional vertigo) 06/28/2015   Irritable bowel syndrome 04/01/2015   Dyslipidemia, goal LDL below 70 04/01/2015   Current tobacco use 07/28/2012   Multinodular goiter 12/19/2011   CN (constipation) 07/28/2011   Dyslipidemia 07/28/2011   Acid reflux 09/25/2006   Peripheral vascular disease (HCC) 09/25/2006   Past Medical History:  Diagnosis Date   Anxiety    Basilar artery aneurysm (HCC)    s/p stent supported coil embolization of basilar aneurysm 12/02/20   Dyspnea    Emphysematous COPD (HCC) 04/23/2017   GERD (gastroesophageal reflux disease)    Hyperlipidemia    Hypertension    Peripheral arterial disease (HCC)    Thyroid nodule 04/23/2017     Allergies as of 02/25/2021       Reactions   Doxycycline Hives, Rash, Other (See Comments)   Pain, photosensitive        Medication List     STOP taking these medications    BC HEADACHE POWDER PO   traMADol 50 MG tablet Commonly known as: ULTRAM       TAKE these medications    acetaminophen 500 MG tablet Commonly known as: TYLENOL Take 1,000 mg by mouth every 6 (six) hours as needed for headache.   albuterol 108 (90 Base) MCG/ACT inhaler Commonly known as: VENTOLIN HFA Inhale 1-2 puffs into the lungs every 4 (four) hours as needed for wheezing or shortness of breath.   amLODipine 5 MG tablet Commonly known as: NORVASC Take 5 mg by mouth at bedtime.   aspirin 325 MG tablet Take 325 mg by mouth daily.   atorvastatin 80 MG tablet Commonly known as: LIPITOR Take 1 tablet (80 mg total) by mouth at bedtime.   ciprofloxacin 500 MG tablet Commonly known as: CIPRO Take 1 tablet (500 mg total) by mouth 2 (two) times daily.   clopidogrel 75 MG tablet Commonly known as: PLAVIX Take 1 tablet (75 mg total) by mouth daily.   losartan 100 MG tablet Commonly known as: COZAAR Take 1 tablet (100 mg total) by mouth daily. What changed: when to take this   omeprazole 40 MG capsule Commonly known as: PRILOSEC Take  1 capsule (40 mg total) by mouth daily.   oxyCODONE-acetaminophen 5-325 MG tablet Commonly known as: Percocet Take 1 tablet by mouth every 6 (six) hours as needed for severe pain.   promethazine 12.5 MG tablet Commonly known as: PHENERGAN Take 1 tablet (12.5 mg total) by mouth every 4 (four) hours as needed for nausea or vomiting.   senna 8.6 MG tablet Commonly known as: SENOKOT Take 2 tablets (17.2 mg total) by mouth daily. As needed for constipation What changed:  when to take this additional instructions   vitamin B-12 1000 MCG tablet Commonly known as: CYANOCOBALAMIN Take 1,000 mcg by mouth daily.   VITAMIN D3 PO Take 1 tablet by mouth in the  morning.        Prescriptions given: Roxicet #12 No Refill  Instructions:   [facility]  Discharge Instructions   Carotid Surgery  Please refer to the following instructions for your post-procedure care. Your surgeon or physician assistant will discuss any changes with you.  Activity  You are encouraged to walk as much as you can. You can slowly return to normal activities but must avoid strenuous activity and heavy lifting until your doctor tell you it's okay. Avoid activities such as vacuuming or swinging a golf club. You can drive after one week if you are comfortable and you are no longer taking prescription pain medications. It is normal to feel tired for serval weeks after your surgery. It is also normal to have difficulty with sleep habits, eating, and bowel movements after surgery. These will go away with time.  Bathing/Showering  Shower daily after you go home. Do not soak in a bathtub, hot tub, or swim until the incision heals completely.  Incision Care  Shower every day. Clean your incision with mild soap and water. Pat the area dry with a clean towel. You do not need a bandage unless otherwise instructed. Do not apply any ointments or creams to your incision. You may have skin  glue on your incision. Do not peel it off. It will come off on its own in about one week. Your incision may feel thickened and raised for several weeks after your surgery. This is normal and the skin will soften over time.   For Men Only: It's okay to shave around the incision but do not shave the incision itself for 2 weeks. It is common to have numbness under your chin that could last for several months.  Diet  Resume your normal diet. There are no special food restrictions following this procedure. A low fat/low cholesterol diet is recommended for all patients with vascular disease. In order to heal from your surgery, it is CRITICAL to get adequate nutrition. Your body requires vitamins, minerals, and protein. Vegetables are the best source of vitamins and minerals. Vegetables also provide the perfect balance of protein. Processed food has little nutritional value, so try to avoid this.  Medications  Resume taking all of your medications unless your doctor or physician assistant tells you not to. If your incision is causing pain, you may take over-the- counter pain relievers such as acetaminophen (Tylenol). If you were prescribed a stronger pain medication, please be aware these medications can cause nausea and constipation. Prevent nausea by taking the medication with a snack or meal. Avoid constipation by drinking plenty of fluids and eating foods with a high amount of fiber, such as fruits, vegetables, and grains.  Do not take Tylenol if you are taking prescription pain medications.  Follow Up  Our office will schedule a follow up appointment 2-3 weeks following discharge.  Please call us immediately for any of the following conditions  Increased pain, redness, drainage (pus) from your incision site. Fever of 101 degrees or higher. If you should develop stroke (slurred speech, difficulty swallowing, weakness on one side of your body, loss of vision) you should call 911 and go to the  nearest emergency room.  Reduce your risk of vascular disease:  Stop smoking. If you would like help call [facility] at 1-800-QUIT-NOW [phone]) or [place] at [phone]. Manage your cholesterol Maintain a desired weight Control your diabetes Keep your blood pressure down  If you have any questions, please  call the office at [phone].   Disposition: home  Patient's condition: is Good  Follow up: 1. [facility] in 2-3 weeks   Leontine Locket, PA-C [facility] [phone] 02/25/2021  10:11 AM

## 2021-02-25 NOTE — Progress Notes (Addendum)
°  Progress Note    02/25/2021 7:16 AM 3 Days Post-Op  Subjective:  says she feels better but still has soreness in her substernal area and back when she coughs.  She has ambulated in the hall and her room.   Afebrile HR 90's-110's  110's-130's systolic  90% RA  Vitals:   02/24/21 2309 02/25/21 0310  BP: 111/60 124/60  Pulse: 89 96  Resp: 17 20  Temp: 98 F (36.7 C) 98.1 F (36.7 C)  SpO2: 90% 90%    Physical Exam: Cardiac:  regular Lungs:  non labored Incisions:  clean and dry without hematoma Extremities:  moving all extremities equally   CBC    Component Value Date/Time   WBC 7.6 02/25/2021 0312   RBC 4.04 02/25/2021 0312   HGB 10.1 (L) 02/25/2021 0312   HCT 31.8 (L) 02/25/2021 0312   PLT 298 02/25/2021 0312   MCV 78.7 (L) 02/25/2021 0312   MCH 25.0 (L) 02/25/2021 0312   MCHC 31.8 02/25/2021 0312   RDW 16.0 (H) 02/25/2021 0312   LYMPHSABS 1.9 11/08/2020 0848   MONOABS 0.5 11/08/2020 0848   EOSABS 0.3 11/08/2020 0848   BASOSABS 0.1 11/08/2020 0848    BMET    Component Value Date/Time   NA 140 02/25/2021 0312   K 3.3 (L) 02/25/2021 0312   CL 106 02/25/2021 0312   CO2 25 02/25/2021 0312   GLUCOSE 103 (H) 02/25/2021 0312   BUN 15 02/25/2021 0312   CREATININE 0.86 02/25/2021 0312   CREATININE 0.86 09/02/2020 0000   CALCIUM 8.4 (L) 02/25/2021 0312   GFRNONAA >60 02/25/2021 0312   GFRNONAA 74 09/02/2020 0000   GFRAA 86 09/02/2020 0000    INR    Component Value Date/Time   INR 0.9 02/21/2021 0904     Intake/Output Summary (Last 24 hours) at 02/25/2021 0716 Last data filed at 02/25/2021 7628 Gross per 24 hour  Intake 1200 ml  Output 0 ml  Net 1200 ml     Assessment/Plan:  60 y.o. female is s/p:  Left carotid subclavian bypass with 6 mm dacryon   3 Days Post-Op   -pt doing well and neuro in tact.  Says she feels better after the lasix yesterday.  Still gets somewhat short of breath with activity.  Pleural effusions on cxr.  May benefit  from short course of lasix at home and f/u with PCP next week. Will d/w Dr. Chestine Spore.  -hypokalemia of 3.3 today after diuresis-will supplement -continue asa/plavix/statin -DVT prophylaxis:  sq heparin  -continue abx for UTI   Doreatha Massed, PA-C [facility] [phone] 02/25/2021 7:16 AM   I have seen and evaluated the patient. I agree with the PA note as documented above.  Status post left carotid subclavian bypass by Dr. Myra Gianotti for vertebrobasilar insufficiency.  All of her dizziness has resolved.  Incision looks good.  Palpable radial pulse at the left wrist.  She has seen significant improvement with diuresis over the last several days.  We will replace her potassium this morning and give her 1 more dose of 20 mg IV Lasix given this has helped her breathing significantly.  Does not appear volume overloaded and no lower extremity edema.  Plan discharge today on aspirin statin Plavix.  She has follow-up with Dr. Myra Gianotti.  Cephus Shelling, MD [facility]: [phone]

## 2021-02-25 NOTE — Progress Notes (Signed)
Mobility Specialist: Progress Note   02/25/21 1053  Mobility  Activity Ambulated in hall  Level of Assistance Independent  Assistive Device None  Distance Ambulated (ft) 1000 ft  Mobility Ambulated independently in hallway  Mobility Response Tolerated well  Mobility performed by Mobility specialist  $Mobility charge 1 Mobility   Pre-Mobility: 98 HR During Mobility: 128 HR Post-Mobility: 105 HR  Pt endorsed mild chest pain and some SOB during ambulation. Pt did not have to stop for any breaks today and was able to recover quicker after returning to the room. Pt back to bed after walk with call bell at her side and pt's husband present in the room.   [facility] Tina Navarro Mobility Specialist Mobility Specialist 4 [place]: [phone] Mobility Specialist 2 [place] and [address] [place]: [phone]

## 2021-02-25 NOTE — Progress Notes (Signed)
Pt discharging home with husband. AVS reviewed with pt and all questions answered. Pt has packed up all of her belongings. IVs and telemetry box removed.

## 2021-03-13 ENCOUNTER — Other Ambulatory Visit: Payer: Self-pay

## 2021-03-13 ENCOUNTER — Ambulatory Visit (INDEPENDENT_AMBULATORY_CARE_PROVIDER_SITE_OTHER): Payer: Federal, State, Local not specified - PPO | Admitting: Physician Assistant

## 2021-03-13 VITALS — BP 119/72 | HR 95 | Temp 98.1°F | Resp 20 | Ht 65.0 in | Wt 174.7 lb

## 2021-03-13 DIAGNOSIS — G450 Vertebro-basilar artery syndrome: Secondary | ICD-10-CM

## 2021-03-13 NOTE — Progress Notes (Signed)
POST OPERATIVE OFFICE NOTE    CC:  F/u for surgery  HPI:  This is a 61 y.o. female who is s/p left carotid subclavian bypass with 51mm Dacryon  on 02/22/2021 by Dr. Myra Gianotti for left subclavian vertebrobasilar insufficiency.  She was having significant left arm weakness as well as dizziness and left arm fatigue.    She has hx of bilateral CIA stenting and last ABI was in August 2022 revealing normal ABI with triphasic waveforms.  She does have some right ICA stenosis as her duplex in August 2022 revealed 40-59% ICA stenosis on the right.  Also at that time her bilateral CIA stents were patent without stenosis.  Pt returns today for follow up.  Pt states she is doing much better.  Her dizziness has improved.  Her headaches have improved.  She has had ringing in her ears for years and it used to subside, but recently, it does not subside.    She states that after the diuretic in the hospital, her breathing has been much better and the pain in her back also resolved.  She states she is able to go to [place], park away from the door, do her shopping without stopping.  She feels her energy has also improved.  She has had some blurry vision but feels it might be due to her prescription.    Allergies  Allergen Reactions   Doxycycline Hives, Rash and Other (See Comments)    Pain, photosensitive    Current Outpatient Medications  Medication Sig Dispense Refill   acetaminophen (TYLENOL) 500 MG tablet Take 1,000 mg by mouth every 6 (six) hours as needed for headache.     albuterol (VENTOLIN HFA) 108 (90 Base) MCG/ACT inhaler Inhale 1-2 puffs into the lungs every 4 (four) hours as needed for wheezing or shortness of breath. 1 each 2   amLODipine (NORVASC) 5 MG tablet Take 5 mg by mouth at bedtime.     aspirin 325 MG tablet Take 325 mg by mouth daily.     atorvastatin (LIPITOR) 80 MG tablet Take 1 tablet (80 mg total) by mouth at bedtime. 90 tablet 3   Cholecalciferol (VITAMIN D3 PO) Take 1 tablet by  mouth in the morning.     ciprofloxacin (CIPRO) 500 MG tablet Take 1 tablet (500 mg total) by mouth 2 (two) times daily. 14 tablet 0   clopidogrel (PLAVIX) 75 MG tablet Take 1 tablet (75 mg total) by mouth daily. 90 tablet 1   losartan (COZAAR) 100 MG tablet Take 1 tablet (100 mg total) by mouth daily. (Patient taking differently: Take 100 mg by mouth at bedtime.) 90 tablet 3   omeprazole (PRILOSEC) 40 MG capsule Take 1 capsule (40 mg total) by mouth daily. 90 capsule 3   oxyCODONE-acetaminophen (PERCOCET) 5-325 MG tablet Take 1 tablet by mouth every 6 (six) hours as needed for severe pain. 12 tablet 0   promethazine (PHENERGAN) 12.5 MG tablet Take 1 tablet (12.5 mg total) by mouth every 4 (four) hours as needed for nausea or vomiting. 30 tablet 1   senna (SENOKOT) 8.6 MG tablet Take 2 tablets (17.2 mg total) by mouth daily. As needed for constipation (Patient taking differently: Take 2 tablets by mouth at bedtime.) 90 tablet 1   vitamin B-12 (CYANOCOBALAMIN) 1000 MCG tablet Take 1,000 mcg by mouth daily.      No current facility-administered medications for this visit.     ROS:  See HPI  Physical Exam:  Today's Vitals   03/13/21  1133 03/13/21 1135  BP: 120/73 119/72  Pulse: 95   Resp: 20   Temp: 98.1 F (36.7 C)   TempSrc: Temporal   SpO2: 97%   Weight: 174 lb 11.2 oz (79.2 kg)   Height: 5\' 5"  (1.651 m)    Body mass index is 29.07 kg/m.   Incision:  healing nicely Extremities:  moving all extremities equally; palpable left radial pulse present; minimal lower extremity edema. Neuro: in tact    Assessment/Plan:  This is a 61 y.o. female who is s/p: left carotid subclavian bypass with 59mm Dacryon  on 02/22/2021 by Dr. 02/24/2021 for left subclavian vertebrobasilar insufficiency.   -pt doing well from surgical standpoint and has a palpable left radial pulse.  Blood pressures are equal in both arms.  Dizziness and headaches have improved.   -breathing improved after IV lasix in  hospital.  -will have pt return in August for regular surveillence of ABI, bilateral aorto iliac and ABI and carotid duplex.  She will call sooner if there are any issues.   -Dr. September let pt know she is okay for baby aspirin daily.  She can stop plavix from vascular standpoint.  -continue statin   Myra Gianotti, [facility] [phone]   Clinic MD:  pt seen with Dr. [phone]

## 2021-03-14 ENCOUNTER — Other Ambulatory Visit: Payer: Self-pay | Admitting: *Deleted

## 2021-03-14 DIAGNOSIS — I779 Disorder of arteries and arterioles, unspecified: Secondary | ICD-10-CM

## 2021-03-14 DIAGNOSIS — I739 Peripheral vascular disease, unspecified: Secondary | ICD-10-CM

## 2021-03-14 DIAGNOSIS — I6523 Occlusion and stenosis of bilateral carotid arteries: Secondary | ICD-10-CM

## 2021-03-23 ENCOUNTER — Other Ambulatory Visit: Payer: Self-pay | Admitting: Osteopathic Medicine

## 2021-04-10 ENCOUNTER — Ambulatory Visit (INDEPENDENT_AMBULATORY_CARE_PROVIDER_SITE_OTHER): Payer: Federal, State, Local not specified - PPO | Admitting: Cardiology

## 2021-04-10 ENCOUNTER — Encounter: Payer: Self-pay | Admitting: Cardiology

## 2021-04-10 ENCOUNTER — Other Ambulatory Visit: Payer: Self-pay

## 2021-04-10 VITALS — BP 135/78 | HR 90 | Ht 65.0 in | Wt 175.4 lb

## 2021-04-10 DIAGNOSIS — I70213 Atherosclerosis of native arteries of extremities with intermittent claudication, bilateral legs: Secondary | ICD-10-CM

## 2021-04-10 DIAGNOSIS — E7800 Pure hypercholesterolemia, unspecified: Secondary | ICD-10-CM | POA: Diagnosis not present

## 2021-04-10 DIAGNOSIS — R072 Precordial pain: Secondary | ICD-10-CM | POA: Diagnosis not present

## 2021-04-10 DIAGNOSIS — I10 Essential (primary) hypertension: Secondary | ICD-10-CM

## 2021-04-10 DIAGNOSIS — I739 Peripheral vascular disease, unspecified: Secondary | ICD-10-CM | POA: Diagnosis not present

## 2021-04-10 MED ORDER — ASPIRIN EC 81 MG PO TBEC: 81.0000 mg | DELAYED_RELEASE_TABLET | Freq: Every day | ORAL | 3 refills | Status: DC

## 2021-04-10 MED ORDER — EZETIMIBE 10 MG PO TABS: 10.0000 mg | ORAL_TABLET | Freq: Every day | ORAL | 3 refills | Status: DC

## 2021-04-10 NOTE — Patient Instructions (Signed)
Medication Instructions:   STOP PLAVIX  START ASPIRIN 81 MG ONCE DAILY  START EZETIMIBE 10 MG ONCE DAILY  *If you need a refill on your cardiac medications before your next appointment, please call your pharmacy*   Lab Work: Your physician recommends that you return for lab work in: [place]   If you have labs (blood work) drawn today and your tests are completely normal, you will receive your results only by: [place] (if you have MyChart) OR A paper copy in the mail If you have any lab test that is abnormal or we need to change your treatment, we will call you to review the results.   Follow-Up: At [facility], you and your health needs are our priority.  As part of our continuing mission to provide you with exceptional heart care, we have created designated Provider Care Teams.  These Care Teams include your primary Cardiologist (physician) and Advanced Practice Providers (APPs -  Physician Assistants and Nurse Practitioners) who all work together to provide you with the care you need, when you need it.  We recommend signing up for the patient portal called "MyChart".  Sign up information is provided on this After Visit Summary.  MyChart is used to connect with patients for Virtual Visits (Telemedicine).  Patients are able to view lab/test results, encounter notes, upcoming appointments, etc.  Non-urgent messages can be sent to your provider as well.   To learn more about what you can do with MyChart, go to [email or web address].    Your next appointment:   6 month(s)  The format for your next appointment:   In Person  Provider:   Kirk Ruths, MD

## 2021-04-10 NOTE — Progress Notes (Signed)
HPI: FU CP.  Patient does have a history of peripheral vascular disease and has had prior angioplasty and stent of her left common iliac artery and right common iliac artery.  Left subclavian was occluded.  December 2021 showed no pulmonary embolus, left thyroid nodule, emphysema and aortic atherosclerosis. Carotid dopplers 8/22 showed 40-59 right and 1-39 left. CTA Nuclear study 8/22 showed EF 76 with normal perfusion. ABIs 8/22 normal. Had left carotid subclavian bypass 12/22. Since last seen,   Current Outpatient Medications  Medication Sig Dispense Refill   acetaminophen (TYLENOL) 500 MG tablet Take 1,000 mg by mouth every 6 (six) hours as needed for headache.     albuterol (VENTOLIN HFA) 108 (90 Base) MCG/ACT inhaler Inhale 1-2 puffs into the lungs every 4 (four) hours as needed for wheezing or shortness of breath. 1 each 2   amLODipine (NORVASC) 5 MG tablet Take 5 mg by mouth at bedtime.     atorvastatin (LIPITOR) 80 MG tablet Take 1 tablet (80 mg total) by mouth at bedtime. 90 tablet 3   Cholecalciferol (VITAMIN D3 PO) Take 1 tablet by mouth in the morning.     clopidogrel (PLAVIX) 75 MG tablet Take 1 tablet (75 mg total) by mouth daily. NO REFILLS. NEEDS TO TRANSFER CARE TO NEW PCP. 30 tablet 0   losartan (COZAAR) 100 MG tablet Take 1 tablet (100 mg total) by mouth daily. (Patient taking differently: Take 100 mg by mouth at bedtime.) 90 tablet 3   omeprazole (PRILOSEC) 40 MG capsule Take 1 capsule (40 mg total) by mouth daily. 90 capsule 3   promethazine (PHENERGAN) 12.5 MG tablet Take 1 tablet (12.5 mg total) by mouth every 4 (four) hours as needed for nausea or vomiting. 30 tablet 1   senna (SENOKOT) 8.6 MG tablet Take 2 tablets (17.2 mg total) by mouth daily. As needed for constipation (Patient taking differently: Take 2 tablets by mouth at bedtime.) 90 tablet 1   vitamin B-12 (CYANOCOBALAMIN) 1000 MCG tablet Take 1,000 mcg by mouth daily.      No current facility-administered  medications for this visit.     Past Medical History:  Diagnosis Date   Anxiety    Basilar artery aneurysm (HCC)    s/p stent supported coil embolization of basilar aneurysm 12/02/20   Dyspnea    Emphysematous COPD ([place]) 04/23/2017   GERD (gastroesophageal reflux disease)    Hyperlipidemia    Hypertension    Peripheral arterial disease (HCC)    Thyroid nodule 04/23/2017    Past Surgical History:  Procedure Laterality Date   ABDOMINAL AORTOGRAM N/A 07/16/2017   Procedure: ABDOMINAL AORTOGRAM;  Surgeon: Serafina Mitchell, MD;  Location: [place] [facility];  Service: Cardiovascular;  Laterality: N/A;   ABDOMINAL AORTOGRAM W/LOWER EXTREMITY Bilateral 12/02/2018   Procedure: ABDOMINAL AORTOGRAM W/LOWER EXTREMITY;  Surgeon: Serafina Mitchell, MD;  Location: [place] [facility];  Service: Cardiovascular;  Laterality: Bilateral;   AORTIC ARCH ANGIOGRAPHY N/A 12/02/2018   Procedure: AORTIC ARCH ANGIOGRAPHY;  Surgeon: Serafina Mitchell, MD;  Location: [place] [facility];  Service: Cardiovascular;  Laterality: N/A;   basilaraneurysm repair  11/2020   CAROTID-SUBCLAVIAN BYPASS GRAFT Left 02/22/2021   Procedure: LEFT CAROTID-SUBCLAVIAN BYPASS USING HEAMSHIELD GOLD 6MM X30MM;  Surgeon: Serafina Mitchell, MD;  Location: [place];  Service: Vascular;  Laterality: Left;   ENDOMETRIAL BIOPSY  2005   FEMORAL ARTERY STENT Bilateral 2008   IR 3D INDEPENDENT WKST  12/02/2020   IR  ANGIO INTRA EXTRACRAN SEL INTERNAL CAROTID UNI R MOD SED  11/08/2020   IR ANGIO VERTEBRAL SEL SUBCLAVIAN INNOMINATE UNI R MOD SED  11/08/2020   IR ANGIO VERTEBRAL SEL VERTEBRAL BILAT MOD SED  12/02/2020   IR ANGIOGRAM FOLLOW UP STUDY  12/02/2020   IR ANGIOGRAM FOLLOW UP STUDY  12/02/2020   IR ANGIOGRAM FOLLOW UP STUDY  12/02/2020   IR ANGIOGRAM FOLLOW UP STUDY  12/02/2020   IR TRANSCATH/EMBOLIZ  12/02/2020   IR US GUIDE VASC ACCESS RIGHT  11/08/2020   IR US GUIDE VASC ACCESS RIGHT  12/02/2020   LOWER EXTREMITY ANGIOGRAPHY  Bilateral 07/16/2017   Procedure: Lower Extremity Angiography;  Surgeon: Serafina Mitchell, MD;  Location: [place] [facility];  Service: Cardiovascular;  Laterality: Bilateral;   NASAL SINUS SURGERY     PERIPHERAL VASCULAR BALLOON ANGIOPLASTY Bilateral 07/16/2017   Procedure: PERIPHERAL VASCULAR BALLOON ANGIOPLASTY;  Surgeon: Serafina Mitchell, MD;  Location: [place] [facility] [place] [facility];  Service: Cardiovascular;  Laterality: Bilateral;  iliacs   PERIPHERAL VASCULAR INTERVENTION Bilateral 12/02/2018   Procedure: PERIPHERAL VASCULAR INTERVENTION;  Surgeon: Serafina Mitchell, MD;  Location: [place] [facility];  Service: Cardiovascular;  Laterality: Bilateral;   RADIOLOGY WITH ANESTHESIA N/A 12/02/2020   Procedure: Arteriogram, Surpass embolization of basilar aneurysm;  Surgeon: Consuella Lose, MD;  Location: [place];  Service: Radiology;  Laterality: N/A;   TONSILLECTOMY     TUBAL LIGATION     VASCULAR SURGERY      Social History   Socioeconomic History   Marital status: Married    Spouse name: Not on file   Number of children: 1   Years of education: Not on file   Highest education level: Not on file  Occupational History   Not on file  Tobacco Use   Smoking status: Former    Packs/day: 0.50    Years: 35.00    Pack years: 17.50    Types: Cigarettes    Quit date: 12/18/2017    Years since quitting: 3.3   Smokeless tobacco: Never   Tobacco comments:    As per pt - smoking 1/2 pk daily  Vaping Use   Vaping Use: Never used  Substance and Sexual Activity   Alcohol use: No   Drug use: No   Sexual activity: Yes  Other Topics Concern   Not on file  Social History Narrative   Not on file   Social Determinants of Health   Financial Resource Strain: Not on file  Food Insecurity: Not on file  Transportation Needs: Not on file  Physical Activity: Not on file  Stress: Not on file  Social Connections: Not on file  Intimate Partner Violence: Not on file    Family History  Problem  Relation Age of Onset   Hypertension Mother    Hypertension Father    Heart disease Father    Peripheral vascular disease Father    Alcohol abuse Father    Fibroids Sister    Cancer Maternal Grandmother        BREAST   Stroke Maternal Grandmother        CEBERAL ATHEROSCLEROSIS   Cancer - Ovarian Cousin     ROS: no fevers or chills, productive cough, hemoptysis, dysphasia, odynophagia, melena, hematochezia, dysuria, hematuria, rash, seizure activity, orthopnea, PND, pedal edema, claudication. Remaining systems are negative.  Physical Exam: Well-developed well-nourished in no acute distress.  Skin is warm and dry.  HEENT is normal.  Neck is supple.  Chest is clear  to auscultation with normal expansion.  Cardiovascular exam is regular rate and rhythm.  Abdominal exam nontender or distended. No masses palpated. Extremities show no edema. neuro grossly intact  ECG- personally reviewed  A/P  1 chest pain-no recurrences; nuclear study negative.   2 carotid artery disease-followed by vascular surgery.     3 peripheral vascular disease-followed by vascular surgery.   4 hyperlipidemia-continue lipitor.   5 hypertension-blood pressure controlled; continue present meds and follow.  Kirk Ruths, MD

## 2021-04-10 NOTE — Progress Notes (Signed)
HPI: FU CP.  Patient does have a history of peripheral vascular disease and has had prior angioplasty and stent of her left common iliac artery and right common iliac artery.  Left subclavian was occluded.  December 2021 showed no pulmonary embolus, left thyroid nodule, emphysema and aortic atherosclerosis. Carotid dopplers 8/22 showed 40-59 right and 1-39 left. Nuclear study 8/22 showed EF 76 with normal perfusion. ABIs 8/22 normal. Had left carotid subclavian bypass 12/22. Since last seen, no CP or syncope; occasional fatigue and DOE.  Current Outpatient Medications  Medication Sig Dispense Refill   acetaminophen (TYLENOL) 500 MG tablet Take 1,000 mg by mouth every 6 (six) hours as needed for headache.     albuterol (VENTOLIN HFA) 108 (90 Base) MCG/ACT inhaler Inhale 1-2 puffs into the lungs every 4 (four) hours as needed for wheezing or shortness of breath. 1 each 2   amLODipine (NORVASC) 5 MG tablet Take 5 mg by mouth at bedtime.     atorvastatin (LIPITOR) 80 MG tablet Take 1 tablet (80 mg total) by mouth at bedtime. 90 tablet 3   Cholecalciferol (VITAMIN D3 PO) Take 1 tablet by mouth in the morning.     losartan (COZAAR) 100 MG tablet Take 1 tablet (100 mg total) by mouth daily. (Patient taking differently: Take 100 mg by mouth at bedtime.) 90 tablet 3   omeprazole (PRILOSEC) 40 MG capsule Take 1 capsule (40 mg total) by mouth daily. 90 capsule 3   promethazine (PHENERGAN) 12.5 MG tablet Take 1 tablet (12.5 mg total) by mouth every 4 (four) hours as needed for nausea or vomiting. 30 tablet 1   senna (SENOKOT) 8.6 MG tablet Take 2 tablets (17.2 mg total) by mouth daily. As needed for constipation (Patient taking differently: Take 2 tablets by mouth at bedtime.) 90 tablet 1   vitamin B-12 (CYANOCOBALAMIN) 1000 MCG tablet Take 1,000 mcg by mouth daily.      No current facility-administered medications for this visit.     Past Medical History:  Diagnosis Date   Anxiety     Basilar artery aneurysm (HCC)    s/p stent supported coil embolization of basilar aneurysm 12/02/20   Dyspnea    Emphysematous COPD ([place]) 04/23/2017   GERD (gastroesophageal reflux disease)    Hyperlipidemia    Hypertension    Peripheral arterial disease (HCC)    Thyroid nodule 04/23/2017    Past Surgical History:  Procedure Laterality Date   ABDOMINAL AORTOGRAM N/A 07/16/2017   Procedure: ABDOMINAL AORTOGRAM;  Surgeon: Serafina Mitchell, MD;  Location: [place] [facility];  Service: Cardiovascular;  Laterality: N/A;   ABDOMINAL AORTOGRAM W/LOWER EXTREMITY Bilateral 12/02/2018   Procedure: ABDOMINAL AORTOGRAM W/LOWER EXTREMITY;  Surgeon: Serafina Mitchell, MD;  Location: [place] [facility];  Service: Cardiovascular;  Laterality: Bilateral;   AORTIC ARCH ANGIOGRAPHY N/A 12/02/2018   Procedure: AORTIC ARCH ANGIOGRAPHY;  Surgeon: Serafina Mitchell, MD;  Location: [place] [facility];  Service: Cardiovascular;  Laterality: N/A;   basilaraneurysm repair  11/2020   CAROTID-SUBCLAVIAN BYPASS GRAFT Left 02/22/2021   Procedure: LEFT CAROTID-SUBCLAVIAN BYPASS USING HEAMSHIELD GOLD 6MM X30MM;  Surgeon: Serafina Mitchell, MD;  Location: [facility];  Service: Vascular;  Laterality: Left;   ENDOMETRIAL BIOPSY  2005   FEMORAL ARTERY STENT Bilateral 2008   IR 3D INDEPENDENT WKST  12/02/2020   IR ANGIO INTRA EXTRACRAN SEL INTERNAL CAROTID UNI R MOD SED  11/08/2020   IR ANGIO VERTEBRAL SEL SUBCLAVIAN INNOMINATE UNI R  MOD SED  11/08/2020   IR ANGIO VERTEBRAL SEL VERTEBRAL BILAT MOD SED  12/02/2020   IR ANGIOGRAM FOLLOW UP STUDY  12/02/2020   IR ANGIOGRAM FOLLOW UP STUDY  12/02/2020   IR ANGIOGRAM FOLLOW UP STUDY  12/02/2020   IR ANGIOGRAM FOLLOW UP STUDY  12/02/2020   IR TRANSCATH/EMBOLIZ  12/02/2020   IR US GUIDE VASC ACCESS RIGHT  11/08/2020   IR US GUIDE VASC ACCESS RIGHT  12/02/2020   LOWER EXTREMITY ANGIOGRAPHY Bilateral 07/16/2017   Procedure: Lower Extremity Angiography;   Surgeon: Serafina Mitchell, MD;  Location: [place] [facility];  Service: Cardiovascular;  Laterality: Bilateral;   NASAL SINUS SURGERY     PERIPHERAL VASCULAR BALLOON ANGIOPLASTY Bilateral 07/16/2017   Procedure: PERIPHERAL VASCULAR BALLOON ANGIOPLASTY;  Surgeon: Serafina Mitchell, MD;  Location: [place] [facility];  Service: Cardiovascular;  Laterality: Bilateral;  iliacs   PERIPHERAL VASCULAR INTERVENTION Bilateral 12/02/2018   Procedure: PERIPHERAL VASCULAR INTERVENTION;  Surgeon: Serafina Mitchell, MD;  Location: [place] [facility];  Service: Cardiovascular;  Laterality: Bilateral;   RADIOLOGY WITH ANESTHESIA N/A 12/02/2020   Procedure: Arteriogram, Surpass embolization of basilar aneurysm;  Surgeon: Consuella Lose, MD;  Location: [place];  Service: Radiology;  Laterality: N/A;   TONSILLECTOMY     TUBAL LIGATION     VASCULAR SURGERY      Social History   Socioeconomic History   Marital status: Married    Spouse name: Not on file   Number of children: 1   Years of education: Not on file   Highest education level: Not on file  Occupational History   Not on file  Tobacco Use   Smoking status: Former    Packs/day: 0.50    Years: 35.00    Pack years: 17.50    Types: Cigarettes    Quit date: 12/18/2017    Years since quitting: 3.3   Smokeless tobacco: Never   Tobacco comments:    As per pt - smoking 1/2 pk daily  Vaping Use   Vaping Use: Never used  Substance and Sexual Activity   Alcohol use: No   Drug use: No   Sexual activity: Yes  Other Topics Concern   Not on file  Social History Narrative   Not on file   Social Determinants of Health   Financial Resource Strain: Not on file  Food Insecurity: Not on file  Transportation Needs: Not on file  Physical Activity: Not on file  Stress: Not on file  Social Connections: Not on file  Intimate Partner Violence: Not on file    Family History  Problem Relation Age of Onset   Hypertension Mother     Hypertension Father    Heart disease Father    Peripheral vascular disease Father    Alcohol abuse Father    Fibroids Sister    Cancer Maternal Grandmother        BREAST   Stroke Maternal Grandmother        CEBERAL ATHEROSCLEROSIS   Cancer - Ovarian Cousin     ROS: no fevers or chills, productive cough, hemoptysis, dysphasia, odynophagia, melena, hematochezia, dysuria, hematuria, rash, seizure activity, orthopnea, PND, pedal edema, claudication. Remaining systems are negative.  Physical Exam: Well-developed well-nourished in no acute distress.  Skin is warm and dry.  HEENT is normal.  Neck is supple. Bilateral bruits Chest is clear to auscultation with normal expansion.  Cardiovascular exam is regular rate and rhythm.  Abdominal exam nontender or distended. No masses  palpated. Extremities show no edema. neuro grossly intact  A/P  1 chest pain-no recurrences; nuclear study negative. Will follow.   2 carotid artery disease-followed by vascular surgery.     3 peripheral vascular disease-followed by vascular surgery.   4 hyperlipidemia-continue lipitor. Last LDL 83; add zetia 10 mg daily; lipids and liver 8 weeks.   5 hypertension-blood pressure controlled; continue present meds and follow.  Kirk Ruths, MD

## 2021-04-18 ENCOUNTER — Emergency Department
Admission: RE | Admit: 2021-04-18 | Discharge: 2021-04-18 | Disposition: A | Discharge status: Home / Self Care | Payer: Federal, State, Local not specified - PPO | Source: Ambulatory Visit

## 2021-04-18 ENCOUNTER — Other Ambulatory Visit: Payer: Self-pay

## 2021-04-18 VITALS — BP 139/82 | HR 90 | Temp 98.9°F | Resp 17 | Ht 65.0 in | Wt 175.0 lb

## 2021-04-18 DIAGNOSIS — H9201 Otalgia, right ear: Secondary | ICD-10-CM | POA: Diagnosis not present

## 2021-04-18 DIAGNOSIS — H9311 Tinnitus, right ear: Secondary | ICD-10-CM | POA: Diagnosis not present

## 2021-04-18 DIAGNOSIS — J029 Acute pharyngitis, unspecified: Secondary | ICD-10-CM

## 2021-04-18 LAB — POCT RAPID STREP A (OFFICE): Rapid Strep A Screen: NEGATIVE

## 2021-04-18 MED ORDER — FLUTICASONE PROPIONATE 50 MCG/ACT NA SUSP: 1.0000 | Freq: Every day | NASAL | 0 refills | Status: DC

## 2021-04-18 MED ORDER — PREDNISONE 50 MG PO TABS: 50.0000 mg | ORAL_TABLET | Freq: Every day | ORAL | 0 refills | Status: DC

## 2021-04-18 NOTE — Discharge Instructions (Addendum)
Start prednisone daily.  Use allergy medication including Flonase, antihistamine, Mucinex for symptom relief.  You should not take NSAIDs with prednisone including aspirin, ibuprofen/Advil, naproxen/Aleve.  If your symptoms or not improving please follow-up with ENT as we discussed.  If you develop any severe symptoms including severe sore throat, difficulty swallowing, difficulty breathing, swelling of your throat, muffled voice, hearing loss you should be seen immediately.

## 2021-04-18 NOTE — ED Triage Notes (Signed)
Sore throat w/ bilateral ear pain (ringing per pt )  Denies fever OTC - sudafed & zyrtec

## 2021-04-18 NOTE — ED Provider Notes (Signed)
[facility]    CSN: [id number] Arrival date & time: 04/18/21  1651      History   Chief Complaint Chief Complaint  Patient presents with   Sore Throat    HPI Tina Navarro is a 61 y.o. female.   Patient presents today with a 4-day history of sore throat and bilateral ear pain that is worse on the right.  She reports pain is rated 4 on a 0-10 pain scale, localized to right ear and throat, worse with swallowing, no alleviating factors identified.  She denies any change in hearing.  She does report new onset of tinnitus.  She denies any recent barotrauma, airplane travel, recent swimming.  Denies any known sick contacts.  Denies any significant congestion, fever, cough, nausea, vomiting.  She is eating and drinking despite symptoms.  She has tried Zyrtec and Sudafed without improvement of symptoms.  She does not take ototoxic medications including aspirin on a regular basis.   Past Medical History:  Diagnosis Date   Anxiety    Basilar artery aneurysm (HCC)    s/p stent supported coil embolization of basilar aneurysm 12/02/20   Dyspnea    Emphysematous COPD ([place]) 04/23/2017   GERD (gastroesophageal reflux disease)    Hyperlipidemia    Hypertension    Peripheral arterial disease ([place])    Thyroid nodule 04/23/2017    Patient Active Problem List   Diagnosis Date Noted   Left subclavian artery occlusion 02/22/2021   Aneurysm, cerebral, nonruptured 12/02/2020   Anxiety state 12/25/2017   Irritant contact dermatitis due to drug in contact with skin 12/25/2017   Thyroid nodule 04/23/2017   Emphysematous COPD ([place]) 04/23/2017   Rib pain on left side 04/23/2017   Neuropathy involving both lower extremities 04/22/2017   Pleuritic chest pain 04/22/2017   Essential hypertension 05/01/2016   Abnormal findings on esophagogastroduodenoscopy (EGD) 03/18/2016   BPPV (benign paroxysmal positional vertigo) 06/28/2015   Irritable bowel syndrome 04/01/2015   Dyslipidemia, goal  LDL below 70 04/01/2015   Current tobacco use 07/28/2012   Multinodular goiter 12/19/2011   CN (constipation) 07/28/2011   Dyslipidemia 07/28/2011   Acid reflux 09/25/2006   Peripheral vascular disease ([place]) 09/25/2006    Past Surgical History:  Procedure Laterality Date   ABDOMINAL AORTOGRAM N/A 07/16/2017   Procedure: ABDOMINAL AORTOGRAM;  Surgeon: Serafina Mitchell, MD;  Location: [facility];  Service: Cardiovascular;  Laterality: N/A;   ABDOMINAL AORTOGRAM W/LOWER EXTREMITY Bilateral 12/02/2018   Procedure: ABDOMINAL AORTOGRAM W/LOWER EXTREMITY;  Surgeon: Serafina Mitchell, MD;  Location: [facility];  Service: Cardiovascular;  Laterality: Bilateral;   AORTIC ARCH ANGIOGRAPHY N/A 12/02/2018   Procedure: AORTIC ARCH ANGIOGRAPHY;  Surgeon: Serafina Mitchell, MD;  Location: [facility];  Service: Cardiovascular;  Laterality: N/A;   basilaraneurysm repair  11/2020   CAROTID-SUBCLAVIAN BYPASS GRAFT Left 02/22/2021   Procedure: LEFT CAROTID-SUBCLAVIAN BYPASS USING HEAMSHIELD GOLD 6MM X30MM;  Surgeon: Serafina Mitchell, MD;  Location: [facility] OR;  Service: Vascular;  Laterality: Left;   ENDOMETRIAL BIOPSY  2005   FEMORAL ARTERY STENT Bilateral 2008   IR 3D INDEPENDENT WKST  12/02/2020   IR ANGIO INTRA EXTRACRAN SEL INTERNAL CAROTID UNI R MOD SED  11/08/2020   IR ANGIO VERTEBRAL SEL SUBCLAVIAN INNOMINATE UNI R MOD SED  11/08/2020   IR ANGIO VERTEBRAL SEL VERTEBRAL BILAT MOD SED  12/02/2020   IR ANGIOGRAM FOLLOW UP STUDY  12/02/2020   IR ANGIOGRAM FOLLOW UP STUDY  12/02/2020  IR ANGIOGRAM FOLLOW UP STUDY  12/02/2020   IR ANGIOGRAM FOLLOW UP STUDY  12/02/2020   IR TRANSCATH/EMBOLIZ  12/02/2020   IR US GUIDE VASC ACCESS RIGHT  11/08/2020   IR US GUIDE VASC ACCESS RIGHT  12/02/2020   LOWER EXTREMITY ANGIOGRAPHY Bilateral 07/16/2017   Procedure: Lower Extremity Angiography;  Surgeon: Nada LibmanBrabham, Vance W, MD;  Location: [facility];  Service: Cardiovascular;  Laterality:  Bilateral;   NASAL SINUS SURGERY     PERIPHERAL VASCULAR BALLOON ANGIOPLASTY Bilateral 07/16/2017   Procedure: PERIPHERAL VASCULAR BALLOON ANGIOPLASTY;  Surgeon: Nada LibmanBrabham, Vance W, MD;  Location: [facility];  Service: Cardiovascular;  Laterality: Bilateral;  iliacs   PERIPHERAL VASCULAR INTERVENTION Bilateral 12/02/2018   Procedure: PERIPHERAL VASCULAR INTERVENTION;  Surgeon: Nada LibmanBrabham, Vance W, MD;  Location: [facility];  Service: Cardiovascular;  Laterality: Bilateral;   RADIOLOGY WITH ANESTHESIA N/A 12/02/2020   Procedure: Arteriogram, Surpass embolization of basilar aneurysm;  Surgeon: Lisbeth RenshawNundkumar, Neelesh, MD;  [facility];  Service: Radiology;  Laterality: N/A;   TONSILLECTOMY     TUBAL LIGATION     VASCULAR SURGERY      OB History   No obstetric history on file.      Home Medications    Prior to Admission medications   Medication Sig Start Date End Date Taking? Authorizing Provider  fluticasone (FLONASE) 50 MCG/ACT nasal spray Place 1 spray into both nostrils daily. 04/18/21  Yes Rawson Minix, Noberto RetortErin K, PA-C  predniSONE (DELTASONE) 50 MG tablet Take 1 tablet (50 mg total) by mouth daily with breakfast. 04/18/21  Yes Dezmon Conover K, PA-C  acetaminophen (TYLENOL) 500 MG tablet Take 1,000 mg by mouth every 6 (six) hours as needed for headache.    [provider]  albuterol (VENTOLIN HFA) 108 (90 Base) MCG/ACT inhaler Inhale 1-2 puffs into the lungs every 4 (four) hours as needed for wheezing or shortness of breath. 09/01/20   Sunnie NielsenAlexander, Natalie, DO  amLODipine (NORVASC) 5 MG tablet Take 5 mg by mouth at bedtime.    [provider]  aspirin EC 81 MG tablet Take 1 tablet (81 mg total) by mouth daily. Swallow whole. 04/10/21   Lewayne Buntingrenshaw, Brian S, MD  atorvastatin (LIPITOR) 80 MG tablet Take 1 tablet (80 mg total) by mouth at bedtime. 09/07/20   Lewayne Buntingrenshaw, Brian S, MD  Cholecalciferol (VITAMIN D3 PO) Take 1 tablet by mouth in the morning.    [provider]   ezetimibe (ZETIA) 10 MG tablet Take 1 tablet (10 mg total) by mouth daily. 04/10/21 07/09/21  Lewayne Buntingrenshaw, Brian S, MD  losartan (COZAAR) 100 MG tablet Take 1 tablet (100 mg total) by mouth daily. Patient taking differently: Take 100 mg by mouth at bedtime. 09/01/20   Sunnie NielsenAlexander, Natalie, DO  omeprazole (PRILOSEC) 40 MG capsule Take 1 capsule (40 mg total) by mouth daily. 09/01/20   Sunnie NielsenAlexander, Natalie, DO  promethazine (PHENERGAN) 12.5 MG tablet Take 1 tablet (12.5 mg total) by mouth every 4 (four) hours as needed for nausea or vomiting. 12/04/20   Dawley, Troy C, DO  senna (SENOKOT) 8.6 MG tablet Take 2 tablets (17.2 mg total) by mouth daily. As needed for constipation Patient taking differently: Take 2 tablets by mouth at bedtime. 04/29/17   Sunnie NielsenAlexander, Natalie, DO  vitamin B-12 (CYANOCOBALAMIN) 1000 MCG tablet Take 1,000 mcg by mouth daily.     [provider]    Family History Family History  Problem Relation Age of Onset   Hypertension Mother  Hypertension Father    Heart disease Father    Peripheral vascular disease Father    Alcohol abuse Father    Fibroids Sister    Cancer Maternal Grandmother        BREAST   Stroke Maternal Grandmother        CEBERAL ATHEROSCLEROSIS   Cancer - Ovarian Cousin     Social History Social History   Tobacco Use   Smoking status: Former    Packs/day: 0.50    Years: 35.00    Pack years: 17.50    Types: Cigarettes    Quit date: 12/18/2017    Years since quitting: 3.3   Smokeless tobacco: Never   Tobacco comments:    As per pt - smoking 1/2 pk daily  Vaping Use   Vaping Use: Never used  Substance Use Topics   Alcohol use: No   Drug use: No     Allergies   Doxycycline   Review of Systems Review of Systems  Constitutional:  Positive for activity change. Negative for appetite change, fatigue and fever.  HENT:  Positive for ear pain, sore throat and tinnitus. Negative for congestion, ear discharge, hearing loss, sinus pressure and  sneezing.   Respiratory:  Negative for cough and shortness of breath.   Cardiovascular:  Negative for chest pain.  Gastrointestinal:  Negative for abdominal pain, diarrhea, nausea and vomiting.  Neurological:  Negative for dizziness, light-headedness and headaches.    Physical Exam Triage Vital Signs ED Triage Vitals  Enc Vitals Group     BP 04/18/21 1705 134/83     Pulse Rate 04/18/21 1705 90     Resp 04/18/21 1705 17     Temp 04/18/21 1705 98.9 F (37.2 C)     Temp Source 04/18/21 1705 Oral     SpO2 04/18/21 1705 98 %     Weight 04/18/21 1707 175 lb (79.4 kg)     Height 04/18/21 1707 5\' 5"  (1.651 m)     Head Circumference --      Peak Flow --      Pain Score 04/18/21 1706 2     Pain Loc --      Pain Edu? --      Excl. in [place]? --    No data found.  Updated Vital Signs BP 139/82 (BP Location: Right Arm) Comment (BP Location): per pt request - recent occlusion - cardiology requested BP on both arma at doctor appointments   Pulse 90    Temp 98.9 F (37.2 C) (Oral)    Resp 17    Ht 5\' 5"  (1.651 m)    Wt 175 lb (79.4 kg)    LMP  (LMP Unknown)    SpO2 98%    BMI 29.12 kg/m   Visual Acuity Right Eye Distance:   Left Eye Distance:   Bilateral Distance:    Right Eye Near:   Left Eye Near:    Bilateral Near:     Physical Exam Vitals reviewed.  Constitutional:      General: She is awake. She is not in acute distress.    Appearance: Normal appearance. She is well-developed. She is not ill-appearing.     Comments: Very pleasant female appears stated age in no acute distress sitting comfortably in exam room  HENT:     Head: Normocephalic and atraumatic.     Right Ear: Ear canal and external ear normal. A middle ear effusion is present. Tympanic membrane is not erythematous or bulging.  Left Ear: Tympanic membrane, ear canal and external ear normal. Tympanic membrane is not erythematous or bulging.     Ears:     Comments: Normal finger rub test    Nose:     Right Sinus: No  maxillary sinus tenderness or frontal sinus tenderness.     Left Sinus: No maxillary sinus tenderness or frontal sinus tenderness.     Mouth/Throat:     Pharynx: Uvula midline. Posterior oropharyngeal erythema present. No oropharyngeal exudate.  Cardiovascular:     Rate and Rhythm: Normal rate and regular rhythm.     Heart sounds: Normal heart sounds, S1 normal and S2 normal. No murmur heard. Pulmonary:     Effort: Pulmonary effort is normal.     Breath sounds: Normal breath sounds. No wheezing, rhonchi or rales.     Comments: Clear to auscultation bilaterally Psychiatric:        Behavior: Behavior is cooperative.     UC Treatments / Results  Labs (all labs ordered are listed, but only abnormal results are displayed) Labs Reviewed  CULTURE, GROUP A STREP  POCT RAPID STREP A (OFFICE)    EKG   Radiology No results found.  Procedures Procedures (including critical care time)  Medications Ordered in UC Medications - No data to display  Initial Impression / Assessment and Plan / UC Course  I have reviewed the triage vital signs and the nursing notes.  Pertinent labs & imaging results that were available during my care of the patient were reviewed by me and considered in my medical decision making (see chart for details).     Viral testing for flu and COVID-19 were deferred as patient has no significant URI symptoms outside of sore throat.  Strep testing was obtained and was negative.  Throat culture is pending.  No evidence of acute infection on physical exam that would warrant initiation of antibiotics.  Concern for eustachian tube dysfunction as etiology of symptoms.  We will treat with prednisone and patient was instructed not to take NSAIDs with this medication due to risk of GI bleeding.  Recommended she continue allergy medication including Zyrtec and Flonase.  Discussed that if symptoms not improving quickly she should follow-up with ENT was given contact information for  local provider with instruction to call to schedule an appointment if needed.  Discussed alarm symptoms that warrant emergent evaluation including swelling of her throat, muffled voice, shortness of breath, dysphagia, sudden hearing loss.  Strict return precautions given to which patient expressed understanding.  She declined work excuse note.  Final Clinical Impressions(s) / UC Diagnoses   Final diagnoses:  Sore throat  Right ear pain  Tinnitus of right ear     Discharge Instructions      Start prednisone daily.  Use allergy medication including Flonase, antihistamine, Mucinex for symptom relief.  You should not take NSAIDs with prednisone including aspirin, ibuprofen/Advil, naproxen/Aleve.  If your symptoms or not improving please follow-up with ENT as we discussed.  If you develop any severe symptoms including severe sore throat, difficulty swallowing, difficulty breathing, swelling of your throat, muffled voice, hearing loss you should be seen immediately.     ED Prescriptions     Medication Sig Dispense Auth. Provider   predniSONE (DELTASONE) 50 MG tablet Take 1 tablet (50 mg total) by mouth daily with breakfast. 4 tablet Christiaan Strebeck K, PA-C   fluticasone (FLONASE) 50 MCG/ACT nasal spray Place 1 spray into both nostrils daily. 16 g Zylpha Poynor K, PA-C  PDMP not reviewed this encounter.   Terrilee Croak, PA-C 04/18/21 1759

## 2021-04-21 LAB — CULTURE, GROUP A STREP: Strep A Culture: NEGATIVE

## 2021-04-24 ENCOUNTER — Encounter: Payer: Self-pay | Admitting: Family Medicine

## 2021-04-24 ENCOUNTER — Ambulatory Visit: Payer: Federal, State, Local not specified - PPO | Admitting: Family Medicine

## 2021-04-24 ENCOUNTER — Other Ambulatory Visit: Payer: Self-pay

## 2021-04-24 VITALS — BP 119/78 | HR 92 | Wt 177.1 lb

## 2021-04-24 DIAGNOSIS — J101 Influenza due to other identified influenza virus with other respiratory manifestations: Secondary | ICD-10-CM | POA: Insufficient documentation

## 2021-04-24 DIAGNOSIS — J069 Acute upper respiratory infection, unspecified: Secondary | ICD-10-CM

## 2021-04-24 LAB — POCT INFLUENZA A/B
Influenza A, POC: NEGATIVE
Influenza B, POC: POSITIVE — AB

## 2021-04-24 MED ORDER — HYDROCOD POLI-CHLORPHE POLI ER 10-8 MG/5ML PO SUER: 5.0000 mL | Freq: Two times a day (BID) | ORAL | 0 refills | Status: DC | PRN

## 2021-04-24 NOTE — Assessment & Plan Note (Signed)
Rapid flu testing positive for influenza B.  Recommend continued supportive care.  No indication for antibiotics at this time as she does not exhibit any signs or symptoms of bacterial infection.  She is out of the therapeutic window for Tamiflu.  We will add Tussionex cough syrup.  Recommend continued supportive care with over-the-counter analgesics, increase fluids and over-the-counter medications for supportive care.  Instructed to contact clinic if symptoms worsen.

## 2021-04-24 NOTE — Patient Instructions (Signed)
Viral Respiratory Infection A respiratory infection is an illness that affects part of the respiratory system, such as the lungs, nose, or throat. A respiratory infection that is caused by a virus is called a viral respiratory infection. Common types of viral respiratory infections include: A cold. The flu (influenza). A respiratory syncytial virus (RSV) infection. What are the causes? This condition is caused by a virus. The virus may spread through contact with droplets or direct contact with infected people or their mucus or secretions. The virus may spread from person to person (is contagious). What are the signs or symptoms? Symptoms of this condition include: A stuffy or runny nose. A sore throat or cough. Shortness of breath or difficulty breathing. Yellow or green mucus (sputum). Other symptoms may include: A fever. Sweating or chills. Fatigue. Achy muscles. A headache. How is this diagnosed? This condition may be diagnosed based on: Your symptoms. A physical exam. Testing of secretions from the nose or throat. Chest X-ray. How is this treated? This condition may be treated with medicines, such as: Antiviral medicine. This may shorten the length of time a person has symptoms. Expectorants. These make it easier to cough up mucus. Decongestant nasal sprays. Acetaminophen or NSAIDs, such as ibuprofen, to relieve fever and pain. Antibiotic medicines are not prescribed for viral infections.This is because antibiotics are designed to kill bacteria. They do not kill viruses. Follow these instructions at home: Managing pain and congestion Take over-the-counter and prescription medicines only as told by your health care provider. If you have a sore throat, gargle with a mixture of salt and water 3-4 times a day or as needed. To make salt water, completely dissolve -1 tsp (3-6 g) of salt in 1 cup (237 mL) of warm water. Use nose drops made from salt water to ease congestion and  soften raw skin around your nose. Take 2 tsp (10 mL) of honey at bedtime to lessen coughing at night. Do not give honey to children who are younger than 1 year. Drink enough fluid to keep your urine pale yellow. This helps prevent dehydration and helps loosen up mucus. General instructions  Rest as much as possible. Do not drink alcohol. Do not use any products that contain nicotine or tobacco. These products include cigarettes, chewing tobacco, and vaping devices, such as e-cigarettes. If you need help quitting, ask your health care provider. Keep all follow-up visits. This is important. How is this prevented?   Get an annual flu shot. You may get the flu shot in late summer, fall, or winter. Ask your health care provider when you should get your flu shot. Avoid spreading your infection to other people. If you are sick: Wash your hands with soap and water often, especially after you cough or sneeze. Wash for at least 20 seconds. If soap and water are not available, use alcohol-based hand sanitizer. Cover your mouth when you cough. Cover your nose and mouth when you sneeze. Do not share cups or eating utensils. Clean commonly used objects often. Clean commonly touched surfaces. Stay home from work or school as told by your health care provider. Avoid contact with people who are sick during cold and flu season. This is generally fall and winter. Contact a health care provider if: Your symptoms last for 10 days or longer. Your symptoms get worse over time. You have severe sinus pain in your face or forehead. The glands in your jaw or neck become very swollen. You have shortness of breath. Get help right   away if you: Feel pain or pressure in your chest. Have trouble breathing. Faint or feel like you will faint. Have severe and persistent vomiting. Feel confused or disoriented. These symptoms may represent a serious problem that is an emergency. Do not wait to see if the symptoms will go  away. Get medical help right away. Call your local emergency services (911 in the [place] Do not drive yourself to the hospital. Summary A respiratory infection is an illness that affects part of the respiratory system, such as the lungs, nose, or throat. A respiratory infection that is caused by a virus is called a viral respiratory infection. Common types of viral respiratory infections include a cold, influenza, and respiratory syncytial virus (RSV) infection. Symptoms of this condition include a stuffy or runny nose, cough, fatigue, achy muscles, sore throat, and fevers or chills. Antibiotic medicines are not prescribed for viral infections. This is because antibiotics are designed to kill bacteria. They are not effective against viruses. This information is not intended to replace advice given to you by your health care provider. Make sure you discuss any questions you have with your health care provider. Document Revised: 05/19/2020 Document Reviewed: 05/19/2020 Elsevier Patient Education  2022 [employer]  

## 2021-04-24 NOTE — Progress Notes (Signed)
Tina Navarro - 61 y.o. female MRN [id number]  Date of birth: 05-Jan-1961  Subjective No chief complaint on file.   HPI Con she was initially seen at urgent care and nie is a 61 year old female here today with complaint of cough, congestion, sinus pressure, sore throat and fatigue.  Symptoms started approximately 10 days ago.  Initially seen in urgent care and had strep test which was negative.  Prescribed prednisone but did not have much improvement with this.  She has used Sudafed and Tylenol over-the-counter with some improvement.  She denies fever but has had some chills.  She denies dyspnea.  ROS:  A comprehensive ROS was completed and negative except as noted per HPI  Allergies  Allergen Reactions   Doxycycline Hives, Rash and Other (See Comments)    Pain, photosensitive    Past Medical History:  Diagnosis Date   Anxiety    Basilar artery aneurysm (HCC)    s/p stent supported coil embolization of basilar aneurysm 12/02/20   Dyspnea    Emphysematous COPD (HCC) 04/23/2017   GERD (gastroesophageal reflux disease)    Hyperlipidemia    Hypertension    Peripheral arterial disease (HCC)    Thyroid nodule 04/23/2017    Past Surgical History:  Procedure Laterality Date   ABDOMINAL AORTOGRAM N/A 07/16/2017   Procedure: ABDOMINAL AORTOGRAM;  Surgeon: Nada Libman, MD;  Location: [facility];  Service: Cardiovascular;  Laterality: N/A;   ABDOMINAL AORTOGRAM W/LOWER EXTREMITY Bilateral 12/02/2018   Procedure: ABDOMINAL AORTOGRAM W/LOWER EXTREMITY;  Surgeon: Nada Libman, MD;  Location: [facility];  Service: Cardiovascular;  Laterality: Bilateral;   AORTIC ARCH ANGIOGRAPHY N/A 12/02/2018   Procedure: AORTIC ARCH ANGIOGRAPHY;  Surgeon: Nada Libman, MD;  Location: [facility];  Service: Cardiovascular;  Laterality: N/A;   basilaraneurysm repair  11/2020   CAROTID-SUBCLAVIAN BYPASS GRAFT Left 02/22/2021   Procedure: LEFT CAROTID-SUBCLAVIAN BYPASS USING  HEAMSHIELD GOLD X30MM;  Surgeon: Nada Libman, MD;  Location: [facility] OR;  Service: Vascular;  Laterality: Left;   ENDOMETRIAL BIOPSY  2005   FEMORAL ARTERY STENT Bilateral 2008   IR 3D INDEPENDENT WKST  12/02/2020   IR ANGIO INTRA EXTRACRAN SEL INTERNAL CAROTID UNI R MOD SED  11/08/2020   IR ANGIO VERTEBRAL SEL SUBCLAVIAN INNOMINATE UNI R MOD SED  11/08/2020   IR ANGIO VERTEBRAL SEL VERTEBRAL BILAT MOD SED  12/02/2020   IR ANGIOGRAM FOLLOW UP STUDY  12/02/2020   IR ANGIOGRAM FOLLOW UP STUDY  12/02/2020   IR ANGIOGRAM FOLLOW UP STUDY  12/02/2020   IR ANGIOGRAM FOLLOW UP STUDY  12/02/2020   IR TRANSCATH/EMBOLIZ  12/02/2020   IR US GUIDE VASC ACCESS RIGHT  11/08/2020   IR US GUIDE VASC ACCESS RIGHT  12/02/2020   LOWER EXTREMITY ANGIOGRAPHY Bilateral 07/16/2017   Procedure: Lower Extremity Angiography;  Surgeon: Nada Libman, MD;  Location: [facility];  Service: Cardiovascular;  Laterality: Bilateral;   NASAL SINUS SURGERY     PERIPHERAL VASCULAR BALLOON ANGIOPLASTY Bilateral 07/16/2017   Procedure: PERIPHERAL VASCULAR BALLOON ANGIOPLASTY;  Surgeon: Nada Libman, MD;  Location: [facility];  Service: Cardiovascular;  Laterality: Bilateral;  iliacs   PERIPHERAL VASCULAR INTERVENTION Bilateral 12/02/2018   Procedure: PERIPHERAL VASCULAR INTERVENTION;  Surgeon: Nada Libman, MD;  Location: [facility];  Service: Cardiovascular;  Laterality: Bilateral;   RADIOLOGY WITH ANESTHESIA N/A 12/02/2020   Procedure: Arteriogram, Surpass embolization of basilar aneurysm;  Surgeon: Lisbeth Renshaw, MD;  Location: [facility];  Service: Radiology;  Laterality: N/A;   TONSILLECTOMY     TUBAL LIGATION     VASCULAR SURGERY      Social History   Socioeconomic History   Marital status: Married    Spouse name: Not on file   Number of children: 1   Years of education: Not on file   Highest education level: Not on file  Occupational History   Not on file  Tobacco Use    Smoking status: Former    Packs/day: 0.50    Years: 35.00    Pack years: 17.50    Types: Cigarettes    Quit date: 12/18/2017    Years since quitting: 3.3   Smokeless tobacco: Never   Tobacco comments:    As per pt - smoking 1/2 pk daily  Vaping Use   Vaping Use: Never used  Substance and Sexual Activity   Alcohol use: No   Drug use: No   Sexual activity: Yes  Other Topics Concern   Not on file  Social History Narrative   Not on file   Social Determinants of Health   Financial Resource Strain: Not on file  Food Insecurity: Not on file  Transportation Needs: Not on file  Physical Activity: Not on file  Stress: Not on file  Social Connections: Not on file    Family History  Problem Relation Age of Onset   Hypertension Mother    Hypertension Father    Heart disease Father    Peripheral vascular disease Father    Alcohol abuse Father    Fibroids Sister    Cancer Maternal Grandmother        BREAST   Stroke Maternal Grandmother        CEBERAL ATHEROSCLEROSIS   Cancer - Ovarian Cousin     Health Maintenance  Topic Date Due   Zoster Vaccines- Shingrix (1 of 2) Never done   PAP SMEAR-Modifier  03/23/2012   MAMMOGRAM  11/05/2014   TETANUS/TDAP  12/12/2015   COVID-19 Vaccine (4 - Booster for Moderna series) 03/21/2020   INFLUENZA VACCINE  09/26/2020   COLONOSCOPY (Pts 45-63yrs Insurance coverage will need to be confirmed)  01/16/2022   Hepatitis C Screening  Completed   HIV Screening  Completed   HPV VACCINES  Aged Out     ----------------------------------------------------------------------------------------------------------------------------------------------------------------------------------------------------------------- Physical Exam BP 119/78    Pulse 92    Wt 177 lb 1.9 oz (80.3 kg)    LMP  (LMP Unknown)    SpO2 99%    BMI 29.47 kg/m   Physical Exam Constitutional:      Appearance: Normal appearance.  Eyes:     General: No scleral  icterus. Cardiovascular:     Rate and Rhythm: Regular rhythm. Tachycardia present.  Pulmonary:     Effort: Pulmonary effort is normal.     Breath sounds: Normal breath sounds.  Musculoskeletal:     Cervical back: Neck supple.  Neurological:     General: No focal deficit present.     Mental Status: She is alert.  Psychiatric:        Mood and Affect: Mood normal.        Behavior: Behavior normal.    ------------------------------------------------------------------------------------------------------------------------------------------------------------------------------------------------------------------- Assessment and Plan  Influenza B Rapid flu testing positive for influenza B.  Recommend continued supportive care.  No indication for antibiotics at this time as she does not exhibit any signs or symptoms of bacterial infection.  She is out of the therapeutic window for Tamiflu.  We will add Tussionex cough syrup.  Recommend continued supportive care with over-the-counter analgesics, increase fluids and over-the-counter medications for supportive care.  Instructed to contact clinic if symptoms worsen.   Meds ordered this encounter  Medications   chlorpheniramine-HYDROcodone (TUSSIONEX PENNKINETIC ER) 10-8 MG/5ML    Sig: Take 5 mLs by mouth every 12 (twelve) hours as needed for cough.    Dispense:  130 mL    Refill:  0    No follow-ups on file.    This visit occurred during the SARS-CoV-2 public health emergency.  Safety protocols were in place, including screening questions prior to the visit, additional usage of staff PPE, and extensive cleaning of exam room while observing appropriate contact time as indicated for disinfecting solutions.

## 2021-04-26 ENCOUNTER — Encounter: Payer: Self-pay | Admitting: Family Medicine

## 2021-05-01 ENCOUNTER — Encounter: Payer: Self-pay | Admitting: Family Medicine

## 2021-05-01 ENCOUNTER — Ambulatory Visit: Payer: Federal, State, Local not specified - PPO | Admitting: Family Medicine

## 2021-05-01 ENCOUNTER — Other Ambulatory Visit: Payer: Self-pay

## 2021-05-01 VITALS — BP 123/59 | HR 97 | Temp 98.0°F | Ht 65.0 in | Wt 176.0 lb

## 2021-05-01 DIAGNOSIS — J329 Chronic sinusitis, unspecified: Secondary | ICD-10-CM

## 2021-05-01 DIAGNOSIS — J40 Bronchitis, not specified as acute or chronic: Secondary | ICD-10-CM

## 2021-05-01 MED ORDER — HYDROCOD POLI-CHLORPHE POLI ER 10-8 MG/5ML PO SUER: 5.0000 mL | Freq: Every evening | ORAL | 0 refills | Status: DC | PRN

## 2021-05-01 MED ORDER — AZITHROMYCIN 250 MG PO TABS: ORAL_TABLET | ORAL | 0 refills | Status: AC

## 2021-05-01 NOTE — Progress Notes (Signed)
Acute Office Visit  Subjective:    Patient ID: Tina Navarro, female    DOB: 04/14/1960, 61 y.o.   MRN: [id number]  Chief Complaint  Patient presents with   Follow-up         HPI Patient is in today for chest tightness and SOB x 3 days and coughing up brown mucous. Taking Mucinex, Sudafed.  Tight around her lower chest from front to back.   Originally seen February 21 at urgent care with a sore throat and some right ear pain.  She then followed up with Dr. Ashley Royalty about 6 days later on February 27 and was diagnosed with influenza.  She has had some drainage and cough since then.  But in the last several days she is actually felt like she is gotten worse she feels extremely tired and exhausted.  She is also short of breath which she was not previously experiencing she has had a persistent headache that is mostly frontal that will not go away and she has been using Tylenol.  Her the ear pain did resolve.  She also feels like there is a tightness starting at the lower portion of her chest and wrapping around to her back.  Past Medical History:  Diagnosis Date   Anxiety    Basilar artery aneurysm (HCC)    s/p stent supported coil embolization of basilar aneurysm 12/02/20   Dyspnea    Emphysematous COPD (HCC) 04/23/2017   GERD (gastroesophageal reflux disease)    Hyperlipidemia    Hypertension    Peripheral arterial disease (HCC)    Thyroid nodule 04/23/2017    Past Surgical History:  Procedure Laterality Date   ABDOMINAL AORTOGRAM N/A 07/16/2017   Procedure: ABDOMINAL AORTOGRAM;  Surgeon: Nada Libman, MD;  [facility]: [facility];  Service: Cardiovascular;  Laterality: N/A;   ABDOMINAL AORTOGRAM W/LOWER EXTREMITY Bilateral 12/02/2018   Procedure: ABDOMINAL AORTOGRAM W/LOWER EXTREMITY;  Surgeon: Nada Libman, MD;  [facility]: [facility];  Service: Cardiovascular;  Laterality: Bilateral;   AORTIC ARCH ANGIOGRAPHY N/A 12/02/2018   Procedure: AORTIC ARCH  ANGIOGRAPHY;  Surgeon: Nada Libman, MD;  [facility];  Service: Cardiovascular;  Laterality: N/A;   basilaraneurysm repair  11/2020   CAROTID-SUBCLAVIAN BYPASS GRAFT Left 02/22/2021   Procedure: LEFT CAROTID-SUBCLAVIAN BYPASS USING HEAMSHIELD GOLD X30MM;  Surgeon: Nada Libman, MD;  Location: [facility] OR;  Service: Vascular;  Laterality: Left;   ENDOMETRIAL BIOPSY  2005   FEMORAL ARTERY STENT Bilateral 2008   IR 3D INDEPENDENT WKST  12/02/2020   IR ANGIO INTRA EXTRACRAN SEL INTERNAL CAROTID UNI R MOD SED  11/08/2020   IR ANGIO VERTEBRAL SEL SUBCLAVIAN INNOMINATE UNI R MOD SED  11/08/2020   IR ANGIO VERTEBRAL SEL VERTEBRAL BILAT MOD SED  12/02/2020   IR ANGIOGRAM FOLLOW UP STUDY  12/02/2020   IR ANGIOGRAM FOLLOW UP STUDY  12/02/2020   IR ANGIOGRAM FOLLOW UP STUDY  12/02/2020   IR ANGIOGRAM FOLLOW UP STUDY  12/02/2020   IR TRANSCATH/EMBOLIZ  12/02/2020   IR US GUIDE VASC ACCESS RIGHT  11/08/2020   IR US GUIDE VASC ACCESS RIGHT  12/02/2020   LOWER EXTREMITY ANGIOGRAPHY Bilateral 07/16/2017   Procedure: Lower Extremity Angiography;  Surgeon: Nada Libman, MD;  [facility]: [facility];  Service: Cardiovascular;  Laterality: Bilateral;   NASAL SINUS SURGERY     PERIPHERAL VASCULAR BALLOON ANGIOPLASTY Bilateral 07/16/2017   Procedure: PERIPHERAL VASCULAR BALLOON ANGIOPLASTY;  Surgeon: Nada Libman,  MD;  Location: [facility];  Service: Cardiovascular;  Laterality: Bilateral;  iliacs   PERIPHERAL VASCULAR INTERVENTION Bilateral 12/02/2018   Procedure: PERIPHERAL VASCULAR INTERVENTION;  Surgeon: Nada LibmanBrabham, Vance W, MD;  Location: [facility];  Service: Cardiovascular;  Laterality: Bilateral;   RADIOLOGY WITH ANESTHESIA N/A 12/02/2020   Procedure: Arteriogram, Surpass embolization of basilar aneurysm;  Surgeon: Lisbeth RenshawNundkumar, Neelesh, MD;  [facility];  Service: Radiology;  Laterality: N/A;   TONSILLECTOMY     TUBAL LIGATION     VASCULAR SURGERY       Family History  Problem Relation Age of Onset   Hypertension Mother    Hypertension Father    Heart disease Father    Peripheral vascular disease Father    Alcohol abuse Father    Fibroids Sister    Cancer Maternal Grandmother        BREAST   Stroke Maternal Grandmother        CEBERAL ATHEROSCLEROSIS   Cancer - Ovarian Cousin     Social History   Socioeconomic History   Marital status: Married    Spouse name: Not on file   Number of children: 1   Years of education: Not on file   Highest education level: Not on file  Occupational History   Not on file  Tobacco Use   Smoking status: Former    Packs/day: 0.50    Years: 35.00    Pack years: 17.50    Types: Cigarettes    Quit date: 12/18/2017    Years since quitting: 3.3   Smokeless tobacco: Never   Tobacco comments:    As per pt - smoking 1/2 pk daily  Vaping Use   Vaping Use: Never used  Substance and Sexual Activity   Alcohol use: No   Drug use: No   Sexual activity: Yes  Other Topics Concern   Not on file  Social History Narrative   Not on file   Social Determinants of Health   Financial Resource Strain: Not on file  Food Insecurity: Not on file  Transportation Needs: Not on file  Physical Activity: Not on file  Stress: Not on file  Social Connections: Not on file  Intimate Partner Violence: Not on file    Outpatient Medications Prior to Visit  Medication Sig Dispense Refill   acetaminophen (TYLENOL) 500 MG tablet Take 1,000 mg by mouth every 6 (six) hours as needed for headache.     albuterol (VENTOLIN HFA) 108 (90 Base) MCG/ACT inhaler Inhale 1-2 puffs into the lungs every 4 (four) hours as needed for wheezing or shortness of breath. 1 each 2   amLODipine (NORVASC) 5 MG tablet Take 5 mg by mouth at bedtime.     aspirin EC 81 MG tablet Take 1 tablet (81 mg total) by mouth daily. Swallow whole. 90 tablet 3   atorvastatin (LIPITOR) 80 MG tablet Take 1 tablet (80 mg total) by mouth at bedtime. 90  tablet 3   Cholecalciferol (VITAMIN D3 PO) Take 1 tablet by mouth in the morning.     ezetimibe (ZETIA) 10 MG tablet Take 1 tablet (10 mg total) by mouth daily. 90 tablet 3   fluticasone (FLONASE) 50 MCG/ACT nasal spray Place 1 spray into both nostrils daily. 16 g 0   losartan (COZAAR) 100 MG tablet Take 1 tablet (100 mg total) by mouth daily. (Patient taking differently: Take 100 mg by mouth at bedtime.) 90 tablet 3   omeprazole (PRILOSEC) 40 MG capsule Take 1  capsule (40 mg total) by mouth daily. 90 capsule 3   promethazine (PHENERGAN) 12.5 MG tablet Take 1 tablet (12.5 mg total) by mouth every 4 (four) hours as needed for nausea or vomiting. 30 tablet 1   senna (SENOKOT) 8.6 MG tablet Take 2 tablets (17.2 mg total) by mouth daily. As needed for constipation (Patient taking differently: Take 2 tablets by mouth at bedtime.) 90 tablet 1   vitamin B-12 (CYANOCOBALAMIN) 1000 MCG tablet Take 1,000 mcg by mouth daily.      chlorpheniramine-HYDROcodone (TUSSIONEX PENNKINETIC ER) 10-8 MG/5ML Take 5 mLs by mouth every 12 (twelve) hours as needed for cough. 130 mL 0   No facility-administered medications prior to visit.    Allergies  Allergen Reactions   Doxycycline Hives, Rash and Other (See Comments)    Pain, photosensitive    Review of Systems     Objective:    Physical Exam Constitutional:      Appearance: She is well-developed.  HENT:     Head: Normocephalic and atraumatic.     Right Ear: External ear normal.     Left Ear: External ear normal.     Nose: Nose normal.  Eyes:     Conjunctiva/sclera: Conjunctivae normal.     Pupils: Pupils are equal, round, and reactive to light.  Neck:     Thyroid: No thyromegaly.  Cardiovascular:     Rate and Rhythm: Normal rate and regular rhythm.     Heart sounds: Normal heart sounds.  Pulmonary:     Effort: Pulmonary effort is normal.     Breath sounds: No wheezing.     Comments: Rhonchi in the left upper lobe Musculoskeletal:     Cervical  back: Neck supple.  Lymphadenopathy:     Cervical: No cervical adenopathy.  Skin:    General: Skin is warm and dry.  Neurological:     Mental Status: She is alert and oriented to person, place, and time.    BP (!) 123/59    Pulse 97    Temp 98 F (36.7 C)    Ht 5\' 5"  (1.651 m)    Wt 176 lb (79.8 kg)    LMP  (LMP Unknown)    SpO2 99%    BMI 29.29 kg/m  Wt Readings from Last 3 Encounters:  05/01/21 176 lb (79.8 kg)  04/24/21 177 lb 1.9 oz (80.3 kg)  04/18/21 175 lb (79.4 kg)    Health Maintenance Due  Topic Date Due   Zoster Vaccines- Shingrix (1 of 2) Never done   PAP SMEAR-Modifier  03/23/2012   MAMMOGRAM  11/05/2014   TETANUS/TDAP  12/12/2015   COVID-19 Vaccine (4 - Booster for Moderna series) 03/21/2020   INFLUENZA VACCINE  09/26/2020    There are no preventive care reminders to display for this patient.   Lab Results  Component Value Date   TSH 2.58 09/02/2020   Lab Results  Component Value Date   WBC 7.6 02/25/2021   HGB 10.1 (L) 02/25/2021   HCT 31.8 (L) 02/25/2021   MCV 78.7 (L) 02/25/2021   PLT 298 02/25/2021   Lab Results  Component Value Date   NA 140 02/25/2021   K 3.3 (L) 02/25/2021   CO2 25 02/25/2021   GLUCOSE 103 (H) 02/25/2021   BUN 15 02/25/2021   CREATININE 0.86 02/25/2021   BILITOT 0.2 (L) 02/21/2021   ALKPHOS 109 02/21/2021   AST 27 02/21/2021   ALT 21 02/21/2021   PROT 6.0 (L) 02/21/2021  ALBUMIN 3.3 (L) 02/21/2021   CALCIUM 8.4 (L) 02/25/2021   ANIONGAP 9 02/25/2021   Lab Results  Component Value Date   CHOL 146 02/23/2021   Lab Results  Component Value Date   HDL 39 (L) 02/23/2021   Lab Results  Component Value Date   LDLCALC 83 02/23/2021   Lab Results  Component Value Date   TRIG 121 02/23/2021   Lab Results  Component Value Date   CHOLHDL 3.7 02/23/2021   Lab Results  Component Value Date   HGBA1C 5.3 09/03/2018       Assessment & Plan:   Problem List Items Addressed This Visit   None Visit Diagnoses      Sinobronchitis    -  Primary   Relevant Medications   azithromycin (ZITHROMAX) 250 MG tablet   chlorpheniramine-HYDROcodone (TUSSIONEX PENNKINETIC ER) 10-8 MG/5ML      Make sure to complete the antibiotic.  Did refill the cough syrup.  If not better by the end of the week then please give [place] a call back we will plan to get a chest x-ray.   Meds ordered this encounter  Medications   azithromycin (ZITHROMAX) 250 MG tablet    Sig: 2 Ttabs PO on Day 1, then one a day x 4 days.    Dispense:  6 tablet    Refill:  0   chlorpheniramine-HYDROcodone (TUSSIONEX PENNKINETIC ER) 10-8 MG/5ML    Sig: Take 5 mLs by mouth at bedtime as needed for cough.    Dispense:  70 mL    Refill:  0     Nani Gasser, MD

## 2021-05-12 ENCOUNTER — Other Ambulatory Visit: Payer: Self-pay | Admitting: Neurosurgery

## 2021-05-12 DIAGNOSIS — I671 Cerebral aneurysm, nonruptured: Secondary | ICD-10-CM

## 2021-05-25 ENCOUNTER — Other Ambulatory Visit: Payer: Self-pay | Admitting: Neurosurgery

## 2021-06-16 ENCOUNTER — Other Ambulatory Visit: Payer: Self-pay

## 2021-06-16 ENCOUNTER — Other Ambulatory Visit: Payer: Self-pay | Admitting: Neurosurgery

## 2021-06-16 ENCOUNTER — Ambulatory Visit (HOSPITAL_COMMUNITY)
Admission: RE | Admit: 2021-06-16 | Discharge: 2021-06-16 | Disposition: A | Discharge status: Home / Self Care | Payer: Federal, State, Local not specified - PPO | Source: Ambulatory Visit | Attending: Neurosurgery | Admitting: Neurosurgery

## 2021-06-16 DIAGNOSIS — I671 Cerebral aneurysm, nonruptured: Secondary | ICD-10-CM | POA: Insufficient documentation

## 2021-06-16 DIAGNOSIS — I725 Aneurysm of other precerebral arteries: Secondary | ICD-10-CM | POA: Diagnosis not present

## 2021-06-16 DIAGNOSIS — Z87891 Personal history of nicotine dependence: Secondary | ICD-10-CM | POA: Insufficient documentation

## 2021-06-16 DIAGNOSIS — Z7982 Long term (current) use of aspirin: Secondary | ICD-10-CM | POA: Diagnosis not present

## 2021-06-16 DIAGNOSIS — I739 Peripheral vascular disease, unspecified: Secondary | ICD-10-CM | POA: Insufficient documentation

## 2021-06-16 HISTORY — PX: IR ANGIO VERTEBRAL SEL VERTEBRAL UNI R MOD SED: IMG5368

## 2021-06-16 HISTORY — PX: IR US GUIDE VASC ACCESS RIGHT: IMG2390

## 2021-06-16 LAB — BASIC METABOLIC PANEL
Anion gap: 9 (ref 5–15)
BUN: 15 mg/dL (ref 6–20)
CO2: 23 mmol/L (ref 22–32)
Calcium: 9.1 mg/dL (ref 8.9–10.3)
Chloride: 109 mmol/L (ref 98–111)
Creatinine, Ser: 0.79 mg/dL (ref 0.44–1.00)
GFR, Estimated: >60 mL/min (ref >60)
Glucose, Bld: 114 mg/dL — ABNORMAL HIGH (ref 70–99)
Potassium: 3.5 mmol/L (ref 3.5–5.1)
Sodium: 141 mmol/L (ref 135–145)

## 2021-06-16 LAB — CBC WITH DIFFERENTIAL/PLATELET
Abs Immature Granulocytes: 0.04 10*3/uL (ref 0.00–0.07)
Basophils Absolute: 0.1 10*3/uL (ref 0.0–0.1)
Basophils Relative: 1 %
Eosinophils Absolute: 0.5 10*3/uL (ref 0.0–0.5)
Eosinophils Relative: 5 %
HCT: 36.4 % (ref 36.0–46.0)
Hemoglobin: 11.4 g/dL — ABNORMAL LOW (ref 12.0–15.0)
Immature Granulocytes: 0 %
Lymphocytes Relative: 21 %
Lymphs Abs: 2 10*3/uL (ref 0.7–4.0)
MCH: 24.7 pg — ABNORMAL LOW (ref 26.0–34.0)
MCHC: 31.3 g/dL (ref 30.0–36.0)
MCV: 79 fL — ABNORMAL LOW (ref 80.0–100.0)
Monocytes Absolute: 0.6 10*3/uL (ref 0.1–1.0)
Monocytes Relative: 7 %
Neutro Abs: 6.4 10*3/uL (ref 1.7–7.7)
Neutrophils Relative %: 66 %
Platelets: 388 10*3/uL (ref 150–400)
RBC: 4.61 MIL/uL (ref 3.87–5.11)
RDW: 16.2 % — ABNORMAL HIGH (ref 11.5–15.5)
WBC: 9.5 10*3/uL (ref 4.0–10.5)
nRBC: 0 % (ref 0.0–0.2)

## 2021-06-16 LAB — APTT: aPTT: 26 seconds (ref 24–36)

## 2021-06-16 LAB — URINALYSIS, ROUTINE W REFLEX MICROSCOPIC
Bilirubin Urine: NEGATIVE
Glucose, UA: NEGATIVE mg/dL
Hgb urine dipstick: NEGATIVE
Ketones, ur: NEGATIVE mg/dL
Nitrite: NEGATIVE
Protein, ur: NEGATIVE mg/dL
Specific Gravity, Urine: 1.021 (ref 1.005–1.030)
pH: 5 (ref 5.0–8.0)

## 2021-06-16 LAB — PROTIME-INR
INR: 0.9 (ref 0.8–1.2)
Prothrombin Time: 12.5 seconds (ref 11.4–15.2)

## 2021-06-16 MED ORDER — VERAPAMIL HCL 2.5 MG/ML IV SOLN
INTRAVENOUS | Status: AC
  Filled 2021-06-16: qty 2

## 2021-06-16 MED ORDER — CHLORHEXIDINE GLUCONATE CLOTH 2 % EX PADS: 6.0000 | MEDICATED_PAD | Freq: Once | CUTANEOUS | Status: DC

## 2021-06-16 MED ORDER — FENTANYL CITRATE (PF) 100 MCG/2ML IJ SOLN
INTRAMUSCULAR | Status: AC
  Filled 2021-06-16: qty 2

## 2021-06-16 MED ORDER — CEFAZOLIN SODIUM-DEXTROSE 2-4 GM/100ML-% IV SOLN: 2.0000 g | INTRAVENOUS | Status: DC

## 2021-06-16 MED ORDER — NITROGLYCERIN 1 MG/10 ML FOR IR/CATH LAB
INTRA_ARTERIAL | Status: AC
Start: 2021-06-16 — End: 2021-06-16
  Filled 2021-06-16: qty 10

## 2021-06-16 MED ORDER — HEPARIN SODIUM (PORCINE) 1000 UNIT/ML IJ SOLN
INTRAMUSCULAR | Status: AC | PRN
  Administered 2021-06-16: 2000 [IU] via INTRAVENOUS

## 2021-06-16 MED ORDER — LIDOCAINE HCL 1 % IJ SOLN
INTRAMUSCULAR | Status: AC
  Administered 2021-06-16: 20 mL
  Filled 2021-06-16: qty 20

## 2021-06-16 MED ORDER — HEPARIN SODIUM (PORCINE) 1000 UNIT/ML IJ SOLN
INTRAMUSCULAR | Status: AC
Start: 2021-06-16 — End: 2021-06-16
  Filled 2021-06-16: qty 10

## 2021-06-16 MED ORDER — FENTANYL CITRATE (PF) 100 MCG/2ML IJ SOLN
INTRAMUSCULAR | Status: AC | PRN
  Administered 2021-06-16 (×3): 25 ug via INTRAVENOUS

## 2021-06-16 MED ORDER — MIDAZOLAM HCL 2 MG/2ML IJ SOLN
INTRAMUSCULAR | Status: AC
  Filled 2021-06-16: qty 2

## 2021-06-16 MED ORDER — SODIUM CHLORIDE 0.9 % IV SOLN: INTRAVENOUS | Status: DC

## 2021-06-16 MED ORDER — MIDAZOLAM HCL 2 MG/2ML IJ SOLN
INTRAMUSCULAR | Status: AC | PRN
  Administered 2021-06-16: .5 mg via INTRAVENOUS
  Administered 2021-06-16: 1 mg via INTRAVENOUS

## 2021-06-16 MED ORDER — HYDROCODONE-ACETAMINOPHEN 5-325 MG PO TABS: 1.0000 | ORAL_TABLET | ORAL | Status: DC | PRN

## 2021-06-16 MED ORDER — IOHEXOL 300 MG/ML  SOLN: 100.0000 mL | Freq: Once | INTRAMUSCULAR | Status: DC | PRN

## 2021-06-16 MED ORDER — LIDOCAINE HCL 1 % IJ SOLN
INTRAMUSCULAR | Status: DC
Start: 2021-06-16 — End: 2021-06-16
  Filled 2021-06-16: qty 20

## 2021-06-16 NOTE — H&P (Signed)
?Chief Complaint  ?Aneurysm ? ?History of Present Illness  ?Tina Navarro is a 61 y.o. female with a history of peripheral vascular disease and a left vertebral stenosis.  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  She underwent stent supported coil embolization approximately 6 months ago.  She has done very well from a neurologic standpoint.  She has since undergone left subclavian bypass.  She presents today for routine short-term angiographic follow-up for her aneurysm.  She remains on baby aspirin daily. ? ?Past Medical History  ? ?Past Medical History:  ?Diagnosis Date  ? Anxiety   ? Basilar artery aneurysm [facility])   ? s/p stent supported coil embolization of basilar aneurysm 12/02/20  ? Dyspnea   ? Emphysematous COPD ([place]) 04/23/2017  ? GERD (gastroesophageal reflux disease)   ? Hyperlipidemia   ? Hypertension   ? Peripheral arterial disease ([place])   ? Thyroid nodule 04/23/2017  ? ? ?Past Surgical History  ? ?Past Surgical History:  ?Procedure Laterality Date  ? ABDOMINAL AORTOGRAM N/A 07/16/2017  ? Procedure: ABDOMINAL AORTOGRAM;  Surgeon: Serafina Mitchell, MD;  Location: [place] [facility];  Service: Cardiovascular;  Laterality: N/A;  ? ABDOMINAL AORTOGRAM W/LOWER EXTREMITY Bilateral 12/02/2018  ? Procedure: ABDOMINAL AORTOGRAM W/LOWER EXTREMITY;  Surgeon: Serafina Mitchell, MD;  Location: [place] [facility];  Service: Cardiovascular;  Laterality: Bilateral;  ? AORTIC ARCH ANGIOGRAPHY N/A 12/02/2018  ? Procedure: AORTIC ARCH ANGIOGRAPHY;  Surgeon: Serafina Mitchell, MD;  Location: [place] [facility];  Service: Cardiovascular;  Laterality: N/A;  ? basilaraneurysm repair  11/2020  ? CAROTID-SUBCLAVIAN BYPASS GRAFT Left 02/22/2021  ? Procedure: LEFT CAROTID-SUBCLAVIAN BYPASS USING HEAMSHIELD GOLD 6MM X30MM;  Surgeon: Serafina Mitchell, MD;  Location: [place];  Service: Vascular;  Laterality: Left;  ? ENDOMETRIAL BIOPSY  2005  ? FEMORAL ARTERY STENT  Bilateral 2008  ? IR 3D INDEPENDENT WKST  12/02/2020  ? IR ANGIO INTRA EXTRACRAN SEL INTERNAL CAROTID UNI R MOD SED  11/08/2020  ? IR ANGIO VERTEBRAL SEL SUBCLAVIAN INNOMINATE UNI R MOD SED  11/08/2020  ? IR ANGIO VERTEBRAL SEL VERTEBRAL BILAT MOD SED  12/02/2020  ? IR ANGIOGRAM FOLLOW UP STUDY  12/02/2020  ? IR ANGIOGRAM FOLLOW UP STUDY  12/02/2020  ? IR ANGIOGRAM FOLLOW UP STUDY  12/02/2020  ? IR ANGIOGRAM FOLLOW UP STUDY  12/02/2020  ? IR TRANSCATH/EMBOLIZ  12/02/2020  ? IR US GUIDE VASC ACCESS RIGHT  11/08/2020  ? IR US GUIDE VASC ACCESS RIGHT  12/02/2020  ? LOWER EXTREMITY ANGIOGRAPHY Bilateral 07/16/2017  ? Procedure: Lower Extremity Angiography;  Surgeon: Serafina Mitchell, MD;  Location: [place] [facility];  Service: Cardiovascular;  Laterality: Bilateral;  ? NASAL SINUS SURGERY    ? PERIPHERAL VASCULAR BALLOON ANGIOPLASTY Bilateral 07/16/2017  ? Procedure: PERIPHERAL VASCULAR BALLOON ANGIOPLASTY;  Surgeon: Serafina Mitchell, MD;  Location: [place] [facility];  Service: Cardiovascular;  Laterality: Bilateral;  iliacs  ? PERIPHERAL VASCULAR INTERVENTION Bilateral 12/02/2018  ? Procedure: PERIPHERAL VASCULAR INTERVENTION;  Surgeon: Serafina Mitchell, MD;  Location: [place] [facility];  Service: Cardiovascular;  Laterality: Bilateral;  ? RADIOLOGY WITH ANESTHESIA N/A 12/02/2020  ? Procedure: Arteriogram, Surpass embolization of basilar aneurysm;  Surgeon: Consuella Lose, MD;  Location: [place];  Service: Radiology;  Laterality: N/A;  ? TONSILLECTOMY    ? TUBAL LIGATION    ? VASCULAR SURGERY    ? ? ?Social History  ? ?Social History  ? ?  Tobacco Use  ? Smoking status: Former  ?  Packs/day: 0.50  ?  Years: 35.00  ?  Pack years: 17.50  ?  Types: Cigarettes  ?  Quit date: 12/18/2017  ?  Years since quitting: 3.4  ? Smokeless tobacco: Never  ? Tobacco comments:  ?  As per pt - smoking 1/2 pk daily  ?Vaping Use  ? Vaping Use: Never used  ?Substance Use Topics  ? Alcohol use: No  ? Drug use: No  ? ? ?Medications   ? ?Prior to Admission medications   ?Medication Sig Start Date End Date Taking? Authorizing Provider  ?acetaminophen (TYLENOL) 500 MG tablet Take 1,000 mg by mouth every 6 (six) hours as needed for headache.   Yes [provider]  ?amLODipine (NORVASC) 5 MG tablet Take 5 mg by mouth at bedtime.   Yes [provider]  ?aspirin EC 81 MG tablet Take 1 tablet (81 mg total) by mouth daily. Swallow whole. 04/10/21  Yes Lewayne Bunting, MD  ?atorvastatin (LIPITOR) 80 MG tablet Take 1 tablet (80 mg total) by mouth at bedtime. 09/07/20  Yes Lewayne Bunting, MD  ?Cholecalciferol (VITAMIN D3 PO) Take 1 tablet by mouth in the morning.   Yes [provider]  ?ezetimibe (ZETIA) 10 MG tablet Take 1 tablet (10 mg total) by mouth daily. 04/10/21 07/09/21 Yes Lewayne Bunting, MD  ?fluticasone (FLONASE) 50 MCG/ACT nasal spray Place 1 spray into both nostrils daily. 04/18/21  Yes Raspet, Erin K, PA-C  ?losartan (COZAAR) 100 MG tablet Take 1 tablet (100 mg total) by mouth daily. ?Patient taking differently: Take 100 mg by mouth at bedtime. 09/01/20  Yes Sunnie Nielsen, DO  ?omeprazole (PRILOSEC) 40 MG capsule Take 1 capsule (40 mg total) by mouth daily. 09/01/20  Yes Sunnie Nielsen, DO  ?promethazine (PHENERGAN) 12.5 MG tablet Take 1 tablet (12.5 mg total) by mouth every 4 (four) hours as needed for nausea or vomiting. 12/04/20  Yes Dawley, Alan Mulder, DO  ?senna (SENOKOT) 8.6 MG tablet Take 2 tablets (17.2 mg total) by mouth daily. As needed for constipation ?Patient taking differently: Take 2 tablets by mouth at bedtime. 04/29/17  Yes Sunnie Nielsen, DO  ?vitamin B-12 (CYANOCOBALAMIN) 1000 MCG tablet Take 1,000 mcg by mouth daily.    Yes [provider]  ?albuterol (VENTOLIN HFA) 108 (90 Base) MCG/ACT inhaler Inhale 1-2 puffs into the lungs every 4 (four) hours as needed for wheezing or shortness of breath. 09/01/20   Sunnie Nielsen, DO  ?chlorpheniramine-HYDROcodone [facility])  10-8 MG/5ML Take 5 mLs by mouth at bedtime as needed for cough. 05/01/21   Agapito Games, MD  ? ? ?Allergies  ? ?Allergies  ?Allergen Reactions  ? Doxycycline Hives, Rash and Other (See Comments)  ?  Pain, photosensitive  ? ? ?Review of Systems  ?ROS ? ?Neurologic Exam  ?Awake, alert, oriented ?Memory and concentration grossly intact ?Speech fluent, appropriate ?CN grossly intact ?Motor exam: ?Upper Extremities Deltoid Bicep Tricep Grip  ?Right 5/5 5/5 5/5 5/5  ?Left 5/5 5/5 5/5 5/5  ? ?Lower Extremities IP Quad PF DF EHL  ?Right 5/5 5/5 5/5 5/5 5/5  ?Left 5/5 5/5 5/5 5/5 5/5  ? ?Sensation grossly intact to LT ? ? ?Impression  ?- 61 y.o. female 6 months status post stent supported coil embolization of a vertebrobasilar junction aneurysm in relation to a proximal basilar fenestration.  She is doing well neurologically. ? ?Plan  ?-We will plan on proceeding with routine  short-term angiographic follow-up likely through a right radial approach. ? ?I have reviewed the details of the procedure as well as the expected postoperative course and recovery with the patient.  We have discussed the associated risks, benefits, and alternatives to the angiogram.  All her questions today were answered and she provided informed consent to proceed. ? ?Consuella Lose, MD ?[facility]  ?

## 2021-06-16 NOTE — Sedation Documentation (Signed)
5 Fr Exoseal deployed right groin, holding manual pressure x 5 min ?

## 2021-06-16 NOTE — Brief Op Note (Signed)
?  NEUROSURGERY BRIEF OPERATIVE  NOTE  ? ?PREOP DX: Aneurysm ? ?POSTOP DX: Same ? ?PROCEDURE: Diagnostic cerebral angiogram ? ?SURGEON: Dr. Consuella Lose, MD ? ?ANESTHESIA: IV Sedation with Local ? ?EBL: Minimal ? ?SPECIMENS: None ? ?COMPLICATIONS: None ? ?CONDITION: Stable to recovery ? ?FINDINGS (Full report in [facility]): ?1. Complete occlusion of vertebrobasilar aneurysm 5mo after stent-supported coiling. No in-stent stenosis. ? ? ?Consuella Lose, MD ?[facility]  ? ?

## 2021-06-16 NOTE — Sedation Documentation (Signed)
Patient transported to short stay. Page RN at the bedside to receive patient. Right wrist-micropuncture only, tegaderm in place. Right groin assessed. Site is clean,dry and intact. Soft to palpation, no hematoma noted. +2 distal pulses intact. ?

## 2021-06-16 NOTE — Sedation Documentation (Signed)
Right arm BP 135/70 ?Left leg BP 177/74 ? ?Patient has history of left subclavian stenosis with intervention and bilaterally lower iliac stents ?

## 2021-06-26 ENCOUNTER — Encounter: Payer: Self-pay | Admitting: *Deleted

## 2021-06-26 DIAGNOSIS — I671 Cerebral aneurysm, nonruptured: Secondary | ICD-10-CM | POA: Diagnosis not present

## 2021-06-26 DIAGNOSIS — Z6829 Body mass index (BMI) 29.0-29.9, adult: Secondary | ICD-10-CM | POA: Diagnosis not present

## 2021-07-03 ENCOUNTER — Encounter: Payer: Self-pay | Admitting: Family Medicine

## 2021-07-03 DIAGNOSIS — J40 Bronchitis, not specified as acute or chronic: Secondary | ICD-10-CM

## 2021-07-03 DIAGNOSIS — R051 Acute cough: Secondary | ICD-10-CM

## 2021-07-04 NOTE — Progress Notes (Unsigned)
I think she was Dr. Roberts Gaudy and I saw her last time.  Ordered chest xray.   ?

## 2021-07-05 ENCOUNTER — Ambulatory Visit (INDEPENDENT_AMBULATORY_CARE_PROVIDER_SITE_OTHER): Payer: Federal, State, Local not specified - PPO

## 2021-07-05 DIAGNOSIS — R051 Acute cough: Secondary | ICD-10-CM

## 2021-07-05 DIAGNOSIS — R059 Cough, unspecified: Secondary | ICD-10-CM

## 2021-07-05 DIAGNOSIS — J329 Chronic sinusitis, unspecified: Secondary | ICD-10-CM

## 2021-07-05 DIAGNOSIS — E7800 Pure hypercholesterolemia, unspecified: Secondary | ICD-10-CM | POA: Diagnosis not present

## 2021-07-06 ENCOUNTER — Other Ambulatory Visit: Payer: Self-pay | Admitting: *Deleted

## 2021-07-06 DIAGNOSIS — E7800 Pure hypercholesterolemia, unspecified: Secondary | ICD-10-CM

## 2021-07-06 LAB — LIPID PANEL
Cholesterol: 126 mg/dL (ref <200)
HDL: 42 mg/dL — ABNORMAL LOW (ref >=50)
LDL Cholesterol (Calc): 49 mg/dL (calc)
Non-HDL Cholesterol (Calc): 84 mg/dL (calc) (ref <130)
Total CHOL/HDL Ratio: 3 (calc) (ref <5.0)
Triglycerides: 332 mg/dL — ABNORMAL HIGH (ref <150)

## 2021-07-06 LAB — HEPATIC FUNCTION PANEL
AG Ratio: 1.9 (calc) (ref 1.0–2.5)
ALT: 15 U/L (ref 6–29)
AST: 21 U/L (ref 10–35)
Albumin: 4 g/dL (ref 3.6–5.1)
Alkaline phosphatase (APISO): 114 U/L (ref 37–153)
Bilirubin, Direct: 0.1 mg/dL (ref 0.0–0.2)
Globulin: 2.1 g/dL (calc) (ref 1.9–3.7)
Indirect Bilirubin: 0.2 mg/dL (calc) (ref 0.2–1.2)
Total Bilirubin: 0.3 mg/dL (ref 0.2–1.2)
Total Protein: 6.1 g/dL (ref 6.1–8.1)

## 2021-07-06 NOTE — Progress Notes (Signed)
Tina Navarro, the lungs look better aerated compared to prior chest x-ray back in December.  No acute findings.

## 2021-07-11 ENCOUNTER — Encounter: Payer: Self-pay | Admitting: Family Medicine

## 2021-07-11 ENCOUNTER — Ambulatory Visit: Payer: Federal, State, Local not specified - PPO | Admitting: Family Medicine

## 2021-07-11 VITALS — BP 122/63 | HR 92 | Ht 65.0 in | Wt 177.0 lb

## 2021-07-11 DIAGNOSIS — R0602 Shortness of breath: Secondary | ICD-10-CM | POA: Diagnosis not present

## 2021-07-11 DIAGNOSIS — I10 Essential (primary) hypertension: Secondary | ICD-10-CM

## 2021-07-11 DIAGNOSIS — R1013 Epigastric pain: Secondary | ICD-10-CM | POA: Diagnosis not present

## 2021-07-11 NOTE — Progress Notes (Signed)
Established Patient Office Visit  Subjective   Patient ID: Tina Navarro, female    DOB: [birth year]  Age: 61 y.o. MRN: [id number]  Chief Complaint  Patient presents with   Shortness of Breath    Pt reports that she experiences SOB mostly at night. She said that it is on and off she has a hard time catching her breath. Denies any F/S/C. Hard for her to take deep breaths.     HPI  Tina Navarro is reporting persistent difficulty with shortness of breath.  She was seen in the emergency department back in February for sore throat and ear pain.  And diagnosed with a respiratory infection at the end of February.  I then saw her at the beginning of March for sinobronchitis which followed COVID.  Since then she is still experiencing shortness of breath.  It seems to be mostly at night especially when she lays down.  But it is on and off its not necessarily every day.  It is hard to take a deep breath when it happens she does not necessarily experience chest pain but she does occasionally get some epigastric discomfort.  No fevers chills or sweats.  She is also had a little bit of discomfort in her left back and left anterior chest area.  She noticed the episodes maybe 3-4 times a week.  She has been under some increased stress recently as well.  No prior history of asthma.  She does have emphysematous COPD listed on her problem list from 2019.  It sounds like at one point she did see pulmonology.  I was able to find a pulmonary visit back in 2016 so about 7 years ago.  With pulmonology at [facility].  She was a smoker at that time and FEV1 was 64%.  The time she was given Combivent inhaler.  He was also on CPAP set to AutoPap range of 5-17 with an AHI of 10.   GERD-she sees Dr. Jason Fila, GI.  She does occasionally take her PPI twice a day but not regularly.    ROS    Objective:     BP 122/63   Pulse 92   Ht 5\' 5"  (1.651 m)   Wt 177 lb (80.3 kg)   LMP  (LMP Unknown)   SpO2 98%   BMI 29.45 kg/m     Physical Exam Constitutional:      Appearance: She is well-developed.  HENT:     Head: Normocephalic and atraumatic.     Right Ear: External ear normal.     Left Ear: External ear normal.     Nose: Nose normal.  Eyes:     Conjunctiva/sclera: Conjunctivae normal.     Pupils: Pupils are equal, round, and reactive to light.  Neck:     Thyroid: No thyromegaly.  Cardiovascular:     Rate and Rhythm: Normal rate and regular rhythm.     Heart sounds: Normal heart sounds.  Pulmonary:     Effort: Pulmonary effort is normal.     Breath sounds: Normal breath sounds. No wheezing.     Comments: Rhonchi in the right upper lobs Musculoskeletal:     Cervical back: Neck supple.  Lymphadenopathy:     Cervical: No cervical adenopathy.  Skin:    General: Skin is warm and dry.  Neurological:     Mental Status: She is alert and oriented to person, place, and time.     No results found for any visits on 07/11/21.  The ASCVD Risk score (Arnett DK, et al., 2019) failed to calculate for the following reasons:   The valid total cholesterol range is 130 to 320 mg/dL    Assessment & Plan:   Problem List Items Addressed This Visit       Cardiovascular and Mediastinum   Essential hypertension    Pressure looks fantastic today.       Other Visit Diagnoses     SOB (shortness of breath)    -  Primary   Relevant Orders   CT Chest Wo Contrast   Epigastric pain          Shortness of breath-I think at this point it is warranted for further work-up.  We did do a chest x-ray when she had initially called and said she was not feeling better.  No worrisome findings.  Interestingly she reports that they have previously noted a pulmonary nodule but did not comment on at this time.  I would like to get a CT of the chest for further work-up.  Also scheduling her for spirometry to see how well her lungs are working.  Also discussed other potential causes such as GERD and postnasal drip.  He does  take omeprazole so to cut discussed taking that consistently daily or even twice a day if needed to see if it is helpful over the next couple of weeks.  she did have an exercise stress test back in 2011.  She also reports that she had an echocardiogram at 1 point but I was unable to find it in the records.  Epigastric pain-could be an indication of gastritis or increased GERD.  Again trial of taking her PPI twice a day for couple of weeks might be helpful to see if symptoms improve.  Return in about 17 days (around 07/28/2021) for Spirometry .    Nani Gasser, MD

## 2021-07-13 ENCOUNTER — Telehealth: Payer: Self-pay | Admitting: Family Medicine

## 2021-07-13 ENCOUNTER — Encounter: Payer: Self-pay | Admitting: Family Medicine

## 2021-07-13 MED ORDER — FLUTICASONE PROPIONATE HFA 110 MCG/ACT IN AERO: 2.0000 | INHALATION_SPRAY | Freq: Two times a day (BID) | RESPIRATORY_TRACT | 1 refills | Status: DC

## 2021-07-13 NOTE — Telephone Encounter (Signed)
Patient advised of message and verbalized understanding. Patient stated she would like to try a daily inhaler.

## 2021-07-13 NOTE — Telephone Encounter (Signed)
Please call patient: I was able to find her pulmonary notes from 2016.  It looks like around that time she was having a lot of upper respiratory infections and bronchitis and they had opted to put her on a daily controller inhaler at least for a little while.  That certainly an option now as well sometimes it can help reduce inflammation and open up the airway with breathing if that something she would like to try please let me know and I will send over an inhaler to her pharmacy.

## 2021-07-13 NOTE — Assessment & Plan Note (Signed)
Pressure looks fantastic today. 

## 2021-07-14 NOTE — Telephone Encounter (Signed)
Pt. Advised. 

## 2021-07-14 NOTE — Telephone Encounter (Signed)
Sent to pharmacy 

## 2021-07-17 ENCOUNTER — Ambulatory Visit (INDEPENDENT_AMBULATORY_CARE_PROVIDER_SITE_OTHER): Payer: Federal, State, Local not specified - PPO

## 2021-07-17 DIAGNOSIS — J432 Centrilobular emphysema: Secondary | ICD-10-CM | POA: Diagnosis not present

## 2021-07-17 DIAGNOSIS — R0602 Shortness of breath: Secondary | ICD-10-CM | POA: Diagnosis not present

## 2021-07-17 DIAGNOSIS — J449 Chronic obstructive pulmonary disease, unspecified: Secondary | ICD-10-CM | POA: Diagnosis not present

## 2021-07-18 ENCOUNTER — Encounter: Payer: Self-pay | Admitting: Surgery

## 2021-07-18 ENCOUNTER — Encounter: Payer: Self-pay | Admitting: Cardiology

## 2021-07-18 ENCOUNTER — Encounter: Payer: Self-pay | Admitting: Family Medicine

## 2021-07-18 DIAGNOSIS — Z1231 Encounter for screening mammogram for malignant neoplasm of breast: Secondary | ICD-10-CM | POA: Diagnosis not present

## 2021-07-18 NOTE — Progress Notes (Signed)
Hi Tina Navarro,  Your CT scan of your chest showed significant plaque formation in your blood vessels particularly in your aorta.  They did note the area in your left subclavian.  You also have a little area that is a little bit widened in the aorta.  Not quite an aneurysm but it is more wide than it should be in the aorta.  It also looks like you have centrilobular emphysema based on the findings.  This could contribute to some of your shortness of breath as well.  Did not see any active infection or fluid which is good.  Are you using your Flovent regularly?  If so then we could even consider changing this and see if it improves your shortness of breath and symptoms.

## 2021-08-02 ENCOUNTER — Ambulatory Visit (INDEPENDENT_AMBULATORY_CARE_PROVIDER_SITE_OTHER): Payer: Federal, State, Local not specified - PPO | Admitting: Family Medicine

## 2021-08-02 VITALS — BP 122/74 | HR 89 | Temp 98.6°F | Ht 65.0 in | Wt 176.0 lb

## 2021-08-02 DIAGNOSIS — J439 Emphysema, unspecified: Secondary | ICD-10-CM | POA: Diagnosis not present

## 2021-08-02 DIAGNOSIS — L989 Disorder of the skin and subcutaneous tissue, unspecified: Secondary | ICD-10-CM | POA: Diagnosis not present

## 2021-08-02 DIAGNOSIS — R0602 Shortness of breath: Secondary | ICD-10-CM | POA: Diagnosis not present

## 2021-08-02 DIAGNOSIS — J449 Chronic obstructive pulmonary disease, unspecified: Secondary | ICD-10-CM | POA: Diagnosis not present

## 2021-08-02 LAB — PULMONARY FUNCTION TEST

## 2021-08-02 MED ORDER — ALBUTEROL SULFATE HFA 108 (90 BASE) MCG/ACT IN AERS
2.0000 | INHALATION_SPRAY | Freq: Once | RESPIRATORY_TRACT | Status: AC
  Administered 2021-08-02: 2 via RESPIRATORY_TRACT

## 2021-08-02 MED ORDER — STIOLTO RESPIMAT 2.5-2.5 MCG/ACT IN AERS: 2.0000 | INHALATION_SPRAY | Freq: Every day | RESPIRATORY_TRACT | 3 refills | Status: DC

## 2021-08-02 NOTE — Progress Notes (Signed)
Established Patient Office Visit  Subjective   Patient ID: Tina Navarro, female    DOB: [birth year]  Age: 61 y.o. MRN: [id number]  Chief Complaint  Patient presents with   Spirometry    HPI Spirometry -here today for spirometry for experiencing shortness of breath mostly at night.  She also reports that she feels like she has a hard time catching her breath on and off.  She does feel like overall her symptoms have improved.  She also cut back on eating late in her in the evenings to reduce any potential GERD that could be triggering some of her symptoms.  She has not noticed a big difference since starting Flovent in regards to just her general breathing but again the nighttime symptoms have been better so she is not sure if maybe it has been somewhat helpful.  She also has a skin lesion that she would like to be referred to dermatology for it.  She said for about 2 weeks she had a sore on the tip of her nose.  Its been difficult to heal it is gradually getting better but she is concerned.  She also has a skin lesion on her back that she would like to be evaluated.    ROS    Objective:     BP 122/74 (BP Location: Left Arm, Patient Position: Sitting, Cuff Size: Normal)   Pulse 89   Temp 98.6 F (37 C) (Oral)   Ht 5\' 5"  (1.651 m)   Wt 176 lb 0.6 oz (79.9 kg)   LMP  (LMP Unknown)   SpO2 100%   BMI 29.29 kg/m    Physical Exam Vitals and nursing note reviewed.  Constitutional:      Appearance: She is well-developed.  HENT:     Head: Normocephalic and atraumatic.  Cardiovascular:     Rate and Rhythm: Normal rate and regular rhythm.     Heart sounds: Normal heart sounds.  Pulmonary:     Effort: Pulmonary effort is normal.     Breath sounds: Normal breath sounds.  Skin:    General: Skin is warm and dry.  Neurological:     Mental Status: She is alert and oriented to person, place, and time.  Psychiatric:        Behavior: Behavior normal.     No results found for any  visits on 08/02/21.    The ASCVD Risk score (Arnett DK, et al., 2019) failed to calculate for the following reasons:   The valid total cholesterol range is 130 to 320 mg/dL    Assessment & Plan:   Problem List Items Addressed This Visit       Respiratory   Emphysematous COPD (HCC)    Reviewed spirometry results from today.  Results consistent with moderate COPD.  FVC of 86%, FEV1 of 71% with a ratio of 63%.  No significant change in FEV1 after albuterol.  Though she did have a 20% improvement in PEF.  We discussed discontinuing the Flovent and switching to 2020.  Follow-up in 2 to 3 months.       Relevant Medications   Tiotropium Bromide-Olodaterol (STIOLTO RESPIMAT) 2.5-2.5 MCG/ACT AERS   Other Visit Diagnoses     SOB (shortness of breath)    -  Primary   Relevant Medications   albuterol (VENTOLIN HFA) 108 (90 Base) MCG/ACT inhaler 2 puff (Completed)   Other Relevant Orders   PR EVAL OF BRONCHOSPASM   COPD, moderate (HCC)  Relevant Medications   albuterol (VENTOLIN HFA) 108 (90 Base) MCG/ACT inhaler 2 puff (Completed)   Tiotropium Bromide-Olodaterol (STIOLTO RESPIMAT) 2.5-2.5 MCG/ACT AERS   Skin lesion       Relevant Orders   Ambulatory referral to Dermatology       Dermatology referral placed.  No follow-ups on file.    Nani Gasser, MD

## 2021-08-02 NOTE — Assessment & Plan Note (Addendum)
Reviewed spirometry results from today.  Results consistent with moderate COPD.  FVC of 86%, FEV1 of 71% with a ratio of 63%.  No significant change in FEV1 after albuterol.  Though she did have a 20% improvement in PEF.  We discussed discontinuing the Flovent and switching to [employer].  Follow-up in 2 to 3 months.

## 2021-08-03 DIAGNOSIS — Z1211 Encounter for screening for malignant neoplasm of colon: Secondary | ICD-10-CM | POA: Diagnosis not present

## 2021-08-03 DIAGNOSIS — R1013 Epigastric pain: Secondary | ICD-10-CM | POA: Diagnosis not present

## 2021-08-09 ENCOUNTER — Encounter: Payer: Self-pay | Admitting: Family Medicine

## 2021-08-09 DIAGNOSIS — Q254 Congenital malformation of aorta unspecified: Secondary | ICD-10-CM

## 2021-08-09 NOTE — Telephone Encounter (Signed)
Referral placed.  T. Charvi Gammage, CMA 

## 2021-08-09 NOTE — Telephone Encounter (Signed)
Okay to go ahead and place referral.  Okay to place me as PCP for now.  Plan was to get her started with her new provider in July but for now okay to put my name on the referral so that I can get the notes back from Dr. Cay Schillings

## 2021-08-16 ENCOUNTER — Encounter: Payer: Self-pay | Admitting: Family Medicine

## 2021-08-16 DIAGNOSIS — I77810 Thoracic aortic ectasia: Secondary | ICD-10-CM

## 2021-08-17 DIAGNOSIS — I77810 Thoracic aortic ectasia: Secondary | ICD-10-CM | POA: Insufficient documentation

## 2021-08-26 ENCOUNTER — Other Ambulatory Visit: Payer: Self-pay | Admitting: Cardiology

## 2021-08-26 DIAGNOSIS — R072 Precordial pain: Secondary | ICD-10-CM

## 2021-09-05 ENCOUNTER — Other Ambulatory Visit: Payer: Self-pay | Admitting: Osteopathic Medicine

## 2021-09-05 DIAGNOSIS — Z124 Encounter for screening for malignant neoplasm of cervix: Secondary | ICD-10-CM | POA: Diagnosis not present

## 2021-09-05 DIAGNOSIS — Z01419 Encounter for gynecological examination (general) (routine) without abnormal findings: Secondary | ICD-10-CM | POA: Diagnosis not present

## 2021-09-05 DIAGNOSIS — K219 Gastro-esophageal reflux disease without esophagitis: Secondary | ICD-10-CM

## 2021-09-05 DIAGNOSIS — Z1151 Encounter for screening for human papillomavirus (HPV): Secondary | ICD-10-CM | POA: Diagnosis not present

## 2021-09-05 DIAGNOSIS — N952 Postmenopausal atrophic vaginitis: Secondary | ICD-10-CM | POA: Diagnosis not present

## 2021-09-05 LAB — HM PAP SMEAR

## 2021-09-05 LAB — RESULTS CONSOLE HPV: CHL HPV: NEGATIVE

## 2021-09-11 DIAGNOSIS — L814 Other melanin hyperpigmentation: Secondary | ICD-10-CM | POA: Diagnosis not present

## 2021-09-11 DIAGNOSIS — D235 Other benign neoplasm of skin of trunk: Secondary | ICD-10-CM | POA: Diagnosis not present

## 2021-09-11 DIAGNOSIS — L821 Other seborrheic keratosis: Secondary | ICD-10-CM | POA: Diagnosis not present

## 2021-09-11 DIAGNOSIS — C44311 Basal cell carcinoma of skin of nose: Secondary | ICD-10-CM | POA: Diagnosis not present

## 2021-09-11 DIAGNOSIS — D485 Neoplasm of uncertain behavior of skin: Secondary | ICD-10-CM | POA: Diagnosis not present

## 2021-09-12 DIAGNOSIS — K2950 Unspecified chronic gastritis without bleeding: Secondary | ICD-10-CM | POA: Diagnosis not present

## 2021-09-12 DIAGNOSIS — R1013 Epigastric pain: Secondary | ICD-10-CM | POA: Diagnosis not present

## 2021-09-12 DIAGNOSIS — K3189 Other diseases of stomach and duodenum: Secondary | ICD-10-CM | POA: Diagnosis not present

## 2021-09-20 ENCOUNTER — Other Ambulatory Visit: Payer: Self-pay | Admitting: *Deleted

## 2021-09-20 DIAGNOSIS — I708 Atherosclerosis of other arteries: Secondary | ICD-10-CM

## 2021-09-20 DIAGNOSIS — I779 Disorder of arteries and arterioles, unspecified: Secondary | ICD-10-CM

## 2021-09-20 DIAGNOSIS — I6523 Occlusion and stenosis of bilateral carotid arteries: Secondary | ICD-10-CM

## 2021-09-20 DIAGNOSIS — I70213 Atherosclerosis of native arteries of extremities with intermittent claudication, bilateral legs: Secondary | ICD-10-CM

## 2021-09-22 ENCOUNTER — Other Ambulatory Visit: Payer: Self-pay | Admitting: Osteopathic Medicine

## 2021-10-04 DIAGNOSIS — K5909 Other constipation: Secondary | ICD-10-CM | POA: Diagnosis not present

## 2021-10-04 DIAGNOSIS — K219 Gastro-esophageal reflux disease without esophagitis: Secondary | ICD-10-CM | POA: Diagnosis not present

## 2021-10-04 DIAGNOSIS — R1013 Epigastric pain: Secondary | ICD-10-CM | POA: Diagnosis not present

## 2021-10-04 DIAGNOSIS — R1011 Right upper quadrant pain: Secondary | ICD-10-CM | POA: Diagnosis not present

## 2021-10-11 DIAGNOSIS — R1013 Epigastric pain: Secondary | ICD-10-CM | POA: Diagnosis not present

## 2021-10-11 DIAGNOSIS — R1011 Right upper quadrant pain: Secondary | ICD-10-CM | POA: Diagnosis not present

## 2021-10-12 ENCOUNTER — Encounter: Payer: Self-pay | Admitting: Family Medicine

## 2021-10-12 ENCOUNTER — Ambulatory Visit: Payer: Federal, State, Local not specified - PPO | Admitting: Family Medicine

## 2021-10-12 VITALS — BP 135/70 | HR 86 | Ht 65.0 in | Wt 174.0 lb

## 2021-10-12 DIAGNOSIS — K588 Other irritable bowel syndrome: Secondary | ICD-10-CM | POA: Diagnosis not present

## 2021-10-12 DIAGNOSIS — J439 Emphysema, unspecified: Secondary | ICD-10-CM | POA: Diagnosis not present

## 2021-10-12 DIAGNOSIS — Z85828 Personal history of other malignant neoplasm of skin: Secondary | ICD-10-CM

## 2021-10-12 DIAGNOSIS — R1906 Epigastric swelling, mass or lump: Secondary | ICD-10-CM

## 2021-10-12 MED ORDER — FLUTICASONE PROPIONATE HFA 110 MCG/ACT IN AERO: 2.0000 | INHALATION_SPRAY | Freq: Two times a day (BID) | RESPIRATORY_TRACT | 3 refills | Status: DC

## 2021-10-12 MED ORDER — PROMETHAZINE HCL 12.5 MG PO TABS: 12.5000 mg | ORAL_TABLET | ORAL | 1 refills | Status: DC | PRN

## 2021-10-12 NOTE — Assessment & Plan Note (Signed)
SPECT Linzess will be helpful.  Did refill the Phenergan for as needed use.

## 2021-10-12 NOTE — Progress Notes (Signed)
Established Patient Office Visit  Subjective   Patient ID: Tina Navarro, female    DOB: [birth year]  Age: 61 y.o. MRN: [id number]  Chief Complaint  Patient presents with   Follow-up    HPI  Comes in today specifically for a a lump and pain over that epigastric area at the bottom of the sternum near the xiphoid process.  She been noticing it for a while.  It felt like it was sore but yet a burning sensation.  Lately she has been wearing a bra more consistently and that seems to irritate it even more.  She is also had some upper abdominal pain into the epigastric area which she is being evaluated for with GI.  She did have a basal cell skin cancer removed off the tip of her nose and has follow-up with Mohs surgery coming up.  Recently started on Linzess.  In fact she is only taken 1 dose thus far and actually had diarrhea afterwards but plans on taking it again today.    ROS    Objective:     BP 135/70   Pulse 86   Ht 5\' 5"  (1.651 m)   Wt 174 lb (78.9 kg)   LMP  (LMP Unknown)   SpO2 97%   BMI 28.96 kg/m    Physical Exam   Results for orders placed or performed in visit on 10/12/21  HM PAP SMEAR  Result Value Ref Range   HM Pap smear WNL   Results Console HPV  Result Value Ref Range   CHL HPV Negative       The ASCVD Risk score (Arnett DK, et al., 2019) failed to calculate for the following reasons:   The valid total cholesterol range is 130 to 320 mg/dL    Assessment & Plan:   Problem List Items Addressed This Visit       Respiratory   Emphysematous COPD (HCC)    Also reports that she did try the fluticasone for a month and then switched to the Stiolto for a month.  She actually felt like her breathing was much better on the fluticasone.  When she switched to the Stiolto she would feel more short of breath and tight especially at night and would feel a pain in the back of her lungs when using the Stiolto.  We will discontinue the Stiolto and refill the  fluticasone.      Relevant Medications   promethazine (PHENERGAN) 12.5 MG tablet   fluticasone (FLOVENT HFA) 110 MCG/ACT inhaler     Digestive   Irritable bowel syndrome    SPECT Linzess will be helpful.  Did refill the Phenergan for as needed use.      Relevant Medications   linaclotide (LINZESS) 72 MCG capsule   promethazine (PHENERGAN) 12.5 MG tablet     Musculoskeletal and Integument   History of basal cell cancer, nose   Other Visit Diagnoses     Epigastric mass    -  Primary   Relevant Orders   2020 MiscellaneoUS Localization       She actually has a lump over the end of the sternum near the xiphoid process.  It feels fairly well-circumscribed and does not feel like its bony and part of the actual xiphoid process itself.  She does not remember feeling that lump before it does not feel like it is in the abdominal wall.  So would like to start with a miscellaneous ultrasound for further work-up to see if it looks  like it may be cystic or fatty.  No follow-ups on file.    Nani Gasser, MD

## 2021-10-12 NOTE — Assessment & Plan Note (Signed)
Also reports that she did try the fluticasone for a month and then switched to the [facility] for a month.  She actually felt like her breathing was much better on the fluticasone.  When she switched to the Stiolto she would feel more short of breath and tight especially at night and would feel a pain in the back of her lungs when using the Stiolto.  We will discontinue the Stiolto and refill the fluticasone.

## 2021-10-16 ENCOUNTER — Ambulatory Visit (HOSPITAL_COMMUNITY)
Admission: RE | Admit: 2021-10-16 | Discharge: 2021-10-16 | Disposition: A | Discharge status: Home / Self Care | Payer: Federal, State, Local not specified - PPO | Source: Ambulatory Visit | Attending: Surgery | Admitting: Surgery

## 2021-10-16 ENCOUNTER — Ambulatory Visit (INDEPENDENT_AMBULATORY_CARE_PROVIDER_SITE_OTHER)
Admission: RE | Admit: 2021-10-16 | Discharge: 2021-10-16 | Disposition: A | Discharge status: Home / Self Care | Payer: Federal, State, Local not specified - PPO | Source: Ambulatory Visit | Attending: Surgery | Admitting: Surgery

## 2021-10-16 ENCOUNTER — Ambulatory Visit: Payer: Federal, State, Local not specified - PPO | Admitting: Surgery

## 2021-10-16 ENCOUNTER — Encounter: Payer: Self-pay | Admitting: Surgery

## 2021-10-16 VITALS — BP 125/81 | HR 82 | Temp 97.9°F | Resp 20 | Ht 65.0 in | Wt 174.0 lb

## 2021-10-16 DIAGNOSIS — I708 Atherosclerosis of other arteries: Secondary | ICD-10-CM | POA: Insufficient documentation

## 2021-10-16 DIAGNOSIS — I6523 Occlusion and stenosis of bilateral carotid arteries: Secondary | ICD-10-CM

## 2021-10-16 DIAGNOSIS — I70213 Atherosclerosis of native arteries of extremities with intermittent claudication, bilateral legs: Secondary | ICD-10-CM

## 2021-10-16 DIAGNOSIS — I779 Disorder of arteries and arterioles, unspecified: Secondary | ICD-10-CM | POA: Diagnosis not present

## 2021-10-16 NOTE — Progress Notes (Signed)
[facility] [place]  Patient name: Odessa Nishi MRN: [id number] DOB: 1961/01/01 Sex: female   REASON FOR VISIT:    Follow up  HISOTRY OF PRESENT ILLNESS:    Talor Desrosiers is a 61 y.o. female who has undergone the following Prior to 2007, iliac intervention at a different location 07/16/2017: Angioplasty bilateral common iliac arteries 12/02/2018: Bilateral iliac stent (VBX) 02/22/2021: Left carotid subclavian bypass graft with 6 mm dacryon  The patient is back for follow-up.  She had a CT scan that showed a 3.5 cm a sending aortic aneurysm.  She is also undergone repair of a intracranial aneurysm.  She denies claudication symptoms.  She has no upper extremity symptoms.  She continues to take a statin for hypercholesterolemia.  She is medically managed for hypertension.  She is a former smoker.  PAST MEDICAL HISTORY:   Past Medical History:  Diagnosis Date   Anxiety    Basilar artery aneurysm (HCC)    s/p stent supported coil embolization of basilar aneurysm 12/02/20   Dyspnea    Emphysematous COPD (HCC) 04/23/2017   GERD (gastroesophageal reflux disease)    Hyperlipidemia    Hypertension    Peripheral arterial disease (HCC)    Thyroid nodule 04/23/2017     FAMILY HISTORY:   Family History  Problem Relation Age of Onset   Hypertension Mother    Hypertension Father    Heart disease Father    Peripheral vascular disease Father    Alcohol abuse Father    Fibroids Sister    Cancer Maternal Grandmother        BREAST   Stroke Maternal Grandmother        CEBERAL ATHEROSCLEROSIS   Cancer - Ovarian Cousin     SOCIAL HISTORY:   Social History   Tobacco Use   Smoking status: Former    Packs/day: 0.50    Years: 35.00    Total pack years: 17.50    Types: Cigarettes    Quit date: 12/18/2017    Years since quitting: 3.8   Smokeless tobacco: Never   Tobacco comments:    As per pt - smoking 1/2 pk daily   Substance Use Topics   Alcohol use: No     ALLERGIES:   Allergies  Allergen Reactions   Doxycycline Hives, Rash and Other (See Comments)    Pain, photosensitive     CURRENT MEDICATIONS:   Current Outpatient Medications  Medication Sig Dispense Refill   acetaminophen (TYLENOL) 500 MG tablet Take 1,000 mg by mouth every 6 (six) hours as needed for headache.     albuterol (VENTOLIN HFA) 108 (90 Base) MCG/ACT inhaler Inhale 1-2 puffs into the lungs every 4 (four) hours as needed for wheezing or shortness of breath. 1 each 2   amLODipine (NORVASC) 5 MG tablet Take 5 mg by mouth at bedtime.     aspirin EC 81 MG tablet Take 1 tablet (81 mg total) by mouth daily. Swallow whole. 90 tablet 3   atorvastatin (LIPITOR) 80 MG tablet Take 1 tablet (80 mg total) by mouth at bedtime. 90 tablet 3   Cholecalciferol (VITAMIN D3 PO) Take 1 tablet by mouth in the morning.     estradiol (ESTRACE) 0.1 MG/GM vaginal cream Place 1 g vaginally 2 (two) times a week.     fluticasone (FLOVENT HFA) 110 MCG/ACT inhaler Inhale 2 puffs into the lungs in the morning and at bedtime. 1 each 3   linaclotide (LINZESS) 72 MCG capsule Take by  mouth.     losartan (COZAAR) 100 MG tablet Take 1 tablet (100 mg total) by mouth daily. 90 tablet 1   omeprazole (PRILOSEC) 40 MG capsule Take 1 capsule (40 mg total) by mouth daily. 90 capsule 3   promethazine (PHENERGAN) 12.5 MG tablet Take 1 tablet (12.5 mg total) by mouth every 4 (four) hours as needed for nausea or vomiting. 30 tablet 1   senna (SENOKOT) 8.6 MG tablet Take 2 tablets (17.2 mg total) by mouth daily. As needed for constipation (Patient taking differently: Take 2 tablets by mouth at bedtime.) 90 tablet 1   vitamin B-12 (CYANOCOBALAMIN) 1000 MCG tablet Take 1,000 mcg by mouth daily.      ezetimibe (ZETIA) 10 MG tablet Take 1 tablet (10 mg total) by mouth daily. 90 tablet 3   No current facility-administered medications for this visit.    REVIEW OF SYSTEMS:    [X]  denotes positive finding, [ ]  denotes negative finding Cardiac  Comments:  Chest pain or chest pressure:    Shortness of breath upon exertion:    Short of breath when lying flat:    Irregular heart rhythm:        Vascular    Pain in calf, thigh, or hip brought on by ambulation:    Pain in feet at night that wakes you up from your sleep:     Blood clot in your veins:    Leg swelling:         Pulmonary    Oxygen at home:    Productive cough:     Wheezing:         Neurologic    Sudden weakness in arms or legs:     Sudden numbness in arms or legs:     Sudden onset of difficulty speaking or slurred speech:    Temporary loss of vision in one eye:     Problems with dizziness:         Gastrointestinal    Blood in stool:     Vomited blood:         Genitourinary    Burning when urinating:     Blood in urine:        Psychiatric    Major depression:         Hematologic    Bleeding problems:    Problems with blood clotting too easily:        Skin    Rashes or ulcers:        Constitutional    Fever or chills:      PHYSICAL EXAM:   Vitals:   10/16/21 1009  BP: 125/81  Pulse: 82  Resp: 20  Temp: 97.9 F (36.6 C)  SpO2: 98%  Weight: 174 lb (78.9 kg)  Height: 5\' 5"  (1.651 m)    GENERAL: The patient is a well-nourished female, in no acute distress. The vital signs are documented above. CARDIAC: There is a regular rate and rhythm.  VASCULAR: Palpable radial and pedal pulses bilaterally PULMONARY: Non-labored respirations MUSCULOSKELETAL: There are no major deformities or cyanosis. NEUROLOGIC: No focal weakness or paresthesias are detected. SKIN: There are no ulcers or rashes noted. PSYCHIATRIC: The patient has a normal affect.  STUDIES:   I have reviewed the following:  CT Chest: Significant atherosclerotic changes are noted with calcifications in the thoracic aorta and its major branches. Coronary artery calcifications are seen. There is possible  high-grade stenosis or occlusion in the proximal left subclavian artery.   COPD. There  are no new infiltrates. There is no pleural effusion or pneumothorax.   Other findings as described in the body of the report. ABI/TBIToday's ABIToday's TBIPrevious ABIPrevious TBI  +-------+-----------+-----------+------------+------------+  Right  1.17       0.81       1.04        0.88          +-------+-----------+-----------+------------+------------+  Left   1.21       0.91       1.04        0.83          +-------+-----------+-----------+------------+------------+  Patent bilateral CIA stents.  No evidence of AAA.   CAROTID: Right Carotid: Velocities in the right ICA are consistent with a 1-39%  stenosis.                 The ECA appears >50% stenosed. Patent CCA to subclavian  bypass                 graft.   Left Carotid: Velocities in the left ICA are consistent with a 1-39%  stenosis.   Vertebrals:  Bilateral vertebral arteries demonstrate antegrade flow.  Subclavians: Normal flow hemodynamics were seen in the right subclavian  artery.               Patent CCA to subclavian bypass graft with normal flow  distally.  MEDICAL ISSUES:   Carotid: Left carotid subclavian bypass graft is widely patent.  She has mild to moderate stenosis in the right carotid artery.  I will repeat her ultrasound in 1 year  PAD: Iliac stents are widely patent with palpable pedal pulses.  She will follow-up in 1 year with repeat vascular studies  A sending aortic aneurysm: This was detected by CT scan.  Maximum diameter is 3.5 cm.  This location will require management by cardiac surgery.  This can certainly be monitored by cardiology.  She is scheduled to see Dr. Jens Som in a few months.    Charlena Cross, MD, FACS [facility] [phone] Pager [phone]

## 2021-10-17 ENCOUNTER — Ambulatory Visit (INDEPENDENT_AMBULATORY_CARE_PROVIDER_SITE_OTHER): Payer: Federal, State, Local not specified - PPO

## 2021-10-17 DIAGNOSIS — R222 Localized swelling, mass and lump, trunk: Secondary | ICD-10-CM | POA: Diagnosis not present

## 2021-10-17 DIAGNOSIS — R1906 Epigastric swelling, mass or lump: Secondary | ICD-10-CM | POA: Diagnosis not present

## 2021-10-18 DIAGNOSIS — C44311 Basal cell carcinoma of skin of nose: Secondary | ICD-10-CM | POA: Diagnosis not present

## 2021-10-19 NOTE — Progress Notes (Signed)
Hi Tina Navarro, the area looks like soft tissue consistent with just fatty cells.  Sometimes a group of fat cells will grow into a lump called a lipoma.  The good news is, that these are benign they are not harmful.  If they become bothersome then they can be removed if they are in areas that get a lot of pressure or irritation.  Most people just continue to watch them but if you feel like you would prefer to have a consultation to have it removed then we would refer you to general surgery.  Is fairly large at 14 cm.  They said they also did compare this to the CT that you had back in May and so did not see anything concerning.

## 2021-10-20 ENCOUNTER — Ambulatory Visit: Payer: Self-pay | Admitting: Licensed Clinical Social Worker

## 2021-10-20 NOTE — Patient Outreach (Signed)
  Care Coordination   Initial Visit Note   10/20/2021 Name: Tina Navarro MRN: [id number] DOB: 1960-08-25  Tina Navarro is a 61 y.o. year old female who sees Metheney, Barbarann Ehlers, MD for primary care. I spoke with  Tina Navarro by phone today.  What matters to the patients health and wellness today?declined services at this time    Goals Addressed             This Visit's Progress    Patient declined servies       Care Coordination Interventions:   Active listening / Reflection utilized  Informed client about Care Coordination program services Client appreciated call . She feels that she is stable now. She declined program services at this time     SDOH assessments and interventions completed:  No     Care Coordination Interventions Activated:  No  Care Coordination Interventions:  No, not indicated   Follow up plan: No further intervention required.   Encounter Outcome:  Pt. Visit Completed

## 2021-10-20 NOTE — Patient Instructions (Addendum)
Visit Information  Thank you for taking time to visit with me today. Please don't hesitate to contact me if I can be of assistance to you before our next scheduled telephone appointment.  Following are the goals we discussed today:   No further intervention needed at present  Please call the care guide team at [phone] if you need to cancel or reschedule your appointment.   If you are experiencing a Mental Health or Behavioral Health Crisis or need someone to talk to, please go to [facility] [address] [place] [phone])   Following is a copy of your full plan of care:   Care Coordination Interventions:  Active listening / Reflection utilized  Informed client about Care Coordination program services Client appreciated call . She feels that she is stable now. She declined program services at this time   Ms. Happ was given information about Care Management services by the embedded care coordination team including:  Care Management services include personalized support from designated clinical staff supervised by her physician, including individualized plan of care and coordination with other care providers 24/7 contact phone numbers for assistance for urgent and routine care needs. The patient may stop CCM services at any time (effective at the end of the month) by phone call to the office staff.  Patient agreed to services and verbal consent obtained.   Kelton Pillar.Sugar Vanzandt MSW, LCSW Licensed Visual merchandiser [facility] Care Management [phone]

## 2021-12-07 ENCOUNTER — Other Ambulatory Visit: Payer: Self-pay | Admitting: Cardiology

## 2021-12-07 DIAGNOSIS — R072 Precordial pain: Secondary | ICD-10-CM

## 2021-12-12 NOTE — Progress Notes (Signed)
HPI: FU coronary calcification.  Patient does have a history of peripheral vascular disease and has had prior angioplasty and stent of her left common iliac artery and right common iliac artery.  Left subclavian was occluded. Nuclear study 8/22 showed EF 76 with normal perfusion. Had left carotid subclavian bypass 12/22.  Chest CT May 2023 showed atherosclerosis in the thoracic aorta and coronary calcification.  Carotid Dopplers August 2023 showed 1 to 39% bilateral stenosis.  There was patent, carotid artery to subclavian bypass graft bilaterally.  Dopplers August 2023 showed patent bilateral common iliac artery stents and no abdominal aortic aneurysm.  Chest CT May 2023 showed atherosclerosis, coronary calcification, COPD and ectatic ascending aorta at 3.5 cm.  Since last seen, the patient has dyspnea with more extreme activities but not with routine activities. It is relieved with rest. It is not associated with chest pain. There is no orthopnea, PND or pedal edema. There is no syncope or palpitations. There is no exertional chest pain.   Current Outpatient Medications  Medication Sig Dispense Refill   acetaminophen (TYLENOL) 500 MG tablet Take 1,000 mg by mouth every 6 (six) hours as needed for headache.     albuterol (VENTOLIN HFA) 108 (90 Base) MCG/ACT inhaler Inhale 1-2 puffs into the lungs every 4 (four) hours as needed for wheezing or shortness of breath. 1 each 2   amLODipine (NORVASC) 5 MG tablet Take 1 tablet (5 mg total) by mouth daily. 180 tablet 3   aspirin EC 81 MG tablet Take 1 tablet (81 mg total) by mouth daily. Swallow whole. 90 tablet 3   atorvastatin (LIPITOR) 80 MG tablet Take 1 tablet (80 mg total) by mouth at bedtime. 90 tablet 3   Cholecalciferol (VITAMIN D3 PO) Take 1 tablet by mouth in the morning.     estradiol (ESTRACE) 0.1 MG/GM vaginal cream Place 1 g vaginally 2 (two) times a week.     fluticasone (FLOVENT HFA) 110 MCG/ACT inhaler Inhale 2 puffs into the lungs in  the morning and at bedtime. 1 each 3   LINZESS 72 MCG capsule Take 72 mcg by mouth daily.     losartan (COZAAR) 100 MG tablet Take 1 tablet (100 mg total) by mouth daily. 90 tablet 1   omeprazole (PRILOSEC) 40 MG capsule Take 1 capsule (40 mg total) by mouth daily. 90 capsule 3   promethazine (PHENERGAN) 12.5 MG tablet Take 1 tablet (12.5 mg total) by mouth every 4 (four) hours as needed for nausea or vomiting. 30 tablet 1   vitamin B-12 (CYANOCOBALAMIN) 1000 MCG tablet Take 1,000 mcg by mouth daily.      No current facility-administered medications for this visit.     Past Medical History:  Diagnosis Date   Anxiety    Basilar artery aneurysm (HCC)    s/p stent supported coil embolization of basilar aneurysm 12/02/20   Dyspnea    Emphysematous COPD (HCC) 04/23/2017   GERD (gastroesophageal reflux disease)    Hyperlipidemia    Hypertension    Peripheral arterial disease (HCC)    Thyroid nodule 04/23/2017    Past Surgical History:  Procedure Laterality Date   ABDOMINAL AORTOGRAM N/A 07/16/2017   Procedure: ABDOMINAL AORTOGRAM;  Surgeon: Nada Libman, MD;  Location: [facility];  Service: Cardiovascular;  Laterality: N/A;   ABDOMINAL AORTOGRAM W/LOWER EXTREMITY Bilateral 12/02/2018   Procedure: ABDOMINAL AORTOGRAM W/LOWER EXTREMITY;  Surgeon: Nada Libman, MD;  Location: [facility];  Service: Cardiovascular;  Laterality: Bilateral;   AORTIC ARCH ANGIOGRAPHY N/A 12/02/2018   Procedure: AORTIC ARCH ANGIOGRAPHY;  Surgeon: Serafina Mitchell, MD;  Location: [place] [facility];  Service: Cardiovascular;  Laterality: N/A;   basilaraneurysm repair  11/2020   CAROTID-SUBCLAVIAN BYPASS GRAFT Left 02/22/2021   Procedure: LEFT CAROTID-SUBCLAVIAN BYPASS USING HEAMSHIELD GOLD 6MM X30MM;  Surgeon: Serafina Mitchell, MD;  Location: [facility] OR;  Service: Vascular;  Laterality: Left;   ENDOMETRIAL BIOPSY  2005   FEMORAL ARTERY STENT Bilateral 2008   IR 3D INDEPENDENT WKST  12/02/2020    IR ANGIO INTRA EXTRACRAN SEL INTERNAL CAROTID UNI R MOD SED  11/08/2020   IR ANGIO VERTEBRAL SEL SUBCLAVIAN INNOMINATE UNI R MOD SED  11/08/2020   IR ANGIO VERTEBRAL SEL VERTEBRAL BILAT MOD SED  12/02/2020   IR ANGIO VERTEBRAL SEL VERTEBRAL UNI R MOD SED  06/16/2021   IR ANGIOGRAM FOLLOW UP STUDY  12/02/2020   IR ANGIOGRAM FOLLOW UP STUDY  12/02/2020   IR ANGIOGRAM FOLLOW UP STUDY  12/02/2020   IR ANGIOGRAM FOLLOW UP STUDY  12/02/2020   IR TRANSCATH/EMBOLIZ  12/02/2020   IR US GUIDE VASC ACCESS RIGHT  11/08/2020   IR US GUIDE VASC ACCESS RIGHT  12/02/2020   IR US GUIDE VASC ACCESS RIGHT  06/16/2021   IR US GUIDE VASC ACCESS RIGHT  06/16/2021   LOWER EXTREMITY ANGIOGRAPHY Bilateral 07/16/2017   Procedure: Lower Extremity Angiography;  Surgeon: Serafina Mitchell, MD;  Location: [place] CV LAB;  Service: Cardiovascular;  Laterality: Bilateral;   NASAL SINUS SURGERY     PERIPHERAL VASCULAR BALLOON ANGIOPLASTY Bilateral 07/16/2017   Procedure: PERIPHERAL VASCULAR BALLOON ANGIOPLASTY;  Surgeon: Serafina Mitchell, MD;  Location: [place] [facility];  Service: Cardiovascular;  Laterality: Bilateral;  iliacs   PERIPHERAL VASCULAR INTERVENTION Bilateral 12/02/2018   Procedure: PERIPHERAL VASCULAR INTERVENTION;  Surgeon: Serafina Mitchell, MD;  Location: [place] [facility];  Service: Cardiovascular;  Laterality: Bilateral;   RADIOLOGY WITH ANESTHESIA N/A 12/02/2020   Procedure: Arteriogram, Surpass embolization of basilar aneurysm;  Surgeon: Consuella Lose, MD;  Location: [place];  Service: Radiology;  Laterality: N/A;   TONSILLECTOMY     TUBAL LIGATION     VASCULAR SURGERY      Social History   Socioeconomic History   Marital status: Married    Spouse name: Not on file   Number of children: 1   Years of education: Not on file   Highest education level: Not on file  Occupational History   Not on file  Tobacco Use   Smoking status: Former    Packs/day: 0.50    Years: 35.00    Total  pack years: 17.50    Types: Cigarettes    Quit date: 12/18/2017    Years since quitting: 4.0   Smokeless tobacco: Never   Tobacco comments:    As per pt - smoking 1/2 pk daily  Vaping Use   Vaping Use: Never used  Substance and Sexual Activity   Alcohol use: No   Drug use: No   Sexual activity: Yes  Other Topics Concern   Not on file  Social History Narrative   Not on file   Social Determinants of Health   Financial Resource Strain: Not on file  Food Insecurity: Not on file  Transportation Needs: Not on file  Physical Activity: Not on file  Stress: Not on file  Social Connections: Not on file  Intimate Partner Violence: Not on file    Family  History  Problem Relation Age of Onset   Hypertension Mother    Hypertension Father    Heart disease Father    Peripheral vascular disease Father    Alcohol abuse Father    Fibroids Sister    Cancer Maternal Grandmother        BREAST   Stroke Maternal Grandmother        CEBERAL ATHEROSCLEROSIS   Cancer - Ovarian Cousin     ROS: no fevers or chills, productive cough, hemoptysis, dysphasia, odynophagia, melena, hematochezia, dysuria, hematuria, rash, seizure activity, orthopnea, PND, pedal edema, claudication. Remaining systems are negative.  Physical Exam: Well-developed well-nourished in no acute distress.  Skin is warm and dry.  HEENT is normal.  Neck is supple.  Chest is clear to auscultation with normal expansion.  Cardiovascular exam is regular rate and rhythm.  Abdominal exam nontender or distended. No masses palpated. Extremities show no edema. neuro grossly intact  ECG-normal sinus rhythm at a rate of 85, no ST changes.  Personally reviewed  A/P  1 coronary calcification-patient denies chest pain.  Previous nuclear study showed no ischemia.  Continue aspirin and statin.  2 carotid artery disease/peripheral vascular disease-followed by vascular surgery.  3 hypertension-patient's blood pressure is controlled  today.  Continue present medications and follow.  4 hyperlipidemia-continue Lipitor and Zetia.  Goal LDL less than 55.  5 ectatic thoracic aorta-we will plan repeat CTA May 2025.  Olga Millers, MD

## 2021-12-25 ENCOUNTER — Encounter: Payer: Self-pay | Admitting: Cardiology

## 2021-12-25 ENCOUNTER — Ambulatory Visit (INDEPENDENT_AMBULATORY_CARE_PROVIDER_SITE_OTHER): Payer: Federal, State, Local not specified - PPO | Admitting: Cardiology

## 2021-12-25 VITALS — BP 125/73 | HR 85 | Ht 65.0 in | Wt 176.1 lb

## 2021-12-25 DIAGNOSIS — I2584 Coronary atherosclerosis due to calcified coronary lesion: Secondary | ICD-10-CM | POA: Diagnosis not present

## 2021-12-25 DIAGNOSIS — E7800 Pure hypercholesterolemia, unspecified: Secondary | ICD-10-CM | POA: Diagnosis not present

## 2021-12-25 DIAGNOSIS — I779 Disorder of arteries and arterioles, unspecified: Secondary | ICD-10-CM | POA: Diagnosis not present

## 2021-12-25 DIAGNOSIS — I10 Essential (primary) hypertension: Secondary | ICD-10-CM

## 2021-12-25 DIAGNOSIS — I2510 Atherosclerotic heart disease of native coronary artery without angina pectoris: Secondary | ICD-10-CM

## 2021-12-25 NOTE — Patient Instructions (Signed)
  Follow-Up: At [facility], you and your health needs are our priority.  As part of our continuing mission to provide you with exceptional heart care, we have created designated Provider Care Teams.  These Care Teams include your primary Cardiologist (physician) and Advanced Practice Providers (APPs -  Physician Assistants and Nurse Practitioners) who all work together to provide you with the care you need, when you need it.  We recommend signing up for the patient portal called "MyChart".  Sign up information is provided on this After Visit Summary.  MyChart is used to connect with patients for Virtual Visits (Telemedicine).  Patients are able to view lab/test results, encounter notes, upcoming appointments, etc.  Non-urgent messages can be sent to your provider as well.   To learn more about what you can do with MyChart, go to [email or web address].    Your next appointment:   12 month(s)  The format for your next appointment:   In Person  Provider:   Brian Crenshaw, MD   

## 2021-12-26 ENCOUNTER — Ambulatory Visit: Payer: Federal, State, Local not specified - PPO | Admitting: Physician Assistant

## 2021-12-26 ENCOUNTER — Encounter: Payer: Self-pay | Admitting: Physician Assistant

## 2021-12-26 VITALS — BP 124/60 | HR 90 | Temp 98.2°F | Ht 65.0 in | Wt 176.0 lb

## 2021-12-26 DIAGNOSIS — J441 Chronic obstructive pulmonary disease with (acute) exacerbation: Secondary | ICD-10-CM | POA: Diagnosis not present

## 2021-12-26 MED ORDER — HYDROCODONE BIT-HOMATROP MBR 5-1.5 MG/5ML PO SOLN: 5.0000 mL | Freq: Three times a day (TID) | ORAL | 0 refills | Status: DC | PRN

## 2021-12-26 MED ORDER — AZITHROMYCIN 250 MG PO TABS: ORAL_TABLET | ORAL | 0 refills | Status: DC

## 2021-12-26 MED ORDER — PREDNISONE 50 MG PO TABS: ORAL_TABLET | ORAL | 0 refills | Status: DC

## 2021-12-26 NOTE — Patient Instructions (Signed)

## 2021-12-26 NOTE — Progress Notes (Signed)
Acute Office Visit  Subjective:     Patient ID: Tina Navarro, female    DOB: 1961-02-18, 61 y.o.   MRN: [id number]  Chief Complaint  Patient presents with   Cough    HPI Patient is in today for cough, congestion, post nasal drip, ST for a little over a week after getting the covid on October 25th. She had a low grade fever for one day. She has emphysema. She has been using her albuterol with little relief. She is SOB. No body aches. Nyquil and dayquil help some.   .. Active Ambulatory Problems    Diagnosis Date Noted   CN (constipation) 07/28/2011   Dyslipidemia 07/28/2011   Acid reflux 09/25/2006   Multinodular goiter 12/19/2011   Peripheral vascular disease ([place]) 09/25/2006   Irritable bowel syndrome 04/01/2015   Dyslipidemia, goal LDL below 70 04/01/2015   BPPV (benign paroxysmal positional vertigo) 06/28/2015   Abnormal findings on esophagogastroduodenoscopy (EGD) 03/18/2016   Essential hypertension 05/01/2016   Neuropathy involving both lower extremities 04/22/2017   Pleuritic chest pain 04/22/2017   Thyroid nodule 04/23/2017   Emphysematous COPD ([place]) 04/23/2017   Anxiety state 12/25/2017   Aneurysm, cerebral, nonruptured 12/02/2020   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     Objective:    BP 124/60   Pulse 90   Temp 98.2 F (36.8 C) (Oral)   Ht 5\' 5"  (1.651 m)   Wt 176 lb  (79.8 kg)   LMP  (LMP Unknown)   SpO2 98%   BMI 29.29 kg/m  BP Readings from Last 3 Encounters:  12/26/21 124/60  12/25/21 125/73  10/16/21 125/81   Wt Readings from Last 3 Encounters:  12/26/21 176 lb (79.8 kg)  12/25/21 176 lb 1.9 oz (79.9 kg)  10/16/21 174 lb (78.9 kg)      Physical Exam Constitutional:      Appearance: Normal appearance. She is obese.  HENT:     Head: Normocephalic.     Right Ear: Tympanic membrane normal.     Left Ear: Tympanic membrane normal.     Nose: Nose normal.     Mouth/Throat:     Pharynx: Posterior oropharyngeal erythema present. No oropharyngeal exudate.  Eyes:     Extraocular Movements: Extraocular movements intact.     Conjunctiva/sclera: Conjunctivae normal.     Pupils: Pupils are equal, round, and reactive to light.  Cardiovascular:     Rate and Rhythm: Normal rate and regular rhythm.     Pulses: Normal pulses.  Pulmonary:     Effort: Pulmonary effort is normal.     Breath sounds: Normal breath sounds.  Musculoskeletal:     Cervical back: Normal range of motion and neck supple.     Right lower leg: No edema.     Left lower leg: No edema.  Neurological:  General: No focal deficit present.     Mental Status: She is alert.  Psychiatric:        Mood and Affect: Mood normal.    Duoneb given in office today.        Assessment & Plan:  Marland KitchenMarland KitchenDanielle was seen today for cough.  Diagnoses and all orders for this visit:  COPD exacerbation (HCC) -     predniSONE (DELTASONE) 50 MG tablet; One tab PO daily for 5 days. -     azithromycin (ZITHROMAX Z-PAK) 250 MG tablet; Take 2 tablets (500 mg) on  Day 1,  followed by 1 tablet (250 mg) once daily on Days 2 through 5. -     HYDROcodone bit-homatropine (HYCODAN) 5-1.5 MG/5ML syrup; Take 5 mLs by mouth every 8 (eight) hours as needed for cough.   Zpak, prednisone and hycodan given today Rest and hydration Continue to use albuterol every 4-6 hours  Follow up as needed or if symptoms  persist or worsen.    Tandy Gaw, PA-C

## 2022-03-06 ENCOUNTER — Other Ambulatory Visit: Payer: Self-pay | Admitting: Osteopathic Medicine

## 2022-03-06 DIAGNOSIS — I70213 Atherosclerosis of native arteries of extremities with intermittent claudication, bilateral legs: Secondary | ICD-10-CM

## 2022-03-14 DIAGNOSIS — Z85828 Personal history of other malignant neoplasm of skin: Secondary | ICD-10-CM | POA: Diagnosis not present

## 2022-03-14 DIAGNOSIS — L821 Other seborrheic keratosis: Secondary | ICD-10-CM | POA: Diagnosis not present

## 2022-03-14 DIAGNOSIS — L905 Scar conditions and fibrosis of skin: Secondary | ICD-10-CM | POA: Diagnosis not present

## 2022-03-14 DIAGNOSIS — L814 Other melanin hyperpigmentation: Secondary | ICD-10-CM | POA: Diagnosis not present

## 2022-03-16 ENCOUNTER — Other Ambulatory Visit: Payer: Self-pay | Admitting: Cardiology

## 2022-03-16 ENCOUNTER — Other Ambulatory Visit: Payer: Self-pay | Admitting: Family Medicine

## 2022-03-16 DIAGNOSIS — E7800 Pure hypercholesterolemia, unspecified: Secondary | ICD-10-CM

## 2022-03-27 ENCOUNTER — Other Ambulatory Visit: Payer: Self-pay | Admitting: Family Medicine

## 2022-03-27 DIAGNOSIS — K588 Other irritable bowel syndrome: Secondary | ICD-10-CM

## 2022-04-18 DIAGNOSIS — L820 Inflamed seborrheic keratosis: Secondary | ICD-10-CM | POA: Diagnosis not present

## 2022-05-16 DIAGNOSIS — K5909 Other constipation: Secondary | ICD-10-CM | POA: Diagnosis not present

## 2022-05-16 DIAGNOSIS — K219 Gastro-esophageal reflux disease without esophagitis: Secondary | ICD-10-CM | POA: Diagnosis not present

## 2022-05-16 DIAGNOSIS — Z1211 Encounter for screening for malignant neoplasm of colon: Secondary | ICD-10-CM | POA: Diagnosis not present

## 2022-05-17 ENCOUNTER — Telehealth: Payer: Self-pay

## 2022-05-17 NOTE — Telephone Encounter (Signed)
OK to use acute slot next week.

## 2022-05-17 NOTE — Telephone Encounter (Signed)
Patient called to schedule an appointment .  C/o  some difficulty breathing  that comes and goes.  Patient has COPD and has had 45 lb weight gain after stopping smoking. She feels is related to this - patient states episodes are not severe.  Connected patient to front desk to schedule but informed her that if symptoms change or worsen before her upcoming scheduled appointment she should go to urgent care or ER .  She has voiced understanding.

## 2022-05-17 NOTE — Telephone Encounter (Signed)
Patient scheduled for 3/28/234.

## 2022-05-24 ENCOUNTER — Encounter: Payer: Self-pay | Admitting: Family Medicine

## 2022-05-24 ENCOUNTER — Ambulatory Visit (INDEPENDENT_AMBULATORY_CARE_PROVIDER_SITE_OTHER): Payer: Federal, State, Local not specified - PPO | Admitting: Family Medicine

## 2022-05-24 VITALS — BP 137/72 | HR 90 | Ht 65.0 in | Wt 178.0 lb

## 2022-05-24 DIAGNOSIS — R0602 Shortness of breath: Secondary | ICD-10-CM

## 2022-05-24 DIAGNOSIS — J439 Emphysema, unspecified: Secondary | ICD-10-CM | POA: Diagnosis not present

## 2022-05-24 DIAGNOSIS — K588 Other irritable bowel syndrome: Secondary | ICD-10-CM

## 2022-05-24 DIAGNOSIS — R635 Abnormal weight gain: Secondary | ICD-10-CM | POA: Diagnosis not present

## 2022-05-24 MED ORDER — ALBUTEROL SULFATE HFA 108 (90 BASE) MCG/ACT IN AERS: 1.0000 | INHALATION_SPRAY | RESPIRATORY_TRACT | 2 refills | Status: DC | PRN

## 2022-05-24 MED ORDER — PROMETHAZINE HCL 12.5 MG PO TABS: 12.5000 mg | ORAL_TABLET | ORAL | 1 refills | Status: DC | PRN

## 2022-05-24 NOTE — Assessment & Plan Note (Signed)
Dr. Glennon Hamilton did end up taking her off of Linzess recently she just was not getting good consistent results.  There can try MiraLAX as well as milk of magnesia combination and see if that is helpful.

## 2022-05-24 NOTE — Assessment & Plan Note (Signed)
A little more shortness of breath yesterday but actually feels better today.  I really like to have her come back and do some updated spirometry and looking back it was June 2022 since she was last evaluated.  I think getting that up-to-date to see if things are stable or if they have progressed would be really helpful.  Though she does feel like the bloating, weight gain over the last months and stress are contributing to some of her shortness of breath.

## 2022-05-24 NOTE — Progress Notes (Signed)
Established Patient Office Visit  Subjective   Patient ID: Tina Navarro, female    DOB: 04-25-1960  Age: 62 y.o. MRN: [id number]  Chief Complaint  Patient presents with   COPD    Pt reports that she had an exacerbation that came on yesterday. She reports that this was caused by stress. She also states that when this happens her stomach swells    HPI   Pt reports that she had an COPD exacerbation that came on yesterday.  Says yesterday she feels like she could not have walked around the parking lot but today she actually feels a little bit better.  She says she will have these episodes where she will feel more short of breath.  They do not necessarily last all day can sometimes just be for few hours and then she feels better.  But she definitely feels like stress tends to trigger this sensation.  She has not had any cough or mucus production.  She reports that this was caused by stress. She also states that when this happens her stomach swells. Using her Flovent occasionally but not always daily.  Last chest CT was in May 2023 showed, 10 months ago     To discuss her weight.  She is gained about 45 to 50 pounds since quitting smoking and she feels like that is affecting her energy levels as well as her shortness of breath as well.  Is also still not sleeping well.  Her dog tends to get up and down all night.    ROS    Objective:     BP 137/72   Pulse 90   Ht 5\' 5"  (1.651 m)   Wt 178 lb (80.7 kg)   LMP  (LMP Unknown)   SpO2 99%   BMI 29.62 kg/m    Physical Exam Vitals and nursing note reviewed.  Constitutional:      Appearance: She is well-developed.  HENT:     Head: Normocephalic and atraumatic.  Cardiovascular:     Rate and Rhythm: Normal rate and regular rhythm.     Heart sounds: Normal heart sounds.  Pulmonary:     Effort: Pulmonary effort is normal.     Breath sounds: Normal breath sounds.  Skin:    General: Skin is warm and dry.  Neurological:      Mental Status: She is alert and oriented to person, place, and time.  Psychiatric:        Behavior: Behavior normal.      No results found for any visits on 05/24/22.    The ASCVD Risk score (Arnett DK, et al., 2019) failed to calculate for the following reasons:   The valid total cholesterol range is 130 to 320 mg/dL    Assessment & Plan:   Problem List Items Addressed This Visit       Respiratory   Emphysematous COPD ([place]) - Primary    A little more shortness of breath yesterday but actually feels better today.  I really like to have her come back and do some updated spirometry and looking back it was June 2022 since she was last evaluated.  I think getting that up-to-date to see if things are stable or if they have progressed would be really helpful.  Though she does feel like the bloating, weight gain over the last months and stress are contributing to some of her shortness of breath.      Relevant Medications   promethazine (PHENERGAN) 12.5 MG tablet  albuterol (VENTOLIN HFA) 108 (90 Base) MCG/ACT inhaler   Other Relevant Orders   COMPLETE METABOLIC PANEL WITH GFR   CBC with Differential/Platelet     Digestive   Irritable bowel syndrome    Dr. Glennon Hamilton did end up taking her off of Linzess recently she just was not getting good consistent results.  There can try MiraLAX as well as milk of magnesia combination and see if that is helpful.      Relevant Medications   promethazine (PHENERGAN) 12.5 MG tablet   Other Visit Diagnoses     Weight gain       Relevant Orders   Amb Ref to Medical Weight Management   SOB (shortness of breath)       Relevant Orders   COMPLETE METABOLIC PANEL WITH GFR   CBC with Differential/Platelet       Weight gain again has gained 45 to 50 pounds since quitting smoking we discussed options.  Because of her GI issues I am hesitant to put her on a GLP-1 and with her cardiac issues I would prefer to stay away from stimulant.  We did discuss  that we have a great program here locally through [facility] I think she would be a great candidate.  Formal consult requested and referral placed today.  Shortness of breath-she does typically have a mild anemia the last time we checked a CBC was almost a year ago so would like to get that up-to-date.  Return in about 3 weeks (around 06/14/2022) for Spirometry  .    Beatrice Lecher, MD

## 2022-05-31 DIAGNOSIS — Z483 Aftercare following surgery for neoplasm: Secondary | ICD-10-CM | POA: Diagnosis not present

## 2022-05-31 DIAGNOSIS — L910 Hypertrophic scar: Secondary | ICD-10-CM | POA: Diagnosis not present

## 2022-06-19 ENCOUNTER — Ambulatory Visit (INDEPENDENT_AMBULATORY_CARE_PROVIDER_SITE_OTHER): Payer: Federal, State, Local not specified - PPO | Admitting: Family Medicine

## 2022-06-19 VITALS — BP 125/58

## 2022-06-19 DIAGNOSIS — R0602 Shortness of breath: Secondary | ICD-10-CM

## 2022-06-19 DIAGNOSIS — J439 Emphysema, unspecified: Secondary | ICD-10-CM

## 2022-06-19 MED ORDER — ALBUTEROL SULFATE (2.5 MG/3ML) 0.083% IN NEBU: 2.5000 mg | INHALATION_SOLUTION | Freq: Once | RESPIRATORY_TRACT | Status: DC

## 2022-06-19 MED ORDER — STIOLTO RESPIMAT 2.5-2.5 MCG/ACT IN AERS: 2.0000 | INHALATION_SPRAY | Freq: Every morning | RESPIRATORY_TRACT | 1 refills | Status: DC

## 2022-06-19 NOTE — Assessment & Plan Note (Signed)
Tina Navarro 06/19/22: FVC 93%, FEV1 66% with a ratio of 54% consistent with moderate obstruction.  17% improvement in FEV1 after albuterol.  We reviewed the results together.  She actually rarely uses her albuterol for rescue.  But being that her FEV1 is 66% she would benefit from an anticholinergic.  It looks like Stiolto would be covered on her insurance so we will start with that to be used daily and still continue with as needed albuterol.  Recommend repeat spirometry in 1 year.  She is a former smoker.

## 2022-06-19 NOTE — Progress Notes (Unsigned)
   Established Patient Office Visit  Subjective   Patient ID: Tina Navarro, female    DOB: 1960-05-11  Age: 62 y.o. MRN: [id number]  Chief Complaint  Patient presents with   Shortness of Breath    Spirometry  nurse visit - patient with history of pulmonary emphysema. Patient has NOT used home albuterol before appointment today.     HPI  Spirometry nurse visit - patient with history of pulmonary emphysema. Patient has NOT used home albuterol before appointment today.  She has not noticed any triggers such as activity for shortness of breath or being around strong smells or burning smoke etc.  She notices that her peak flows dropped more when she is feeling anxious or there is a lot going on.  She did bring in a log where she has been tracking them.     ROS    Objective:     BP (!) 125/58 (BP Location: Left Arm, Patient Position: Sitting, Cuff Size: Normal) Comment (BP Location): right arm = 125/58 sitting- normal cuff  LMP  (LMP Unknown)    Physical Exam Vitals and nursing note reviewed.  Constitutional:      Appearance: She is well-developed.  HENT:     Head: Normocephalic and atraumatic.  Cardiovascular:     Rate and Rhythm: Normal rate and regular rhythm.     Heart sounds: Normal heart sounds.  Pulmonary:     Effort: Pulmonary effort is normal.     Breath sounds: Normal breath sounds.  Skin:    General: Skin is warm and dry.  Neurological:     Mental Status: She is alert and oriented to person, place, and time.  Psychiatric:        Behavior: Behavior normal.      No results found for any visits on 06/19/22.    The ASCVD Risk score (Arnett DK, et al., 2019) failed to calculate for the following reasons:   The valid total cholesterol range is 130 to 320 mg/dL    Assessment & Plan:   Problem List Items Addressed This Visit       Respiratory   Emphysematous COPD    Cleda Daub 06/19/22: FVC 93%, FEV1 66% with a ratio of 54% consistent with moderate  obstruction.  17% improvement in FEV1 after albuterol.  We reviewed the results together.  She actually rarely uses her albuterol for rescue.  But being that her FEV1 is 66% she would benefit from an anticholinergic.  It looks like Stiolto would be covered on her insurance so we will start with that to be used daily and still continue with as needed albuterol.  Recommend repeat spirometry in 1 year.  She is a former smoker.      Relevant Medications   albuterol (PROVENTIL) (2.5 MG/3ML) 0.083% nebulizer solution 2.5 mg   Tiotropium Bromide-Olodaterol (STIOLTO RESPIMAT) 2.5-2.5 MCG/ACT AERS   Other Visit Diagnoses     SOB (shortness of breath)    -  Primary   Relevant Medications   albuterol (PROVENTIL) (2.5 MG/3ML) 0.083% nebulizer solution 2.5 mg   Other Relevant Orders   PR BRNCDILAT RSPSE SPMTRY PRE&POST-BRNCDILAT ADMN       Return in about 6 months (around 12/19/2022).    Nani Gasser, MD

## 2022-06-20 DIAGNOSIS — I671 Cerebral aneurysm, nonruptured: Secondary | ICD-10-CM | POA: Diagnosis not present

## 2022-07-10 IMAGING — CT CT CHEST W/O CM
2 of 4 series · 15 of 36 positions shown, 18 images · non-contrast
Comparison: Previous studies including chest radiographs done on
07/05/2021 and CT done on 02/12/2020

CLINICAL DATA: Shortness of breath



[Series 2: thorax · axial · 0.67mm/px · z∈[-298,-48]mm · 12 of 149 slices shown, 15 images]
[im 12/149  mediastinal]
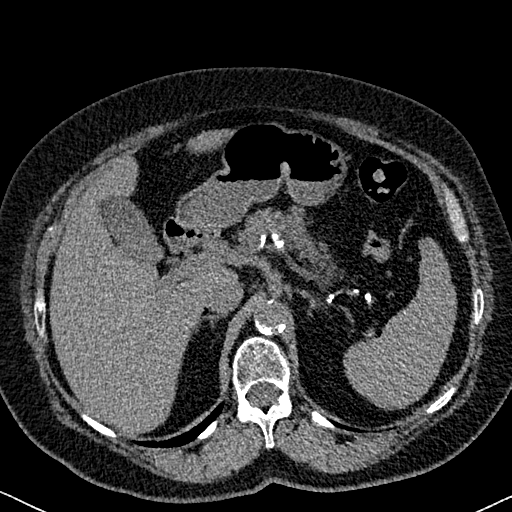
[im 12/149  lung]
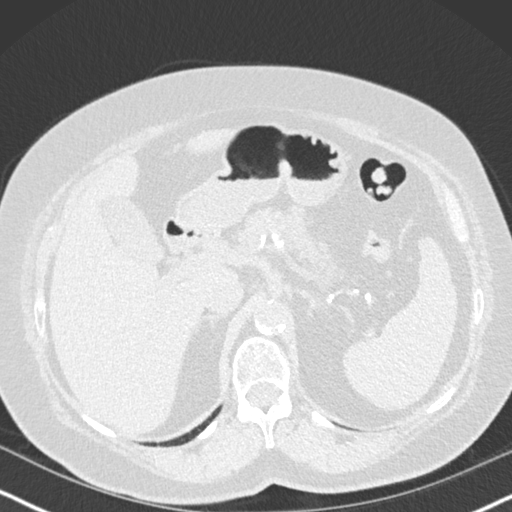
[im 23/149  lung]
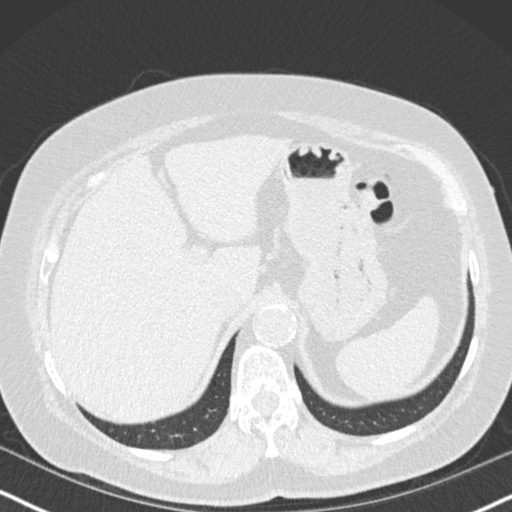
[im 35/149  lung]
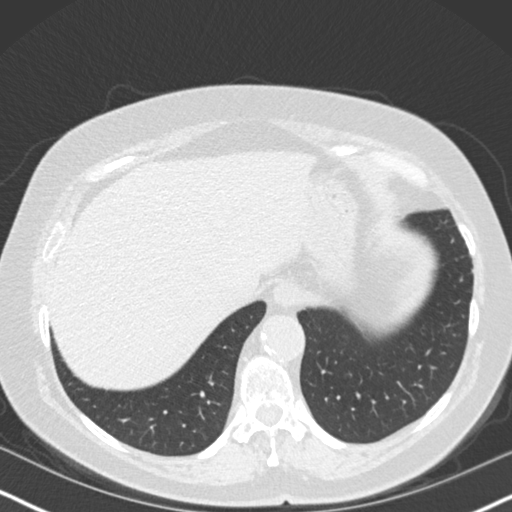
[im 46/149  lung]
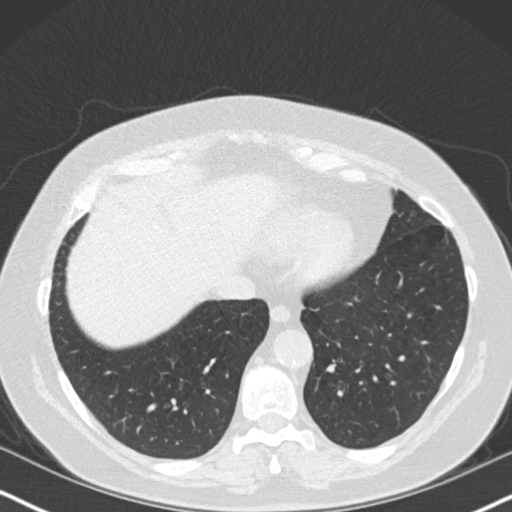
[im 57/149  mediastinal]
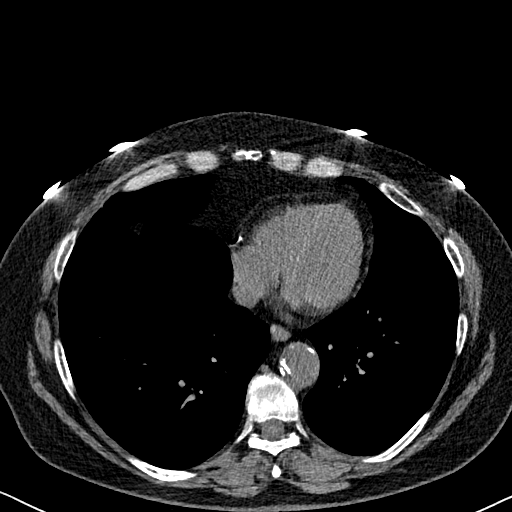
[im 57/149  lung]
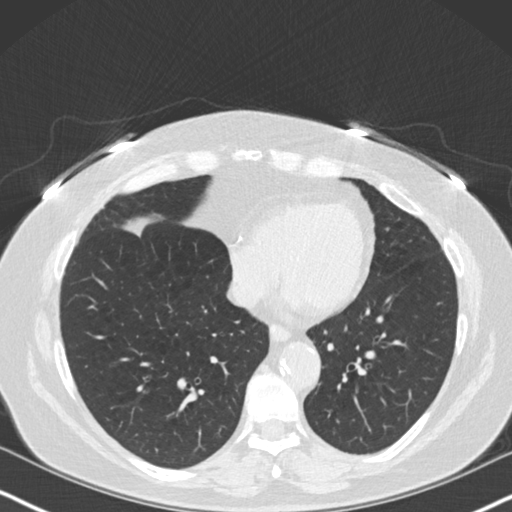
[im 69/149  lung]
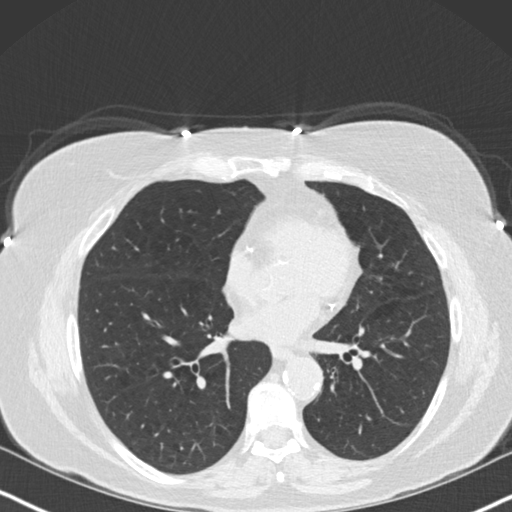
[im 80/149  lung]
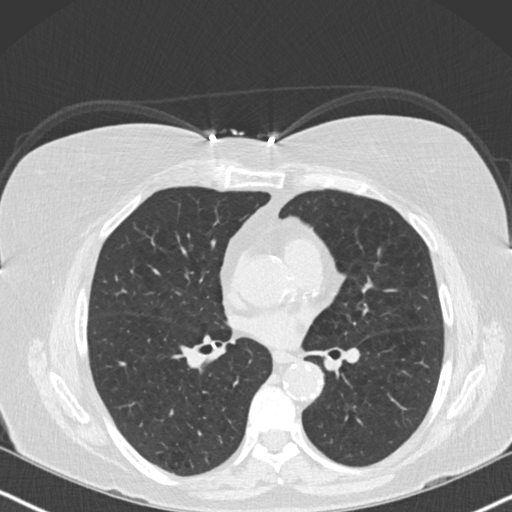
[im 92/149  lung]
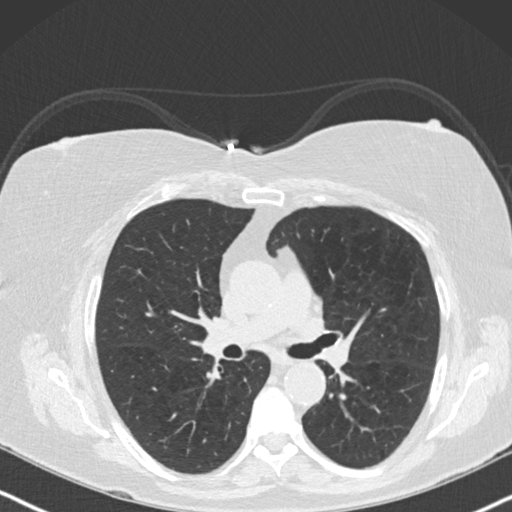
[im 103/149  mediastinal]
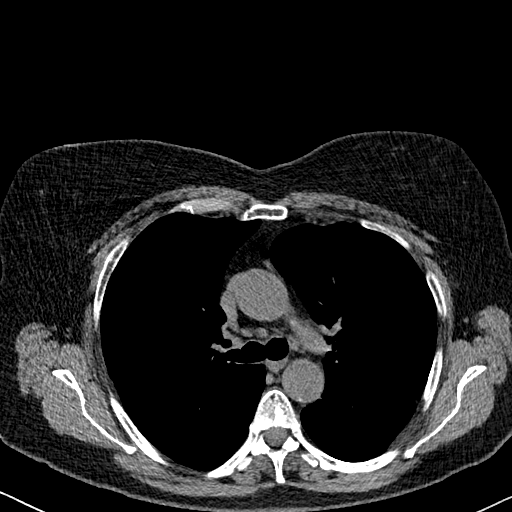
[im 103/149  lung]
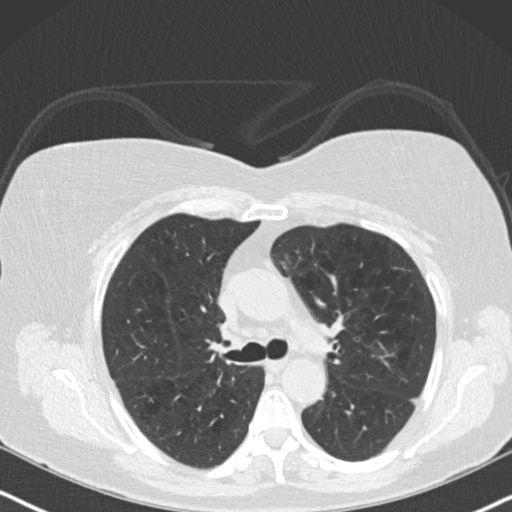
[im 114/149  lung]
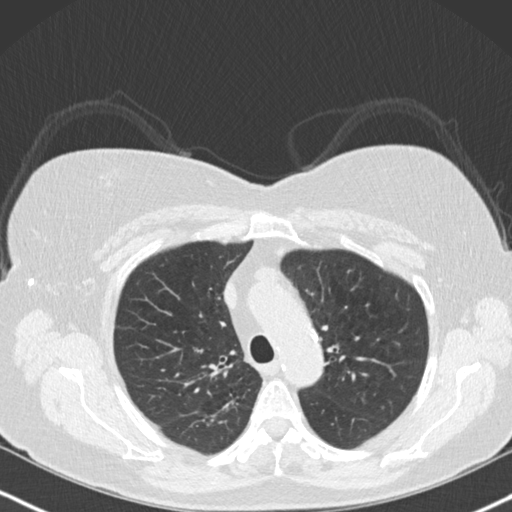
[im 126/149  lung]
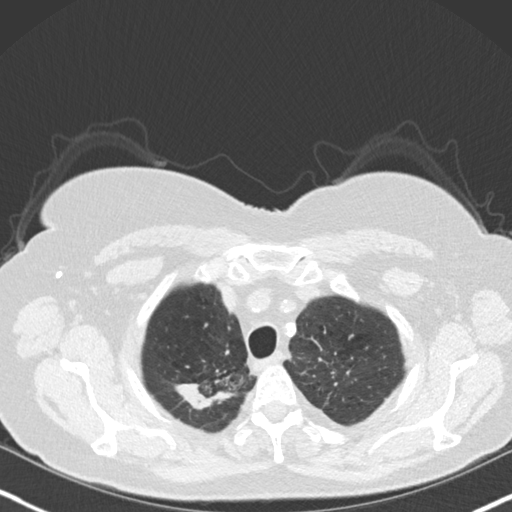
[im 137/149  lung]
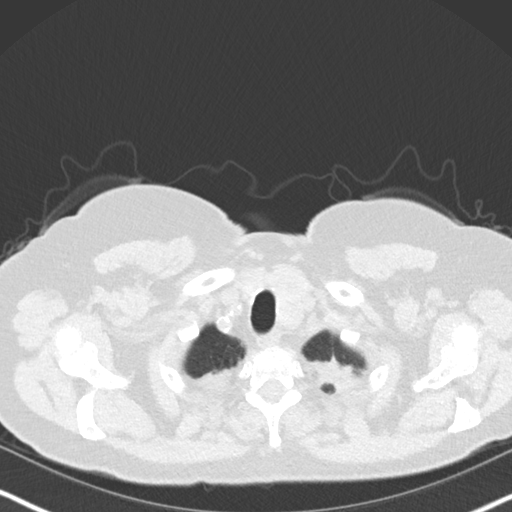

[Series 5: coronal · coronal · 0.59mm/px · 3 of 116 slices shown]
[im 24/116  lung]
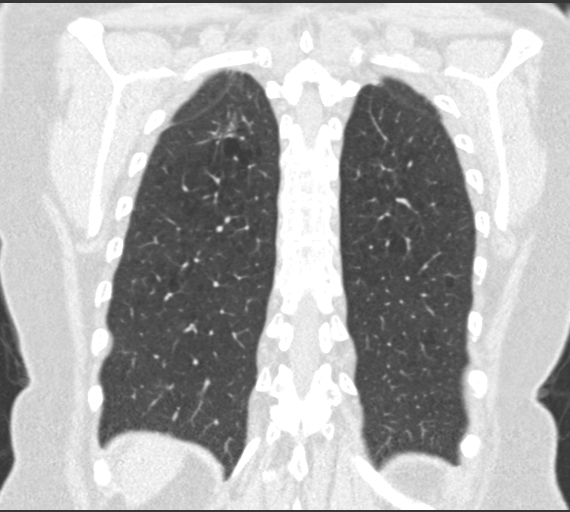
[im 47/116  lung]
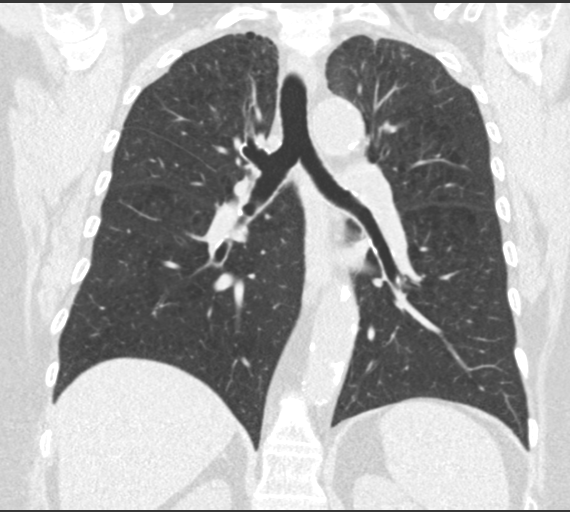
[im 70/116  lung]
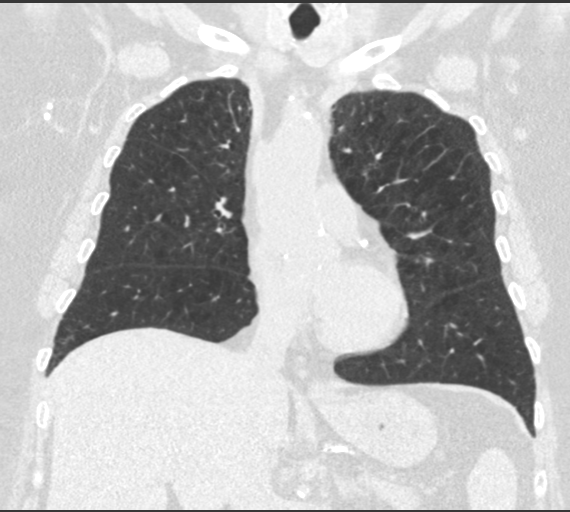

[15 of 36 positions shown; findings below may reference images not displayed]

FINDINGS: Cardiovascular: Coronary artery calcifications are seen. Extensive
calcifications are seen in the thoracic aorta and its major
branches. There is suggestion of high-grade stenosis or occlusion in
the proximal left subclavian artery. There is ectasia of ascending
thoracic aorta measuring 3.5 cm.

Mediastinum/Nodes: There are slightly enlarged lymph nodes in the
mediastinum with no significant interval change. There is
inhomogeneous attenuation in the thyroid. Left lobe of thyroid is
larger. These findings have not changed significantly.

Lungs/Pleura: There is apical pleural thickening, more so on the
right side with no significant interval change. Centrilobular
emphysema is seen. No new focal infiltrates are seen. No new
discrete lung nodules are noted. There is no pleural effusion or
pneumothorax.

Upper Abdomen: Extensive calcifications are noted in the visualized
portions of abdominal aorta and its major branches.

Musculoskeletal: No acute findings are seen.
IMPRESSION: Significant atherosclerotic changes are noted with calcifications in
the thoracic aorta and its major branches. Coronary artery
calcifications are seen. There is possible high-grade stenosis or
occlusion in the proximal left subclavian artery.

COPD. There are no new infiltrates. There is no pleural effusion or
pneumothorax.

Other findings as described in the body of the report.

## 2022-07-13 ENCOUNTER — Ambulatory Visit (INDEPENDENT_AMBULATORY_CARE_PROVIDER_SITE_OTHER): Payer: Federal, State, Local not specified - PPO | Admitting: Family Medicine

## 2022-07-13 ENCOUNTER — Encounter: Payer: Self-pay | Admitting: Family Medicine

## 2022-07-13 VITALS — BP 114/51 | HR 88 | Temp 98.5°F | Ht 65.0 in | Wt 179.0 lb

## 2022-07-13 DIAGNOSIS — R051 Acute cough: Secondary | ICD-10-CM | POA: Diagnosis not present

## 2022-07-13 LAB — POC COVID19 BINAXNOW: SARS Coronavirus 2 Ag: NEGATIVE

## 2022-07-13 MED ORDER — AZITHROMYCIN 250 MG PO TABS: ORAL_TABLET | ORAL | 0 refills | Status: AC

## 2022-07-13 MED ORDER — HYDROCODONE BIT-HOMATROP MBR 5-1.5 MG/5ML PO SOLN: 5.0000 mL | Freq: Three times a day (TID) | ORAL | 0 refills | Status: DC | PRN

## 2022-07-13 NOTE — Progress Notes (Signed)
   Acute Office Visit  Subjective:     Patient ID: Tina Navarro, female    DOB: 08-Feb-1961, 62 y.o.   MRN: [id number]  Chief Complaint  Patient presents with   Cough   chest congestion   post nasal drip    HPI Patient is in today for Cough, chest congestion, and post nasal drip x 5 days.  Eyes and throat feel itchy and some burning.  No fever or chills. No bodyaches.   ROS      Objective:    BP (!) 114/51   Pulse 88   Temp 98.5 F (36.9 C)   Ht 5\' 5"  (1.651 m)   Wt 179 lb (81.2 kg)   LMP  (LMP Unknown)   SpO2 100%   BMI 29.79 kg/m    Physical Exam Constitutional:      Appearance: She is well-developed.  HENT:     Head: Normocephalic and atraumatic.     Right Ear: External ear normal.     Left Ear: External ear normal.     Nose: Nose normal.  Eyes:     Conjunctiva/sclera: Conjunctivae normal.     Pupils: Pupils are equal, round, and reactive to light.  Neck:     Thyroid: No thyromegaly.  Cardiovascular:     Rate and Rhythm: Normal rate and regular rhythm.     Heart sounds: Normal heart sounds.  Pulmonary:     Effort: Pulmonary effort is normal.     Breath sounds: Normal breath sounds. No wheezing.  Musculoskeletal:     Cervical back: Neck supple.  Lymphadenopathy:     Cervical: No cervical adenopathy.  Skin:    General: Skin is warm and dry.  Neurological:     Mental Status: She is alert and oriented to person, place, and time.     Results for orders placed or performed in visit on 07/13/22  POC COVID-19  Result Value Ref Range   SARS Coronavirus 2 Ag Negative Negative        Assessment & Plan:   Problem List Items Addressed This Visit   None Visit Diagnoses     Acute cough    -  Primary   Relevant Medications   HYDROcodone bit-homatropine (HYCODAN) 5-1.5 MG/5ML syrup   Other Relevant Orders   POC COVID-19 (Completed)      Acute cough-most consistent with upper respiratory infection and possible COPD exacerbation.  At this point  we will going to treat with the cough syrup and symptomatic care but if she feels like she is getting worse over the weekend then okay to go ahead and start the azithromycin.  We can add prednisone if she feels like she is becoming more short of breath.  Meds ordered this encounter  Medications   HYDROcodone bit-homatropine (HYCODAN) 5-1.5 MG/5ML syrup    Sig: Take 5 mLs by mouth every 8 (eight) hours as needed for cough.    Dispense:  120 mL    Refill:  0   azithromycin (ZITHROMAX) 250 MG tablet    Sig: 2 Ttabs PO on Day 1, then one a day x 4 days.    Dispense:  6 tablet    Refill:  0    No follow-ups on file.  Nani Gasser, MD

## 2022-07-13 NOTE — Progress Notes (Signed)
Pt reports that her sxs started 4 days ago. She has cough,chest congestion, post nasal drip, itchy eyes, throat and ears. She has been taking Mucinex, Sudafed, Zyrtec.   She denies any f/s/c or body aches. She does have some frontal headaches she has been taking Tylenol every 4-5 hours for this. The mucus has been tan in color and her cough is worse at night.

## 2022-07-16 DIAGNOSIS — I671 Cerebral aneurysm, nonruptured: Secondary | ICD-10-CM | POA: Diagnosis not present

## 2022-08-21 DIAGNOSIS — D123 Benign neoplasm of transverse colon: Secondary | ICD-10-CM | POA: Diagnosis not present

## 2022-08-21 DIAGNOSIS — Z1211 Encounter for screening for malignant neoplasm of colon: Secondary | ICD-10-CM | POA: Diagnosis not present

## 2022-08-21 DIAGNOSIS — F419 Anxiety disorder, unspecified: Secondary | ICD-10-CM | POA: Diagnosis not present

## 2022-08-21 DIAGNOSIS — D122 Benign neoplasm of ascending colon: Secondary | ICD-10-CM | POA: Diagnosis not present

## 2022-08-21 LAB — HM COLONOSCOPY

## 2022-08-24 ENCOUNTER — Other Ambulatory Visit: Payer: Self-pay | Admitting: Cardiology

## 2022-08-24 ENCOUNTER — Other Ambulatory Visit: Payer: Self-pay | Admitting: Family Medicine

## 2022-08-24 DIAGNOSIS — R072 Precordial pain: Secondary | ICD-10-CM

## 2022-08-24 DIAGNOSIS — K219 Gastro-esophageal reflux disease without esophagitis: Secondary | ICD-10-CM

## 2022-08-28 DIAGNOSIS — E663 Overweight: Secondary | ICD-10-CM | POA: Diagnosis not present

## 2022-08-28 DIAGNOSIS — Z6829 Body mass index (BMI) 29.0-29.9, adult: Secondary | ICD-10-CM | POA: Diagnosis not present

## 2022-08-28 DIAGNOSIS — Z131 Encounter for screening for diabetes mellitus: Secondary | ICD-10-CM | POA: Diagnosis not present

## 2022-08-28 DIAGNOSIS — E785 Hyperlipidemia, unspecified: Secondary | ICD-10-CM | POA: Diagnosis not present

## 2022-08-28 DIAGNOSIS — R5383 Other fatigue: Secondary | ICD-10-CM | POA: Diagnosis not present

## 2022-08-28 DIAGNOSIS — Z1331 Encounter for screening for depression: Secondary | ICD-10-CM | POA: Diagnosis not present

## 2022-08-28 DIAGNOSIS — Z13228 Encounter for screening for other metabolic disorders: Secondary | ICD-10-CM | POA: Diagnosis not present

## 2022-08-29 ENCOUNTER — Ambulatory Visit (INDEPENDENT_AMBULATORY_CARE_PROVIDER_SITE_OTHER): Payer: Federal, State, Local not specified - PPO | Admitting: Family Medicine

## 2022-08-29 ENCOUNTER — Encounter: Payer: Self-pay | Admitting: Family Medicine

## 2022-08-29 VITALS — BP 120/69 | HR 84 | Ht 65.0 in | Wt 177.0 lb

## 2022-08-29 DIAGNOSIS — G4733 Obstructive sleep apnea (adult) (pediatric): Secondary | ICD-10-CM | POA: Diagnosis not present

## 2022-08-29 DIAGNOSIS — R748 Abnormal levels of other serum enzymes: Secondary | ICD-10-CM

## 2022-08-29 DIAGNOSIS — E559 Vitamin D deficiency, unspecified: Secondary | ICD-10-CM | POA: Diagnosis not present

## 2022-08-29 DIAGNOSIS — R7301 Impaired fasting glucose: Secondary | ICD-10-CM

## 2022-08-29 NOTE — Assessment & Plan Note (Signed)
If we can find her sleep study from 11/2022 we can move forward with getting new equipment. If not recommend Home sleep study to document and then will set for CPAP to better determine pressure settings.

## 2022-08-29 NOTE — Assessment & Plan Note (Addendum)
Labs show A1C 5.7 on labs. Reviewed work on diet and exercise. Plan to recheck in 3months.

## 2022-08-29 NOTE — Progress Notes (Signed)
   Established Patient Office Visit  Subjective   Patient ID: Tina Navarro, female    DOB: August 14, 1960  Age: 62 y.o. MRN: [id number]  Chief Complaint  Patient presents with   Follow-up    Pt was seen by [facility] and had labwork done and was told that she needed a Sleep study she would like to discuss this with Dr. Linford Arnold before proceeding      HPI  She was dx with sleep apnea about 10 yr ago and wore CPAP for a coupoe of years.  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 Eventually ran out of supplies   Prior note from Dr. Ralph Leyden: OSA AHI 10.9, sleep study October 2014. We'll start auto CPAP 5-15 orders sent over to a DME.     ROS    Objective:     BP 120/69   Pulse 84   Ht 5\' 5"  (1.651 m)   Wt 177 lb (80.3 kg)   LMP  (LMP Unknown)   SpO2 98%   BMI 29.45 kg/m    Physical Exam Vitals reviewed.  Constitutional:      Appearance: She is well-developed.  HENT:     Head: Normocephalic and atraumatic.  Eyes:     Conjunctiva/sclera: Conjunctivae normal.  Cardiovascular:     Rate and Rhythm: Normal rate.  Pulmonary:     Effort: Pulmonary effort is normal.  Skin:    General: Skin is dry.     Coloration: Skin is not pale.  Neurological:     Mental Status: She is alert and oriented to person, place, and time.  Psychiatric:        Behavior: Behavior normal.      No results found for any visits on 08/29/22.    The 10-year ASCVD risk score (Arnett DK, et al., 2019) is: 4.2%    Assessment & Plan:   Problem List Items Addressed This Visit       Respiratory   OSA (obstructive sleep apnea) - Primary    If we can find her sleep study from 11/2022 we can move forward with getting new equipment. If not recommend Home sleep study to document and then will set for CPAP to better determine pressure settings.        Relevant Orders   Home sleep test     Endocrine   IFG (impaired fasting glucose)    Labs show A1C 5.7 on labs. Reviewed work on diet and  exercise. Plan to recheck in 3months.       Other Visit Diagnoses     Vitamin D deficiency       Elevated alkaline phosphatase level          Vit d - plan to recheck in 3 months. Double up to 2000 international daily.  Elevated alk phos - plan to recheck in 3 mo. Additional liver labs are normal.   Note: Did search of the chart and we found notes from Dr. Su Monks as who had originally ordered and managed her sleep apnea.  But unfortunately we could not find the actual study to submit to insurance.  So we will start by getting a home sleep study and then setting her to [employer].  Return in about 3 months (around 11/29/2022) for follow up labs and sleep apnea .    Nani Gasser, MD

## 2022-08-31 ENCOUNTER — Telehealth: Payer: Self-pay | Admitting: Family Medicine

## 2022-08-31 DIAGNOSIS — G4733 Obstructive sleep apnea (adult) (pediatric): Secondary | ICD-10-CM

## 2022-08-31 NOTE — Telephone Encounter (Signed)
Patient advised and sleep study ordered.

## 2022-08-31 NOTE — Telephone Encounter (Signed)
Call patient and let her know that we were able to find the notes from Dr. Su Monks has that talked about her sleep study but we were never able to actually find the report itself.  So we will have to move forward with a home sleep study.

## 2022-09-08 ENCOUNTER — Other Ambulatory Visit: Payer: Self-pay | Admitting: Family Medicine

## 2022-09-10 DIAGNOSIS — Z85828 Personal history of other malignant neoplasm of skin: Secondary | ICD-10-CM | POA: Diagnosis not present

## 2022-09-10 DIAGNOSIS — L821 Other seborrheic keratosis: Secondary | ICD-10-CM | POA: Diagnosis not present

## 2022-09-10 DIAGNOSIS — D2261 Melanocytic nevi of right upper limb, including shoulder: Secondary | ICD-10-CM | POA: Diagnosis not present

## 2022-09-10 DIAGNOSIS — L814 Other melanin hyperpigmentation: Secondary | ICD-10-CM | POA: Diagnosis not present

## 2022-09-11 DIAGNOSIS — R92313 Mammographic fatty tissue density, bilateral breasts: Secondary | ICD-10-CM | POA: Diagnosis not present

## 2022-09-11 DIAGNOSIS — K219 Gastro-esophageal reflux disease without esophagitis: Secondary | ICD-10-CM | POA: Diagnosis not present

## 2022-09-11 DIAGNOSIS — K5790 Diverticulosis of intestine, part unspecified, without perforation or abscess without bleeding: Secondary | ICD-10-CM | POA: Diagnosis not present

## 2022-09-11 DIAGNOSIS — Z780 Asymptomatic menopausal state: Secondary | ICD-10-CM | POA: Diagnosis not present

## 2022-09-11 DIAGNOSIS — N952 Postmenopausal atrophic vaginitis: Secondary | ICD-10-CM | POA: Diagnosis not present

## 2022-09-11 DIAGNOSIS — K581 Irritable bowel syndrome with constipation: Secondary | ICD-10-CM | POA: Diagnosis not present

## 2022-09-11 DIAGNOSIS — Z13820 Encounter for screening for osteoporosis: Secondary | ICD-10-CM | POA: Diagnosis not present

## 2022-09-11 DIAGNOSIS — Z01419 Encounter for gynecological examination (general) (routine) without abnormal findings: Secondary | ICD-10-CM | POA: Diagnosis not present

## 2022-09-11 DIAGNOSIS — Z1231 Encounter for screening mammogram for malignant neoplasm of breast: Secondary | ICD-10-CM | POA: Diagnosis not present

## 2022-09-11 DIAGNOSIS — Z86010 Personal history of colonic polyps: Secondary | ICD-10-CM | POA: Diagnosis not present

## 2022-09-19 ENCOUNTER — Other Ambulatory Visit: Payer: Self-pay | Admitting: Family Medicine

## 2022-09-19 DIAGNOSIS — R921 Mammographic calcification found on diagnostic imaging of breast: Secondary | ICD-10-CM | POA: Diagnosis not present

## 2022-09-19 DIAGNOSIS — R92322 Mammographic fibroglandular density, left breast: Secondary | ICD-10-CM | POA: Diagnosis not present

## 2022-09-19 DIAGNOSIS — K588 Other irritable bowel syndrome: Secondary | ICD-10-CM

## 2022-09-25 DIAGNOSIS — E663 Overweight: Secondary | ICD-10-CM | POA: Diagnosis not present

## 2022-09-25 DIAGNOSIS — Z6829 Body mass index (BMI) 29.0-29.9, adult: Secondary | ICD-10-CM | POA: Diagnosis not present

## 2022-09-25 DIAGNOSIS — Z713 Dietary counseling and surveillance: Secondary | ICD-10-CM | POA: Diagnosis not present

## 2022-09-25 DIAGNOSIS — I10 Essential (primary) hypertension: Secondary | ICD-10-CM | POA: Diagnosis not present

## 2022-09-26 ENCOUNTER — Other Ambulatory Visit: Payer: Self-pay | Admitting: *Deleted

## 2022-09-26 ENCOUNTER — Other Ambulatory Visit: Payer: Self-pay | Admitting: Family Medicine

## 2022-09-26 DIAGNOSIS — I739 Peripheral vascular disease, unspecified: Secondary | ICD-10-CM

## 2022-09-26 DIAGNOSIS — I70213 Atherosclerosis of native arteries of extremities with intermittent claudication, bilateral legs: Secondary | ICD-10-CM

## 2022-09-26 DIAGNOSIS — I6523 Occlusion and stenosis of bilateral carotid arteries: Secondary | ICD-10-CM

## 2022-09-27 ENCOUNTER — Encounter: Payer: Self-pay | Admitting: Family Medicine

## 2022-10-09 DIAGNOSIS — Z713 Dietary counseling and surveillance: Secondary | ICD-10-CM | POA: Diagnosis not present

## 2022-10-09 DIAGNOSIS — E663 Overweight: Secondary | ICD-10-CM | POA: Diagnosis not present

## 2022-10-09 DIAGNOSIS — I10 Essential (primary) hypertension: Secondary | ICD-10-CM | POA: Diagnosis not present

## 2022-10-09 DIAGNOSIS — Z6829 Body mass index (BMI) 29.0-29.9, adult: Secondary | ICD-10-CM | POA: Diagnosis not present

## 2022-10-10 DIAGNOSIS — E042 Nontoxic multinodular goiter: Secondary | ICD-10-CM | POA: Diagnosis not present

## 2022-10-16 ENCOUNTER — Ambulatory Visit (INDEPENDENT_AMBULATORY_CARE_PROVIDER_SITE_OTHER): Payer: Federal, State, Local not specified - PPO | Admitting: Family Medicine

## 2022-10-16 ENCOUNTER — Encounter: Payer: Self-pay | Admitting: Family Medicine

## 2022-10-16 VITALS — BP 106/68 | HR 96 | Temp 98.5°F | Wt 176.0 lb

## 2022-10-16 DIAGNOSIS — U071 COVID-19: Secondary | ICD-10-CM

## 2022-10-16 DIAGNOSIS — R0981 Nasal congestion: Secondary | ICD-10-CM

## 2022-10-16 DIAGNOSIS — R051 Acute cough: Secondary | ICD-10-CM

## 2022-10-16 HISTORY — DX: COVID-19: U07.1

## 2022-10-16 LAB — POCT INFLUENZA A/B
Influenza A, POC: NEGATIVE
Influenza B, POC: NEGATIVE

## 2022-10-16 LAB — POC COVID19 BINAXNOW: SARS Coronavirus 2 Ag: POSITIVE — AB

## 2022-10-16 MED ORDER — NIRMATRELVIR/RITONAVIR (PAXLOVID)TABLET: 3.0000 | ORAL_TABLET | Freq: Two times a day (BID) | ORAL | 0 refills | Status: AC

## 2022-10-16 MED ORDER — HYDROCOD POLI-CHLORPHE POLI ER 10-8 MG/5ML PO SUER: 5.0000 mL | Freq: Two times a day (BID) | ORAL | 0 refills | Status: DC | PRN

## 2022-10-16 NOTE — Assessment & Plan Note (Signed)
Patient presents for cough and congestion. Poc flu and covid done. Covid positive.  Recommended paxlovid and tussinex for cough symptoms. Pt agreeable. Recommended supportive therapy-rest, fluids, vit c and d and zinc - recommend tylenol for body aches and headaches

## 2022-10-16 NOTE — Progress Notes (Signed)
Acute Office Visit  Subjective:     Patient ID: Tina Navarro, female    DOB: 02-Jul-1960, 62 y.o.   MRN: [id number]  Chief Complaint  Patient presents with   Cough   Nasal Congestion    Sick for 1 day    HPI Patient is in today for cough and congestion for 1 day.  She was recently out of town and some meetings.  Her husband has similar symptoms.  Review of Systems  Constitutional:  Negative for chills and fever.  HENT:  Positive for congestion.   Respiratory:  Positive for cough. Negative for shortness of breath.   Cardiovascular:  Negative for chest pain.  Neurological:  Negative for headaches.        Objective:    BP 106/68   Pulse 96   Temp 98.5 F (36.9 C) (Temporal)   Wt 176 lb (79.8 kg)   LMP  (LMP Unknown)   SpO2 97%   BMI 29.29 kg/m    Physical Exam Vitals and nursing note reviewed.  Constitutional:      General: She is not in acute distress.    Appearance: Normal appearance.  HENT:     Head: Normocephalic and atraumatic.     Right Ear: External ear normal.     Left Ear: External ear normal.     Nose: Congestion present.  Eyes:     Conjunctiva/sclera: Conjunctivae normal.  Cardiovascular:     Rate and Rhythm: Normal rate and regular rhythm.  Pulmonary:     Effort: Pulmonary effort is normal.     Breath sounds: Normal breath sounds.  Neurological:     General: No focal deficit present.     Mental Status: She is alert and oriented to person, place, and time.  Psychiatric:        Mood and Affect: Mood normal.        Behavior: Behavior normal.        Thought Content: Thought content normal.        Judgment: Judgment normal.     Results for orders placed or performed in visit on 10/16/22  POC COVID-19  Result Value Ref Range   SARS Coronavirus 2 Ag Positive (A) Negative  POCT Influenza A/B  Result Value Ref Range   Influenza A, POC Negative Negative   Influenza B, POC Negative Negative        Assessment & Plan:   Problem List  Items Addressed This Visit       Other   COVID-19 - Primary    Patient presents for cough and congestion. Poc flu and covid done. Covid positive.  Recommended paxlovid and tussinex for cough symptoms. Pt agreeable. Recommended supportive therapy-rest, fluids, vit c and d and zinc - recommend tylenol for body aches and headaches      Relevant Medications   chlorpheniramine-HYDROcodone (TUSSIONEX) 10-8 MG/5ML   nirmatrelvir/ritonavir (PAXLOVID) 20 x 150 MG & 10 x 100MG  TABS   Other Visit Diagnoses     Nasal congestion       Relevant Orders   POC COVID-19 (Completed)   POCT Influenza A/B (Completed)   Acute cough       Relevant Orders   POC COVID-19 (Completed)   POCT Influenza A/B (Completed)       Meds ordered this encounter  Medications   chlorpheniramine-HYDROcodone (TUSSIONEX) 10-8 MG/5ML    Sig: Take 5 mLs by mouth every 12 (twelve) hours as needed for cough (cough, will cause drowsiness.).  Dispense:  120 mL    Refill:  0   nirmatrelvir/ritonavir (PAXLOVID) 20 x 150 MG & 10 x 100MG  TABS    Sig: Take 3 tablets by mouth 2 (two) times daily for 5 days. (Take nirmatrelvir 150 mg two tablets twice daily for 5 days and ritonavir 100 mg one tablet twice daily for 5 days) Patient GFR is 60    Dispense:  30 tablet    Refill:  0    Return if symptoms worsen or fail to improve.  Charlton Amor, DO

## 2022-10-22 ENCOUNTER — Ambulatory Visit (HOSPITAL_COMMUNITY): Payer: Federal, State, Local not specified - PPO

## 2022-10-22 ENCOUNTER — Ambulatory Visit: Payer: Federal, State, Local not specified - PPO

## 2022-10-30 DIAGNOSIS — Z13820 Encounter for screening for osteoporosis: Secondary | ICD-10-CM | POA: Diagnosis not present

## 2022-10-30 DIAGNOSIS — Z780 Asymptomatic menopausal state: Secondary | ICD-10-CM | POA: Diagnosis not present

## 2022-10-30 DIAGNOSIS — M810 Age-related osteoporosis without current pathological fracture: Secondary | ICD-10-CM | POA: Diagnosis not present

## 2022-12-03 ENCOUNTER — Ambulatory Visit (INDEPENDENT_AMBULATORY_CARE_PROVIDER_SITE_OTHER)
Admission: RE | Admit: 2022-12-03 | Discharge: 2022-12-03 | Disposition: A | Discharge status: Home / Self Care | Payer: Federal, State, Local not specified - PPO | Source: Ambulatory Visit | Attending: Surgery | Admitting: Surgery

## 2022-12-03 ENCOUNTER — Ambulatory Visit (INDEPENDENT_AMBULATORY_CARE_PROVIDER_SITE_OTHER): Payer: Federal, State, Local not specified - PPO | Admitting: Physician Assistant

## 2022-12-03 ENCOUNTER — Ambulatory Visit (HOSPITAL_COMMUNITY)
Admission: RE | Admit: 2022-12-03 | Discharge: 2022-12-03 | Disposition: A | Discharge status: Home / Self Care | Payer: Federal, State, Local not specified - PPO | Source: Ambulatory Visit | Attending: Surgery | Admitting: Surgery

## 2022-12-03 VITALS — BP 125/81 | HR 81 | Temp 97.4°F | Resp 18 | Ht 64.0 in | Wt 176.8 lb

## 2022-12-03 DIAGNOSIS — I6523 Occlusion and stenosis of bilateral carotid arteries: Secondary | ICD-10-CM

## 2022-12-03 DIAGNOSIS — I739 Peripheral vascular disease, unspecified: Secondary | ICD-10-CM

## 2022-12-03 DIAGNOSIS — I70213 Atherosclerosis of native arteries of extremities with intermittent claudication, bilateral legs: Secondary | ICD-10-CM

## 2022-12-03 DIAGNOSIS — I708 Atherosclerosis of other arteries: Secondary | ICD-10-CM | POA: Diagnosis not present

## 2022-12-03 LAB — VAS US ABI WITH/WO TBI
Left ABI: 1.03
Right ABI: 1.11

## 2022-12-03 NOTE — Progress Notes (Signed)
Office Note     CC:  follow up Requesting Provider:  Agapito Games, *  HPI: Tina Navarro is a 62 y.o. (24-Apr-1960) female who presents for surveillance of carotid subclavian bypass as well as for PAD.  Surgical history significant for iliac intervention at outside location in 2007.  She then underwent angioplasty of bilateral common iliac arteries by Dr. Myra Gianotti on 07/16/2017.  Subsequently she required bilateral iliac stenting by Dr. Myra Gianotti on 12/02/2018.  She then had left carotid subclavian bypass graft by Dr. Myra Gianotti on 02/22/2021.  She has no complaints during office visit today.  She denies claudication symptoms.  She is also without rest pain or tissue loss of bilateral lower extremities.  She denies any strokelike symptoms including slurring speech, changes in vision, or one-sided weakness.  She also is without any left arm weakness or numbness.  She continues to take an aspirin and a statin daily.  She is a former smoker.   Past Medical History:  Diagnosis Date   Anxiety    Basilar artery aneurysm (HCC)    s/p stent supported coil embolization of basilar aneurysm 12/02/20   Dyspnea    Emphysematous COPD (HCC) 04/23/2017   GERD (gastroesophageal reflux disease)    Hyperlipidemia    Hypertension    Peripheral arterial disease (HCC)    Thyroid nodule 04/23/2017    Past Surgical History:  Procedure Laterality Date   ABDOMINAL AORTOGRAM N/A 07/16/2017   Procedure: ABDOMINAL AORTOGRAM;  Surgeon: Nada Libman, MD;  Location: [facility];  Service: Cardiovascular;  Laterality: N/A;   ABDOMINAL AORTOGRAM W/LOWER EXTREMITY Bilateral 12/02/2018   Procedure: ABDOMINAL AORTOGRAM W/LOWER EXTREMITY;  Surgeon: Nada Libman, MD;  Location: [facility];  Service: Cardiovascular;  Laterality: Bilateral;   AORTIC ARCH ANGIOGRAPHY N/A 12/02/2018   Procedure: AORTIC ARCH ANGIOGRAPHY;  Surgeon: Nada Libman, MD;  Location: [facility];  Service:  Cardiovascular;  Laterality: N/A;   basilaraneurysm repair  11/2020   CAROTID-SUBCLAVIAN BYPASS GRAFT Left 02/22/2021   Procedure: LEFT CAROTID-SUBCLAVIAN BYPASS USING HEAMSHIELD GOLD X30MM;  Surgeon: Nada Libman, MD;  Location: [facility] OR;  Service: Vascular;  Laterality: Left;   ENDOMETRIAL BIOPSY  2005   FEMORAL ARTERY STENT Bilateral 2008   IR 3D INDEPENDENT WKST  12/02/2020   IR ANGIO INTRA EXTRACRAN SEL INTERNAL CAROTID UNI R MOD SED  11/08/2020   IR ANGIO VERTEBRAL SEL SUBCLAVIAN INNOMINATE UNI R MOD SED  11/08/2020   IR ANGIO VERTEBRAL SEL VERTEBRAL BILAT MOD SED  12/02/2020   IR ANGIO VERTEBRAL SEL VERTEBRAL UNI R MOD SED  06/16/2021   IR ANGIOGRAM FOLLOW UP STUDY  12/02/2020   IR ANGIOGRAM FOLLOW UP STUDY  12/02/2020   IR ANGIOGRAM FOLLOW UP STUDY  12/02/2020   IR ANGIOGRAM FOLLOW UP STUDY  12/02/2020   IR TRANSCATH/EMBOLIZ  12/02/2020   IR US GUIDE VASC ACCESS RIGHT  11/08/2020   IR US GUIDE VASC ACCESS RIGHT  12/02/2020   IR US GUIDE VASC ACCESS RIGHT  06/16/2021   IR US GUIDE VASC ACCESS RIGHT  06/16/2021   LOWER EXTREMITY ANGIOGRAPHY Bilateral 07/16/2017   Procedure: Lower Extremity Angiography;  Surgeon: Nada Libman, MD;  Location: [facility];  Service: Cardiovascular;  Laterality: Bilateral;   NASAL SINUS SURGERY     PERIPHERAL VASCULAR BALLOON ANGIOPLASTY Bilateral 07/16/2017   Procedure: PERIPHERAL VASCULAR BALLOON ANGIOPLASTY;  Surgeon: Nada Libman, MD;  Location: [facility];  Service: Cardiovascular;  Laterality: Bilateral;  iliacs   PERIPHERAL VASCULAR INTERVENTION Bilateral 12/02/2018   Procedure: PERIPHERAL VASCULAR INTERVENTION;  Surgeon: Nada Libman, MD;  Location: [facility];  Service: Cardiovascular;  Laterality: Bilateral;   RADIOLOGY WITH ANESTHESIA N/A 12/02/2020   Procedure: Arteriogram, Surpass embolization of basilar aneurysm;  Surgeon: Lisbeth Renshaw, MD;  Location: [facility] OR;  Service: Radiology;  Laterality: N/A;    TONSILLECTOMY     TUBAL LIGATION     VASCULAR SURGERY      Social History   Socioeconomic History   Marital status: Married    Spouse name: Not on file   Number of children: 1   Years of education: Not on file   Highest education level: Associate degree: occupational, Scientist, product/process development, or vocational program  Occupational History   Not on file  Tobacco Use   Smoking status: Former    Current packs/day: 0.00    Average packs/day: 0.5 packs/day for 35.0 years (17.5 ttl pk-yrs)    Types: Cigarettes    Start date: 12/19/1982    Quit date: 12/18/2017    Years since quitting: 4.9   Smokeless tobacco: Never   Tobacco comments:    As per pt - smoking 1/2 pk daily  Vaping Use   Vaping status: Never Used  Substance and Sexual Activity   Alcohol use: No   Drug use: No   Sexual activity: Yes  Other Topics Concern   Not on file  Social History Narrative   Not on file   Social Determinants of Health   Financial Resource Strain: Low Risk  (08/27/2022)   Received from [facility], [facility]   Overall Financial Resource Strain (CARDIA)    Difficulty of Paying Living Expenses: Not hard at all  Food Insecurity: No Food Insecurity (08/27/2022)   Received from [facility], [facility]   Hunger Vital Sign    Worried About Running Out of Food in the Last Year: Never true    Ran Out of Food in the Last Year: Never true  Transportation Needs: No Transportation Needs (08/27/2022)   Received from [facility], [facility]   PRAPARE - Transportation    Lack of Transportation (Medical): No    Lack of Transportation (Non-Medical): No  Physical Activity: Unknown (08/27/2022)   Received from [facility], [facility]   Exercise Vital Sign    Days of Exercise per Week: 0 days    Minutes of Exercise per Session: Not on file  Stress: No Stress Concern Present (08/27/2022)   Received from [facility], [facility] Occupational Health - Occupational Stress  Questionnaire    Feeling of Stress : Not at all  Social Connections: Unknown (07/12/2022)   Social Connection and Isolation Panel [NHANES]    Frequency of Communication with Friends and Family: Twice a week    Frequency of Social Gatherings with Friends and Family: Patient declined    Attends Religious Services: Never    Database administrator or Organizations: Yes    Attends Engineer, structural: More than 4 times per year    Marital Status: Married  Catering manager Violence: Not At Risk (08/27/2022)   Received from [employer], [facility]   HITS    Over the last 12 months how often did your partner physically hurt you?: 1    Over the last 12 months how often did your partner insult you or talk down to you?: 1    Over the last 12  months how often did your partner threaten you with physical harm?: 1    Over the last 12 months how often did your partner scream or curse at you?: 1    Family History  Problem Relation Age of Onset   Hypertension Mother    Hypertension Father    Heart disease Father    Peripheral vascular disease Father    Alcohol abuse Father    Fibroids Sister    Cancer Maternal Grandmother        BREAST   Stroke Maternal Grandmother        CEBERAL ATHEROSCLEROSIS   Cancer - Ovarian Cousin     Current Outpatient Medications  Medication Sig Dispense Refill   acetaminophen (TYLENOL) 500 MG tablet Take 1,000 mg by mouth every 6 (six) hours as needed for headache.     albuterol (VENTOLIN HFA) 108 (90 Base) MCG/ACT inhaler Inhale 1-2 puffs into the lungs every 4 (four) hours as needed for wheezing or shortness of breath. 1 each 2   amLODipine (NORVASC) 5 MG tablet Take 1 tablet (5 mg total) by mouth daily. 180 tablet 3   aspirin EC 81 MG tablet Take 1 tablet (81 mg total) by mouth daily. Swallow whole. 90 tablet 3   atorvastatin (LIPITOR) 80 MG tablet Take 1 tablet (80 mg total) by mouth at bedtime. 90 tablet 1   chlorpheniramine-HYDROcodone (TUSSIONEX)  10-8 MG/5ML Take 5 mLs by mouth every 12 (twelve) hours as needed for cough (cough, will cause drowsiness.). 120 mL 0   Cholecalciferol (VITAMIN D3 PO) Take 1 tablet by mouth in the morning.     estradiol (ESTRACE) 0.1 MG/GM vaginal cream Place 1 g vaginally 2 (two) times a week.     ezetimibe (ZETIA) 10 MG tablet Take 1 tablet (10 mg total) by mouth daily. 90 tablet 3   losartan (COZAAR) 100 MG tablet Take 1 tablet (100 mg total) by mouth daily. 90 tablet 1   omeprazole (PRILOSEC) 40 MG capsule Take 1 capsule (40 mg total) by mouth daily. 90 capsule 3   promethazine (PHENERGAN) 12.5 MG tablet Take 1 tablet (12.5 mg total) by mouth every 4 (four) hours as needed for nausea or vomiting. 30 tablet 1   Tiotropium Bromide-Olodaterol (STIOLTO RESPIMAT) 2.5-2.5 MCG/ACT AERS Inhale 2 puffs into the lungs in the morning. 12 g 1   vitamin B-12 (CYANOCOBALAMIN) 1000 MCG tablet Take 1,000 mcg by mouth daily.      No current facility-administered medications for this visit.    Allergies  Allergen Reactions   Doxycycline Hives, Rash and Other (See Comments)    Pain, photosensitive     REVIEW OF SYSTEMS:   [X]  denotes positive finding, [ ]  denotes negative finding Cardiac  Comments:  Chest pain or chest pressure:    Shortness of breath upon exertion:    Short of breath when lying flat:    Irregular heart rhythm:        Vascular    Pain in calf, thigh, or hip brought on by ambulation:    Pain in feet at night that wakes you up from your sleep:     Blood clot in your veins:    Leg swelling:         Pulmonary    Oxygen at home:    Productive cough:     Wheezing:         Neurologic    Sudden weakness in arms or legs:     Sudden numbness in arms  or legs:     Sudden onset of difficulty speaking or slurred speech:    Temporary loss of vision in one eye:     Problems with dizziness:         Gastrointestinal    Blood in stool:     Vomited blood:         Genitourinary    Burning when  urinating:     Blood in urine:        Psychiatric    Major depression:         Hematologic    Bleeding problems:    Problems with blood clotting too easily:        Skin    Rashes or ulcers:        Constitutional    Fever or chills:      PHYSICAL EXAMINATION:  Vitals:   12/03/22 0958  BP: 125/81  Pulse: 81  Resp: 18  Temp: (!) 97.4 F (36.3 C)  TempSrc: Temporal  SpO2: 94%  Weight: 176 lb 12.8 oz (80.2 kg)  Height: 5\' 4"  (1.626 m)    General:  WDWN in NAD; vital signs documented above Gait: Not observed HENT: WNL, normocephalic Pulmonary: normal non-labored breathing , without Rales, rhonchi,  wheezing Cardiac: regular HR Abdomen: soft, NT, no masses Skin: without rashes Vascular Exam/Pulses: Symmetrical radial pulses; palpable ATA pulses bilaterally Extremities: without ischemic changes, without Gangrene , without cellulitis; without open wounds;  Musculoskeletal: no muscle wasting or atrophy  Neurologic: A&O X 3; cranial nerves grossly intact Psychiatric:  The pt has Normal affect.   Non-Invasive Vascular Imaging:   Carotid duplex demonstrates 40 to 59% stenosis of the right ICA Left ICA without hemodynamically significant stenosis Widely patent left common carotid to subclavian bypass  Iliac stents widely patent on aortoiliac duplex  ABI/TBIToday's ABIToday's TBIPrevious ABIPrevious TBI  +-------+-----------+-----------+------------+------------+  Right 1.11       0.83       1.17        0.81          +-------+-----------+-----------+------------+------------+  Left  1.03       0.78       1.21        0.91             ASSESSMENT/PLAN:: 62 y.o. female here for follow up for surveillance of aortoiliac occlusive disease as well as carotid stenosis and surveillance of left carotid subclavian bypass  Ms. Helmick is a 62 year old female well-known to [facility] with numerous interventions in the past.  Since last office visit she has not developed  any claudication, rest pain, or tissue loss of bilateral lower extremities.  Aortoiliac duplex demonstrates widely patent bilateral iliac stents.  Feet are well-perfused with palpable ATA pulses.  Carotid duplex demonstrates stable findings with 40 to 59% stenosis of the right ICA and 1 to 39% stenosis of the left ICA.  Left carotid subclavian bypass widely patent.  We will repeat carotid duplex, aortoiliac duplex, and ABIs in 1 year.  She will continue her aspirin statin daily.   Emilie Rutter, PA-C [facility] [phone]  Clinic MD:   Myra Gianotti

## 2022-12-11 DIAGNOSIS — K588 Other irritable bowel syndrome: Secondary | ICD-10-CM | POA: Diagnosis not present

## 2022-12-11 DIAGNOSIS — K219 Gastro-esophageal reflux disease without esophagitis: Secondary | ICD-10-CM | POA: Diagnosis not present

## 2022-12-19 ENCOUNTER — Ambulatory Visit (INDEPENDENT_AMBULATORY_CARE_PROVIDER_SITE_OTHER): Payer: Federal, State, Local not specified - PPO | Admitting: Family Medicine

## 2022-12-19 ENCOUNTER — Encounter: Payer: Self-pay | Admitting: Family Medicine

## 2022-12-19 VITALS — BP 121/58 | HR 91 | Ht 65.0 in | Wt 176.0 lb

## 2022-12-19 DIAGNOSIS — R7301 Impaired fasting glucose: Secondary | ICD-10-CM

## 2022-12-19 DIAGNOSIS — I10 Essential (primary) hypertension: Secondary | ICD-10-CM

## 2022-12-19 DIAGNOSIS — E785 Hyperlipidemia, unspecified: Secondary | ICD-10-CM

## 2022-12-19 DIAGNOSIS — M7742 Metatarsalgia, left foot: Secondary | ICD-10-CM

## 2022-12-19 DIAGNOSIS — M7741 Metatarsalgia, right foot: Secondary | ICD-10-CM

## 2022-12-19 DIAGNOSIS — M810 Age-related osteoporosis without current pathological fracture: Secondary | ICD-10-CM | POA: Diagnosis not present

## 2022-12-19 DIAGNOSIS — G4733 Obstructive sleep apnea (adult) (pediatric): Secondary | ICD-10-CM

## 2022-12-19 DIAGNOSIS — E041 Nontoxic single thyroid nodule: Secondary | ICD-10-CM | POA: Diagnosis not present

## 2022-12-19 NOTE — Assessment & Plan Note (Signed)
Due to recheck lipids. 

## 2022-12-19 NOTE — Progress Notes (Signed)
Established Patient Office Visit  Subjective   Patient ID: Tina Navarro, female    DOB: September 11, 1960  Age: 62 y.o. MRN: [id number]  Chief Complaint  Patient presents with   Medical Management of Chronic Issues    HPI  Hypertension- Pt denies chest pain, SOB, dizziness, or heart palpitations.  Taking meds as directed w/o problems.  Denies medication side effects.    Impaired fasting glucose-no increased thirst or urination. No symptoms consistent with hypoglycemia.  She also had bone density testing done in Sept and was dx with T score of -2.5.  Has f/u with endocrine in Jan.   OSA - did get a call from the sleep center but was in the middle of getting mammogram and forgot to call back. Given phone number to call today.    Feet feel numb and tingly at time in both feet over the ball of the foot.       ROS    Objective:     BP (!) 121/58   Pulse 91   Ht 5\' 5"  (1.651 m)   Wt 176 lb (79.8 kg)   LMP  (LMP Unknown)   SpO2 98%   BMI 29.29 kg/m    Physical Exam Vitals and nursing note reviewed.  Constitutional:      Appearance: Normal appearance.  HENT:     Head: Normocephalic and atraumatic.  Eyes:     Conjunctiva/sclera: Conjunctivae normal.  Cardiovascular:     Rate and Rhythm: Normal rate and regular rhythm.  Pulmonary:     Effort: Pulmonary effort is normal.     Breath sounds: Normal breath sounds.  Skin:    General: Skin is warm and dry.  Neurological:     Mental Status: She is alert.  Psychiatric:        Mood and Affect: Mood normal.      No results found for any visits on 12/19/22.    The 10-year ASCVD risk score (Arnett DK, et al., 2019) is: 4.7%    Assessment & Plan:   Problem List Items Addressed This Visit       Cardiovascular and Mediastinum   Essential hypertension - Primary    Well controlled. Continue current regimen. Follow up in  6 mo       Relevant Orders   Lipid Panel With LDL/HDL Ratio   CMP14+EGFR   HgB A1c   TSH    Vitamin D (25 hydroxy)     Respiratory   OSA (obstructive sleep apnea)    Given number to call sleep center back to get scheduled for sleep study.         Endocrine   Thyroid nodule   Relevant Orders   Lipid Panel With LDL/HDL Ratio   CMP14+EGFR   HgB A1c   TSH   Vitamin D (25 hydroxy)   IFG (impaired fasting glucose)    Due to recheck A1C  Lab Results  Component Value Date   HGBA1C 5.3 09/03/2018         Relevant Orders   Lipid Panel With LDL/HDL Ratio   CMP14+EGFR   HgB A1c   TSH   Vitamin D (25 hydroxy)     Musculoskeletal and Integument   Age-related osteoporosis without current pathological fracture    10/2022: t score -2.5.  On vitamin D. Given info on calcium rich diet.  Endocrine to discuss oral bisphosphonates vs Prolia/Evenity. She is already doing resistance exercise.       Relevant Orders  Vitamin D (25 hydroxy)     Other   Dyslipidemia, goal LDL below 70   Dyslipidemia    Due to recheck lipids.       Relevant Orders   Lipid Panel With LDL/HDL Ratio   CMP14+EGFR   HgB A1c   TSH   Vitamin D (25 hydroxy)   Other Visit Diagnoses     Metatarsalgia of both feet          Metatarsalgia - given metatarsal pads to try in her sneakers and if helpful can order extra pairs from [facility].    Return in about 6 months (around 06/19/2023) for Hypertension.    Nani Gasser, MD

## 2022-12-19 NOTE — Assessment & Plan Note (Signed)
10/2022: t score -2.5.  On vitamin D. Given info on calcium rich diet.  Endocrine to discuss oral bisphosphonates vs Prolia/Evenity. She is already doing resistance exercise.

## 2022-12-19 NOTE — Assessment & Plan Note (Signed)
Due to recheck A1C  Lab Results  Component Value Date   HGBA1C 5.3 09/03/2018

## 2022-12-19 NOTE — Assessment & Plan Note (Signed)
Well controlled. Continue current regimen. Follow up in  6 mo  

## 2022-12-19 NOTE — Assessment & Plan Note (Signed)
Given number to call sleep center back to get scheduled for sleep study.

## 2022-12-20 LAB — CMP14+EGFR
ALT: 22 [IU]/L (ref 0–32)
AST: 31 [IU]/L (ref 0–40)
Albumin: 4.4 g/dL (ref 3.9–4.9)
Alkaline Phosphatase: 128 [IU]/L — ABNORMAL HIGH (ref 44–121)
BUN/Creatinine Ratio: 19 (ref 12–28)
BUN: 17 mg/dL (ref 8–27)
Bilirubin Total: 0.2 mg/dL (ref 0.0–1.2)
CO2: 22 mmol/L (ref 20–29)
Calcium: 9 mg/dL (ref 8.7–10.3)
Chloride: 106 mmol/L (ref 96–106)
Creatinine, Ser: 0.91 mg/dL (ref 0.57–1.00)
Globulin, Total: 1.9 g/dL (ref 1.5–4.5)
Glucose: 103 mg/dL — ABNORMAL HIGH (ref 70–99)
Potassium: 3.6 mmol/L (ref 3.5–5.2)
Sodium: 143 mmol/L (ref 134–144)
Total Protein: 6.3 g/dL (ref 6.0–8.5)
eGFR: 71 mL/min/{1.73_m2} (ref >59)

## 2022-12-20 LAB — LIPID PANEL WITH LDL/HDL RATIO
Cholesterol, Total: 127 mg/dL (ref 100–199)
HDL: 37 mg/dL — ABNORMAL LOW (ref >39)
LDL Chol Calc (NIH): 56 mg/dL (ref 0–99)
LDL/HDL Ratio: 1.5 ratio (ref 0.0–3.2)
Triglycerides: 205 mg/dL — ABNORMAL HIGH (ref 0–149)
VLDL Cholesterol Cal: 34 mg/dL (ref 5–40)

## 2022-12-20 LAB — HEMOGLOBIN A1C
Est. average glucose Bld gHb Est-mCnc: 123 mg/dL
Hgb A1c MFr Bld: 5.9 % — ABNORMAL HIGH (ref 4.8–5.6)

## 2022-12-20 LAB — VITAMIN D 25 HYDROXY (VIT D DEFICIENCY, FRACTURES): Vit D, 25-Hydroxy: 34.1 ng/mL (ref 30.0–100.0)

## 2022-12-20 LAB — TSH: TSH: 2 u[IU]/mL (ref 0.450–4.500)

## 2022-12-20 NOTE — Progress Notes (Signed)
Hi Tina Navarro, your LDL look great . Triglycerides look better as well. Metabolic panel is OK. Your A1C is up in the prediabetes range of 5.9.  Continue to work on low carb and low sugar diet.  Thyroid is normal.  Vitamin D looks better!!

## 2022-12-28 ENCOUNTER — Other Ambulatory Visit: Payer: Self-pay

## 2022-12-28 DIAGNOSIS — I6523 Occlusion and stenosis of bilateral carotid arteries: Secondary | ICD-10-CM

## 2022-12-28 DIAGNOSIS — I739 Peripheral vascular disease, unspecified: Secondary | ICD-10-CM

## 2022-12-28 DIAGNOSIS — I70213 Atherosclerosis of native arteries of extremities with intermittent claudication, bilateral legs: Secondary | ICD-10-CM

## 2023-02-01 ENCOUNTER — Encounter (HOSPITAL_BASED_OUTPATIENT_CLINIC_OR_DEPARTMENT_OTHER): Payer: Federal, State, Local not specified - PPO | Admitting: Internal Medicine

## 2023-02-15 ENCOUNTER — Other Ambulatory Visit: Payer: Self-pay | Admitting: Cardiology

## 2023-02-15 DIAGNOSIS — R072 Precordial pain: Secondary | ICD-10-CM

## 2023-02-21 ENCOUNTER — Other Ambulatory Visit: Payer: Self-pay | Admitting: Cardiology

## 2023-02-21 DIAGNOSIS — R072 Precordial pain: Secondary | ICD-10-CM

## 2023-02-22 ENCOUNTER — Telehealth: Payer: Self-pay | Admitting: Cardiology

## 2023-02-22 ENCOUNTER — Other Ambulatory Visit (HOSPITAL_COMMUNITY): Payer: Self-pay

## 2023-02-22 DIAGNOSIS — E7800 Pure hypercholesterolemia, unspecified: Secondary | ICD-10-CM

## 2023-02-22 MED ORDER — EZETIMIBE 10 MG PO TABS
10.0000 mg | ORAL_TABLET | Freq: Every day | ORAL | 0 refills | Status: DC
  Filled 2023-02-22 – 2023-03-13 (×3): qty 90, 90d supply, fill #0

## 2023-02-22 NOTE — Telephone Encounter (Signed)
*  STAT* If patient is at the pharmacy, call can be transferred to refill team.   1. Which medications need to be refilled? (please list name of each medication and dose if known)   atorvastatin (LIPITOR) 80 MG tablet   amLODipine (NORVASC) 5 MG tablet  ezetimibe (ZETIA) 10 MG tablet    4. Which pharmacy/location (including street and city if local pharmacy) is medication to be sent to? [facility] - [place], [place] - [address]     5. Do they need a 30 day or 90 day supply? 90   Pt is scheduled for next available date in [place], 05/29/23 and added to waitlist

## 2023-02-25 ENCOUNTER — Other Ambulatory Visit (HOSPITAL_COMMUNITY): Payer: Self-pay

## 2023-03-07 ENCOUNTER — Other Ambulatory Visit (HOSPITAL_COMMUNITY): Payer: Self-pay

## 2023-03-08 ENCOUNTER — Other Ambulatory Visit: Payer: Self-pay | Admitting: Family Medicine

## 2023-03-08 ENCOUNTER — Other Ambulatory Visit: Payer: Self-pay | Admitting: Cardiology

## 2023-03-08 DIAGNOSIS — E7800 Pure hypercholesterolemia, unspecified: Secondary | ICD-10-CM

## 2023-03-13 ENCOUNTER — Other Ambulatory Visit: Payer: Self-pay | Admitting: Family Medicine

## 2023-03-13 ENCOUNTER — Other Ambulatory Visit (HOSPITAL_COMMUNITY): Payer: Self-pay

## 2023-03-13 DIAGNOSIS — J439 Emphysema, unspecified: Secondary | ICD-10-CM

## 2023-03-19 DIAGNOSIS — E042 Nontoxic multinodular goiter: Secondary | ICD-10-CM | POA: Diagnosis not present

## 2023-03-19 DIAGNOSIS — Z1330 Encounter for screening examination for mental health and behavioral disorders, unspecified: Secondary | ICD-10-CM | POA: Diagnosis not present

## 2023-03-19 DIAGNOSIS — M810 Age-related osteoporosis without current pathological fracture: Secondary | ICD-10-CM | POA: Diagnosis not present

## 2023-03-19 DIAGNOSIS — R748 Abnormal levels of other serum enzymes: Secondary | ICD-10-CM | POA: Diagnosis not present

## 2023-03-21 DIAGNOSIS — R748 Abnormal levels of other serum enzymes: Secondary | ICD-10-CM | POA: Diagnosis not present

## 2023-03-22 ENCOUNTER — Other Ambulatory Visit: Payer: Self-pay | Admitting: Cardiology

## 2023-03-22 DIAGNOSIS — E7800 Pure hypercholesterolemia, unspecified: Secondary | ICD-10-CM

## 2023-03-22 DIAGNOSIS — R072 Precordial pain: Secondary | ICD-10-CM

## 2023-03-26 ENCOUNTER — Other Ambulatory Visit (HOSPITAL_COMMUNITY): Payer: Self-pay

## 2023-03-26 ENCOUNTER — Telehealth: Payer: Self-pay | Admitting: Cardiology

## 2023-03-26 ENCOUNTER — Other Ambulatory Visit: Payer: Self-pay | Admitting: Family Medicine

## 2023-03-26 DIAGNOSIS — R928 Other abnormal and inconclusive findings on diagnostic imaging of breast: Secondary | ICD-10-CM | POA: Diagnosis not present

## 2023-03-26 DIAGNOSIS — R072 Precordial pain: Secondary | ICD-10-CM

## 2023-03-26 DIAGNOSIS — E7800 Pure hypercholesterolemia, unspecified: Secondary | ICD-10-CM

## 2023-03-26 DIAGNOSIS — R92322 Mammographic fibroglandular density, left breast: Secondary | ICD-10-CM | POA: Diagnosis not present

## 2023-03-26 DIAGNOSIS — R920 Mammographic microcalcification found on diagnostic imaging of breast: Secondary | ICD-10-CM | POA: Diagnosis not present

## 2023-03-26 MED ORDER — EZETIMIBE 10 MG PO TABS: 10.0000 mg | ORAL_TABLET | Freq: Every day | ORAL | 0 refills | Status: DC

## 2023-03-26 MED ORDER — ATORVASTATIN CALCIUM 80 MG PO TABS: 80.0000 mg | ORAL_TABLET | Freq: Every day | ORAL | 0 refills | Status: DC

## 2023-03-26 MED ORDER — AMLODIPINE BESYLATE 5 MG PO TABS: 5.0000 mg | ORAL_TABLET | Freq: Every day | ORAL | 0 refills | Status: DC

## 2023-03-26 NOTE — Telephone Encounter (Signed)
*  STAT* If patient is at the pharmacy, call can be transferred to refill team.   1. Which medications need to be refilled? (please list name of each medication and dose if known)   atorvastatin (LIPITOR) 80 MG tablet    amLODipine (NORVASC) 5 MG tablet  ezetimibe (ZETIA) 10 MG tablet     2. Which pharmacy/location (including street and city if local pharmacy) is medication to be sent to?  [facility] - [place], [place] - [address]      3. Do they need a 30 day or 90 day supply? 90 day   Pt is out of medication and has a upcoming appt in April as well as being on the waitlist.

## 2023-03-26 NOTE — Telephone Encounter (Signed)
Pt's medications were sent to pt's pharmacy as requested. Confirmation received.

## 2023-03-27 ENCOUNTER — Telehealth: Payer: Self-pay

## 2023-03-27 NOTE — Telephone Encounter (Signed)
Copied from CRM [id number]. Topic: Clinical - Prescription Issue >> Mar 26, 2023  3:33 PM Geroge Baseman wrote: Reason for EAV:[id number] Pharmacy, [facility] called to see if patients amlodepine 5mg , and zetia 10mg . Patient needs these as soon as possible. Pharmacy wants to know if they can be filled by dr Linford Arnold today [phone]

## 2023-03-27 NOTE — Telephone Encounter (Signed)
Prescription refills sent to pharmacy yesterday by provider

## 2023-04-10 DIAGNOSIS — M810 Age-related osteoporosis without current pathological fracture: Secondary | ICD-10-CM | POA: Diagnosis not present

## 2023-05-29 ENCOUNTER — Ambulatory Visit: Payer: Federal, State, Local not specified - PPO | Admitting: Cardiology

## 2023-06-07 DIAGNOSIS — L905 Scar conditions and fibrosis of skin: Secondary | ICD-10-CM | POA: Diagnosis not present

## 2023-06-08 NOTE — Progress Notes (Signed)
 HPI: FU coronary calcification.  Patient does have a history of peripheral vascular disease and has had prior angioplasty and stent of her left common iliac artery and right common iliac artery.  Left subclavian was occluded. Nuclear study 8/22 showed EF 76 with normal perfusion. Had left carotid subclavian bypass 12/22.  Chest CT May 2023 showed atherosclerosis in the thoracic aorta and coronary calcification; ectatic ascending aorta 3.5 cm.  Carotid Dopplers 10/24 showed 40-59% right and 1 to 39% left stenosis. Dopplers 10/24 showed patent stents and ABIs normal. Since last seen, she denies dyspnea, chest pain, palpitations or syncope.   Current Outpatient Medications  Medication Sig Dispense Refill   acetaminophen  (TYLENOL ) 500 MG tablet Take 1,000 mg by mouth every 6 (six) hours as needed for headache.     albuterol  (VENTOLIN  HFA) 108 (90 Base) MCG/ACT inhaler Inhale 1-2 puffs into the lungs every 4 (four) hours as needed for wheezing or shortness of breath. 1 each 2   amLODipine  (NORVASC ) 5 MG tablet Take 1 tablet (5 mg total) by mouth daily. 90 tablet 0   atorvastatin  (LIPITOR ) 80 MG tablet Take 1 tablet (80 mg total) by mouth at bedtime. 90 tablet 0   Cholecalciferol  (VITAMIN D3 PO) Take 1 tablet by mouth in the morning.     estradiol  (ESTRACE ) 0.1 MG/GM vaginal cream Place 1 g vaginally 2 (two) times a week.     ezetimibe  (ZETIA ) 10 MG tablet Take 1 tablet (10 mg total) by mouth daily. 90 tablet 0   losartan  (COZAAR ) 100 MG tablet Take 1 tablet (100 mg total) by mouth daily. 90 tablet 1   omeprazole  (PRILOSEC) 40 MG capsule Take 1 capsule (40 mg total) by mouth daily. 90 capsule 3   promethazine  (PHENERGAN ) 12.5 MG tablet Take 1 tablet (12.5 mg total) by mouth every 4 (four) hours as needed for nausea or vomiting. 30 tablet 1   QC LO-DOSE ASPIRIN  81 MG tablet Take 1 tablet (81 mg total) by mouth daily. 90 tablet 3   STIOLTO RESPIMAT  2.5-2.5 MCG/ACT AERS Inhale 2 puffs into the lungs  in the morning. 12 g 1   vitamin B-12 (CYANOCOBALAMIN ) 1000 MCG tablet Take 1,000 mcg by mouth daily.      zoledronic acid (ZOMETA) 4 MG/5ML injection Inject 4 mg into the vein once.     No current facility-administered medications for this visit.     Past Medical History:  Diagnosis Date   Anxiety    Basilar artery aneurysm (HCC)    s/p stent supported coil embolization of basilar aneurysm 12/02/20   COVID-19 10/16/2022   Dyspnea    Emphysematous COPD (HCC) 04/23/2017   GERD (gastroesophageal reflux disease)    Hyperlipidemia    Hypertension    Peripheral arterial disease (HCC)    Thyroid  nodule 04/23/2017    Past Surgical History:  Procedure Laterality Date   ABDOMINAL AORTOGRAM N/A 07/16/2017   Procedure: ABDOMINAL AORTOGRAM;  Surgeon: Margherita Shell, MD;  Location: [facility];  Service: Cardiovascular;  Laterality: N/A;   ABDOMINAL AORTOGRAM W/LOWER EXTREMITY Bilateral 12/02/2018   Procedure: ABDOMINAL AORTOGRAM W/LOWER EXTREMITY;  Surgeon: Margherita Shell, MD;  Location: [facility];  Service: Cardiovascular;  Laterality: Bilateral;   AORTIC ARCH ANGIOGRAPHY N/A 12/02/2018   Procedure: AORTIC ARCH ANGIOGRAPHY;  Surgeon: Margherita Shell, MD;  Location: [facility];  Service: Cardiovascular;  Laterality: N/A;   basilaraneurysm repair  11/2020   CAROTID-SUBCLAVIAN BYPASS GRAFT Left 02/22/2021  Procedure: LEFT CAROTID-SUBCLAVIAN BYPASS USING HEAMSHIELD GOLD X30MM;  Surgeon: Margherita Shell, MD;  Location: [facility];  Service: Vascular;  Laterality: Left;   ENDOMETRIAL BIOPSY  2005   FEMORAL ARTERY STENT Bilateral 2008   IR 3D INDEPENDENT WKST  12/02/2020   IR ANGIO INTRA EXTRACRAN SEL INTERNAL CAROTID UNI R MOD SED  11/08/2020   IR ANGIO VERTEBRAL SEL SUBCLAVIAN INNOMINATE UNI R MOD SED  11/08/2020   IR ANGIO VERTEBRAL SEL VERTEBRAL BILAT MOD SED  12/02/2020   IR ANGIO VERTEBRAL SEL VERTEBRAL UNI R MOD SED  06/16/2021   IR ANGIOGRAM FOLLOW UP STUDY   12/02/2020   IR ANGIOGRAM FOLLOW UP STUDY  12/02/2020   IR ANGIOGRAM FOLLOW UP STUDY  12/02/2020   IR ANGIOGRAM FOLLOW UP STUDY  12/02/2020   IR TRANSCATH/EMBOLIZ  12/02/2020   IR US  GUIDE VASC ACCESS RIGHT  11/08/2020   IR US  GUIDE VASC ACCESS RIGHT  12/02/2020   IR US  GUIDE VASC ACCESS RIGHT  06/16/2021   IR US  GUIDE VASC ACCESS RIGHT  06/16/2021   LOWER EXTREMITY ANGIOGRAPHY Bilateral 07/16/2017   Procedure: Lower Extremity Angiography;  Surgeon: Margherita Shell, MD;  Location: [facility];  Service: Cardiovascular;  Laterality: Bilateral;   NASAL SINUS SURGERY     PERIPHERAL VASCULAR BALLOON ANGIOPLASTY Bilateral 07/16/2017   Procedure: PERIPHERAL VASCULAR BALLOON ANGIOPLASTY;  Surgeon: Margherita Shell, MD;  Location: [facility];  Service: Cardiovascular;  Laterality: Bilateral;  iliacs   PERIPHERAL VASCULAR INTERVENTION Bilateral 12/02/2018   Procedure: PERIPHERAL VASCULAR INTERVENTION;  Surgeon: Margherita Shell, MD;  Location: [facility];  Service: Cardiovascular;  Laterality: Bilateral;   RADIOLOGY WITH ANESTHESIA N/A 12/02/2020   Procedure: Arteriogram, Surpass embolization of basilar aneurysm;  Surgeon: Augusto Blonder, MD;  Location: [facility] OR;  Service: Radiology;  Laterality: N/A;   TONSILLECTOMY     TUBAL LIGATION     VASCULAR SURGERY      Social History   Socioeconomic History   Marital status: Married    Spouse name: Not on file   Number of children: 1   Years of education: Not on file   Highest education level: Associate degree: academic program  Occupational History   Not on file  Tobacco Use   Smoking status: Former    Current packs/day: 0.00    Average packs/day: 0.5 packs/day for 35.0 years (17.5 ttl pk-yrs)    Types: Cigarettes    Start date: 12/19/1982    Quit date: 12/18/2017    Years since quitting: 5.4   Smokeless tobacco: Never   Tobacco comments:    As per pt - smoking 1/2 pk daily  Vaping Use   Vaping status: Never Used   Substance and Sexual Activity   Alcohol use: No   Drug use: No   Sexual activity: Yes  Other Topics Concern   Not on file  Social History Narrative   Not on file   Social Drivers of Health   Financial Resource Strain: Low Risk  (06/17/2023)   Overall Financial Resource Strain (CARDIA)    Difficulty of Paying Living Expenses: Not hard at all  Food Insecurity: No Food Insecurity (06/17/2023)   Hunger Vital Sign    Worried About Running Out of Food in the Last Year: Never true    Ran Out of Food in the Last Year: Never true  Transportation Needs: No Transportation Needs (06/17/2023)   PRAPARE - Administrator, Civil Service (Medical):  No    Lack of Transportation (Non-Medical): No  Physical Activity: Unknown (06/17/2023)   Exercise Vital Sign    Days of Exercise per Week: 0 days    Minutes of Exercise per Session: Not on file  Stress: No Stress Concern Present (06/17/2023)   [employer] of Occupational Health - Occupational Stress Questionnaire    Feeling of Stress : Not at all  Social Connections: Moderately Integrated (06/17/2023)   Social Connection and Isolation Panel [NHANES]    Frequency of Communication with Friends and Family: Twice a week    Frequency of Social Gatherings with Friends and Family: Once a week    Attends Religious Services: Never    Database administrator or Organizations: Yes    Attends Engineer, structural: More than 4 times per year    Marital Status: Married  Catering manager Violence: Not At Risk (08/27/2022)   Received from [employer], [facility]   HITS    Over the last 12 months how often did your partner physically hurt you?: Never    Over the last 12 months how often did your partner insult you or talk down to you?: Never    Over the last 12 months how often did your partner threaten you with physical harm?: Never    Over the last 12 months how often did your partner scream or curse at you?: Never    Family History   Problem Relation Age of Onset   Hypertension Mother    Hypertension Father    Heart disease Father    Peripheral vascular disease Father    Alcohol abuse Father    Fibroids Sister    Cancer Maternal Grandmother        BREAST   Stroke Maternal Grandmother        CEBERAL ATHEROSCLEROSIS   Cancer - Ovarian Cousin     ROS: no fevers or chills, productive cough, hemoptysis, dysphasia, odynophagia, melena, hematochezia, dysuria, hematuria, rash, seizure activity, orthopnea, PND, pedal edema, claudication. Remaining systems are negative.  Physical Exam: Well-developed well-nourished in no acute distress.  Skin is warm and dry.  HEENT is normal.  Neck is supple.  Chest is clear to auscultation with normal expansion.  Cardiovascular exam is regular rate and rhythm.  Abdominal exam nontender or distended. No masses palpated. Extremities show no edema. neuro grossly intact  EKG Interpretation Date/Time:  Monday June 17 2023 10:31:18 EDT Ventricular Rate:  93 PR Interval:    QRS Duration:  64 QT Interval:  362 QTC Calculation: 450 R Axis:   88  Text Interpretation: Normal sinus rhythm Low voltage QRS Confirmed by Alexandria Angel ([id number]) on 06/17/2023 10:33:14 AM    A/P  1 coronary calcification-no chest pain.  Previous nuclear study showed no ischemia.  Continue medical therapy with aspirin  and statin.  2 carotid artery disease/peripheral vascular disease-followed by vascular surgery.  Continue ASA and statin.   3 hypertension-BP controlled; continue present meds and follow.  4 hyperlipidemia-continue present meds. Goal LDL less than 55.  5 ectatic thoracic aorta-3.5 cm on previous CT; will not pursue additional imaging.  Alexandria Angel, MD

## 2023-06-13 ENCOUNTER — Ambulatory Visit: Payer: Self-pay

## 2023-06-13 NOTE — Telephone Encounter (Signed)
 OK to schedule in acu for Monday.

## 2023-06-13 NOTE — Telephone Encounter (Signed)
 Contacted patient and offered to work in an appointment 06/17/23 at 3:20pm. Patient declined appointment stating she wanted to keep appointment 06/19/23.

## 2023-06-13 NOTE — Telephone Encounter (Signed)
 Patient next appt is 06/19/23 with Dr. Greer Leak for regular follow up visit.

## 2023-06-13 NOTE — Telephone Encounter (Signed)
  Chief Complaint: Back pain Symptoms: moderate/severe back pain Frequency: 2 weeks Pertinent Negatives: Patient denies numbness Disposition: [] ED /[] Urgent Care (no appt availability in office) / [x] Appointment(In office/virtual)/ []  [facility]/ [] Home Care/ [] Refused Recommended Disposition /[]  Mobile Bus/ []  Follow-up with PCP Additional Notes: Pt was in [place], lifting heavy items and turning. Pt states that pain started 2 weeks ago. Pt is concerned that back has injury. Pt has osteoarthritis, and recently had a injection. Pt needs to be seen today. No appts available. Pt would like to have x-ray, or other imaging done today for damage to back. Pt would like a call back for appt. Pt will seek immediate care if numbness or bladder/bowel issues start.   Copied from CRM [id number]. Topic: Clinical - Red Word Triage >> Jun 13, 2023  8:19 AM Blair Bumpers wrote: Red Word that prompted transfer to Nurse Triage: Patient states her back is hurting really bad. States it's sharp pains and she can move and breathe and it literally takes her breath away. Patient states this has been ongoing for a few weeks now. Has an appt for next week with Dr. Greer Leak that she still want to keep. Just want to try to be seen today if possible. Reason for Disposition  [1] SEVERE back pain (e.g., excruciating, unable to do any normal activities) AND [2] not improved 2 hours after pain medicine  Answer Assessment - Initial Assessment Questions 1. ONSET: "When did the pain begin?"      A couple weeks ago 2. LOCATION: "Where does it hurt?" (upper, mid or lower back)     All over and sides 3. SEVERITY: "How bad is the pain?"  (e.g., Scale 1-10; mild, moderate, or severe)   - MILD (1-3): Doesn't interfere with normal activities.    - MODERATE (4-7): Interferes with normal activities or awakens from sleep.    - SEVERE (8-10): Excruciating pain, unable to do any normal activities.      Moderate/severe 4.  PATTERN: "Is the pain constant?" (e.g., yes, no; constant, intermittent)      Constant worse  - gets worse 5. RADIATION: "Does the pain shoot into your legs or somewhere else?"     no 6. CAUSE:  "What do you think is causing the back pain?"      Moving boxes 7. BACK OVERUSE:  "Any recent lifting of heavy objects, strenuous work or exercise?"     yes  9. NEUROLOGIC SYMPTOMS: "Do you have any weakness, numbness, or problems with bowel/bladder control?"     no 10. OTHER SYMPTOMS: "Do you have any other symptoms?" (e.g., fever, abdomen pain, burning with urination, blood in urine)       none  Protocols used: Back Pain-A-AH

## 2023-06-17 ENCOUNTER — Ambulatory Visit (INDEPENDENT_AMBULATORY_CARE_PROVIDER_SITE_OTHER): Payer: Federal, State, Local not specified - PPO | Admitting: Cardiology

## 2023-06-17 VITALS — BP 130/62 | HR 93 | Ht 65.0 in | Wt 175.0 lb

## 2023-06-17 DIAGNOSIS — I2510 Atherosclerotic heart disease of native coronary artery without angina pectoris: Secondary | ICD-10-CM | POA: Diagnosis not present

## 2023-06-17 DIAGNOSIS — R072 Precordial pain: Secondary | ICD-10-CM

## 2023-06-17 DIAGNOSIS — E7800 Pure hypercholesterolemia, unspecified: Secondary | ICD-10-CM

## 2023-06-17 DIAGNOSIS — I10 Essential (primary) hypertension: Secondary | ICD-10-CM

## 2023-06-17 DIAGNOSIS — I739 Peripheral vascular disease, unspecified: Secondary | ICD-10-CM | POA: Diagnosis not present

## 2023-06-17 NOTE — Patient Instructions (Signed)

## 2023-06-19 ENCOUNTER — Ambulatory Visit (INDEPENDENT_AMBULATORY_CARE_PROVIDER_SITE_OTHER): Payer: Federal, State, Local not specified - PPO | Admitting: Family Medicine

## 2023-06-19 ENCOUNTER — Encounter: Payer: Self-pay | Admitting: Family Medicine

## 2023-06-19 VITALS — BP 121/69 | HR 84 | Ht 65.0 in | Wt 174.0 lb

## 2023-06-19 DIAGNOSIS — M549 Dorsalgia, unspecified: Secondary | ICD-10-CM | POA: Diagnosis not present

## 2023-06-19 DIAGNOSIS — I10 Essential (primary) hypertension: Secondary | ICD-10-CM

## 2023-06-19 DIAGNOSIS — R7301 Impaired fasting glucose: Secondary | ICD-10-CM

## 2023-06-19 LAB — POCT GLYCOSYLATED HEMOGLOBIN (HGB A1C): Hemoglobin A1C: 5.6 % (ref 4.0–5.6)

## 2023-06-19 MED ORDER — TIZANIDINE HCL 4 MG PO CAPS
4.0000 mg | ORAL_CAPSULE | Freq: Three times a day (TID) | ORAL | 0 refills | Status: DC | PRN
Start: 2023-06-19 — End: 2023-09-05

## 2023-06-19 NOTE — Assessment & Plan Note (Signed)
 Well controlled. Continue current regimen. Follow up in  6 mo

## 2023-06-19 NOTE — Progress Notes (Signed)
 Established Patient Office Visit  Subjective  Patient ID: Tina Navarro, female    DOB: 03/26/1960  Age: 63 y.o. MRN: [id number]  Chief Complaint  Patient presents with   Hypertension    HPI  Hypertension- Pt denies chest pain, SOB, dizziness, or heart palpitations.  Taking meds as directed w/o problems.  Denies medication side effects.    Impaired fasting glucose-no increased thirst or urination. No symptoms consistent with hypoglycemia.  Complain of bilateral mid back pain for about 2 weeks.  No specific trauma.  But she was lifting some heavy things while she went to visit her mom and was helping take care of her for a few weeks.  She says it got worse after the 8-hour car ride home.  She took her husband's methocarbamol and got a little bit of relief she says it feels a little better today.    ROS    Objective:     BP 121/69   Pulse 84   Ht 5\' 5"  (1.651 m)   Wt 174 lb (78.9 kg)   LMP  (LMP Unknown)   SpO2 97%   BMI 28.96 kg/m    Physical Exam Vitals and nursing note reviewed.  Constitutional:      Appearance: Normal appearance.  HENT:     Head: Normocephalic and atraumatic.  Eyes:     Conjunctiva/sclera: Conjunctivae normal.  Cardiovascular:     Rate and Rhythm: Normal rate and regular rhythm.  Pulmonary:     Effort: Pulmonary effort is normal.     Breath sounds: Normal breath sounds.  Musculoskeletal:     Comments: Normal lumbar flexion some pain with extension particularly on the right side pain with rotation to the left.  But symmetric rotation bilaterally.  She is mildly tender over that mid back area bilaterally nontender over the SI joints and nontender directly over the spine.  Skin:    General: Skin is warm and dry.  Neurological:     Mental Status: She is alert.  Psychiatric:        Mood and Affect: Mood normal.      Results for orders placed or performed in visit on 06/19/23  POCT HgB A1C  Result Value Ref Range   Hemoglobin A1C 5.6 4.0  - 5.6 %   HbA1c POC (<> result, manual entry)     HbA1c, POC (prediabetic range)     HbA1c, POC (controlled diabetic range)        The ASCVD Risk score (Arnett DK, et al., 2019) failed to calculate for the following reasons:   The valid total cholesterol range is 130 to 320 mg/dL    Assessment & Plan:   Problem List Items Addressed This Visit       Cardiovascular and Mediastinum   Essential hypertension   Well controlled. Continue current regimen. Follow up in  2mo       Relevant Orders   CMP14+EGFR   Lipid panel   CBC     Endocrine   IFG (impaired fasting glucose) - Primary   A1C is much better today.  She is down to 5.6 she says she is really been trying with her diet.  Plan to recheck again in 6 months.      Relevant Orders   POCT HgB A1C (Completed)   CMP14+EGFR   Lipid panel   CBC   Other Visit Diagnoses       Mid back pain       Relevant Medications  tiZANidine  (ZANAFLEX ) 4 MG capsule   Other Relevant Orders   Ambulatory referral to Physical Therapy       Mid back pain-will refer for formal physical therapy also give her some home stretches to start while she is waiting to start formal PT.  Trial of a muscle relaxer in the evening.  Return in about 6 months (around 12/19/2023) for Hypertension, Pre-diabetes.    Duaine German, MD

## 2023-06-19 NOTE — Therapy (Unsigned)
 OUTPATIENT PHYSICAL THERAPY CERVICAL EVALUATION   Patient Name: Tina Navarro MRN: [id number] DOB:07-25-1960, 63 y.o., female Today's Date: 06/20/2023  END OF SESSION:  PT End of Session - 06/20/23 1119     Visit Number 1    Number of Visits 16    Date for PT Re-Evaluation 08/15/23    Authorization Type [employer]BS federal and [employer]    PT Start Time 1015    PT Stop Time 1100    PT Time Calculation (min) 45 min    Activity Tolerance Patient tolerated treatment well             Past Medical History:  Diagnosis Date   Anxiety    Basilar artery aneurysm (HCC)    s/p stent supported coil embolization of basilar aneurysm 12/02/20   COVID-19 10/16/2022   Dyspnea    Emphysematous COPD (HCC) 04/23/2017   GERD (gastroesophageal reflux disease)    Hyperlipidemia    Hypertension    Peripheral arterial disease (HCC)    Thyroid  nodule 04/23/2017   Past Surgical History:  Procedure Laterality Date   ABDOMINAL AORTOGRAM N/A 07/16/2017   Procedure: ABDOMINAL AORTOGRAM;  Surgeon: Margherita Shell, MD;  Location: [facility];  Service: Cardiovascular;  Laterality: N/A;   ABDOMINAL AORTOGRAM W/LOWER EXTREMITY Bilateral 12/02/2018   Procedure: ABDOMINAL AORTOGRAM W/LOWER EXTREMITY;  Surgeon: Margherita Shell, MD;  Location: [facility];  Service: Cardiovascular;  Laterality: Bilateral;   AORTIC ARCH ANGIOGRAPHY N/A 12/02/2018   Procedure: AORTIC ARCH ANGIOGRAPHY;  Surgeon: Margherita Shell, MD;  Location: [facility];  Service: Cardiovascular;  Laterality: N/A;   basilaraneurysm repair  11/2020   CAROTID-SUBCLAVIAN BYPASS GRAFT Left 02/22/2021   Procedure: LEFT CAROTID-SUBCLAVIAN BYPASS USING HEAMSHIELD GOLD X30MM;  Surgeon: Margherita Shell, MD;  Location: [facility] OR;  Service: Vascular;  Laterality: Left;   ENDOMETRIAL BIOPSY  2005   FEMORAL ARTERY STENT Bilateral 2008   IR 3D INDEPENDENT WKST  12/02/2020   IR ANGIO INTRA EXTRACRAN SEL INTERNAL CAROTID UNI R MOD SED   11/08/2020   IR ANGIO VERTEBRAL SEL SUBCLAVIAN INNOMINATE UNI R MOD SED  11/08/2020   IR ANGIO VERTEBRAL SEL VERTEBRAL BILAT MOD SED  12/02/2020   IR ANGIO VERTEBRAL SEL VERTEBRAL UNI R MOD SED  06/16/2021   IR ANGIOGRAM FOLLOW UP STUDY  12/02/2020   IR ANGIOGRAM FOLLOW UP STUDY  12/02/2020   IR ANGIOGRAM FOLLOW UP STUDY  12/02/2020   IR ANGIOGRAM FOLLOW UP STUDY  12/02/2020   IR TRANSCATH/EMBOLIZ  12/02/2020   IR US  GUIDE VASC ACCESS RIGHT  11/08/2020   IR US  GUIDE VASC ACCESS RIGHT  12/02/2020   IR US  GUIDE VASC ACCESS RIGHT  06/16/2021   IR US  GUIDE VASC ACCESS RIGHT  06/16/2021   LOWER EXTREMITY ANGIOGRAPHY Bilateral 07/16/2017   Procedure: Lower Extremity Angiography;  Surgeon: Margherita Shell, MD;  Location: [facility];  Service: Cardiovascular;  Laterality: Bilateral;   NASAL SINUS SURGERY     PERIPHERAL VASCULAR BALLOON ANGIOPLASTY Bilateral 07/16/2017   Procedure: PERIPHERAL VASCULAR BALLOON ANGIOPLASTY;  Surgeon: Margherita Shell, MD;  Location: [facility];  Service: Cardiovascular;  Laterality: Bilateral;  iliacs   PERIPHERAL VASCULAR INTERVENTION Bilateral 12/02/2018   Procedure: PERIPHERAL VASCULAR INTERVENTION;  Surgeon: Margherita Shell, MD;  Location: [facility];  Service: Cardiovascular;  Laterality: Bilateral;   RADIOLOGY WITH ANESTHESIA N/A 12/02/2020   Procedure: Arteriogram, Surpass embolization of basilar aneurysm;  Surgeon: Augusto Blonder,  MD;  Location: [facility];  Service: Radiology;  Laterality: N/A;   TONSILLECTOMY     TUBAL LIGATION     VASCULAR SURGERY     Patient Active Problem List   Diagnosis Date Noted   Age-related osteoporosis without current pathological fracture 12/19/2022   OSA (obstructive sleep apnea) 08/29/2022   IFG (impaired fasting glucose) 08/29/2022   History of basal cell cancer, nose 10/12/2021   Thoracic aortic ectasia (HCC) 08/17/2021   Left subclavian artery occlusion 02/22/2021   Aneurysm, cerebral,  nonruptured 12/02/2020   Anxiety state 12/25/2017   Thyroid  nodule 04/23/2017   Emphysematous COPD (HCC) 04/23/2017   Neuropathy involving both lower extremities 04/22/2017   Pleuritic chest pain 04/22/2017   Essential hypertension 05/01/2016   Abnormal findings on esophagogastroduodenoscopy (EGD) 03/18/2016   BPPV (benign paroxysmal positional vertigo) 06/28/2015   Irritable bowel syndrome 04/01/2015   Dyslipidemia, goal LDL below 70 04/01/2015   Multinodular goiter 12/19/2011   Constipation 07/28/2011   Dyslipidemia 07/28/2011   Acid reflux 09/25/2006   Peripheral vascular disease (HCC) 09/25/2006    PCP: Dr Cydney Draft  REFERRING PROVIDER:  Dr Cydney Draft  REFERRING DIAG: midback pain   THERAPY DIAG:  Mid back pain  Acute bilateral low back pain without sciatica  Other symptoms and signs involving the musculoskeletal system  Muscle weakness (generalized)  Rationale for Evaluation and Treatment: Rehabilitation  ONSET DATE: 02/27/23  SUBJECTIVE:                                                                                                                                                                                                         SUBJECTIVE STATEMENT: Patient reports that she was lifting and moving boxes over the past few months and noticed gradual onset of mid to low back pain. Pain has persisted over the past 4 months. Has pain with moving sit to stand; prolonged standing; lifting. Feels like she walks on the side of the R foot.   Hand dominance: Right  PERTINENT HISTORY:  HPI: coronary calcification; history of peripheral vascular disease; angioplasty and stent of her left common iliac artery and right common iliac artery.  Left subclavian was occluded. Nuclear study 8/22 showed EF 76 with normal perfusion. Had left carotid subclavian bypass 12/22.  Chest CT May 2023 showed atherosclerosis in the thoracic aorta and coronary calcification;  ectatic ascending aorta 3.5 cm.  Carotid Dopplers 10/24 showed 40-59% right and 1 to 39% left stenosis. Dopplers 10/24 showed patent stents and ABIs normal. Since last seen, she denies dyspnea, chest pain,  palpitations or syncope. Brain aneurysm w/ surgery 10/22  PAIN:  Are you having pain? Yes: NPRS scale: 1-2/10; in past week 10/10  Pain location: mid to low back bilat  Pain description: sharp and dull  Aggravating factors: moving in certain ways; moving sit to stand; standing > 10 min  Relieving factors: nothing; sometimes changing positions; heat; meds   PRECAUTIONS: CAD; atherosclerosis in the thoracic aorta and coronary calcification  RED FLAGS: None     WEIGHT BEARING RESTRICTIONS: No  FALLS:  Has patient fallen in last 6 months? 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 chores; crocheting; reading   PLOF: Independent  PATIENT GOALS: get back better; move sit to stand without pain; walk right   NEXT MD VISIT: 12/19/23  OBJECTIVE:  Note: Objective measures were completed at Evaluation unless otherwise noted.  DIAGNOSTIC FINDINGS:  CT scan 10/18/21: FINDINGS: Targeted sonography of the xiphoid region is performed with the patient reports a lump. Isoechoic oblong solid area within the deep soft tissues, this measures 14 by 1.4 x 8.7 cm.   IMPRESSION: 14 cm oblong circumscribed I so to slightly hypoechoic mass or soft tissue area within the deep soft tissues in the xiphoid region, appearance is nonspecific and suggestive of fatty echogenicity. No suspicious findings in the region on CT from May of 2023.  PATIENT SURVEYS:  Modified Oswestry 31/50; 62%   COGNITION: Overall cognitive status: Within functional limits for  tasks assessed  SENSATION: Bilat lower back to posterior buttocks to proximal posterior thighs  POSTURE: rounded shoulders, forward head, decreased lumbar lordosis, increased thoracic kyphosis, flexed trunk , and LE's in ER; standing on lateral border of feet R > L   PALPATION: Muscular tightness bilat thoracic and lumbar paraspinals into the lats    CERVICAL ROM:   Active ROM A/PROM (deg) eval  Flexion 75% pull  Extension 35% pull   Right lateral flexion 80% pull   Left lateral flexion 86% cramp L back   Right rotation 70% tight R > L   Left rotation 75%    (Blank rows = not tested)  LOWER EXTREMITY ROM: end range tightness hip extension    LOWER EXTREMITY MMT:  MMT Right eval Left eval  Hip flexion 4- 4  Hip extension 3 3  Hip abduction 4- 4                            LUMBAR SPECIAL TESTS:  Sitting slump test (-) SLR   FUNCTIONAL TESTS:  5 times sit to stand: 17.91 sec SLS; 10 sec R/L easier on L   TREATMENT DATE: 06/20/23 Review of findings and POC Transfers and transitional movements HEP Myofacial release T/L spine patient standing   Noodle to improve sitting posture/ alignment  PATIENT EDUCATION:  Education details: POC; HEP Person educated: Patient Education method: Programmer, multimedia, Demonstration, Actor cues, Verbal cues, and Handouts Education comprehension: verbalized understanding, returned demonstration, verbal cues required, tactile cues required, and needs further education  HOME EXERCISE PROGRAM: Access Code: [email or web address] URL: [email or web address] Date: 06/20/2023 Prepared by: Lachae Hohler  Exercises - Supine Double Knee to Chest  - 2 x daily - 7 x weekly - 1 sets - 3 reps - 30 sec  hold - Supine Transversus Abdominis Bracing with Pelvic Floor Contraction  - 2 x daily - 7 x weekly - 1 sets - 10 reps -  10sec  hold - Standing Piriformis Release with Ball at Wall  - 2 x daily - 7 x weekly - 30-60 sec  hold  Patient Education - Hospital doctor - Office Posture  ASSESSMENT:  CLINICAL IMPRESSION: Patient is a 63 y.o. female who was seen today for physical therapy evaluation and treatment for midback pain. She has muscular tightness and spasms through the thoracic and lumbar paraspinals most notably through the thoracolumbar junction. She has muscular tightness to palpation in this area as well as spasms and pain with transfers and transitional movements. She has limited trunk and LE ROM; decreased strength in core and LEs. Pain limits transitional movements especially sit to stand and rolling over in bed as well as with functional activities. Patient will benefit from physical therapy to address problems identified.   OBJECTIVE IMPAIRMENTS: Abnormal gait, decreased activity tolerance, decreased mobility, decreased ROM, decreased strength, increased fascial restrictions, increased muscle spasms, impaired flexibility, improper body mechanics, postural dysfunction, and pain.   ACTIVITY LIMITATIONS: carrying, lifting, bending, sitting, standing, squatting, sleeping, stairs, transfers, bed mobility, and locomotion level  PARTICIPATION LIMITATIONS: meal prep, cleaning, laundry, driving, shopping, community activity, and occupation  PERSONAL FACTORS: Behavior pattern, Fitness, Profession, Time since onset of injury/illness/exacerbation, and comorbidities are also affecting patient's functional outcome.   REHAB POTENTIAL: Good  CLINICAL DECISION MAKING: Evolving/moderate complexity  EVALUATION COMPLEXITY: Moderate   GOALS: Goals reviewed with patient? Yes  SHORT TERM GOALS: Target date: 07/18/2023   Independent in initial HEP  Baseline:  Goal status: INITIAL  2.  Patient reports ability to roll or turn in bed with minimal to no increase in pain Baseline:  Goal status:  INITIAL  3.  Patient reports ability to move from sit to stand with minimal to no pain  Baseline:  Goal status: INITIAL  LONG TERM GOALS: Target date: 08/15/2023  Decrease pain in thoracic and lumbar spine by 50-75% allowing patient to return to normal functional activities  Baseline:  Goal status: INITIAL  2.  Increase strength bilat LE's to 4/5 hip extension and 4+/5 to 5/5 hip flexion and abduction to improve functional activities and ADL's Baseline:  Goal status: INITIAL  3.  Increase core strength and stability with patient to tolerate 20-30 min of strengthening exercises with minimal to no increase in pain Baseline:  Goal status: INITIAL  4.  Patient demonstrates and reports proper back care and body mechanics with transitional movements as well as ergonomic modifications for desk/computer work Baseline:  Goal status: INITIAL  5.  Independent in HEP including aquatic therapy program as indicated  Baseline:  Goal status: INITIAL  6.  Improve Modified Owestry score by 10-20 points  Baseline: 31/50; 62% Goal status: INITIAL   PLAN:  PT FREQUENCY: 2x/week  PT DURATION: 8 weeks  PLANNED INTERVENTIONS: 97110-Therapeutic exercises, 97530- Therapeutic activity, [id number]- Neuromuscular re-education, 97535- Self Care, [id number]- Manual therapy, [id number]-  Aquatic Therapy, Patient/Family education, Taping, Dry Needling, and Joint mobilization  PLAN FOR NEXT SESSION: review and progress exercises; continue spine care and ergonomic education; manual work and modalities as indicated    Shirley Decamp P Rhys Lichty, PT 06/20/2023, 12:05 PM

## 2023-06-19 NOTE — Assessment & Plan Note (Signed)
 A1C is much better today.  She is down to 5.6 she says she is really been trying with her diet.  Plan to recheck again in 6 months.

## 2023-06-19 NOTE — Patient Instructions (Signed)
 For your mid back pain okay to take an Aleve twice a day as needed.  I also sent over a muscle relaxer.  These can be sedating so I usually recommend just using them in the evening or at bedtime.  They can be taken up to 3 times a day if needed.  Will place a referral for formal physical therapy.

## 2023-06-20 ENCOUNTER — Other Ambulatory Visit: Payer: Self-pay

## 2023-06-20 ENCOUNTER — Ambulatory Visit: Attending: Family Medicine | Admitting: Rehabilitative and Restorative Service Providers"

## 2023-06-20 ENCOUNTER — Encounter: Payer: Self-pay | Admitting: Family Medicine

## 2023-06-20 ENCOUNTER — Encounter: Payer: Self-pay | Admitting: Rehabilitative and Restorative Service Providers"

## 2023-06-20 DIAGNOSIS — M549 Dorsalgia, unspecified: Secondary | ICD-10-CM | POA: Insufficient documentation

## 2023-06-20 DIAGNOSIS — M545 Low back pain, unspecified: Secondary | ICD-10-CM | POA: Insufficient documentation

## 2023-06-20 DIAGNOSIS — R29898 Other symptoms and signs involving the musculoskeletal system: Secondary | ICD-10-CM | POA: Diagnosis not present

## 2023-06-20 DIAGNOSIS — M6281 Muscle weakness (generalized): Secondary | ICD-10-CM | POA: Insufficient documentation

## 2023-06-20 LAB — CMP14+EGFR
ALT: 22 IU/L (ref 0–32)
AST: 29 IU/L (ref 0–40)
Albumin: 4.3 g/dL (ref 3.9–4.9)
Alkaline Phosphatase: 148 IU/L — ABNORMAL HIGH (ref 44–121)
BUN/Creatinine Ratio: 18 (ref 12–28)
BUN: 14 mg/dL (ref 8–27)
Bilirubin Total: <0.2 mg/dL (ref 0.0–1.2)
CO2: 20 mmol/L (ref 20–29)
Calcium: 9.1 mg/dL (ref 8.7–10.3)
Chloride: 103 mmol/L (ref 96–106)
Creatinine, Ser: 0.78 mg/dL (ref 0.57–1.00)
Globulin, Total: 2.3 g/dL (ref 1.5–4.5)
Glucose: 74 mg/dL (ref 70–99)
Potassium: 4 mmol/L (ref 3.5–5.2)
Sodium: 142 mmol/L (ref 134–144)
Total Protein: 6.6 g/dL (ref 6.0–8.5)
eGFR: 86 mL/min/{1.73_m2} (ref >59)

## 2023-06-20 LAB — CBC
Hematocrit: 37.6 % (ref 34.0–46.6)
Hemoglobin: 11.3 g/dL (ref 11.1–15.9)
MCH: 24.2 pg — ABNORMAL LOW (ref 26.6–33.0)
MCHC: 30.1 g/dL — ABNORMAL LOW (ref 31.5–35.7)
MCV: 81 fL (ref 79–97)
Platelets: 422 10*3/uL (ref 150–450)
RBC: 4.66 x10E6/uL (ref 3.77–5.28)
RDW: 16.3 % — ABNORMAL HIGH (ref 11.7–15.4)
WBC: 7.7 10*3/uL (ref 3.4–10.8)

## 2023-06-20 LAB — LIPID PANEL
Chol/HDL Ratio: 3.5 ratio (ref 0.0–4.4)
Cholesterol, Total: 128 mg/dL (ref 100–199)
HDL: 37 mg/dL — ABNORMAL LOW (ref >39)
LDL Chol Calc (NIH): 59 mg/dL (ref 0–99)
Triglycerides: 195 mg/dL — ABNORMAL HIGH (ref 0–149)
VLDL Cholesterol Cal: 32 mg/dL (ref 5–40)

## 2023-06-20 NOTE — Progress Notes (Signed)
 Hi Tina Navarro, alkaline phosphatase is still up a little bit but AST and ALT are normal.  Will keep an eye on it.  Triglycerides are still up but look a little bit better than 6 months ago.  LDL looks great.  Continue to work on healthy diet and regular exercise.  Hemoglobin is stable.  No sign of infection.

## 2023-06-24 ENCOUNTER — Encounter: Payer: Self-pay | Admitting: Rehabilitative and Restorative Service Providers"

## 2023-06-24 ENCOUNTER — Encounter: Payer: Self-pay | Admitting: Family Medicine

## 2023-06-24 ENCOUNTER — Ambulatory Visit: Admitting: Rehabilitative and Restorative Service Providers"

## 2023-06-24 ENCOUNTER — Telehealth: Payer: Self-pay

## 2023-06-24 DIAGNOSIS — M549 Dorsalgia, unspecified: Secondary | ICD-10-CM

## 2023-06-24 DIAGNOSIS — M545 Low back pain, unspecified: Secondary | ICD-10-CM

## 2023-06-24 DIAGNOSIS — R29898 Other symptoms and signs involving the musculoskeletal system: Secondary | ICD-10-CM

## 2023-06-24 DIAGNOSIS — M6281 Muscle weakness (generalized): Secondary | ICD-10-CM | POA: Diagnosis not present

## 2023-06-24 NOTE — Telephone Encounter (Signed)
 Yes, see note below. Xray was ordered.

## 2023-06-24 NOTE — Telephone Encounter (Signed)
 Patient came into office to see if she could get an Xray due to her back pain getting worst, please advise, thanks.

## 2023-06-24 NOTE — Therapy (Addendum)
 OUTPATIENT PHYSICAL THERAPY CERVICAL EVALUATION   Patient Name: Tina Navarro MRN: [id number] DOB:[birth year], 63 y.o., female Today's Date: 06/24/2023  END OF SESSION:  PT End of Session - 06/24/23 1146     Visit Number 2    Number of Visits 16    Date for PT Re-Evaluation 08/15/23    Authorization Type BCBS federal and [employer]    PT Start Time 1146    PT Stop Time 1230    PT Time Calculation (min) 44 min             Past Medical History:  Diagnosis Date   Anxiety    Basilar artery aneurysm (HCC)    s/p stent supported coil embolization of basilar aneurysm 12/02/20   COVID-19 10/16/2022   Dyspnea    Emphysematous COPD (HCC) 04/23/2017   GERD (gastroesophageal reflux disease)    Hyperlipidemia    Hypertension    Peripheral arterial disease (HCC)    Thyroid  nodule 04/23/2017   Past Surgical History:  Procedure Laterality Date   ABDOMINAL AORTOGRAM N/A 07/16/2017   Procedure: ABDOMINAL AORTOGRAM;  Surgeon: Margherita Shell, MD;  Location: [facility];  Service: Cardiovascular;  Laterality: N/A;   ABDOMINAL AORTOGRAM W/LOWER EXTREMITY Bilateral 12/02/2018   Procedure: ABDOMINAL AORTOGRAM W/LOWER EXTREMITY;  Surgeon: Margherita Shell, MD;  Location: [facility];  Service: Cardiovascular;  Laterality: Bilateral;   AORTIC ARCH ANGIOGRAPHY N/A 12/02/2018   Procedure: AORTIC ARCH ANGIOGRAPHY;  Surgeon: Margherita Shell, MD;  Location: [facility];  Service: Cardiovascular;  Laterality: N/A;   basilaraneurysm repair  11/2020   CAROTID-SUBCLAVIAN BYPASS GRAFT Left 02/22/2021   Procedure: LEFT CAROTID-SUBCLAVIAN BYPASS USING HEAMSHIELD GOLD X30MM;  Surgeon: Margherita Shell, MD;  Location: [facility] OR;  Service: Vascular;  Laterality: Left;   ENDOMETRIAL BIOPSY  2005   FEMORAL ARTERY STENT Bilateral 2008   IR 3D INDEPENDENT WKST  12/02/2020   IR ANGIO INTRA EXTRACRAN SEL INTERNAL CAROTID UNI R MOD SED  11/08/2020   IR ANGIO VERTEBRAL SEL SUBCLAVIAN  INNOMINATE UNI R MOD SED  11/08/2020   IR ANGIO VERTEBRAL SEL VERTEBRAL BILAT MOD SED  12/02/2020   IR ANGIO VERTEBRAL SEL VERTEBRAL UNI R MOD SED  06/16/2021   IR ANGIOGRAM FOLLOW UP STUDY  12/02/2020   IR ANGIOGRAM FOLLOW UP STUDY  12/02/2020   IR ANGIOGRAM FOLLOW UP STUDY  12/02/2020   IR ANGIOGRAM FOLLOW UP STUDY  12/02/2020   IR TRANSCATH/EMBOLIZ  12/02/2020   IR US  GUIDE VASC ACCESS RIGHT  11/08/2020   IR US  GUIDE VASC ACCESS RIGHT  12/02/2020   IR US  GUIDE VASC ACCESS RIGHT  06/16/2021   IR US  GUIDE VASC ACCESS RIGHT  06/16/2021   LOWER EXTREMITY ANGIOGRAPHY Bilateral 07/16/2017   Procedure: Lower Extremity Angiography;  Surgeon: Margherita Shell, MD;  Location: [facility];  Service: Cardiovascular;  Laterality: Bilateral;   NASAL SINUS SURGERY     PERIPHERAL VASCULAR BALLOON ANGIOPLASTY Bilateral 07/16/2017   Procedure: PERIPHERAL VASCULAR BALLOON ANGIOPLASTY;  Surgeon: Margherita Shell, MD;  Location: [facility];  Service: Cardiovascular;  Laterality: Bilateral;  iliacs   PERIPHERAL VASCULAR INTERVENTION Bilateral 12/02/2018   Procedure: PERIPHERAL VASCULAR INTERVENTION;  Surgeon: Margherita Shell, MD;  Location: [facility];  Service: Cardiovascular;  Laterality: Bilateral;   RADIOLOGY WITH ANESTHESIA N/A 12/02/2020   Procedure: Arteriogram, Surpass embolization of basilar aneurysm;  Surgeon: Augusto Blonder, MD;  Location: [facility] OR;  Service: Radiology;  Laterality: N/A;   TONSILLECTOMY     TUBAL LIGATION     VASCULAR SURGERY     Patient Active Problem List   Diagnosis Date Noted   Age-related osteoporosis without current pathological fracture 12/19/2022   OSA (obstructive sleep apnea) 08/29/2022   IFG (impaired fasting glucose) 08/29/2022   History of basal cell cancer, nose 10/12/2021   Thoracic aortic ectasia (HCC) 08/17/2021   Left subclavian artery occlusion 02/22/2021   Aneurysm, cerebral, nonruptured 12/02/2020   Anxiety state 12/25/2017    Thyroid  nodule 04/23/2017   Emphysematous COPD (HCC) 04/23/2017   Neuropathy involving both lower extremities 04/22/2017   Pleuritic chest pain 04/22/2017   Essential hypertension 05/01/2016   Abnormal findings on esophagogastroduodenoscopy (EGD) 03/18/2016   BPPV (benign paroxysmal positional vertigo) 06/28/2015   Irritable bowel syndrome 04/01/2015   Dyslipidemia, goal LDL below 70 04/01/2015   Multinodular goiter 12/19/2011   Constipation 07/28/2011   Dyslipidemia 07/28/2011   Acid reflux 09/25/2006   Peripheral vascular disease (HCC) 09/25/2006    PCP: Dr Cydney Draft  REFERRING PROVIDER:  Dr Cydney Draft  REFERRING DIAG: midback pain   THERAPY DIAG:  Mid back pain  Acute bilateral low back pain without sciatica  Other symptoms and signs involving the musculoskeletal system  Muscle weakness (generalized)  Rationale for Evaluation and Treatment: Rehabilitation  ONSET DATE: 02/27/23  SUBJECTIVE:                                                                                                                                                                                                         SUBJECTIVE STATEMENT: Patient reports that she has had significant increase in pain since beginning exercises in therapy. She did the exercises Friday and Saturday but felt the exercises increased the pain. She went into see Dr Greer Leak to request an xray. Interested in trying TENS unit. She reports decreased pain, back is no longer 'throbbing' in the back with TENS. Pain is decreased to 1/10 following 30 min use of TENS.    EVAL: Patient reports that she was lifting and moving boxes over the past few months and noticed gradual onset of mid to low back pain. Pain has persisted over the past 4 months. Has pain with moving sit to stand; prolonged standing; lifting. Feels like she walks on the side of the R foot.   Hand dominance: Right  PERTINENT HISTORY:  HPI:  coronary calcification; history of peripheral vascular disease; angioplasty and stent of her left common iliac artery and right common iliac artery.  Left subclavian was  occluded. Nuclear study 8/22 showed EF 76 with normal perfusion. Had left carotid subclavian bypass 12/22.  Chest CT May 2023 showed atherosclerosis in the thoracic aorta and coronary calcification; ectatic ascending aorta 3.5 cm.  Carotid Dopplers 10/24 showed 40-59% right and 1 to 39% left stenosis. Dopplers 10/24 showed patent stents and ABIs normal. Since last seen, she denies dyspnea, chest pain, palpitations or syncope. Brain aneurysm w/ surgery 10/22  PAIN:  Are you having pain? Yes: NPRS scale: 10/10; in past week 10/10  Pain location: mid to low back bilat  Pain description: sharp and dull  Aggravating factors: moving in certain ways; moving sit to stand; standing > 10 min  Relieving factors: nothing; sometimes changing positions; heat; meds   PRECAUTIONS: CAD; atherosclerosis in the thoracic aorta and coronary calcification  WEIGHT BEARING RESTRICTIONS: No  FALLS:  Has patient fallen in last 6 months? 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 chores; crocheting; reading  PATIENT GOALS: get back better; move sit to stand without pain; walk right   NEXT MD VISIT: 12/19/23  OBJECTIVE:  Note: Objective measures were completed at Evaluation unless otherwise noted.  DIAGNOSTIC FINDINGS:  CT scan 10/18/21: FINDINGS: Targeted sonography of the xiphoid region is performed with the patient reports a lump. Isoechoic oblong solid area within the deep soft tissues, this measures 14 by 1.4 x 8.7 cm.   IMPRESSION: 14 cm oblong circumscribed I so to slightly hypoechoic mass or  soft tissue area within the deep soft tissues in the xiphoid region, appearance is nonspecific and suggestive of fatty echogenicity. No suspicious findings in the region on CT from May of 2023.  PATIENT SURVEYS:  Modified Oswestry 31/50; 62%   SENSATION: Bilat lower back to posterior buttocks to proximal posterior thighs  POSTURE: rounded shoulders, forward head, decreased lumbar lordosis, increased thoracic kyphosis, flexed trunk , and LE's in ER; standing on lateral border of feet R > L   PALPATION: Muscular tightness bilat thoracic and lumbar paraspinals into the lats    CERVICAL ROM:   Active ROM A/PROM (deg) eval  Flexion 75% pull  Extension 35% pull   Right lateral flexion 80% pull   Left lateral flexion 86% cramp L back   Right rotation 70% tight R > L   Left rotation 75%    (Blank rows = not tested)  LOWER EXTREMITY ROM: end range tightness hip extension    LOWER EXTREMITY MMT:  MMT Right eval Left eval  Hip flexion 4- 4  Hip extension 3 3  Hip abduction 4- 4                            LUMBAR SPECIAL TESTS:  Sitting slump test (-) SLR   FUNCTIONAL TESTS:  5 times sit to stand: 17.91 sec SLS; 10 sec R/L easier on L    [facility] Adult PT Treatment:                                                DATE: 06/24/23 Therapeutic Activity: Diaphragmatic breathing instruction  Modalities: TENS unit adjusted to  patient tolerance x 30 min  Moist heat thoracolumbar spine patient sitting  Self Care: Education re- pain and pain threshold Use of diaphragmatic breathing  Exercises with pain - patient will hold exercises pending f/u with MD     TREATMENT DATE: 06/20/23 Review of findings and POC Transfers and transitional movements HEP Myofacial release T/L spine patient standing   Noodle to improve sitting posture/ alignment                                                                                                                                  PATIENT EDUCATION:  Education details: POC; HEP Person educated: Patient Education method: Programmer, multimedia, Demonstration, Tactile cues, Verbal cues, and Handouts Education comprehension: verbalized understanding, returned demonstration, verbal cues required, tactile cues required, and needs further education  HOME EXERCISE PROGRAM:  Access Code: [email or web address] URL: [email or web address] Date: 06/24/2023 Prepared by: Trinidi Toppins  Exercises - Supine Double Knee to Chest  - 2 x daily - 7 x weekly - 1 sets - 3 reps - 30 sec  hold - Supine Transversus Abdominis Bracing with Pelvic Floor Contraction  - 2 x daily - 7 x weekly - 1 sets - 10 reps - 10sec  hold - Standing Piriformis Release with Ball at Wall  - 2 x daily - 7 x weekly - 30-60 sec  hold - Supine Diaphragmatic Breathing  - 2 x daily - 7 x weekly - 1 sets - 10 reps - 4-6 sec  hold  Patient Education - Hospital doctor - Office Posture - TENS Unit   ASSESSMENT:  CLINICAL IMPRESSION: Patient returns reporting significant increase in pain with exercises from PT at evaluation. Adelynne only did two gentle exercises that should not create the significant increase in pain she experienced. She has requested x-rays form Dr Greer Leak. Trial of TENS unit produced some decrease in pain during trial in clinic. Provided patient with information on ordering TENS for home. Will hold PT pending follow up with Dr Greer Leak.   EVAL: Patient is a 63 y.o. female who was seen today for physical therapy evaluation and treatment for midback pain. She has muscular tightness and spasms through the thoracic and lumbar paraspinals most notably through the thoracolumbar junction. She has muscular tightness to palpation in this area as well as spasms and pain with transfers and transitional movements. She has limited trunk and LE ROM; decreased strength in core and LEs. Pain limits transitional movements especially sit to stand and rolling over  in bed as well as with functional activities. Patient will benefit from physical therapy to address problems identified.   OBJECTIVE IMPAIRMENTS: Abnormal gait, decreased activity tolerance, decreased mobility, decreased ROM, decreased strength, increased fascial restrictions, increased muscle spasms, impaired flexibility, improper body mechanics, postural dysfunction, and pain.    GOALS: Goals reviewed with patient? Yes  SHORT TERM GOALS: Target date: 07/18/2023   Independent in initial HEP  Baseline:  Goal status:  INITIAL  2.  Patient reports ability to roll or turn in bed with minimal to no increase in pain Baseline:  Goal status: INITIAL  3.  Patient reports ability to move from sit to stand with minimal to no pain  Baseline:  Goal status: INITIAL  LONG TERM GOALS: Target date: 08/15/2023  Decrease pain in thoracic and lumbar spine by 50-75% allowing patient to return to normal functional activities  Baseline:  Goal status: INITIAL  2.  Increase strength bilat LE's to 4/5 hip extension and 4+/5 to 5/5 hip flexion and abduction to improve functional activities and ADL's Baseline:  Goal status: INITIAL  3.  Increase core strength and stability with patient to tolerate 20-30 min of strengthening exercises with minimal to no increase in pain Baseline:  Goal status: INITIAL  4.  Patient demonstrates and reports proper back care and body mechanics with transitional movements as well as ergonomic modifications for desk/computer work Baseline:  Goal status: INITIAL  5.  Independent in HEP including aquatic therapy program as indicated  Baseline:  Goal status: INITIAL  6.  Improve Modified Owestry score by 10-20 points  Baseline: 31/50; 62% Goal status: INITIAL   PLAN:  PT FREQUENCY: 2x/week  PT DURATION: 8 weeks  PLANNED INTERVENTIONS: 97110-Therapeutic exercises, 97530- Therapeutic activity, 97112- Neuromuscular re-education, 97535- Self Care, 16109- Manual therapy,  [phone]- Aquatic Therapy, Patient/Family education, Taping, Dry Needling, and Joint mobilization  PLAN FOR NEXT SESSION: review and progress exercises; continue spine care and ergonomic education; manual work and modalities as indicated    Markon Jares P Jeromiah Ohalloran, PT 06/24/2023, 11:49 AM

## 2023-06-25 ENCOUNTER — Ambulatory Visit

## 2023-06-25 DIAGNOSIS — M549 Dorsalgia, unspecified: Secondary | ICD-10-CM

## 2023-06-25 DIAGNOSIS — M546 Pain in thoracic spine: Secondary | ICD-10-CM | POA: Diagnosis not present

## 2023-06-25 DIAGNOSIS — M419 Scoliosis, unspecified: Secondary | ICD-10-CM | POA: Diagnosis not present

## 2023-06-26 ENCOUNTER — Encounter

## 2023-06-26 ENCOUNTER — Other Ambulatory Visit: Payer: Self-pay | Admitting: Cardiology

## 2023-06-26 DIAGNOSIS — R072 Precordial pain: Secondary | ICD-10-CM

## 2023-06-26 DIAGNOSIS — E7800 Pure hypercholesterolemia, unspecified: Secondary | ICD-10-CM

## 2023-06-28 ENCOUNTER — Ambulatory Visit (INDEPENDENT_AMBULATORY_CARE_PROVIDER_SITE_OTHER): Admitting: Sports Medicine

## 2023-06-28 ENCOUNTER — Ambulatory Visit (INDEPENDENT_AMBULATORY_CARE_PROVIDER_SITE_OTHER)

## 2023-06-28 ENCOUNTER — Encounter: Payer: Self-pay | Admitting: Sports Medicine

## 2023-06-28 DIAGNOSIS — M47817 Spondylosis without myelopathy or radiculopathy, lumbosacral region: Secondary | ICD-10-CM | POA: Diagnosis not present

## 2023-06-28 DIAGNOSIS — M5136 Other intervertebral disc degeneration, lumbar region with discogenic back pain only: Secondary | ICD-10-CM | POA: Diagnosis not present

## 2023-06-28 DIAGNOSIS — M51369 Other intervertebral disc degeneration, lumbar region without mention of lumbar back pain or lower extremity pain: Secondary | ICD-10-CM | POA: Insufficient documentation

## 2023-06-28 DIAGNOSIS — M545 Low back pain, unspecified: Secondary | ICD-10-CM

## 2023-06-28 MED ORDER — PREDNISONE 50 MG PO TABS: ORAL_TABLET | ORAL | 0 refills | Status: DC

## 2023-06-28 MED ORDER — MELOXICAM 15 MG PO TABS: ORAL_TABLET | ORAL | 3 refills | Status: DC

## 2023-06-28 NOTE — Progress Notes (Signed)
    Procedures performed today:    None.  Independent interpretation of notes and tests performed by another provider:   I personally reviewed the thoracic spine x-rays that showed no evidence of acute compression fractures, very mild endplate spurring age-appropriate.  Brief History, Exam, Impression, and Recommendations:    Lumbar degenerative disc disease Pleasant 63 year old female, several weeks of increasing pain low back mid to upper lumbar spine, occasional radiation into the buttock worse with sitting flexion Valsalva. No red flag symptoms, we discussed the anatomy and evolutionary anthropology of lumbar disc disease, adding x-rays, 5 days of prednisone , followed by meloxicam. She was unable to do PT, I think we need to get her out of pain first and then we can revisit PT. I like to see her back in 2 to 3 weeks to reevaluate restarting therapy.    ____________________________________________ Joselyn Nicely. Sandy Crumb, M.D., ABFM., CAQSM., AME. Primary Care and Sports Medicine [facility]  Adjunct Professor of [facility] Medicine  [employer]  Restaurant manager, fast food

## 2023-06-28 NOTE — Assessment & Plan Note (Signed)
 Pleasant 63 year old female, several weeks of increasing pain low back mid to upper lumbar spine, occasional radiation into the buttock worse with sitting flexion Valsalva. No red flag symptoms, we discussed the anatomy and evolutionary anthropology of lumbar disc disease, adding x-rays, 5 days of prednisone , followed by meloxicam. She was unable to do PT, I think we need to get her out of pain first and then we can revisit PT. I like to see her back in 2 to 3 weeks to reevaluate restarting therapy.

## 2023-07-01 ENCOUNTER — Encounter

## 2023-07-02 ENCOUNTER — Encounter: Payer: Self-pay | Admitting: Family Medicine

## 2023-07-02 NOTE — Progress Notes (Signed)
 Hi Tina Navarro, x-ray of the mid back looks okay there is a little bit of curvature called scoliosis.  No other bone abnormalities which is at least reassuring.  I think you are going to follow-up with Dr. Elva Hamburger in a couple of weeks and he can take it from there as far as recommendations continue to work on gentle stretches.

## 2023-07-02 NOTE — Telephone Encounter (Signed)
 Patient has been advised of Lumbar and Thoracic Spine X-Ray results. Patient verbally stated comprehension of results and will be following up with Dr.T 07/19/2023.

## 2023-07-03 ENCOUNTER — Encounter

## 2023-07-08 ENCOUNTER — Encounter

## 2023-07-10 ENCOUNTER — Encounter: Admitting: Rehabilitative and Restorative Service Providers"

## 2023-07-17 ENCOUNTER — Encounter

## 2023-07-19 ENCOUNTER — Other Ambulatory Visit: Payer: Self-pay | Admitting: Cardiology

## 2023-07-19 ENCOUNTER — Ambulatory Visit: Payer: Self-pay

## 2023-07-19 ENCOUNTER — Ambulatory Visit (INDEPENDENT_AMBULATORY_CARE_PROVIDER_SITE_OTHER): Admitting: Sports Medicine

## 2023-07-19 ENCOUNTER — Ambulatory Visit

## 2023-07-19 ENCOUNTER — Encounter

## 2023-07-19 DIAGNOSIS — T782XXA Anaphylactic shock, unspecified, initial encounter: Secondary | ICD-10-CM | POA: Diagnosis not present

## 2023-07-19 DIAGNOSIS — X58XXXA Exposure to other specified factors, initial encounter: Secondary | ICD-10-CM | POA: Diagnosis not present

## 2023-07-19 DIAGNOSIS — R1032 Left lower quadrant pain: Secondary | ICD-10-CM | POA: Diagnosis not present

## 2023-07-19 DIAGNOSIS — M25552 Pain in left hip: Secondary | ICD-10-CM | POA: Diagnosis not present

## 2023-07-19 DIAGNOSIS — Z91030 Bee allergy status: Secondary | ICD-10-CM | POA: Diagnosis not present

## 2023-07-19 DIAGNOSIS — L299 Pruritus, unspecified: Secondary | ICD-10-CM | POA: Diagnosis not present

## 2023-07-19 DIAGNOSIS — Z7982 Long term (current) use of aspirin: Secondary | ICD-10-CM | POA: Diagnosis not present

## 2023-07-19 DIAGNOSIS — Z9889 Other specified postprocedural states: Secondary | ICD-10-CM | POA: Diagnosis not present

## 2023-07-19 DIAGNOSIS — R072 Precordial pain: Secondary | ICD-10-CM

## 2023-07-19 DIAGNOSIS — Z79899 Other long term (current) drug therapy: Secondary | ICD-10-CM | POA: Diagnosis not present

## 2023-07-19 DIAGNOSIS — K219 Gastro-esophageal reflux disease without esophagitis: Secondary | ICD-10-CM | POA: Diagnosis not present

## 2023-07-19 DIAGNOSIS — R1030 Lower abdominal pain, unspecified: Secondary | ICD-10-CM | POA: Diagnosis not present

## 2023-07-19 DIAGNOSIS — E785 Hyperlipidemia, unspecified: Secondary | ICD-10-CM | POA: Diagnosis not present

## 2023-07-19 DIAGNOSIS — F419 Anxiety disorder, unspecified: Secondary | ICD-10-CM | POA: Diagnosis not present

## 2023-07-19 DIAGNOSIS — R0602 Shortness of breath: Secondary | ICD-10-CM | POA: Diagnosis not present

## 2023-07-19 DIAGNOSIS — M5136 Other intervertebral disc degeneration, lumbar region with discogenic back pain only: Secondary | ICD-10-CM | POA: Diagnosis not present

## 2023-07-19 DIAGNOSIS — I10 Essential (primary) hypertension: Secondary | ICD-10-CM | POA: Diagnosis not present

## 2023-07-19 DIAGNOSIS — Z87891 Personal history of nicotine dependence: Secondary | ICD-10-CM | POA: Diagnosis not present

## 2023-07-19 DIAGNOSIS — R42 Dizziness and giddiness: Secondary | ICD-10-CM | POA: Diagnosis not present

## 2023-07-19 DIAGNOSIS — Z881 Allergy status to other antibiotic agents status: Secondary | ICD-10-CM | POA: Diagnosis not present

## 2023-07-19 DIAGNOSIS — R9431 Abnormal electrocardiogram [ECG] [EKG]: Secondary | ICD-10-CM | POA: Diagnosis not present

## 2023-07-19 MED ORDER — PREGABALIN 25 MG PO CAPS: ORAL_CAPSULE | ORAL | 3 refills | Status: DC

## 2023-07-19 NOTE — Progress Notes (Signed)
    Procedures performed today:    None.  Independent interpretation of notes and tests performed by another provider:   None.  Brief History, Exam, Impression, and Recommendations:    Lumbar degenerative disc disease Persistent right sided axial low back pain, improved with the previous measures. Though still present. X-rays did show L5-S1 DDD. We will start a low-dose Lyrica up titration, I would like to go and get the lumbar spine MRI for procedural planning. Adding some formal PT. Return to see me in 6 more weeks.  Left groin pain Years of pain left groin, not too bad with internal rotation, she does have some reproduction of pain with resisted flexion of the hip. Adding PT, x-rays, return to see me in 6 weeks. MRI if no better.    ____________________________________________ Joselyn Nicely. Sandy Crumb, M.D., ABFM., CAQSM., AME. Primary Care and Sports Medicine [facility]  Adjunct Professor of [facility] Medicine  [employer]  Restaurant manager, fast food

## 2023-07-19 NOTE — Assessment & Plan Note (Signed)
 Persistent right sided axial low back pain, improved with the previous measures. Though still present. X-rays did show L5-S1 DDD. We will start a low-dose Lyrica up titration, I would like to go and get the lumbar spine MRI for procedural planning. Adding some formal PT. Return to see me in 6 more weeks.

## 2023-07-19 NOTE — Telephone Encounter (Signed)
 Chief Complaint: SOB Symptoms: SOB, itching Frequency: today Pertinent Negatives: Patient denies CP Disposition: [x] ED /[] Urgent Care (no appt availability in office) / [] Appointment(In office/virtual)/ []  [facility]/ [] Home Care/ [] Refused Recommended Disposition /[] [facility]/ []  Follow-up with PCP Additional Notes: Pt reports sudden and severe itching to both arms. Pt also reports SOB. Pt is clearly severely SOB on the phone. Pt is struggling for each breath and speaking in single words. RN advised the pt RN is concerned about an allergic reaction. Pt was seen in the office today. Pt denies taking new medications. States she picked the medications up, but did not take them yet. RN advised the pt RN would need to call 911. Pt declined ambulance transport and states her husband is with her and she prefers that he take her. RN advised pt if she worsens, they must call 911. Pt verbalized understanding.       Copied from CRM [id number]. Topic: Clinical - Red Word Triage >> Jul 19, 2023  2:09 PM Blair Bumpers wrote: Red Word that prompted transfer to Nurse Triage: Patient states she is itching on both arms like crazy & states it will not stop. Also she's having trouble breathing. Wanting to be seen today, if possible. Reason for Disposition  SEVERE difficulty breathing (e.g., struggling for each breath, speaks in single words)  Answer Assessment - Initial Assessment Questions 1. RESPIRATORY STATUS: "Describe your breathing?" (e.g., wheezing, shortness of breath, unable to speak, severe coughing)      Very SOB at rest  2. ONSET: "When did this breathing problem begin?"      Today  3. PATTERN "Does the difficult breathing come and go, or has it been constant since it started?"      Sounds constant  4. SEVERITY: "How bad is your breathing?" (e.g., mild, moderate, severe)    - MILD: No SOB at rest, mild SOB with walking, speaks normally in sentences, can lie down, no  retractions, pulse < 100.    - MODERATE: SOB at rest, SOB with minimal exertion and prefers to sit, cannot lie down flat, speaks in phrases, mild retractions, audible wheezing, pulse 100-120.    - SEVERE: Very SOB at rest, speaks in single words, struggling to breathe, sitting hunched forward, retractions, pulse > 120      Pt appears severely SOB, gasping for air  5. RECURRENT SYMPTOM: "Have you had difficulty breathing before?" If Yes, ask: "When was the last time?" and "What happened that time?"      No  6. CARDIAC HISTORY: "Do you have any history of heart disease?" (e.g., heart attack, angina, bypass surgery, angioplasty)      HTN, cerebral aneurysm, subclavian artery occlusion, aortic ectasia  7. LUNG HISTORY: "Do you have any history of lung disease?"  (e.g., pulmonary embolus, asthma, emphysema)     COPD and OSA 8. CAUSE: "What do you think is causing the breathing problem?"      Not sure  9. OTHER SYMPTOMS: "Do you have any other symptoms? (e.g., dizziness, runny nose, cough, chest pain, fever)     Severe itching to both arms  10. O2 SATURATION MONITOR:  "Do you use an oxygen saturation monitor (pulse oximeter) at home?" If Yes, ask: "What is your reading (oxygen level) today?" "What is your usual oxygen saturation reading?" (e.g., 95%)       N/a  Protocols used: Breathing Difficulty-A-AH

## 2023-07-19 NOTE — Assessment & Plan Note (Signed)
 Years of pain left groin, not too bad with internal rotation, she does have some reproduction of pain with resisted flexion of the hip. Adding PT, x-rays, return to see me in 6 weeks. MRI if no better.

## 2023-07-24 ENCOUNTER — Encounter: Payer: Self-pay | Admitting: Cardiology

## 2023-07-24 DIAGNOSIS — R072 Precordial pain: Secondary | ICD-10-CM

## 2023-07-24 MED ORDER — ATORVASTATIN CALCIUM 80 MG PO TABS: 80.0000 mg | ORAL_TABLET | Freq: Every day | ORAL | 3 refills | Status: AC

## 2023-07-25 DIAGNOSIS — I671 Cerebral aneurysm, nonruptured: Secondary | ICD-10-CM | POA: Diagnosis not present

## 2023-07-25 DIAGNOSIS — I6502 Occlusion and stenosis of left vertebral artery: Secondary | ICD-10-CM | POA: Diagnosis not present

## 2023-07-26 DIAGNOSIS — L905 Scar conditions and fibrosis of skin: Secondary | ICD-10-CM | POA: Diagnosis not present

## 2023-07-29 NOTE — Therapy (Signed)
 OUTPATIENT PHYSICAL THERAPY THORACOLUMBAR EVALUATION   Patient Name: Tina Navarro MRN: [id number] DOB:Apr 16, 1960, 63 y.o., female Today's Date: 07/30/2023  END OF SESSION:  PT End of Session - 07/30/23 1317     Visit Number 1    Date for PT Re-Evaluation 09/24/23    Authorization Type [employer] federal    PT Start Time 1317    PT Stop Time 1400    PT Time Calculation (min) 43 min    Activity Tolerance Patient tolerated treatment well    Behavior During Therapy [facility] for tasks assessed/performed             Past Medical History:  Diagnosis Date   Anxiety    Basilar artery aneurysm (HCC)    s/p stent supported coil embolization of basilar aneurysm 12/02/20   COVID-19 10/16/2022   Dyspnea    Emphysematous COPD (HCC) 04/23/2017   GERD (gastroesophageal reflux disease)    Hyperlipidemia    Hypertension    Peripheral arterial disease (HCC)    Thyroid  nodule 04/23/2017   Past Surgical History:  Procedure Laterality Date   ABDOMINAL AORTOGRAM N/A 07/16/2017   Procedure: ABDOMINAL AORTOGRAM;  Surgeon: Margherita Shell, MD;  Location: [facility];  Service: Cardiovascular;  Laterality: N/A;   ABDOMINAL AORTOGRAM W/LOWER EXTREMITY Bilateral 12/02/2018   Procedure: ABDOMINAL AORTOGRAM W/LOWER EXTREMITY;  Surgeon: Margherita Shell, MD;  Location: [facility];  Service: Cardiovascular;  Laterality: Bilateral;   AORTIC ARCH ANGIOGRAPHY N/A 12/02/2018   Procedure: AORTIC ARCH ANGIOGRAPHY;  Surgeon: Margherita Shell, MD;  Location: [facility];  Service: Cardiovascular;  Laterality: N/A;   basilaraneurysm repair  11/2020   CAROTID-SUBCLAVIAN BYPASS GRAFT Left 02/22/2021   Procedure: LEFT CAROTID-SUBCLAVIAN BYPASS USING HEAMSHIELD GOLD X30MM;  Surgeon: Margherita Shell, MD;  Location: [facility] OR;  Service: Vascular;  Laterality: Left;   ENDOMETRIAL BIOPSY  2005   FEMORAL ARTERY STENT Bilateral 2008   IR 3D INDEPENDENT WKST  12/02/2020   IR ANGIO INTRA EXTRACRAN SEL  INTERNAL CAROTID UNI R MOD SED  11/08/2020   IR ANGIO VERTEBRAL SEL SUBCLAVIAN INNOMINATE UNI R MOD SED  11/08/2020   IR ANGIO VERTEBRAL SEL VERTEBRAL BILAT MOD SED  12/02/2020   IR ANGIO VERTEBRAL SEL VERTEBRAL UNI R MOD SED  06/16/2021   IR ANGIOGRAM FOLLOW UP STUDY  12/02/2020   IR ANGIOGRAM FOLLOW UP STUDY  12/02/2020   IR ANGIOGRAM FOLLOW UP STUDY  12/02/2020   IR ANGIOGRAM FOLLOW UP STUDY  12/02/2020   IR TRANSCATH/EMBOLIZ  12/02/2020   IR US  GUIDE VASC ACCESS RIGHT  11/08/2020   IR US  GUIDE VASC ACCESS RIGHT  12/02/2020   IR US  GUIDE VASC ACCESS RIGHT  06/16/2021   IR US  GUIDE VASC ACCESS RIGHT  06/16/2021   LOWER EXTREMITY ANGIOGRAPHY Bilateral 07/16/2017   Procedure: Lower Extremity Angiography;  Surgeon: Margherita Shell, MD;  Location: [facility];  Service: Cardiovascular;  Laterality: Bilateral;   NASAL SINUS SURGERY     PERIPHERAL VASCULAR BALLOON ANGIOPLASTY Bilateral 07/16/2017   Procedure: PERIPHERAL VASCULAR BALLOON ANGIOPLASTY;  Surgeon: Margherita Shell, MD;  Location: [facility];  Service: Cardiovascular;  Laterality: Bilateral;  iliacs   PERIPHERAL VASCULAR INTERVENTION Bilateral 12/02/2018   Procedure: PERIPHERAL VASCULAR INTERVENTION;  Surgeon: Margherita Shell, MD;  Location: [facility];  Service: Cardiovascular;  Laterality: Bilateral;   RADIOLOGY WITH ANESTHESIA N/A 12/02/2020   Procedure: Arteriogram, Surpass embolization of basilar aneurysm;  Surgeon: Nat Badger,  Jacinta Martinis, MD;  Location: [facility] OR;  Service: Radiology;  Laterality: N/A;   TONSILLECTOMY     TUBAL LIGATION     VASCULAR SURGERY     Patient Active Problem List   Diagnosis Date Noted   Left groin pain 07/19/2023   Lumbar degenerative disc disease 06/28/2023   Age-related osteoporosis without current pathological fracture 12/19/2022   OSA (obstructive sleep apnea) 08/29/2022   IFG (impaired fasting glucose) 08/29/2022   History of basal cell cancer, nose 10/12/2021   Thoracic  aortic ectasia (HCC) 08/17/2021   Left subclavian artery occlusion 02/22/2021   Aneurysm, cerebral, nonruptured 12/02/2020   Anxiety state 12/25/2017   Thyroid  nodule 04/23/2017   Emphysematous COPD (HCC) 04/23/2017   Neuropathy involving both lower extremities 04/22/2017   Pleuritic chest pain 04/22/2017   Essential hypertension 05/01/2016   Abnormal findings on esophagogastroduodenoscopy (EGD) 03/18/2016   BPPV (benign paroxysmal positional vertigo) 06/28/2015   Irritable bowel syndrome 04/01/2015   Dyslipidemia, goal LDL below 70 04/01/2015   Multinodular goiter 12/19/2011   Constipation 07/28/2011   Dyslipidemia 07/28/2011   Acid reflux 09/25/2006   Peripheral vascular disease (HCC) 09/25/2006    PCP: Cydney Draft, MD   REFERRING PROVIDER: Gean Keels MD   REFERRING DIAG: M51.360 (ICD-10-CM) - Degeneration of intervertebral disc of lumbar region with discogenic back pain - Hip and back  Rationale for Evaluation and Treatment: Rehabilitation  THERAPY DIAG:  Pain in thoracic spine  Other low back pain  Muscle weakness (generalized)  ONSET DATE: 2024 after caring for her mom  SUBJECTIVE:                                                                                                                                                                                           SUBJECTIVE STATEMENT: Patient reporting pain from bra line down and then laterally to R. Also having L hip pain in the front to where she can't hardly walk. Intermittent tingling down B legs.  PERTINENT HISTORY:  Osteoporosis , Brain aneurysm w/ surgery 10/22, CAD  PAIN:  Are you having pain? Yes: NPRS scale: 4/10 now up to 10/10 Pain location: bra line to low back and off to R>L Pain description: throbbing, pulling Aggravating factors: twisting to the R, lifting, lying on left side Relieving factors: sitting straight  PRECAUTIONS: Other: OP  RED FLAGS: None   WEIGHT  BEARING RESTRICTIONS: No  FALLS:  Has patient fallen in last 6 months? 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  DIAGNOSTIC FINDINGS:  Having MRI 07/30/23   Lumbar XR IMPRESSION: Mild degenerative joint changes at L5-S1.  PATIENT SURVEYS:  Modified Oswestry 30 / 50 = 60.0 %   COGNITION: Overall cognitive status: Within functional limits for tasks assessed     SENSATION: Not tested  MUSCLE LENGTH: B HS tightness R>L and R psoas tightness  POSTURE: rounded shoulders, forward head, decreased lumbar lordosis, increased thoracic kyphosis, flexed trunk   PALPATION: Tightness in B thoracic paraspinals  LUMBAR ROM: WFL - Left SB pulling on R, left rotation caused cramping L bra line   LOWER EXTREMITY ROM:     LOWER EXTREMITY MMT:  core weakness with hip flexor testing  MMT Right eval Left eval  Hip flexion 4 5  Hip extension 5 5  Hip abduction 5 5  Hip adduction 5 5  Hip internal rotation    Hip external rotation    Knee flexion    Knee extension    Ankle dorsiflexion    Ankle plantarflexion    Ankle inversion    Ankle eversion     (Blank rows = not tested)  FUNCTIONAL TESTS:  5 times sit to stand: 17.74   TREATMENT DATE:                                                                                                                               07/30/23 See pt ed and HEP      PATIENT EDUCATION:  Education details: PT eval findings, anticipated POC, initial HEP, postural awareness,  posture and body mechanics for typical daily postioning, mobility and household tasks, and Osteoporosis education  Person educated: Patient Education method: Explanation, Demonstration, and Handouts Education comprehension: verbalized understanding and returned demonstration  HOME EXERCISE PROGRAM:  Access Code: [email or web address] URL: [email or web address] Date: 07/30/2023 Prepared by: Concha Deed  Program Notes OSTEOPOROSIS  Significant research has been done over the last few years that have made great strides in osteoporosis recommendations.  A few key concepts about bone loading:1) be specific and target those areas of lowest T scores like the femoral neck or lumbar spine2) be progressive and patient. It takes 6-8 months to make a change in bones3)  the areas that have the worst T score benefit the most and the biggest improvements are seen!4)  Even if you stop exercise, there will be some decline although some benefits will maintain POWER LIFTING:One study called the LIFTMOR study really expands on the benefit of  lifting heavier loads:  dead lifting, carries  PROGRESSIVE RESISTANCE TRAINING: can be your own body weight against gravity,  elastic bands or weights for resistance.  A basic muscle strengthening program can include:  Squat, lunge, hinge or bridge exercises to improve leg strengthPush, pull and press exercises for upper body and shoulder muscles such as pull downs, rows and counter or floor push upsPlanks, side planks and bird dog exercises to target abdominal and back extensor muscles and improve posture  IMPACT EXERCISE:  a study in 2019 measured ground reactive forces relative to bodyweight to find which exercises stimulated the bone the most.  Some of the most surprisingly high beneficial forces ZOX:WRUE dropsHoppingFoot StompingSide to side jump over rope  BALANCE:  balance exercises involve staying steady during movements that make you unstable. You should practice:Leaning forward,  backward or side to sideUnusual walking or dance patterns, such as walking heel to toe or sideways or using an agility ladderReacting to things that upset your balance, like stopping or changing directionsTai Chi Practice exercises that improve functional abilities such as:Sit to stands or squatsStair climbing or toe taps on a step  Aim for 2-3 times per week and increase difficulty over time.   Patient Education - Do it right & prevent fractures - Osteoporosis education OP Do's and Don'ts    ASSESSMENT:  CLINICAL IMPRESSION: Patient is a 63 y.o. female who was seen today for physical therapy evaluation and treatment for mid and low back and left hip pain. She has a history of Osteoporosis and was seen in April of this year in this clinic, but discharged due to her high pain levels and lack of tolerance to PT. She has pain and spasm with lumbar rotation and tightness in B thoracic paraspinals. She demonstrates rounded shoulders and increased thoracic kyphosis as well as decreased knowledge about body mechanics especially as it relates to osteoporosis. Her modified oswestry score is 30 / 50 = 60.0 %. She was able to complete the full evaluation with no significant change in her pain levels and should be able to tolerate PT this episode. She is getting an MRI this afternoon .She will benefit from skilled PT to address these deficits.    OBJECTIVE IMPAIRMENTS: decreased activity tolerance, decreased knowledge of condition, difficulty walking, decreased ROM, decreased strength, increased muscle spasms, impaired flexibility, improper body mechanics, postural dysfunction, and pain.   ACTIVITY LIMITATIONS: carrying, lifting, bending, standing, sleeping, dressing, locomotion level, and caring for others  PARTICIPATION LIMITATIONS: meal prep, cleaning, laundry, shopping, and community activity  PERSONAL FACTORS: Age, Past/current experiences, Time since onset of injury/illness/exacerbation, and 1-2  comorbidities: osteoporosis, CAD are also affecting patient's functional outcome.   REHAB POTENTIAL: Good  CLINICAL DECISION MAKING: Evolving/moderate complexity  EVALUATION COMPLEXITY: Moderate   GOALS: Goals reviewed with patient? Yes  SHORT TERM GOALS: Target date: 08/27/2023   Patient will be independent with initial HEP.  Baseline:  Goal status: INITIAL  2.  Patient will report decreased pain by 30% with walking.  Baseline:  Goal status: INITIAL   LONG TERM GOALS: Target date: 09/24/2023   Patient will be independent with advanced/ongoing HEP to improve outcomes and carryover; specifically weightbearing exercises to address her OP.  Baseline:  Goal status: INITIAL  2.  Patient will report 75%% improvement in low back and L hip pain with ADLs to improve QOL.  Baseline:  Goal status: INITIAL  3.  Patient will demonstrate correct body mechanics for lifting and squatting to avoid further injury and allow her to care for her grandson.   Baseline:  Goal status: INITIAL  4.  Patient will demonstrate improved  5XSTS by 2-3 seconds demonstrating improved LE functional strength. Baseline: 17.74 sec Goal status: INITIAL  5.  Patient will score 24 on the Modified Oswestry demonstrating improved functional ability.  Baseline: 30 Goal status: INITIAL   PLAN:  PT FREQUENCY: 2x/week  PT DURATION: 8 weeks  PLANNED INTERVENTIONS: 97164- PT Re-evaluation, 97110-Therapeutic exercises, 97530- Therapeutic activity, 97112- Neuromuscular re-education, 97535- Self Care, [id number]- Manual therapy, [phone]- Aquatic Therapy, [phone]- Electrical stimulation (unattended), [phone] (1-2 muscles), 20561 (3+ muscles)- Dry Needling, Patient/Family education, Joint mobilization, Spinal mobilization, Cryotherapy, and Moist heat.  PLAN FOR NEXT SESSION: Establish HEP, release left psoas, focus on resistance training and weightbearing exercises for OP, functional LE strength, postural strength, body mechanics  and ADL modifications    Jinx Mourning, PT  07/30/2023, 4:39 PM

## 2023-07-30 ENCOUNTER — Encounter: Payer: Self-pay | Admitting: Physical Therapy

## 2023-07-30 ENCOUNTER — Other Ambulatory Visit: Payer: Self-pay

## 2023-07-30 ENCOUNTER — Ambulatory Visit

## 2023-07-30 ENCOUNTER — Ambulatory Visit: Attending: Sports Medicine | Admitting: Physical Therapy

## 2023-07-30 ENCOUNTER — Ambulatory Visit: Payer: Self-pay | Admitting: Sports Medicine

## 2023-07-30 DIAGNOSIS — R29898 Other symptoms and signs involving the musculoskeletal system: Secondary | ICD-10-CM | POA: Diagnosis present

## 2023-07-30 DIAGNOSIS — M546 Pain in thoracic spine: Secondary | ICD-10-CM | POA: Diagnosis not present

## 2023-07-30 DIAGNOSIS — M5136 Other intervertebral disc degeneration, lumbar region with discogenic back pain only: Secondary | ICD-10-CM

## 2023-07-30 DIAGNOSIS — M549 Dorsalgia, unspecified: Secondary | ICD-10-CM | POA: Diagnosis present

## 2023-07-30 DIAGNOSIS — M6281 Muscle weakness (generalized): Secondary | ICD-10-CM

## 2023-07-30 DIAGNOSIS — M5459 Other low back pain: Secondary | ICD-10-CM

## 2023-07-30 DIAGNOSIS — M4317 Spondylolisthesis, lumbosacral region: Secondary | ICD-10-CM | POA: Diagnosis not present

## 2023-07-30 DIAGNOSIS — M545 Low back pain, unspecified: Secondary | ICD-10-CM | POA: Diagnosis not present

## 2023-07-30 DIAGNOSIS — M47816 Spondylosis without myelopathy or radiculopathy, lumbar region: Secondary | ICD-10-CM | POA: Diagnosis not present

## 2023-07-30 NOTE — Patient Instructions (Signed)
DO's and DON'T's   Avoid and/or Minimize positions of forward bending ( flexion)  Side bending and rotation of the trunk  Especially when movements occur together   When your back aches:   Don't sit down   Lie down on your back with a small pillow under your head and one under your knees or as outlined by our therapist. Or, lie in the 90/90 position ( on the floor with your feet and legs on the sofa with knees and hips bent to 90 degrees)  Tying or putting on your shoes:   Don't bend over to tie your shoes or put on socks.  Instead, bring one foot up, cross it over the opposite knee and bend forward (hinge) at the hips to so the task.  Keep your back straight.  If you cannot do this safely, then you need to use long handled assistive devices such as a shoehorn and sock puller.  Exercising:  Don't engage in ballistic types of exercise routines such as high-impact aerobics or jumping rope  Don't do exercises in the gym that bring you forward (abdominal crunches, sit-ups, touching your  toes, knee-to-chest, straight leg raising.)  Follow a regular exercise program that includes a variety of different weight-bearing activities, such as low-impact aerobics, T' ai chi or walking as your physical therapist advises  Do exercises that emphasize return to normal body alignment and strengthening of the muscles that keep your back straight, as outlined in this program or by your therapist  Household tasks:  Don't reach unnecessarily or twist your trunk when mopping, sweeping, vacuuming, raking, making beds, weeding gardens, getting objects ou of cupboards, etc.  Keep your broom, mop, vacuum, or rake close to you and mover your whole body as you move them. Walk over to the area on which you are working. Arrange kitchen, bathroom, and bedroom shelves so that frequently used items may be reached without excessive bending, twisting, and reaching.  Use a  sturdy stool if necessary.  Don't bend from the waist to pick up something up  Off the floor, out of the trunk of your car, or to brush your teeth, wash your face, etc.   Bend at the knees, keeping back straight as possible. Use a reacher if necessary.   Prevention of fracture is the so-called "BOTTOm -Line" in the management of OSTEOPOROSIS. Do not take unnecessary chances in movement. Once a compression fracture occurs, the process is very difficult to control; one fracture is frequently followed by many more.   

## 2023-08-01 ENCOUNTER — Ambulatory Visit

## 2023-08-01 DIAGNOSIS — M5459 Other low back pain: Secondary | ICD-10-CM | POA: Diagnosis not present

## 2023-08-01 DIAGNOSIS — M549 Dorsalgia, unspecified: Secondary | ICD-10-CM

## 2023-08-01 DIAGNOSIS — R29898 Other symptoms and signs involving the musculoskeletal system: Secondary | ICD-10-CM

## 2023-08-01 DIAGNOSIS — M6281 Muscle weakness (generalized): Secondary | ICD-10-CM | POA: Diagnosis not present

## 2023-08-01 DIAGNOSIS — M545 Low back pain, unspecified: Secondary | ICD-10-CM | POA: Diagnosis not present

## 2023-08-01 DIAGNOSIS — M546 Pain in thoracic spine: Secondary | ICD-10-CM

## 2023-08-01 DIAGNOSIS — M5136 Other intervertebral disc degeneration, lumbar region with discogenic back pain only: Secondary | ICD-10-CM | POA: Diagnosis not present

## 2023-08-01 NOTE — Therapy (Signed)
 OUTPATIENT PHYSICAL THERAPY THORACOLUMBAR TREATMENT   Patient Name: Kadisha Goodine MRN: [id number] DOB:05-Jun-1960, 63 y.o., female Today's Date: 08/01/2023  END OF SESSION:  PT End of Session - 08/01/23 1700     Visit Number 2    Number of Visits 16    Date for PT Re-Evaluation 09/24/23    Authorization Type [employer] federal    Progress Note Due on Visit 10    PT Start Time 1318    PT Stop Time 1358    PT Time Calculation (min) 40 min    Activity Tolerance Patient tolerated treatment well              Past Medical History:  Diagnosis Date   Anxiety    Basilar artery aneurysm (HCC)    s/p stent supported coil embolization of basilar aneurysm 12/02/20   COVID-19 10/16/2022   Dyspnea    Emphysematous COPD (HCC) 04/23/2017   GERD (gastroesophageal reflux disease)    Hyperlipidemia    Hypertension    Peripheral arterial disease (HCC)    Thyroid  nodule 04/23/2017   Past Surgical History:  Procedure Laterality Date   ABDOMINAL AORTOGRAM N/A 07/16/2017   Procedure: ABDOMINAL AORTOGRAM;  Surgeon: Margherita Shell, MD;  Location: [facility];  Service: Cardiovascular;  Laterality: N/A;   ABDOMINAL AORTOGRAM W/LOWER EXTREMITY Bilateral 12/02/2018   Procedure: ABDOMINAL AORTOGRAM W/LOWER EXTREMITY;  Surgeon: Margherita Shell, MD;  Location: [facility];  Service: Cardiovascular;  Laterality: Bilateral;   AORTIC ARCH ANGIOGRAPHY N/A 12/02/2018   Procedure: AORTIC ARCH ANGIOGRAPHY;  Surgeon: Margherita Shell, MD;  Location: [facility];  Service: Cardiovascular;  Laterality: N/A;   basilaraneurysm repair  11/2020   CAROTID-SUBCLAVIAN BYPASS GRAFT Left 02/22/2021   Procedure: LEFT CAROTID-SUBCLAVIAN BYPASS USING HEAMSHIELD GOLD X30MM;  Surgeon: Margherita Shell, MD;  Location: [facility] OR;  Service: Vascular;  Laterality: Left;   ENDOMETRIAL BIOPSY  2005   FEMORAL ARTERY STENT Bilateral 2008   IR 3D INDEPENDENT WKST  12/02/2020   IR ANGIO INTRA EXTRACRAN SEL  INTERNAL CAROTID UNI R MOD SED  11/08/2020   IR ANGIO VERTEBRAL SEL SUBCLAVIAN INNOMINATE UNI R MOD SED  11/08/2020   IR ANGIO VERTEBRAL SEL VERTEBRAL BILAT MOD SED  12/02/2020   IR ANGIO VERTEBRAL SEL VERTEBRAL UNI R MOD SED  06/16/2021   IR ANGIOGRAM FOLLOW UP STUDY  12/02/2020   IR ANGIOGRAM FOLLOW UP STUDY  12/02/2020   IR ANGIOGRAM FOLLOW UP STUDY  12/02/2020   IR ANGIOGRAM FOLLOW UP STUDY  12/02/2020   IR TRANSCATH/EMBOLIZ  12/02/2020   IR US  GUIDE VASC ACCESS RIGHT  11/08/2020   IR US  GUIDE VASC ACCESS RIGHT  12/02/2020   IR US  GUIDE VASC ACCESS RIGHT  06/16/2021   IR US  GUIDE VASC ACCESS RIGHT  06/16/2021   LOWER EXTREMITY ANGIOGRAPHY Bilateral 07/16/2017   Procedure: Lower Extremity Angiography;  Surgeon: Margherita Shell, MD;  Location: [facility];  Service: Cardiovascular;  Laterality: Bilateral;   NASAL SINUS SURGERY     PERIPHERAL VASCULAR BALLOON ANGIOPLASTY Bilateral 07/16/2017   Procedure: PERIPHERAL VASCULAR BALLOON ANGIOPLASTY;  Surgeon: Margherita Shell, MD;  Location: [facility];  Service: Cardiovascular;  Laterality: Bilateral;  iliacs   PERIPHERAL VASCULAR INTERVENTION Bilateral 12/02/2018   Procedure: PERIPHERAL VASCULAR INTERVENTION;  Surgeon: Margherita Shell, MD;  Location: [facility];  Service: Cardiovascular;  Laterality: Bilateral;   RADIOLOGY WITH ANESTHESIA N/A 12/02/2020   Procedure: Arteriogram, Lesly Raspberry  embolization of basilar aneurysm;  Surgeon: Augusto Blonder, MD;  Location: [facility] OR;  Service: Radiology;  Laterality: N/A;   TONSILLECTOMY     TUBAL LIGATION     VASCULAR SURGERY     Patient Active Problem List   Diagnosis Date Noted   Left groin pain 07/19/2023   Lumbar degenerative disc disease 06/28/2023   Age-related osteoporosis without current pathological fracture 12/19/2022   OSA (obstructive sleep apnea) 08/29/2022   IFG (impaired fasting glucose) 08/29/2022   History of basal cell cancer, nose 10/12/2021   Thoracic  aortic ectasia (HCC) 08/17/2021   Left subclavian artery occlusion 02/22/2021   Aneurysm, cerebral, nonruptured 12/02/2020   Anxiety state 12/25/2017   Thyroid  nodule 04/23/2017   Emphysematous COPD (HCC) 04/23/2017   Neuropathy involving both lower extremities 04/22/2017   Pleuritic chest pain 04/22/2017   Essential hypertension 05/01/2016   Abnormal findings on esophagogastroduodenoscopy (EGD) 03/18/2016   BPPV (benign paroxysmal positional vertigo) 06/28/2015   Irritable bowel syndrome 04/01/2015   Dyslipidemia, goal LDL below 70 04/01/2015   Multinodular goiter 12/19/2011   Constipation 07/28/2011   Dyslipidemia 07/28/2011   Acid reflux 09/25/2006   Peripheral vascular disease (HCC) 09/25/2006    PCP: Cydney Draft, MD   REFERRING PROVIDER: Gean Keels MD   REFERRING DIAG: M51.360 (ICD-10-CM) - Degeneration of intervertebral disc of lumbar region with discogenic back pain - Hip and back  Rationale for Evaluation and Treatment: Rehabilitation  THERAPY DIAG:  Pain in thoracic spine  Other low back pain  Muscle weakness (generalized)  Mid back pain  Other symptoms and signs involving the musculoskeletal system  Acute bilateral low back pain without sciatica  ONSET DATE: 2024 after caring for her mom  SUBJECTIVE:                                                                                                                                                                                           SUBJECTIVE STATEMENT: Patient returned to the clinic stating that her symptoms are unchanged. She reviewed that her pain is across her mid back down to her waist on both sides, R > L, and she gets cramping sensations in the muscles in this region. When this pain is elevated, she stated she has her husband push around on her back but they cannot find an area that is sore or reproduces symptoms. She will also get pain at the base of her low back which will  radiate pain down both of her legs - this feels like a separate pain from her upper back pain. Last, she will get a pain in the front of her  L hip right in the crease of the hip when she walks for longer periods. Otherwise, her back pain does not seem to have any specific aggravators besides general movement.  Evaluation: Patient reporting pain from bra line down and then laterally to R. Also having L hip pain in the front to where she can't hardly walk. Intermittent tingling down B legs.   PERTINENT HISTORY:  Osteoporosis , Brain aneurysm w/ surgery 10/22, CAD  PAIN:  Are you having pain? Yes: NPRS scale: 4/10 now up to 10/10 Pain location: bra line to low back and off to R>L Pain description: throbbing, pulling Aggravating factors: twisting to the R, lifting, lying on left side Relieving factors: sitting straight  PRECAUTIONS: Other: OP  RED FLAGS: None   WEIGHT BEARING RESTRICTIONS: No  FALLS:  Has patient fallen in last 6 months? 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  DIAGNOSTIC FINDINGS:  Having MRI 07/30/23   Lumbar XR IMPRESSION: Mild degenerative joint changes at L5-S1.  PATIENT SURVEYS:  Modified Oswestry 30 / 50 = 60.0 %   COGNITION: Overall cognitive status: Within functional limits for tasks assessed     SENSATION: Not tested  MUSCLE LENGTH: B HS tightness R>L and R psoas tightness  POSTURE: rounded shoulders, forward head, decreased lumbar lordosis, increased thoracic kyphosis,  flexed trunk   PALPATION: Tightness in B thoracic paraspinals  LUMBAR ROM: WFL - Left SB pulling on R, left rotation caused cramping L bra line   LOWER EXTREMITY ROM:     LOWER EXTREMITY MMT:  core weakness with hip flexor testing  MMT Right eval Left eval  Hip flexion 4 5  Hip extension 5 5  Hip abduction 5 5  Hip adduction 5 5  Hip internal rotation    Hip external rotation    Knee flexion    Knee extension    Ankle dorsiflexion    Ankle plantarflexion    Ankle inversion    Ankle eversion     (Blank rows = not tested)  FUNCTIONAL TESTS:  5 times sit to stand: 17.74  [facility] Adult PT Treatment:                                                DATE: 08/01/2023 Manual Therapy: Prone: Soft tissue mobilization to thoracic/lumbar paraspinals, latissimus, and nearby tissues Gentle cephalad glides through the rib cage +/- caudal glides at the pelvis R and L General distraction glides throughout the thoracic and lumbar spine  TREATMENT DATE:  07/30/23 See pt ed and HEP   PATIENT EDUCATION:  Education details: PT eval findings, anticipated POC, initial HEP, postural awareness, posture and body mechanics for typical daily postioning, mobility and household tasks, and Osteoporosis education  Person educated: Patient Education method: Explanation, Demonstration, and Handouts Education comprehension: verbalized understanding and returned demonstration  HOME EXERCISE PROGRAM:  Access Code: [email or web address] URL: [email or web address] Date: 07/30/2023 Prepared by: Concha Deed  Program Notes OSTEOPOROSIS  Significant research has been done over the last few years that have made great strides in osteoporosis recommendations.  A few key concepts about bone loading:1) be specific and target those areas of lowest T scores like the femoral neck or lumbar spine2) be  progressive and patient. It takes 6-8 months to make a change in bones3)  the areas that have the worst T score benefit the most and the biggest improvements are seen!4)  Even if you stop exercise, there will be some decline although some benefits will maintain POWER LIFTING:One study called the LIFTMOR study really expands on the benefit of  lifting heavier loads:  dead lifting, carries  PROGRESSIVE RESISTANCE TRAINING: can be your own body weight against gravity, elastic bands or weights for resistance.  A basic muscle strengthening program can include:  Squat, lunge, hinge or bridge exercises to improve leg strengthPush, pull and press exercises for upper body and shoulder muscles such as pull downs, rows and counter or floor push upsPlanks, side planks and bird dog exercises to target abdominal and back extensor muscles and improve posture  IMPACT EXERCISE:  a study in 2019 measured ground reactive forces relative to bodyweight to find which exercises stimulated the bone the most.  Some of the most surprisingly high beneficial forces EXB:MWUX dropsHoppingFoot StompingSide to side jump over rope  BALANCE:  balance exercises involve staying steady during movements that make you unstable. You should practice:Leaning forward, backward or side to sideUnusual walking or dance patterns, such as walking heel to toe or sideways or using an agility ladderReacting to things that upset your balance, like stopping or changing directionsTai Chi Practice exercises that improve functional abilities such as:Sit to stands or squatsStair climbing or toe taps on a step  Aim for 2-3 times per week and increase difficulty over time.   Patient Education - Do it right & prevent fractures - Osteoporosis education OP Do's and Don'ts    ASSESSMENT:  CLINICAL IMPRESSION: Patient's symptoms are generalized and difficult to reproduce, so trialed generalized treatments throughout the regions of chief complaints for thoracic and  lumbar pains to see if any benefit could be achieved. Patient did experience cramping symptoms when navigating off of the treatment table, so short-term, limited effect noted. Patient will monitor symptoms between sessions to see if there was any therapeutic benefit from today's treatment. Physical therapy remains indicated.  Evaluation: Patient is a 63 y.o. female who was seen today for physical therapy evaluation and treatment for mid and low back and left hip pain. She has a history of Osteoporosis and was seen in April of this year in this clinic, but discharged due to her high pain levels and lack of tolerance to PT. She has pain and spasm with lumbar rotation and tightness in B thoracic paraspinals. She demonstrates rounded shoulders and increased thoracic kyphosis as well as decreased knowledge about body mechanics especially as it relates to osteoporosis. Her modified oswestry score is 30 / 50 = 60.0 %. She was able to complete the full evaluation with no significant change in her pain  levels and should be able to tolerate PT this episode. She is getting an MRI this afternoon .She will benefit from skilled PT to address these deficits.    OBJECTIVE IMPAIRMENTS: decreased activity tolerance, decreased knowledge of condition, difficulty walking, decreased ROM, decreased strength, increased muscle spasms, impaired flexibility, improper body mechanics, postural dysfunction, and pain.   ACTIVITY LIMITATIONS: carrying, lifting, bending, standing, sleeping, dressing, locomotion level, and caring for others  PARTICIPATION LIMITATIONS: meal prep, cleaning, laundry, shopping, and community activity  PERSONAL FACTORS: Age, Past/current experiences, Time since onset of injury/illness/exacerbation, and 1-2 comorbidities: osteoporosis, CAD are also affecting patient's functional outcome.   REHAB POTENTIAL: Good  CLINICAL DECISION MAKING: Evolving/moderate complexity  EVALUATION COMPLEXITY:  Moderate   GOALS: Goals reviewed with patient? Yes  SHORT TERM GOALS: Target date: 08/27/2023   Patient will be independent with initial HEP.  Baseline:  Goal status: INITIAL  2.  Patient will report decreased pain by 30% with walking.  Baseline:  Goal status: INITIAL   LONG TERM GOALS: Target date: 09/24/2023   Patient will be independent with advanced/ongoing HEP to improve outcomes and carryover; specifically weightbearing exercises to address her OP.  Baseline:  Goal status: INITIAL  2.  Patient will report 75%% improvement in low back and L hip pain with ADLs to improve QOL.  Baseline:  Goal status: INITIAL  3.  Patient will demonstrate correct body mechanics for lifting and squatting to avoid further injury and allow her to care for her grandson.   Baseline:  Goal status: INITIAL  4.  Patient will demonstrate improved 5XSTS by 2-3 seconds demonstrating improved LE functional strength. Baseline: 17.74 sec Goal status: INITIAL  5.  Patient will score 24 on the Modified Oswestry demonstrating improved functional ability.  Baseline: 30 Goal status: INITIAL   PLAN:  PT FREQUENCY: 2x/week  PT DURATION: 8 weeks  PLANNED INTERVENTIONS: 97164- PT Re-evaluation, 97110-Therapeutic exercises, 97530- Therapeutic activity, 97112- Neuromuscular re-education, 97535- Self Care, 82956- Manual therapy, [phone]- Aquatic Therapy, [phone]- Electrical stimulation (unattended), [phone] (1-2 muscles), [phone] (3+ muscles)- Dry Needling, Patient/Family education, Joint mobilization, Spinal mobilization, Cryotherapy, and Moist heat.  PLAN FOR NEXT SESSION: Establish HEP, release left psoas, focus on resistance training and weightbearing exercises for OP, functional LE strength, postural strength, body mechanics and ADL modifications    Jeannette Mills, PT, PhD, DPT  08/01/2023, 5:03 PM

## 2023-08-06 ENCOUNTER — Encounter: Payer: Self-pay | Admitting: Family Medicine

## 2023-08-06 ENCOUNTER — Ambulatory Visit (INDEPENDENT_AMBULATORY_CARE_PROVIDER_SITE_OTHER): Admitting: Family Medicine

## 2023-08-06 DIAGNOSIS — S0185XA Open bite of other part of head, initial encounter: Secondary | ICD-10-CM

## 2023-08-06 DIAGNOSIS — W540XXA Bitten by dog, initial encounter: Secondary | ICD-10-CM

## 2023-08-06 MED ORDER — AMOXICILLIN-POT CLAVULANATE 875-125 MG PO TABS: 1.0000 | ORAL_TABLET | Freq: Two times a day (BID) | ORAL | 0 refills | Status: DC

## 2023-08-06 NOTE — Progress Notes (Signed)
   Acute Office Visit  Subjective:     Patient ID: Tina Navarro, female    DOB: 03-17-1960, 63 y.o.   MRN: [id number]  No chief complaint on file.   HPI Patient is in today for dog bit that occurred today around 12:40 PM. She says it ws her daughter's dog. The dog has a brain tumor and has been having seizure and it scheduled to be put to sleep on Friday. The dog is fully vaccinated.  Her Tdap is up to date.   ROS      Objective:    LMP  (LMP Unknown)    Physical Exam HENT:     Head:      Comments: Area of bite on the left jaw 1 scratch, 1 abrasion and 2 puncture wounds.  No induration.  Puncture areas are actively bleeding.    No results found for any visits on 08/06/23.      Assessment & Plan:   Problem List Items Addressed This Visit   None Visit Diagnoses       Dog bite of face, initial encounter    -  Primary   Relevant Medications   amoxicillin -clavulanate (AUGMENTIN ) 875-125 MG tablet       Dog bite-she said she is already cleaned the wounds.  Wash gently with antibacterial soap twice a day.  Keep wounds open and do not cover.  Just applied pressure and cold compress to stop bleeding.  Apply triple antibiotic twice a day for the next 3 to 4 days going to go ahead and put her on Augmentin  since the dog bite is on her face and on a little concerned about infection even though infection rate is fairly low with dog bites.  Call if any signs of purulent drainage or odor fever or significant swelling.  She will probably have a lot of bruising over the jawline.  Meds ordered this encounter  Medications   amoxicillin -clavulanate (AUGMENTIN ) 875-125 MG tablet    Sig: Take 1 tablet by mouth 2 (two) times daily.    Dispense:  14 tablet    Refill:  0    No follow-ups on file.  Duaine German, MD

## 2023-08-07 ENCOUNTER — Encounter: Payer: Self-pay | Admitting: Physical Therapy

## 2023-08-07 ENCOUNTER — Ambulatory Visit: Admitting: Physical Therapy

## 2023-08-07 DIAGNOSIS — M6281 Muscle weakness (generalized): Secondary | ICD-10-CM | POA: Diagnosis not present

## 2023-08-07 DIAGNOSIS — M5459 Other low back pain: Secondary | ICD-10-CM | POA: Diagnosis not present

## 2023-08-07 DIAGNOSIS — M5136 Other intervertebral disc degeneration, lumbar region with discogenic back pain only: Secondary | ICD-10-CM | POA: Diagnosis not present

## 2023-08-07 DIAGNOSIS — M549 Dorsalgia, unspecified: Secondary | ICD-10-CM | POA: Diagnosis not present

## 2023-08-07 DIAGNOSIS — R29898 Other symptoms and signs involving the musculoskeletal system: Secondary | ICD-10-CM | POA: Diagnosis not present

## 2023-08-07 DIAGNOSIS — M545 Low back pain, unspecified: Secondary | ICD-10-CM | POA: Diagnosis not present

## 2023-08-07 DIAGNOSIS — M546 Pain in thoracic spine: Secondary | ICD-10-CM

## 2023-08-07 NOTE — Therapy (Signed)
 OUTPATIENT PHYSICAL THERAPY THORACOLUMBAR TREATMENT   Patient Name: Tina Navarro MRN: [id number] DOB:1960-06-19, 63 y.o., female Today's Date: 08/07/2023  END OF SESSION:  PT End of Session - 08/07/23 1442     Visit Number 3    Number of Visits 16    Date for PT Re-Evaluation 09/24/23    Authorization Type [employer] federal    Authorization - Visit Number 2    Progress Note Due on Visit 10    PT Start Time 1400    PT Stop Time 1442    PT Time Calculation (min) 42 min    Activity Tolerance Patient tolerated treatment well    Behavior During Therapy [facility] for tasks assessed/performed               Past Medical History:  Diagnosis Date   Anxiety    Basilar artery aneurysm (HCC)    s/p stent supported coil embolization of basilar aneurysm 12/02/20   COVID-19 10/16/2022   Dyspnea    Emphysematous COPD (HCC) 04/23/2017   GERD (gastroesophageal reflux disease)    Hyperlipidemia    Hypertension    Peripheral arterial disease (HCC)    Thyroid  nodule 04/23/2017   Past Surgical History:  Procedure Laterality Date   ABDOMINAL AORTOGRAM N/A 07/16/2017   Procedure: ABDOMINAL AORTOGRAM;  Surgeon: Margherita Shell, MD;  Location: [facility];  Service: Cardiovascular;  Laterality: N/A;   ABDOMINAL AORTOGRAM W/LOWER EXTREMITY Bilateral 12/02/2018   Procedure: ABDOMINAL AORTOGRAM W/LOWER EXTREMITY;  Surgeon: Margherita Shell, MD;  Location: [facility];  Service: Cardiovascular;  Laterality: Bilateral;   AORTIC ARCH ANGIOGRAPHY N/A 12/02/2018   Procedure: AORTIC ARCH ANGIOGRAPHY;  Surgeon: Margherita Shell, MD;  Location: [facility];  Service: Cardiovascular;  Laterality: N/A;   basilaraneurysm repair  11/2020   CAROTID-SUBCLAVIAN BYPASS GRAFT Left 02/22/2021   Procedure: LEFT CAROTID-SUBCLAVIAN BYPASS USING HEAMSHIELD GOLD X30MM;  Surgeon: Margherita Shell, MD;  Location: [facility] OR;  Service: Vascular;  Laterality: Left;   ENDOMETRIAL BIOPSY  2005   FEMORAL  ARTERY STENT Bilateral 2008   IR 3D INDEPENDENT WKST  12/02/2020   IR ANGIO INTRA EXTRACRAN SEL INTERNAL CAROTID UNI R MOD SED  11/08/2020   IR ANGIO VERTEBRAL SEL SUBCLAVIAN INNOMINATE UNI R MOD SED  11/08/2020   IR ANGIO VERTEBRAL SEL VERTEBRAL BILAT MOD SED  12/02/2020   IR ANGIO VERTEBRAL SEL VERTEBRAL UNI R MOD SED  06/16/2021   IR ANGIOGRAM FOLLOW UP STUDY  12/02/2020   IR ANGIOGRAM FOLLOW UP STUDY  12/02/2020   IR ANGIOGRAM FOLLOW UP STUDY  12/02/2020   IR ANGIOGRAM FOLLOW UP STUDY  12/02/2020   IR TRANSCATH/EMBOLIZ  12/02/2020   IR US  GUIDE VASC ACCESS RIGHT  11/08/2020   IR US  GUIDE VASC ACCESS RIGHT  12/02/2020   IR US  GUIDE VASC ACCESS RIGHT  06/16/2021   IR US  GUIDE VASC ACCESS RIGHT  06/16/2021   LOWER EXTREMITY ANGIOGRAPHY Bilateral 07/16/2017   Procedure: Lower Extremity Angiography;  Surgeon: Margherita Shell, MD;  Location: [facility];  Service: Cardiovascular;  Laterality: Bilateral;   NASAL SINUS SURGERY     PERIPHERAL VASCULAR BALLOON ANGIOPLASTY Bilateral 07/16/2017   Procedure: PERIPHERAL VASCULAR BALLOON ANGIOPLASTY;  Surgeon: Margherita Shell, MD;  Location: [facility];  Service: Cardiovascular;  Laterality: Bilateral;  iliacs   PERIPHERAL VASCULAR INTERVENTION Bilateral 12/02/2018   Procedure: PERIPHERAL VASCULAR INTERVENTION;  Surgeon: Margherita Shell, MD;  Location: [facility];  Service: Cardiovascular;  Laterality: Bilateral;   RADIOLOGY WITH ANESTHESIA N/A 12/02/2020   Procedure: Arteriogram, Surpass embolization of basilar aneurysm;  Surgeon: Augusto Blonder, MD;  Location: [facility] OR;  Service: Radiology;  Laterality: N/A;   TONSILLECTOMY     TUBAL LIGATION     VASCULAR SURGERY     Patient Active Problem List   Diagnosis Date Noted   Left groin pain 07/19/2023   Lumbar degenerative disc disease 06/28/2023   Age-related osteoporosis without current pathological fracture 12/19/2022   OSA (obstructive sleep apnea) 08/29/2022   IFG  (impaired fasting glucose) 08/29/2022   History of basal cell cancer, nose 10/12/2021   Thoracic aortic ectasia (HCC) 08/17/2021   Left subclavian artery occlusion 02/22/2021   Aneurysm, cerebral, nonruptured 12/02/2020   Anxiety state 12/25/2017   Thyroid  nodule 04/23/2017   Emphysematous COPD (HCC) 04/23/2017   Neuropathy involving both lower extremities 04/22/2017   Pleuritic chest pain 04/22/2017   Essential hypertension 05/01/2016   Abnormal findings on esophagogastroduodenoscopy (EGD) 03/18/2016   BPPV (benign paroxysmal positional vertigo) 06/28/2015   Irritable bowel syndrome 04/01/2015   Dyslipidemia, goal LDL below 70 04/01/2015   Multinodular goiter 12/19/2011   Constipation 07/28/2011   Dyslipidemia 07/28/2011   Acid reflux 09/25/2006   Peripheral vascular disease (HCC) 09/25/2006    PCP: Cydney Draft, MD   REFERRING PROVIDER: Gean Keels MD   REFERRING DIAG: M51.360 (ICD-10-CM) - Degeneration of intervertebral disc of lumbar region with discogenic back pain - Hip and back  Rationale for Evaluation and Treatment: Rehabilitation  THERAPY DIAG:  Pain in thoracic spine  Other low back pain  Muscle weakness (generalized)  ONSET DATE: 2024 after caring for her mom  SUBJECTIVE:                                                                                                                                                                                           SUBJECTIVE STATEMENT: Patient states her worst pain is mid back which radiates to the Rt when reaching with Rt UE.  Evaluation: Patient reporting pain from bra line down and then laterally to R. Also having L hip pain in the front to where she can't hardly walk. Intermittent tingling down B legs.   PERTINENT HISTORY:  Osteoporosis , Brain aneurysm w/ surgery 10/22, CAD  PAIN:  Are you having pain? Yes: NPRS scale: 4/10 now up to 10/10 Pain location: bra line to low back and off to  R>L Pain description: throbbing, pulling Aggravating factors: twisting to the R, lifting, lying on left side Relieving factors: sitting straight  PRECAUTIONS: Other: OP  RED FLAGS:  None   WEIGHT BEARING RESTRICTIONS: No  FALLS:  Has patient fallen in last 6 months? 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  DIAGNOSTIC FINDINGS:  Having MRI 07/30/23   Lumbar XR IMPRESSION: Mild degenerative joint changes at L5-S1.  PATIENT SURVEYS:  Modified Oswestry 30 / 50 = 60.0 %   COGNITION: Overall cognitive status: Within functional limits for tasks assessed     SENSATION: Not tested  MUSCLE LENGTH: B HS tightness R>L and R psoas tightness  POSTURE: rounded shoulders, forward head, decreased lumbar lordosis, increased thoracic kyphosis, flexed trunk   PALPATION: Tightness in B thoracic paraspinals  LUMBAR ROM: WFL - Left SB pulling on R, left rotation caused cramping L bra line   LOWER EXTREMITY ROM:     LOWER EXTREMITY MMT:  core weakness with hip flexor testing  MMT Right eval Left eval  Hip flexion 4 5  Hip extension 5 5  Hip abduction 5 5  Hip adduction 5 5  Hip internal rotation    Hip external rotation    Knee flexion    Knee extension    Ankle dorsiflexion    Ankle plantarflexion    Ankle inversion    Ankle eversion     (Blank rows = not tested)  FUNCTIONAL TESTS:  5 times sit to stand: 17.74  [facility] Adult PT Treatment:                                                DATE:  08/07/23 Therapeutic Exercise/Activity: Standing lumbar AROM with some increased Rt side pain with lateral flexion, no pain with flexion/extension Hip flexor stretch standing 2 x 20 sec Hip flexor stretch sitting  2 x 20 sec LTR x 2 min in tolerable range Posterior pelvic tilt 10 x 3 sec hold with tactile cues Posterior tilt with bent knee fall out x 10 with tactile cues Physioball roll out laterally Manual Therapy: STM lumbar and thoracic paraspinals, lats   [facility] Adult PT Treatment:                                                DATE: 08/01/2023 Manual Therapy: Prone: Soft tissue mobilization to thoracic/lumbar paraspinals, latissimus, and nearby tissues Gentle cephalad glides through the rib cage +/- caudal glides at the pelvis R and L General distraction glides throughout the thoracic and lumbar spine  TREATMENT DATE:  07/30/23 See pt ed and HEP   PATIENT EDUCATION:  Education details: PT eval findings, anticipated POC, initial HEP, postural awareness, posture and body mechanics for typical daily postioning, mobility and household tasks, and Osteoporosis education  Person educated: Patient Education method: Explanation, Demonstration, and Handouts Education comprehension: verbalized understanding and returned demonstration  HOME EXERCISE PROGRAM:  Access Code: [email or web address] URL: [email or web address] Date: 08/07/2023 Prepared by: Lowery Rue  Exercises - Supine Posterior Pelvic Tilt  - 1 x daily - 7 x weekly - 3 sets - 10 reps - Supine Lower Trunk Rotation  - 1 x daily - 7 x weekly - 3 sets - 10 reps - Seated Hip Flexor Stretch  - 1 x daily - 7 x weekly - 1 sets - 3 reps - 30 seconds hold - Seated Thoracic Flexion and Rotation with Swiss Ball  - 1 x daily - 7 x weekly - 1 sets - 10 reps - 5 seconds hold    ASSESSMENT:  CLINICAL  IMPRESSION: Initiated exercises in clinic with pt able to tolerate all activities without increase in pain. PT educated pt on doing exercises in tolerable range and not flaring up pain.  Evaluation: Patient is a 63 y.o. female who was seen today for physical therapy evaluation and treatment for mid and low back and left hip pain. She has a history of Osteoporosis and was seen in April of this year in this clinic, but discharged due to her high pain levels and lack of tolerance to PT. She has pain and spasm with lumbar rotation and tightness in B thoracic paraspinals. She demonstrates rounded shoulders and increased thoracic kyphosis as well as decreased knowledge about body mechanics especially as it relates to osteoporosis. Her modified oswestry score is 30 / 50 = 60.0 %. She was able to complete the full evaluation with no significant change in her pain levels and should be able to tolerate PT this episode. She is getting an MRI this afternoon .She will benefit from skilled PT to address these deficits.       GOALS: Goals reviewed with patient? Yes  SHORT TERM GOALS: Target date: 08/27/2023   Patient will be independent with initial HEP.  Baseline:  Goal status: INITIAL  2.  Patient will report decreased pain by 30% with walking.  Baseline:  Goal status: INITIAL   LONG TERM GOALS: Target date: 09/24/2023   Patient will be independent with advanced/ongoing HEP to improve outcomes and carryover; specifically weightbearing exercises to address her OP.  Baseline:  Goal status: INITIAL  2.  Patient will report 75%% improvement in low back and L hip pain with ADLs to improve QOL.  Baseline:  Goal status: INITIAL  3.  Patient will demonstrate correct body mechanics for lifting and squatting to avoid further injury and allow her to care for her grandson.   Baseline:  Goal status: INITIAL  4.  Patient will demonstrate improved 5XSTS by 2-3 seconds demonstrating improved LE functional  strength. Baseline: 17.74 sec Goal status: INITIAL  5.  Patient will score 24 on the Modified Oswestry demonstrating improved functional ability.  Baseline: 30 Goal status: INITIAL   PLAN:  PT FREQUENCY: 2x/week  PT DURATION: 8 weeks  PLANNED INTERVENTIONS: [id number]- PT Re-evaluation, 97110-Therapeutic exercises, 97530- Therapeutic activity, 97112- Neuromuscular re-education, 97535- Self Care, 54098- Manual therapy, [phone]- Aquatic Therapy, [phone]- Electrical stimulation (unattended), [phone] (1-2 muscles), [id number] (3+ muscles)- Dry Needling, Patient/Family education, Joint mobilization, Spinal mobilization, Cryotherapy, and Moist heat.  PLAN FOR NEXT SESSION: assess  response to HEP, release left psoas, focus on resistance training and weightbearing exercises for OP, functional LE strength, postural strength, body mechanics and ADL modifications   Lowery Rue, PT,DPT06/11/252:43 PM

## 2023-08-12 NOTE — Therapy (Signed)
 OUTPATIENT PHYSICAL THERAPY THORACOLUMBAR TREATMENT   Patient Name: Callee Rohrig MRN: [id number] DOB:02-01-1961, 63 y.o., female Today's Date: 08/13/2023  END OF SESSION:  PT End of Session - 08/13/23 1319     Visit Number 4    Number of Visits 16    Date for PT Re-Evaluation 09/24/23    Authorization Type BCBS federal    Authorization - Visit Number 3    Progress Note Due on Visit 10    PT Start Time 1319    PT Stop Time 1359    PT Time Calculation (min) 40 min    Activity Tolerance Patient tolerated treatment well    Behavior During Therapy [facility] for tasks assessed/performed             Past Medical History:  Diagnosis Date   Anxiety    Basilar artery aneurysm (HCC)    s/p stent supported coil embolization of basilar aneurysm 12/02/20   COVID-19 10/16/2022   Dyspnea    Emphysematous COPD (HCC) 04/23/2017   GERD (gastroesophageal reflux disease)    Hyperlipidemia    Hypertension    Peripheral arterial disease (HCC)    Thyroid  nodule 04/23/2017   Past Surgical History:  Procedure Laterality Date   ABDOMINAL AORTOGRAM N/A 07/16/2017   Procedure: ABDOMINAL AORTOGRAM;  Surgeon: Margherita Shell, MD;  Location: [facility];  Service: Cardiovascular;  Laterality: N/A;   ABDOMINAL AORTOGRAM W/LOWER EXTREMITY Bilateral 12/02/2018   Procedure: ABDOMINAL AORTOGRAM W/LOWER EXTREMITY;  Surgeon: Margherita Shell, MD;  Location: [facility];  Service: Cardiovascular;  Laterality: Bilateral;   AORTIC ARCH ANGIOGRAPHY N/A 12/02/2018   Procedure: AORTIC ARCH ANGIOGRAPHY;  Surgeon: Margherita Shell, MD;  Location: [facility];  Service: Cardiovascular;  Laterality: N/A;   basilaraneurysm repair  11/2020   CAROTID-SUBCLAVIAN BYPASS GRAFT Left 02/22/2021   Procedure: LEFT CAROTID-SUBCLAVIAN BYPASS USING HEAMSHIELD GOLD X30MM;  Surgeon: Margherita Shell, MD;  Location: [facility] OR;  Service: Vascular;  Laterality: Left;   ENDOMETRIAL BIOPSY  2005   FEMORAL ARTERY  STENT Bilateral 2008   IR 3D INDEPENDENT WKST  12/02/2020   IR ANGIO INTRA EXTRACRAN SEL INTERNAL CAROTID UNI R MOD SED  11/08/2020   IR ANGIO VERTEBRAL SEL SUBCLAVIAN INNOMINATE UNI R MOD SED  11/08/2020   IR ANGIO VERTEBRAL SEL VERTEBRAL BILAT MOD SED  12/02/2020   IR ANGIO VERTEBRAL SEL VERTEBRAL UNI R MOD SED  06/16/2021   IR ANGIOGRAM FOLLOW UP STUDY  12/02/2020   IR ANGIOGRAM FOLLOW UP STUDY  12/02/2020   IR ANGIOGRAM FOLLOW UP STUDY  12/02/2020   IR ANGIOGRAM FOLLOW UP STUDY  12/02/2020   IR TRANSCATH/EMBOLIZ  12/02/2020   IR US  GUIDE VASC ACCESS RIGHT  11/08/2020   IR US  GUIDE VASC ACCESS RIGHT  12/02/2020   IR US  GUIDE VASC ACCESS RIGHT  06/16/2021   IR US  GUIDE VASC ACCESS RIGHT  06/16/2021   LOWER EXTREMITY ANGIOGRAPHY Bilateral 07/16/2017   Procedure: Lower Extremity Angiography;  Surgeon: Margherita Shell, MD;  Location: [facility];  Service: Cardiovascular;  Laterality: Bilateral;   NASAL SINUS SURGERY     PERIPHERAL VASCULAR BALLOON ANGIOPLASTY Bilateral 07/16/2017   Procedure: PERIPHERAL VASCULAR BALLOON ANGIOPLASTY;  Surgeon: Margherita Shell, MD;  Location: [facility];  Service: Cardiovascular;  Laterality: Bilateral;  iliacs   PERIPHERAL VASCULAR INTERVENTION Bilateral 12/02/2018   Procedure: PERIPHERAL VASCULAR INTERVENTION;  Surgeon: Margherita Shell, MD;  Location: [facility];  Service: Cardiovascular;  Laterality: Bilateral;   RADIOLOGY WITH ANESTHESIA N/A 12/02/2020   Procedure: Arteriogram, Surpass embolization of basilar aneurysm;  Surgeon: Augusto Blonder, MD;  [facility];  Service: Radiology;  Laterality: N/A;   TONSILLECTOMY     TUBAL LIGATION     VASCULAR SURGERY     Patient Active Problem List   Diagnosis Date Noted   Left groin pain 07/19/2023   Lumbar degenerative disc disease 06/28/2023   Age-related osteoporosis without current pathological fracture 12/19/2022   OSA (obstructive sleep apnea) 08/29/2022   IFG (impaired  fasting glucose) 08/29/2022   History of basal cell cancer, nose 10/12/2021   Thoracic aortic ectasia (HCC) 08/17/2021   Left subclavian artery occlusion 02/22/2021   Aneurysm, cerebral, nonruptured 12/02/2020   Anxiety state 12/25/2017   Thyroid  nodule 04/23/2017   Emphysematous COPD (HCC) 04/23/2017   Neuropathy involving both lower extremities 04/22/2017   Pleuritic chest pain 04/22/2017   Essential hypertension 05/01/2016   Abnormal findings on esophagogastroduodenoscopy (EGD) 03/18/2016   BPPV (benign paroxysmal positional vertigo) 06/28/2015   Irritable bowel syndrome 04/01/2015   Dyslipidemia, goal LDL below 70 04/01/2015   Multinodular goiter 12/19/2011   Constipation 07/28/2011   Dyslipidemia 07/28/2011   Acid reflux 09/25/2006   Peripheral vascular disease (HCC) 09/25/2006    PCP: Cydney Draft, MD   REFERRING PROVIDER: Gean Keels MD   REFERRING DIAG: M51.360 (ICD-10-CM) - Degeneration of intervertebral disc of lumbar region with discogenic back pain - Hip and back  Rationale for Evaluation and Treatment: Rehabilitation  THERAPY DIAG:  Pain in thoracic spine  Other low back pain  Muscle weakness (generalized)  Mid back pain  Other symptoms and signs involving the musculoskeletal system  Acute bilateral low back pain without sciatica  ONSET DATE: 2024 after caring for her mom  SUBJECTIVE:                                                                                                                                                                                           SUBJECTIVE STATEMENT: No change in symptoms, MRI results are back. Patient states her worst pain is mid back which radiates to the Rt when reaching with Rt UE.  Evaluation: Patient reporting pain from bra line down and then laterally to R. Also having L hip pain in the front to where she can't hardly walk. Intermittent tingling down B legs.   PERTINENT HISTORY:   Osteoporosis , Brain aneurysm w/ surgery 10/22, CAD  PAIN:  Are you having pain? Yes: NPRS scale: 4/10 now up to 10/10 Pain location: bra line to low back and off to  R>L Pain description: throbbing, pulling Aggravating factors: twisting to the R, lifting, lying on left side Relieving factors: sitting straight  PRECAUTIONS: Other: OP  RED FLAGS: None   WEIGHT BEARING RESTRICTIONS: No  FALLS:  Has patient fallen in last 6 months? 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  DIAGNOSTIC FINDINGS:  MRI 07/30/23  IMPRESSION: Facet degenerative disease at L5-S1 is worse on the left and results in trace anterolisthesis L5 on S1. The central canal and foramina are widely patent at all levels with minimal disc bulging at L5-S1 noted.  Lumbar XR IMPRESSION: Mild degenerative joint changes at L5-S1.  PATIENT SURVEYS:  Modified Oswestry 30 / 50 = 60.0 %   COGNITION: Overall cognitive status: Within functional limits for tasks assessed     SENSATION: Not tested  MUSCLE LENGTH: B HS tightness R>L and R psoas tightness  POSTURE: rounded shoulders, forward head, decreased lumbar lordosis, increased thoracic kyphosis, flexed trunk   PALPATION: Tightness in B thoracic paraspinals  LUMBAR ROM: WFL - Left SB pulling on R, left rotation caused cramping L bra line   LOWER EXTREMITY ROM:     LOWER EXTREMITY MMT:  core weakness with hip flexor testing  MMT Right eval Left eval  Hip flexion 4 5  Hip  extension 5 5  Hip abduction 5 5  Hip adduction 5 5  Hip internal rotation    Hip external rotation    Knee flexion    Knee extension    Ankle dorsiflexion    Ankle plantarflexion    Ankle inversion    Ankle eversion     (Blank rows = not tested)  FUNCTIONAL TESTS:  5 times sit to stand: 17.74  [facility] Adult PT Treatment:                                                DATE: 08/13/23 Therapeutic Exercise: Cat/Cow x 10 Quadruped tail wags x 10 Seated lumbar flex/ext  Hip flexor stretch sitting with OH reach with SB 2 x 30 sec B NMRE Hooklying PPT x 10 TA contraction 5 sec hold x 10 TA + BKFO x 10 ea TA + sequential march x 5 B Prone hip ext x 10 B (added pillow under waist) Self Care: Explanation of facet joints and need to mobilize  [facility] Adult PT Treatment:                                                DATE: 08/07/23 Therapeutic Exercise/Activity: Standing lumbar AROM with some increased Rt side pain with lateral flexion, no pain with flexion/extension Hip flexor stretch standing 2 x 20 sec Hip flexor stretch sitting  2 x 20 sec LTR x 2 min in  tolerable range Posterior pelvic tilt 10 x 3 sec hold with tactile cues Posterior tilt with bent knee fall out x 10 with tactile cues Physioball roll out laterally Manual Therapy: STM lumbar and thoracic paraspinals, lats   [facility] Adult PT Treatment:                                                DATE: 08/01/2023 Manual Therapy: Prone: Soft tissue mobilization to thoracic/lumbar paraspinals, latissimus, and nearby tissues Gentle cephalad glides through the rib cage +/- caudal glides at the pelvis R and L General distraction glides throughout the thoracic and lumbar spine  TREATMENT DATE:                                                                                                                               07/30/23 See pt ed and HEP   PATIENT EDUCATION:  Education details: PT eval findings, anticipated POC, initial HEP,  postural awareness, posture and body mechanics for typical daily postioning, mobility and household tasks, and Osteoporosis education  Person educated: Patient Education method: Explanation, Demonstration, and Handouts Education comprehension: verbalized understanding and returned demonstration  HOME EXERCISE PROGRAM:  Access Code: [email or web address] URL: [email or web address] Date: 08/13/2023 Prepared by: Concha Deed  Exercises - Supine Posterior Pelvic Tilt  - 1 x daily - 7 x weekly - 3 sets - 10 reps - Supine Lower Trunk Rotation  - 1 x daily - 7 x weekly - 3 sets - 10 reps - Seated Hip Flexor Stretch  - 1 x daily - 7 x weekly - 1 sets - 3 reps - 30 seconds hold - Seated Thoracic Flexion and Rotation with Swiss Ball  - 1 x daily - 7 x weekly - 1 sets - 10 reps - 5 seconds hold - Hooklying Sequential Leg March and Lower  - 1 x daily - 3 x weekly - 2 sets - 10 reps - Prone Hip Extension  - 1 x daily - 3 x weekly - 2 sets - 10 reps - Cat Cow  - 1 x daily - 7 x weekly - 2 sets - 10 reps    ASSESSMENT:  CLINICAL IMPRESSION: MRI results came back with Left L5/S1 facet DDD that has worsened. We progressed core strength today with good tolerance and added facet mobility exercises with no complaints. Hip flexor stretch is very effective.   Evaluation: Patient is a 63 y.o. female who was seen today for physical therapy evaluation and treatment for mid and low back and left hip pain. She has a history of Osteoporosis and was seen in April of this year in this clinic, but discharged due to her high pain levels and lack of tolerance to PT. She has pain and spasm with lumbar rotation and tightness in B thoracic paraspinals. She demonstrates rounded shoulders  and increased thoracic kyphosis as well as decreased knowledge about body mechanics especially as it relates to osteoporosis. Her modified oswestry score is 30 / 50 = 60.0 %. She was able to complete the full evaluation with no significant change  in her pain levels and should be able to tolerate PT this episode. She is getting an MRI this afternoon .She will benefit from skilled PT to address these deficits.       GOALS: Goals reviewed with patient? Yes  SHORT TERM GOALS: Target date: 08/27/2023   Patient will be independent with initial HEP.  Baseline:  Goal status: INITIAL  2.  Patient will report decreased pain by 30% with walking.  Baseline:  Goal status: INITIAL   LONG TERM GOALS: Target date: 09/24/2023   Patient will be independent with advanced/ongoing HEP to improve outcomes and carryover; specifically weightbearing exercises to address her OP.  Baseline:  Goal status: INITIAL  2.  Patient will report 75%% improvement in low back and L hip pain with ADLs to improve QOL.  Baseline:  Goal status: INITIAL  3.  Patient will demonstrate correct body mechanics for lifting and squatting to avoid further injury and allow her to care for her grandson.   Baseline:  Goal status: INITIAL  4.  Patient will demonstrate improved 5XSTS by 2-3 seconds demonstrating improved LE functional strength. Baseline: 17.74 sec Goal status: INITIAL  5.  Patient will score 24 on the Modified Oswestry demonstrating improved functional ability.  Baseline: 30 Goal status: INITIAL   PLAN:  PT FREQUENCY: 2x/week  PT DURATION: 8 weeks  PLANNED INTERVENTIONS: 97164- PT Re-evaluation, 97110-Therapeutic exercises, 97530- Therapeutic activity, 97112- Neuromuscular re-education, 97535- Self Care, 08657- Manual therapy, [phone]- Aquatic Therapy, [phone]- Electrical stimulation (unattended), [phone] (1-2 muscles), 20561 (3+ muscles)- Dry Needling, Patient/Family education, Joint mobilization, Spinal mobilization, Cryotherapy, and Moist heat.  PLAN FOR NEXT SESSION: assess response to HEP, release left psoas, focus on resistance training and weightbearing exercises for OP, functional LE strength, postural strength, body mechanics and ADL  modifications    Jinx Mourning, PT  06/17/252:08 PM

## 2023-08-12 NOTE — Telephone Encounter (Signed)
 Please see mychart message sent by pt and advise.

## 2023-08-13 ENCOUNTER — Ambulatory Visit: Admitting: Physical Therapy

## 2023-08-13 ENCOUNTER — Encounter: Payer: Self-pay | Admitting: Physical Therapy

## 2023-08-13 DIAGNOSIS — M6281 Muscle weakness (generalized): Secondary | ICD-10-CM

## 2023-08-13 DIAGNOSIS — M5136 Other intervertebral disc degeneration, lumbar region with discogenic back pain only: Secondary | ICD-10-CM | POA: Diagnosis not present

## 2023-08-13 DIAGNOSIS — M549 Dorsalgia, unspecified: Secondary | ICD-10-CM

## 2023-08-13 DIAGNOSIS — R29898 Other symptoms and signs involving the musculoskeletal system: Secondary | ICD-10-CM

## 2023-08-13 DIAGNOSIS — M546 Pain in thoracic spine: Secondary | ICD-10-CM

## 2023-08-13 DIAGNOSIS — M5459 Other low back pain: Secondary | ICD-10-CM | POA: Diagnosis not present

## 2023-08-13 DIAGNOSIS — M545 Low back pain, unspecified: Secondary | ICD-10-CM | POA: Diagnosis not present

## 2023-08-13 MED ORDER — PREGABALIN 75 MG PO CAPS: 75.0000 mg | ORAL_CAPSULE | Freq: Two times a day (BID) | ORAL | 0 refills | Status: DC

## 2023-08-13 NOTE — Telephone Encounter (Signed)
 Routing pt's response back to Dr. Elva Hamburger for further review.

## 2023-08-13 NOTE — Telephone Encounter (Signed)
 Okay I will just send in a couple of weeks of the 75 mg to do twice a day, after a week or 2 let me know if you would like to go up to 100 mg.

## 2023-08-15 ENCOUNTER — Ambulatory Visit: Admitting: Physical Therapy

## 2023-08-19 NOTE — Telephone Encounter (Signed)
Please see recent message sent by pt and advise.

## 2023-08-20 ENCOUNTER — Ambulatory Visit (HOSPITAL_BASED_OUTPATIENT_CLINIC_OR_DEPARTMENT_OTHER): Attending: Family Medicine | Admitting: Internal Medicine

## 2023-08-20 DIAGNOSIS — G4733 Obstructive sleep apnea (adult) (pediatric): Secondary | ICD-10-CM | POA: Diagnosis not present

## 2023-08-20 MED ORDER — PREGABALIN 25 MG PO CAPS: ORAL_CAPSULE | ORAL | 3 refills | Status: DC

## 2023-08-20 NOTE — Addendum Note (Signed)
 Addended by: CURTIS DEBBY PARAS on: 08/20/2023 02:49 PM   Modules accepted: Orders

## 2023-08-20 NOTE — Telephone Encounter (Signed)
 Routing new message from pt back to Dr. ONEIDA for him to review.

## 2023-08-21 DIAGNOSIS — I671 Cerebral aneurysm, nonruptured: Secondary | ICD-10-CM | POA: Diagnosis not present

## 2023-08-21 DIAGNOSIS — Z6829 Body mass index (BMI) 29.0-29.9, adult: Secondary | ICD-10-CM | POA: Diagnosis not present

## 2023-08-22 ENCOUNTER — Ambulatory Visit: Admitting: Physical Therapy

## 2023-08-22 ENCOUNTER — Encounter: Payer: Self-pay | Admitting: Physical Therapy

## 2023-08-22 DIAGNOSIS — M5459 Other low back pain: Secondary | ICD-10-CM

## 2023-08-22 DIAGNOSIS — R29898 Other symptoms and signs involving the musculoskeletal system: Secondary | ICD-10-CM

## 2023-08-22 DIAGNOSIS — M546 Pain in thoracic spine: Secondary | ICD-10-CM | POA: Diagnosis not present

## 2023-08-22 DIAGNOSIS — M549 Dorsalgia, unspecified: Secondary | ICD-10-CM | POA: Diagnosis not present

## 2023-08-22 DIAGNOSIS — M5136 Other intervertebral disc degeneration, lumbar region with discogenic back pain only: Secondary | ICD-10-CM | POA: Diagnosis not present

## 2023-08-22 DIAGNOSIS — M6281 Muscle weakness (generalized): Secondary | ICD-10-CM | POA: Diagnosis not present

## 2023-08-22 DIAGNOSIS — M545 Low back pain, unspecified: Secondary | ICD-10-CM | POA: Diagnosis not present

## 2023-08-22 NOTE — Therapy (Signed)
 OUTPATIENT PHYSICAL THERAPY THORACOLUMBAR TREATMENT   Patient Name: Tina Navarro MRN: [id number] DOB:Feb 04, 1961, 63 y.o., female Today's Date: 08/22/2023  END OF SESSION:  PT End of Session - 08/22/23 1316     Visit Number 5    Number of Visits 16    Date for PT Re-Evaluation 09/24/23    Authorization Type BCBS federal    Authorization - Visit Number 4    Progress Note Due on Visit 10    PT Start Time 1317    PT Stop Time 1400    PT Time Calculation (min) 43 min    Activity Tolerance Patient tolerated treatment well    Behavior During Therapy [facility] for tasks assessed/performed             Past Medical History:  Diagnosis Date   Anxiety    Basilar artery aneurysm (HCC)    s/p stent supported coil embolization of basilar aneurysm 12/02/20   COVID-19 10/16/2022   Dyspnea    Emphysematous COPD (HCC) 04/23/2017   GERD (gastroesophageal reflux disease)    Hyperlipidemia    Hypertension    Peripheral arterial disease (HCC)    Thyroid  nodule 04/23/2017   Past Surgical History:  Procedure Laterality Date   ABDOMINAL AORTOGRAM N/A 07/16/2017   Procedure: ABDOMINAL AORTOGRAM;  Surgeon: Serene Gaile ORN, MD;  Location: [facility];  Service: Cardiovascular;  Laterality: N/A;   ABDOMINAL AORTOGRAM W/LOWER EXTREMITY Bilateral 12/02/2018   Procedure: ABDOMINAL AORTOGRAM W/LOWER EXTREMITY;  Surgeon: Serene Gaile ORN, MD;  Location: [facility];  Service: Cardiovascular;  Laterality: Bilateral;   AORTIC ARCH ANGIOGRAPHY N/A 12/02/2018   Procedure: AORTIC ARCH ANGIOGRAPHY;  Surgeon: Serene Gaile ORN, MD;  Location: [facility];  Service: Cardiovascular;  Laterality: N/A;   basilaraneurysm repair  11/2020   CAROTID-SUBCLAVIAN BYPASS GRAFT Left 02/22/2021   Procedure: LEFT CAROTID-SUBCLAVIAN BYPASS USING HEAMSHIELD GOLD X30MM;  Surgeon: Serene Gaile ORN, MD;  Location: [facility] OR;  Service: Vascular;  Laterality: Left;   ENDOMETRIAL BIOPSY  2005   FEMORAL ARTERY  STENT Bilateral 2008   IR 3D INDEPENDENT WKST  12/02/2020   IR ANGIO INTRA EXTRACRAN SEL INTERNAL CAROTID UNI R MOD SED  11/08/2020   IR ANGIO VERTEBRAL SEL SUBCLAVIAN INNOMINATE UNI R MOD SED  11/08/2020   IR ANGIO VERTEBRAL SEL VERTEBRAL BILAT MOD SED  12/02/2020   IR ANGIO VERTEBRAL SEL VERTEBRAL UNI R MOD SED  06/16/2021   IR ANGIOGRAM FOLLOW UP STUDY  12/02/2020   IR ANGIOGRAM FOLLOW UP STUDY  12/02/2020   IR ANGIOGRAM FOLLOW UP STUDY  12/02/2020   IR ANGIOGRAM FOLLOW UP STUDY  12/02/2020   IR TRANSCATH/EMBOLIZ  12/02/2020   IR US  GUIDE VASC ACCESS RIGHT  11/08/2020   IR US  GUIDE VASC ACCESS RIGHT  12/02/2020   IR US  GUIDE VASC ACCESS RIGHT  06/16/2021   IR US  GUIDE VASC ACCESS RIGHT  06/16/2021   LOWER EXTREMITY ANGIOGRAPHY Bilateral 07/16/2017   Procedure: Lower Extremity Angiography;  Surgeon: Serene Gaile ORN, MD;  Location: [facility];  Service: Cardiovascular;  Laterality: Bilateral;   NASAL SINUS SURGERY     PERIPHERAL VASCULAR BALLOON ANGIOPLASTY Bilateral 07/16/2017   Procedure: PERIPHERAL VASCULAR BALLOON ANGIOPLASTY;  Surgeon: Serene Gaile ORN, MD;  Location: [facility];  Service: Cardiovascular;  Laterality: Bilateral;  iliacs   PERIPHERAL VASCULAR INTERVENTION Bilateral 12/02/2018   Procedure: PERIPHERAL VASCULAR INTERVENTION;  Surgeon: Serene Gaile ORN, MD;  Location: [facility];  Service: Cardiovascular;  Laterality: Bilateral;   RADIOLOGY WITH ANESTHESIA N/A 12/02/2020   Procedure: Arteriogram, Surpass embolization of basilar aneurysm;  Surgeon: Lanis Pupa, MD;  Location: [facility] OR;  Service: Radiology;  Laterality: N/A;   TONSILLECTOMY     TUBAL LIGATION     VASCULAR SURGERY     Patient Active Problem List   Diagnosis Date Noted   Left groin pain 07/19/2023   Lumbar degenerative disc disease 06/28/2023   Age-related osteoporosis without current pathological fracture 12/19/2022   OSA (obstructive sleep apnea) 08/29/2022   IFG (impaired  fasting glucose) 08/29/2022   History of basal cell cancer, nose 10/12/2021   Thoracic aortic ectasia (HCC) 08/17/2021   Left subclavian artery occlusion 02/22/2021   Aneurysm, cerebral, nonruptured 12/02/2020   Anxiety state 12/25/2017   Thyroid  nodule 04/23/2017   Emphysematous COPD (HCC) 04/23/2017   Neuropathy involving both lower extremities 04/22/2017   Pleuritic chest pain 04/22/2017   Essential hypertension 05/01/2016   Abnormal findings on esophagogastroduodenoscopy (EGD) 03/18/2016   BPPV (benign paroxysmal positional vertigo) 06/28/2015   Irritable bowel syndrome 04/01/2015   Dyslipidemia, goal LDL below 70 04/01/2015   Multinodular goiter 12/19/2011   Constipation 07/28/2011   Dyslipidemia 07/28/2011   Acid reflux 09/25/2006   Peripheral vascular disease (HCC) 09/25/2006    PCP: Alvan Dorothyann BIRCH, MD   REFERRING PROVIDER: Curtis Debby PARAS MD   REFERRING DIAG: M51.360 (ICD-10-CM) - Degeneration of intervertebral disc of lumbar region with discogenic back pain - Hip and back  Rationale for Evaluation and Treatment: Rehabilitation  THERAPY DIAG:  Pain in thoracic spine  Other low back pain  Muscle weakness (generalized)  Mid back pain  Other symptoms and signs involving the musculoskeletal system  ONSET DATE: 2024 after caring for her mom  SUBJECTIVE:                                                                                                                                                                                           SUBJECTIVE STATEMENT: Pain is referring into R gluteal from low back instead of both sides. Feeling it more in ant R hip than L.   Evaluation: Patient reporting pain from bra line down and then laterally to R. Also having L hip pain in the front to where she can't hardly walk. Intermittent tingling down B legs.   PERTINENT HISTORY:  Osteoporosis , Brain aneurysm w/ surgery 10/22, CAD  PAIN:  Are you having pain?  Yes: NPRS scale: 4/10 now up to 10/10 Pain location: bra line to low back and off to R>L Pain description: throbbing, pulling Aggravating factors: twisting to the R,  lifting, lying on left side Relieving factors: sitting straight  PRECAUTIONS: Other: OP  RED FLAGS: None   WEIGHT BEARING RESTRICTIONS: No  FALLS:  Has patient fallen in last 6 months? 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  DIAGNOSTIC FINDINGS:  MRI 07/30/23  IMPRESSION: Facet degenerative disease at L5-S1 is worse on the left and results in trace anterolisthesis L5 on S1. The central canal and foramina are widely patent at all levels with minimal disc bulging at L5-S1 noted.  Lumbar XR IMPRESSION: Mild degenerative joint changes at L5-S1.  PATIENT SURVEYS:  Modified Oswestry 30 / 50 = 60.0 %   COGNITION: Overall cognitive status: Within functional limits for tasks assessed     SENSATION: Not tested  MUSCLE LENGTH: B HS tightness R>L and R psoas tightness  POSTURE: rounded shoulders, forward head, decreased lumbar lordosis, increased thoracic kyphosis, flexed trunk   PALPATION: Tightness in B thoracic paraspinals  LUMBAR ROM: WFL - Left SB pulling on R, left rotation caused cramping L bra line   LOWER EXTREMITY ROM:     LOWER EXTREMITY MMT:  core weakness with hip flexor testing  MMT Right eval Left eval  Hip flexion 4 5  Hip extension 5 5  Hip abduction 5 5  Hip adduction 5 5  Hip internal rotation    Hip  external rotation    Knee flexion    Knee extension    Ankle dorsiflexion    Ankle plantarflexion    Ankle inversion    Ankle eversion     (Blank rows = not tested)  FUNCTIONAL TESTS:  5 times sit to stand: 17.74   [facility] Adult PT Treatment:                                                DATE: 08/22/23 Therapeutic Exercise: Lumbar mobility in standing against noodle - pt unable to flatten spine Seated lumbar flex/ext on dyna disc to work on isolating lumbar spine from throacic - requires PT TC to chest to avoid thoracic kyphosis Hang from matrix for lat stretch Low trap lift off x 4 facing wall Snow angel at wall - very limited Back to wall in goal post position - active retraction to wall x 10 NMRE Hooklying PPT x 10 TA contraction 5 sec hold x 10 TA + BKFO x 10 ea TA + leg extension x 10  TA + sequential march x 10 B. Supine Green band B shoulder ext with PT as anchor and alt march Prone hip ext 2x 10 B (added pillow under waist)   [facility] Adult PT Treatment:  DATE: 08/13/23 Therapeutic Exercise: Cat/Cow x 10 Quadruped tail wags x 10 Seated lumbar flex/ext  Hip flexor stretch sitting with OH reach with SB 2 x 30 sec B NMRE Hooklying PPT x 10 TA contraction 5 sec hold x 10 TA + BKFO x 10 ea TA + sequential march x 5 B Prone hip ext x 10 B (added pillow under waist) Self Care: Explanation of facet joints and need to mobilize  [facility] Adult PT Treatment:                                                DATE: 08/07/23 Therapeutic Exercise/Activity: Standing lumbar AROM with some increased Rt side pain with lateral flexion, no pain with flexion/extension Hip flexor stretch standing 2 x 20 sec Hip flexor stretch sitting  2 x 20 sec LTR x 2 min in tolerable range Posterior pelvic tilt 10 x 3 sec hold with tactile cues Posterior tilt with bent knee fall out x 10 with tactile cues Physioball roll out laterally Manual Therapy: STM  lumbar and thoracic paraspinals, lats   [facility] Adult PT Treatment:                                                DATE: 08/01/2023 Manual Therapy: Prone: Soft tissue mobilization to thoracic/lumbar paraspinals, latissimus, and nearby tissues Gentle cephalad glides through the rib cage +/- caudal glides at the pelvis R and L General distraction glides throughout the thoracic and lumbar spine  TREATMENT DATE:                                                                                                                               07/30/23 See pt ed and HEP   PATIENT EDUCATION:  Education details: PT eval findings, anticipated POC, initial HEP, postural awareness, posture and body mechanics for typical daily postioning, mobility and household tasks, and Osteoporosis education  Person educated: Patient Education method: Explanation, Demonstration, and Handouts Education comprehension: verbalized understanding and returned demonstration  HOME EXERCISE PROGRAM:  Access Code: [email or web address] URL: [email or web address] Date: 08/22/2023 Prepared by: Mliss  Exercises - Supine Posterior Pelvic Tilt  - 1 x daily - 7 x weekly - 3 sets - 10 reps - Supine Lower Trunk Rotation  - 1 x daily - 7 x weekly - 3 sets - 10 reps - Seated Hip Flexor Stretch  - 1 x daily - 7 x weekly - 1 sets - 3 reps - 30 seconds hold - Seated Thoracic Flexion and Rotation with Swiss Ball  - 1 x daily - 7 x weekly - 1 sets - 10 reps - 5 seconds hold - Hooklying Sequential Leg March and Lower  -  1 x daily - 3 x weekly - 2 sets - 10 reps - Prone Hip Extension  - 1 x daily - 3 x weekly - 2 sets - 10 reps - Cat Cow  - 1 x daily - 7 x weekly - 2 sets - 10 reps - Standing Scapular Retraction in Abduction  - 1 x daily - 7 x weekly - 2 sets - 10 reps - 2-3 sec hold - Seated Pelvic Tilt  - 1 x daily - 7 x weekly - 2 sets - 10 reps    ASSESSMENT:  CLINICAL IMPRESSION: Patient has a lot of difficulty isolating lumbar  mobility from thoracic mobility. She did best when sitting on dyna disc but required tactile cues to chest to prevent thoracic kyphosis. Added scapular retraction in standing in goal post position to encourage greater thoracic extension. She would benefit from more thoracic extension exercises next visit. Pain has centralized out of her left glute to just the right side now. She also demonstrated weakness with squatting to floor and 1/2 kneel to floor at end of session.   Evaluation: Patient is a 63 y.o. female who was seen today for physical therapy evaluation and treatment for mid and low back and left hip pain. She has a history of Osteoporosis and was seen in April of this year in this clinic, but discharged due to her high pain levels and lack of tolerance to PT. She has pain and spasm with lumbar rotation and tightness in B thoracic paraspinals. She demonstrates rounded shoulders and increased thoracic kyphosis as well as decreased knowledge about body mechanics especially as it relates to osteoporosis. Her modified oswestry score is 30 / 50 = 60.0 %. She was able to complete the full evaluation with no significant change in her pain levels and should be able to tolerate PT this episode. She is getting an MRI this afternoon .She will benefit from skilled PT to address these deficits.       GOALS: Goals reviewed with patient? Yes  SHORT TERM GOALS: Target date: 08/27/2023   Patient will be independent with initial HEP.  Baseline:  Goal status: INITIAL  2.  Patient will report decreased pain by 30% with walking.  Baseline:  Goal status: INITIAL   LONG TERM GOALS: Target date: 09/24/2023   Patient will be independent with advanced/ongoing HEP to improve outcomes and carryover; specifically weightbearing exercises to address her OP.  Baseline:  Goal status: INITIAL  2.  Patient will report 75%% improvement in low back and L hip pain with ADLs to improve QOL.  Baseline:  Goal status:  INITIAL  3.  Patient will demonstrate correct body mechanics for lifting and squatting to avoid further injury and allow her to care for her grandson.   Baseline:  Goal status: INITIAL  4.  Patient will demonstrate improved 5XSTS by 2-3 seconds demonstrating improved LE functional strength. Baseline: 17.74 sec Goal status: INITIAL  5.  Patient will score 24 on the Modified Oswestry demonstrating improved functional ability.  Baseline: 30 Goal status: INITIAL   PLAN:  PT FREQUENCY: 2x/week  PT DURATION: 8 weeks  PLANNED INTERVENTIONS: 97164- PT Re-evaluation, 97110-Therapeutic exercises, 97530- Therapeutic activity, 97112- Neuromuscular re-education, 97535- Self Care, 02859- Manual therapy, [phone]- Aquatic Therapy, [phone]- Electrical stimulation (unattended), [phone] (1-2 muscles), 20561 (3+ muscles)- Dry Needling, Patient/Family education, Joint mobilization, Spinal mobilization, Cryotherapy, and Moist heat.  PLAN FOR NEXT SESSION: assess response to HEP, work on lumbar mobiliyt,, focus on resistance training and weightbearing  exercises for OP, functional LE strength, postural strength, body mechanics and ADL modifications    [employer], PT  06/26/252:15 PM

## 2023-08-25 NOTE — Procedures (Signed)
 [facility] [address] [place], [place] [address] Tel: [phone]   Fax: [phone]  Home Sleep Test Interpretation  Patient Name: Tina Navarro, Tina Navarro Date: 08/20/2023  Date of Birth: 01-14-1961 Study Type: HST  Age: 63 year MRN #: [id number]  Sex: Female Interpreting Physician: NEYSA RAMA, [id number]  Height: 5' 5 Referring Physician: Dorothyann Byars, MD  Weight: 174.0 lbs Recording Tech: Silvano Neeriemer RPSGT RST  BMI: 29.0 Scoring Tech: Will Poet RRT RPSGT RST  ESS: 7 Neck Size: 14.5   Indications for Polysomnography The patient is a 63 year-old Female who is 5' 5 and weighs 174.0 lbs. Her BMI equals 29.0.  A home sleep apnea test was performed to evaluate for -OSA.  Medication  No Data.   Polysomnogram Data A home sleep test recorded the standard physiologic parameters including EKG, nasal and oral airflow.  Respiratory parameters of chest and abdominal movements were recorded with Respiratory Inductance Plethysmography belts.  Oxygen saturation was recorded by pulse oximetry.   Study Architecture The total recording time of the polysomnogram was 378.5 minutes.  The total monitoring time was 379.0 minutes.  Time spent in Supine position was - minutes.   Respiratory Events The study revealed a presence of 14 obstructive, - central, and - mixed apneas resulting in an Apnea index of 2.2 events per hour.  There were 30 hypopneas (>=3% desaturation and/or arousal) resulting in an Apnea\Hypopnea Index (AHI >=3% desaturation and/or arousal) of 7.0 events per hour.  There were 14 hypopneas (>=4% desaturation) resulting in an Apnea\Hypopnea Index (AHI >=4% desaturation) of 4.4 events per hour.  There were - Respiratory Effort Related Arousals resulting in a RERA index of - events per hour. The Respiratory Disturbance Index is 7.0 events per hour.  The snore index was - events per hour.  Mean oxygen saturation was 95.6%.  The  lowest oxygen saturation during monitoring time was 85.0%.  Time spent <=88% oxygen saturation was 0.7 minutes (0.2%).  Cardiac Summary The average pulse rate was 92.4 bpm.  The minimum pulse rate was 81.0 bpm while the maximum pulse rate was 111.0 bpm.  Cardiac rhythm was normal.  Comment:  Mild obstructive sleep apnea, AHI (3%) 7/hr. Snoring with oxygen desaturation to a nadir of 85%, mean 95.6%.  Diagnosis: Obstructive sleep apnea  Recommendations: Conservative measures may include observation, weight loss and sleep position off back. Other options, including autopap, cpap titration sleep study or fitted oral appliance, would be based on clinical judgment.   This study was personally reviewed and electronically signed by: Dr. RAMA JONETTA NEYSA Accredited Board Certified in Sleep Medicine Date/Time: 08/25/23  1:24   Study Overview  Recording Time: 523.4 min. Monitoring Time: 379.0 min.  Analysis Start:  10:54:16 PM Supine Time: - min.  Analysis Stop:  05:12:48 AM     Study Summary   Count Index Longest Event Duration  Apneas & Hypopneas: 44 7.0  Apneas: 135.8 sec.     Hypopneas: 111.9 sec.  RERAs: - - - sec.  Desaturations: 34 5.4 118.5 sec.  Snores: - - - sec.    Minimum Oxygen Saturation: 85.0%    Respiratory Summary   Total Duration Supine Non-Supine   Count Index Average Longest Count Index Count Index  Obstructive Apnea 14 2.2 34.9 135.8 - - 14 2.2   Mixed Apnea - - - - - - - -   Central Apnea - - - - - - - -  Total Apneas 14 2.2 34.9 135.8 - - 14 2.2            Hypopneas 3% 30 4.7 N.A. N.A. - - 30 4.7   Apneas & Hyp. 3% 44 7.0 N.A. N.A. - - 44 7.0            Hypopneas 4% 14 2.2 N.A. N.A. - - 14 2.2  Apneas & Hyp. 4% 28 4.4 N.A. N.A. - - 28 4.4             RERAs - - - - - - - -  RDI 45 7.1 N.A. N.A. - - 45 7.1   Oxygen Saturation Summary   Total Supine Non-Supine  Average SpO2 95.6% - 95.6%  Minimum SpO2 85.0% - 85.0%   Maximum SpO2 99.0% - 99.0%    Oxygen Saturation Distribution  Range (%) Time in range (min) Time in range (%)  90.0 - 100.0 377.1 99.7%  80.0 - 90.0 1.2 0.3%  70.0 - 80.0 - -  60.0 - 70.0 - -  50.0 - 60.0 - -  0.0 - 50.0 - -  Time Spent <=88% SpO2  Range (%) Time in range (min) Time in range (%)  0.0 - 88.0 0.7 0.2%  Cardiac Summary   Total Supine Non-Supine  Average Pulse Rate (BPM) 92.4 - 92.4  Minimum Pulse Rate (BPM) 81.0 - 81.0  Maximum Pulse Rate (BPM) 111.0 - 111.0                      Technologist Comments  -                      Reggy Neysa Bateman, Biomedical engineer of Sleep Medicine  ELECTRONICALLY SIGNED ON:  08/25/2023, 1:20 PM [facility] PH: [phone]   FX: [phone] ACCREDITED BY THE [employer]ICAN [employer] OF SLEEP MEDICINE

## 2023-08-26 ENCOUNTER — Telehealth: Payer: Self-pay | Admitting: Family Medicine

## 2023-08-26 NOTE — Telephone Encounter (Signed)
 Call pt: I have her sleep study rssuts back. I am happy to go over in person or virtual

## 2023-08-28 ENCOUNTER — Ambulatory Visit: Attending: Family Medicine | Admitting: Physical Therapy

## 2023-08-28 ENCOUNTER — Encounter: Payer: Self-pay | Admitting: Physical Therapy

## 2023-08-28 DIAGNOSIS — M549 Dorsalgia, unspecified: Secondary | ICD-10-CM | POA: Diagnosis not present

## 2023-08-28 DIAGNOSIS — R29898 Other symptoms and signs involving the musculoskeletal system: Secondary | ICD-10-CM | POA: Insufficient documentation

## 2023-08-28 DIAGNOSIS — M6281 Muscle weakness (generalized): Secondary | ICD-10-CM | POA: Insufficient documentation

## 2023-08-28 DIAGNOSIS — M546 Pain in thoracic spine: Secondary | ICD-10-CM | POA: Diagnosis not present

## 2023-08-28 DIAGNOSIS — M545 Low back pain, unspecified: Secondary | ICD-10-CM | POA: Diagnosis not present

## 2023-08-28 DIAGNOSIS — M5459 Other low back pain: Secondary | ICD-10-CM | POA: Insufficient documentation

## 2023-08-28 NOTE — Therapy (Signed)
 OUTPATIENT PHYSICAL THERAPY THORACOLUMBAR TREATMENT   Patient Name: Tina Navarro MRN: [id number] DOB:08/27/1960, 63 y.o., female Today's Date: 08/28/2023  END OF SESSION:  PT End of Session - 08/28/23 1446     Visit Number 6    Number of Visits 16    Date for PT Re-Evaluation 09/24/23    Authorization Type BCBS federal    Authorization - Visit Number 6    Progress Note Due on Visit 10    PT Start Time 1400    PT Stop Time 1443    PT Time Calculation (min) 43 min    Activity Tolerance Patient tolerated treatment well    Behavior During Therapy [facility] for tasks assessed/performed              Past Medical History:  Diagnosis Date   Anxiety    Basilar artery aneurysm (HCC)    s/p stent supported coil embolization of basilar aneurysm 12/02/20   COVID-19 10/16/2022   Dyspnea    Emphysematous COPD (HCC) 04/23/2017   GERD (gastroesophageal reflux disease)    Hyperlipidemia    Hypertension    Peripheral arterial disease (HCC)    Thyroid  nodule 04/23/2017   Past Surgical History:  Procedure Laterality Date   ABDOMINAL AORTOGRAM N/A 07/16/2017   Procedure: ABDOMINAL AORTOGRAM;  Surgeon: Serene Gaile ORN, MD;  Location: [facility];  Service: Cardiovascular;  Laterality: N/A;   ABDOMINAL AORTOGRAM W/LOWER EXTREMITY Bilateral 12/02/2018   Procedure: ABDOMINAL AORTOGRAM W/LOWER EXTREMITY;  Surgeon: Serene Gaile ORN, MD;  Location: [facility];  Service: Cardiovascular;  Laterality: Bilateral;   AORTIC ARCH ANGIOGRAPHY N/A 12/02/2018   Procedure: AORTIC ARCH ANGIOGRAPHY;  Surgeon: Serene Gaile ORN, MD;  Location: [facility];  Service: Cardiovascular;  Laterality: N/A;   basilaraneurysm repair  11/2020   CAROTID-SUBCLAVIAN BYPASS GRAFT Left 02/22/2021   Procedure: LEFT CAROTID-SUBCLAVIAN BYPASS USING HEAMSHIELD GOLD X30MM;  Surgeon: Serene Gaile ORN, MD;  Location: [facility] OR;  Service: Vascular;  Laterality: Left;   ENDOMETRIAL BIOPSY  2005   FEMORAL ARTERY  STENT Bilateral 2008   IR 3D INDEPENDENT WKST  12/02/2020   IR ANGIO INTRA EXTRACRAN SEL INTERNAL CAROTID UNI R MOD SED  11/08/2020   IR ANGIO VERTEBRAL SEL SUBCLAVIAN INNOMINATE UNI R MOD SED  11/08/2020   IR ANGIO VERTEBRAL SEL VERTEBRAL BILAT MOD SED  12/02/2020   IR ANGIO VERTEBRAL SEL VERTEBRAL UNI R MOD SED  06/16/2021   IR ANGIOGRAM FOLLOW UP STUDY  12/02/2020   IR ANGIOGRAM FOLLOW UP STUDY  12/02/2020   IR ANGIOGRAM FOLLOW UP STUDY  12/02/2020   IR ANGIOGRAM FOLLOW UP STUDY  12/02/2020   IR TRANSCATH/EMBOLIZ  12/02/2020   IR US  GUIDE VASC ACCESS RIGHT  11/08/2020   IR US  GUIDE VASC ACCESS RIGHT  12/02/2020   IR US  GUIDE VASC ACCESS RIGHT  06/16/2021   IR US  GUIDE VASC ACCESS RIGHT  06/16/2021   LOWER EXTREMITY ANGIOGRAPHY Bilateral 07/16/2017   Procedure: Lower Extremity Angiography;  Surgeon: Serene Gaile ORN, MD;  Location: [facility];  Service: Cardiovascular;  Laterality: Bilateral;   NASAL SINUS SURGERY     PERIPHERAL VASCULAR BALLOON ANGIOPLASTY Bilateral 07/16/2017   Procedure: PERIPHERAL VASCULAR BALLOON ANGIOPLASTY;  Surgeon: Serene Gaile ORN, MD;  Location: [facility];  Service: Cardiovascular;  Laterality: Bilateral;  iliacs   PERIPHERAL VASCULAR INTERVENTION Bilateral 12/02/2018   Procedure: PERIPHERAL VASCULAR INTERVENTION;  Surgeon: Serene Gaile ORN, MD;  Location: [facility];  Service: Cardiovascular;  Laterality: Bilateral;   RADIOLOGY WITH ANESTHESIA N/A 12/02/2020   Procedure: Arteriogram, Surpass embolization of basilar aneurysm;  Surgeon: Lanis Pupa, MD;  Location: [facility] OR;  Service: Radiology;  Laterality: N/A;   TONSILLECTOMY     TUBAL LIGATION     VASCULAR SURGERY     Patient Active Problem List   Diagnosis Date Noted   Left groin pain 07/19/2023   Lumbar degenerative disc disease 06/28/2023   Age-related osteoporosis without current pathological fracture 12/19/2022   OSA (obstructive sleep apnea) 08/29/2022   IFG (impaired  fasting glucose) 08/29/2022   History of basal cell cancer, nose 10/12/2021   Thoracic aortic ectasia (HCC) 08/17/2021   Left subclavian artery occlusion 02/22/2021   Aneurysm, cerebral, nonruptured 12/02/2020   Anxiety state 12/25/2017   Thyroid  nodule 04/23/2017   Emphysematous COPD (HCC) 04/23/2017   Neuropathy involving both lower extremities 04/22/2017   Pleuritic chest pain 04/22/2017   Essential hypertension 05/01/2016   Abnormal findings on esophagogastroduodenoscopy (EGD) 03/18/2016   BPPV (benign paroxysmal positional vertigo) 06/28/2015   Irritable bowel syndrome 04/01/2015   Dyslipidemia, goal LDL below 70 04/01/2015   Multinodular goiter 12/19/2011   Constipation 07/28/2011   Dyslipidemia 07/28/2011   Acid reflux 09/25/2006   Peripheral vascular disease (HCC) 09/25/2006    PCP: Alvan Dorothyann BIRCH, MD   REFERRING PROVIDER: Curtis Debby PARAS MD   REFERRING DIAG: M51.360 (ICD-10-CM) - Degeneration of intervertebral disc of lumbar region with discogenic back pain - Hip and back  Rationale for Evaluation and Treatment: Rehabilitation  THERAPY DIAG:  Pain in thoracic spine  Other low back pain  Muscle weakness (generalized)  ONSET DATE: 2024 after caring for her mom  SUBJECTIVE:                                                                                                                                                                                           SUBJECTIVE STATEMENT: Pt reports she is about the same  Evaluation: Patient reporting pain from bra line down and then laterally to R. Also having L hip pain in the front to where she can't hardly walk. Intermittent tingling down B legs.   PERTINENT HISTORY:  Osteoporosis , Brain aneurysm w/ surgery 10/22, CAD  PAIN:  Are you having pain? Yes: NPRS scale: 4/10 now up to 10/10 Pain location: bra line to low back and off to R>L Pain description: throbbing, pulling Aggravating factors:  twisting to the R, lifting, lying on left side Relieving factors: sitting straight  PRECAUTIONS: Other: OP  RED FLAGS: None   WEIGHT BEARING RESTRICTIONS: No  FALLS:  Has patient fallen  in last 6 months? 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  DIAGNOSTIC FINDINGS:  MRI 07/30/23  IMPRESSION: Facet degenerative disease at L5-S1 is worse on the left and results in trace anterolisthesis L5 on S1. The central canal and foramina are widely patent at all levels with minimal disc bulging at L5-S1 noted.  Lumbar XR IMPRESSION: Mild degenerative joint changes at L5-S1.  PATIENT SURVEYS:  Modified Oswestry 30 / 50 = 60.0 %   COGNITION: Overall cognitive status: Within functional limits for tasks assessed     SENSATION: Not tested  MUSCLE LENGTH: B HS tightness R>L and R psoas tightness  POSTURE: rounded shoulders, forward head, decreased lumbar lordosis, increased thoracic kyphosis, flexed trunk   PALPATION: Tightness in B thoracic paraspinals  LUMBAR ROM: WFL - Left SB pulling on R, left rotation caused cramping L bra line   LOWER EXTREMITY ROM:     LOWER EXTREMITY MMT:  core weakness with hip flexor testing  MMT Right eval Left eval  Hip flexion 4 5  Hip extension 5 5  Hip abduction 5 5  Hip adduction 5 5  Hip internal rotation    Hip external rotation    Knee flexion    Knee extension    Ankle dorsiflexion    Ankle plantarflexion    Ankle inversion    Ankle eversion     (Blank  rows = not tested)  FUNCTIONAL TESTS:  5 times sit to stand: 17.74   [facility] Adult PT Treatment:                                                DATE: 08/28/23 Therapeutic Exercise/Activity: Pelvic tilts on dyna disc cuing to sperate pelvis from spine Pelvic cirlces on dynadisc TA x 5 sec hold x 5 TA + BKFO TA + march LTR in tolerable range x 10 Doorway QL stretch Quadruped tail wags Suitcase carry 10# KB Pallof press red TB Manual Therapy: STM Rt QL  Self Care: Discussion of sleeping positions for home  [facility] Adult PT Treatment:                                                DATE: 08/22/23 Therapeutic Exercise: Lumbar mobility in standing against noodle - pt unable to flatten spine Seated lumbar flex/ext on dyna disc to work on isolating lumbar spine from throacic - requires PT TC to chest to avoid thoracic kyphosis Hang from matrix for lat stretch Low trap lift off x 4 facing wall Snow angel at wall - very limited Back to wall in goal post position - active retraction to wall x 10 NMRE Hooklying PPT x 10 TA contraction 5 sec hold x 10 TA + BKFO  x 10 ea TA + leg extension x 10  TA + sequential march x 10 B. Supine Green band B shoulder ext with PT as anchor and alt march Prone hip ext 2x 10 B (added pillow under waist)   [facility] Adult PT Treatment:                                                DATE: 08/13/23 Therapeutic Exercise: Cat/Cow x 10 Quadruped tail wags x 10 Seated lumbar flex/ext  Hip flexor stretch sitting with OH reach with SB 2 x 30 sec B NMRE Hooklying PPT x 10 TA contraction 5 sec hold x 10 TA + BKFO x 10 ea TA + sequential march x 5 B Prone hip ext x 10 B (added pillow under waist) Self Care: Explanation of facet joints and need to mobilize  [facility] Adult PT Treatment:                                                DATE: 08/07/23 Therapeutic Exercise/Activity: Standing lumbar AROM with some increased Rt side pain with lateral flexion, no pain with  flexion/extension Hip flexor stretch standing 2 x 20 sec Hip flexor stretch sitting  2 x 20 sec LTR x 2 min in tolerable range Posterior pelvic tilt 10 x 3 sec hold with tactile cues Posterior tilt with bent knee fall out x 10 with tactile cues Physioball roll out laterally Manual Therapy: STM lumbar and thoracic paraspinals, lats    PATIENT EDUCATION:  Education details: PT eval findings, anticipated POC, initial HEP, postural awareness, posture and body mechanics for typical daily postioning, mobility and household tasks, and Osteoporosis education  Person educated: Patient Education method: Explanation, Demonstration, and Handouts Education comprehension: verbalized understanding and returned demonstration  HOME EXERCISE PROGRAM:  Access Code: [email or web address] URL: [email or web address] Date: 08/28/2023 Prepared by: Darice Conine  Exercises - Supine Lower Trunk Rotation  - 1 x daily - 7 x weekly - 3 sets - 10 reps - Seated Hip Flexor Stretch  - 1 x daily - 7 x weekly - 1 sets - 3 reps - 30 seconds hold - Hooklying Sequential Leg March and Lower  - 1 x daily - 3 x weekly - 2 sets - 10 reps - Cat Cow  - 1 x daily - 7 x weekly - 2 sets - 10 reps - Standing Scapular Retraction in Abduction  - 1 x daily - 7 x weekly - 2 sets - 10 reps - 2-3 sec hold - Seated Pelvic Tilt  - 1 x daily - 7 x weekly - 2 sets - 10 reps - Standing Anti-Rotation Press with Anchored Resistance  - 1 x daily - 7 x weekly - 3 sets - 10 reps - Standing Quadratus Lumborum Stretch with Doorway  - 1 x daily - 7 x weekly - 1 sets - 10 reps - 10 seconds hold    ASSESSMENT:  CLINICAL IMPRESSION: Continued to progress mobility with stretching and manual work. Progressed core strength with pallof press and suitcase carry. Pt with good tolerance to all activities during session. Good response to manual work. HEP updated  Evaluation: Patient is a 63 y.o. female who was seen today for physical therapy  evaluation and treatment for mid and low back and left hip pain. She has a history of Osteoporosis and was seen in April of this year in this clinic, but discharged due to her high pain levels and lack of tolerance to PT. She has pain and spasm with lumbar rotation and tightness in B thoracic paraspinals. She demonstrates rounded shoulders and increased thoracic kyphosis as well as decreased knowledge about body mechanics especially as it relates to osteoporosis. Her modified oswestry score is 30 / 50 = 60.0 %. She was able to complete the full evaluation with no significant change in her pain levels and should be able to tolerate PT this episode. She is getting an MRI this afternoon .She will benefit from skilled PT to address these deficits.       GOALS: Goals reviewed with patient? Yes  SHORT TERM GOALS: Target date: 08/27/2023   Patient will be independent with initial HEP.  Baseline:  Goal status: MET  2.  Patient will report decreased pain by 30% with walking.  Baseline:  Goal status: IN PROGRESS   LONG TERM GOALS: Target date: 09/24/2023   Patient will be independent with advanced/ongoing HEP to improve outcomes and carryover; specifically weightbearing exercises to address her OP.  Baseline:  Goal status: INITIAL  2.  Patient will report 75%% improvement in low back and L hip pain with ADLs to improve QOL.  Baseline:  Goal status: INITIAL  3.  Patient will demonstrate correct body mechanics for lifting and squatting to avoid further injury and allow her to care for her grandson.   Baseline:  Goal status: INITIAL  4.  Patient will demonstrate improved 5XSTS by 2-3 seconds demonstrating improved LE functional strength. Baseline: 17.74 sec Goal status: INITIAL  5.  Patient will score 24 on the Modified Oswestry demonstrating improved functional ability.  Baseline: 30 Goal status: INITIAL   PLAN:  PT FREQUENCY: 2x/week  PT DURATION: 8 weeks  PLANNED INTERVENTIONS:  97164- PT Re-evaluation, 97110-Therapeutic exercises, 97530- Therapeutic activity, 97112- Neuromuscular re-education, 97535- Self Care, 02859- Manual therapy, [phone]- Aquatic Therapy, [phone]- Electrical stimulation (unattended), [phone] (1-2 muscles), 20561 (3+ muscles)- Dry Needling, Patient/Family education, Joint mobilization, Spinal mobilization, Cryotherapy, and Moist heat.  PLAN FOR NEXT SESSION: assess response to HEP, work on lumbar mobiliyt,, focus on resistance training and weightbearing exercises for OP, functional LE strength, postural strength, body mechanics and ADL modifications    Darice Conine, PT,DPT07/02/252:47 PM

## 2023-08-29 ENCOUNTER — Ambulatory Visit (INDEPENDENT_AMBULATORY_CARE_PROVIDER_SITE_OTHER): Admitting: Sports Medicine

## 2023-08-29 DIAGNOSIS — M5136 Other intervertebral disc degeneration, lumbar region with discogenic back pain only: Secondary | ICD-10-CM

## 2023-08-29 MED ORDER — PREGABALIN 75 MG PO CAPS: 75.0000 mg | ORAL_CAPSULE | Freq: Three times a day (TID) | ORAL | 1 refills | Status: DC

## 2023-08-29 NOTE — Assessment & Plan Note (Signed)
 Tina Navarro returns, she is a very pleasant 63 year old female, she has chronic right-sided axial low back pain, she has improved with PT and Lyrica , there was some confusion as to her exact dosing regimen. Luckily she is here in the office today and we were able to clarify, she currently is doing a 75 mg capsule twice a day with an occasional 25 mg capsule added. She would like to go up to 75 mg 3 times daily and supplement with a 25 mg up to 3 times a day but only as needed, I think this is acceptable. We will refill the 75's and she does have a lot of 25 left so we do not need to fill those again. At this point she does not need any interventional treatment.

## 2023-08-29 NOTE — Progress Notes (Signed)
    Procedures performed today:    None.  Independent interpretation of notes and tests performed by another provider:   None.  Brief History, Exam, Impression, and Recommendations:    Lumbar degenerative disc disease Rheya returns, she is a very pleasant 63 year old female, she has chronic right-sided axial low back pain, she has improved with PT and Lyrica , there was some confusion as to her exact dosing regimen. Luckily she is here in the office today and we were able to clarify, she currently is doing a 75 mg capsule twice a day with an occasional 25 mg capsule added. She would like to go up to 75 mg 3 times daily and supplement with a 25 mg up to 3 times a day but only as needed, I think this is acceptable. We will refill the 75's and she does have a lot of 25 left so we do not need to fill those again. At this point she does not need any interventional treatment.    ____________________________________________ Debby PARAS. Curtis, M.D., ABFM., CAQSM., AME. Primary Care and Sports Medicine [facility]  Adjunct Professor of [facility] Medicine  [employer]  Restaurant manager, fast food

## 2023-09-05 ENCOUNTER — Encounter: Payer: Self-pay | Admitting: Family Medicine

## 2023-09-05 ENCOUNTER — Ambulatory Visit (INDEPENDENT_AMBULATORY_CARE_PROVIDER_SITE_OTHER): Admitting: Family Medicine

## 2023-09-05 VITALS — BP 125/67 | HR 96 | Ht 65.0 in | Wt 176.1 lb

## 2023-09-05 DIAGNOSIS — G4733 Obstructive sleep apnea (adult) (pediatric): Secondary | ICD-10-CM | POA: Diagnosis not present

## 2023-09-05 DIAGNOSIS — G5793 Unspecified mononeuropathy of bilateral lower limbs: Secondary | ICD-10-CM

## 2023-09-05 MED ORDER — AMBULATORY NON FORMULARY MEDICATION: 1 refills | Status: AC

## 2023-09-05 NOTE — Assessment & Plan Note (Addendum)
 Reviewed sleep study results together.  Discussed treatment options.  We reviewed the results together.  AHI of 7 consistent with mild sleep apnea.  We discussed the option of weight management, avoiding sleeping on her back, avoiding sedatives and monitoring versus restarting a trial of CPAP set to AutoPap and then we would set afterwards.  Because of the fatigue issues she is open to retrying CPAP to see if it is helpful we also discussed the possibility of an oral appliance.

## 2023-09-05 NOTE — Assessment & Plan Note (Signed)
 Another Lyrica  was started initially for her back she is getting some neuropathy symptom relief.  We did discuss tapering back down on the Lyrica  may be trying 50 or even 25 or considering a lower dose in the morning and a little higher dose in the late day.  Especially if it is causing significant shift in her mood but she is getting some relief so it might be that she can find a lower therapeutic dose.  The other option will be to come completely off and consider switching to Cymbalta.

## 2023-09-05 NOTE — Progress Notes (Signed)
 Established Patient Office Visit  Subjective  Patient ID: Tina Navarro, female    DOB: Jan 21, 1961  Age: 63 y.o. MRN: [id number]  Chief Complaint  Patient presents with   Results    HPI  Hear  today to go over recent sleep study results. HST performed 08/27/23.  She was dx with moderate sleep apnea years ago.  At that time she was actually waking herself up with snoring.  She says since then her snoring has not been as loud per her husband.  But she still having a lot of fatigue issues so more winded simply wanted to circle back to see whether or not she had persistent sleep apnea symptoms.  She also wanted to discuss her Lyrica  she was placed on this initially for her back and side pain.  She noticed a significant emotional shift on the medication even starting at a low dose of 25 mg she feels like it makes her feel down and sad really not wanting to get out of bed and feel motivated.  She has gradually worked up to 75 mg twice a day but again it has not really been super helpful for her side pain but she has noticed that the discomfort she been having in her feet improved greatly.  Though the foot pain was not debilitating.    ROS    Objective:     BP 125/67   Pulse 96   Ht 5' 5 (1.651 m)   Wt 176 lb 1.9 oz (79.9 kg)   LMP  (LMP Unknown)   SpO2 96%   BMI 29.31 kg/m    Physical Exam Vitals reviewed.  Constitutional:      Appearance: Normal appearance.  HENT:     Head: Normocephalic.  Pulmonary:     Effort: Pulmonary effort is normal.  Neurological:     Mental Status: She is alert and oriented to person, place, and time.  Psychiatric:        Mood and Affect: Mood normal.        Behavior: Behavior normal.      No results found for any visits on 09/05/23.    The ASCVD Risk score (Arnett DK, et al., 2019) failed to calculate for the following reasons:   The valid total cholesterol range is 130 to 320 mg/dL    Assessment & Plan:   Problem List Items  Addressed This Visit       Respiratory   OSA (obstructive sleep apnea) - Primary   Reviewed sleep study results together.  Discussed treatment options.  We reviewed the results together.  AHI of 7 consistent with mild sleep apnea.  We discussed the option of weight management, avoiding sleeping on her back, avoiding sedatives and monitoring versus restarting a trial of CPAP set to AutoPap and then we would set afterwards.  Because of the fatigue issues she is open to retrying CPAP to see if it is helpful we also discussed the possibility of an oral appliance.      Relevant Medications   AMBULATORY NON FORMULARY MEDICATION     Nervous and Auditory   Neuropathy involving both lower extremities   Another Lyrica  was started initially for her back she is getting some neuropathy symptom relief.  We did discuss tapering back down on the Lyrica  may be trying 50 or even 25 or considering a lower dose in the morning and a little higher dose in the late day.  Especially if it is causing significant shift in her  mood but she is getting some relief so it might be that she can find a lower therapeutic dose.  The other option will be to come completely off and consider switching to Cymbalta.      He would prefer for her supplies to be referred to a place where she could go in person if needed.  She does remember having some significant difficulty finding the correct mask when she was using CPAP years ago.  Did give her a copy of the sleep study report.  No follow-ups on file.    Dorothyann Byars, MD

## 2023-09-09 DIAGNOSIS — H35363 Drusen (degenerative) of macula, bilateral: Secondary | ICD-10-CM | POA: Diagnosis not present

## 2023-09-09 DIAGNOSIS — H524 Presbyopia: Secondary | ICD-10-CM | POA: Diagnosis not present

## 2023-09-09 DIAGNOSIS — H35033 Hypertensive retinopathy, bilateral: Secondary | ICD-10-CM | POA: Diagnosis not present

## 2023-09-10 ENCOUNTER — Ambulatory Visit: Admitting: Physical Therapy

## 2023-09-10 ENCOUNTER — Encounter: Payer: Self-pay | Admitting: Physical Therapy

## 2023-09-10 DIAGNOSIS — M546 Pain in thoracic spine: Secondary | ICD-10-CM

## 2023-09-10 DIAGNOSIS — M549 Dorsalgia, unspecified: Secondary | ICD-10-CM | POA: Diagnosis not present

## 2023-09-10 DIAGNOSIS — M5459 Other low back pain: Secondary | ICD-10-CM | POA: Diagnosis not present

## 2023-09-10 DIAGNOSIS — M6281 Muscle weakness (generalized): Secondary | ICD-10-CM

## 2023-09-10 DIAGNOSIS — R29898 Other symptoms and signs involving the musculoskeletal system: Secondary | ICD-10-CM | POA: Diagnosis not present

## 2023-09-10 DIAGNOSIS — M545 Low back pain, unspecified: Secondary | ICD-10-CM | POA: Diagnosis not present

## 2023-09-10 NOTE — Therapy (Signed)
 OUTPATIENT PHYSICAL THERAPY THORACOLUMBAR TREATMENT   Patient Name: Tina Navarro MRN: [id number] DOB:May 15, 1960, 63 y.o., female Today's Date: 09/10/2023  END OF SESSION:  PT End of Session - 09/10/23 1229     Visit Number 7    Number of Visits 16    Date for PT Re-Evaluation 09/24/23    Authorization Type [employer]BS federal    Authorization - Visit Number 7    Progress Note Due on Visit 10    PT Start Time 1145    PT Stop Time 1225    PT Time Calculation (min) 40 min    Activity Tolerance Patient tolerated treatment well    Behavior During Therapy [facility] for tasks assessed/performed               Past Medical History:  Diagnosis Date   Anxiety    Basilar artery aneurysm (HCC)    s/p stent supported coil embolization of basilar aneurysm 12/02/20   COVID-19 10/16/2022   Dyspnea    Emphysematous COPD (HCC) 04/23/2017   GERD (gastroesophageal reflux disease)    Hyperlipidemia    Hypertension    Peripheral arterial disease (HCC)    Thyroid  nodule 04/23/2017   Past Surgical History:  Procedure Laterality Date   ABDOMINAL AORTOGRAM N/A 07/16/2017   Procedure: ABDOMINAL AORTOGRAM;  Surgeon: Serene Gaile ORN, MD;  Location: [facility];  Service: Cardiovascular;  Laterality: N/A;   ABDOMINAL AORTOGRAM W/LOWER EXTREMITY Bilateral 12/02/2018   Procedure: ABDOMINAL AORTOGRAM W/LOWER EXTREMITY;  Surgeon: Serene Gaile ORN, MD;  Location: [facility];  Service: Cardiovascular;  Laterality: Bilateral;   AORTIC ARCH ANGIOGRAPHY N/A 12/02/2018   Procedure: AORTIC ARCH ANGIOGRAPHY;  Surgeon: Serene Gaile ORN, MD;  Location: [facility];  Service: Cardiovascular;  Laterality: N/A;   basilaraneurysm repair  11/2020   CAROTID-SUBCLAVIAN BYPASS GRAFT Left 02/22/2021   Procedure: LEFT CAROTID-SUBCLAVIAN BYPASS USING HEAMSHIELD GOLD X30MM;  Surgeon: Serene Gaile ORN, MD;  Location: [facility] OR;  Service: Vascular;  Laterality: Left;   ENDOMETRIAL BIOPSY  2005   FEMORAL  ARTERY STENT Bilateral 2008   IR 3D INDEPENDENT WKST  12/02/2020   IR ANGIO INTRA EXTRACRAN SEL INTERNAL CAROTID UNI R MOD SED  11/08/2020   IR ANGIO VERTEBRAL SEL SUBCLAVIAN INNOMINATE UNI R MOD SED  11/08/2020   IR ANGIO VERTEBRAL SEL VERTEBRAL BILAT MOD SED  12/02/2020   IR ANGIO VERTEBRAL SEL VERTEBRAL UNI R MOD SED  06/16/2021   IR ANGIOGRAM FOLLOW UP STUDY  12/02/2020   IR ANGIOGRAM FOLLOW UP STUDY  12/02/2020   IR ANGIOGRAM FOLLOW UP STUDY  12/02/2020   IR ANGIOGRAM FOLLOW UP STUDY  12/02/2020   IR TRANSCATH/EMBOLIZ  12/02/2020   IR US  GUIDE VASC ACCESS RIGHT  11/08/2020   IR US  GUIDE VASC ACCESS RIGHT  12/02/2020   IR US  GUIDE VASC ACCESS RIGHT  06/16/2021   IR US  GUIDE VASC ACCESS RIGHT  06/16/2021   LOWER EXTREMITY ANGIOGRAPHY Bilateral 07/16/2017   Procedure: Lower Extremity Angiography;  Surgeon: Serene Gaile ORN, MD;  Location: [facility];  Service: Cardiovascular;  Laterality: Bilateral;   NASAL SINUS SURGERY     PERIPHERAL VASCULAR BALLOON ANGIOPLASTY Bilateral 07/16/2017   Procedure: PERIPHERAL VASCULAR BALLOON ANGIOPLASTY;  Surgeon: Serene Gaile ORN, MD;  Location: [facility];  Service: Cardiovascular;  Laterality: Bilateral;  iliacs   PERIPHERAL VASCULAR INTERVENTION Bilateral 12/02/2018   Procedure: PERIPHERAL VASCULAR INTERVENTION;  Surgeon: Serene Gaile ORN, MD;  Location: [facility];  Service: Cardiovascular;  Laterality: Bilateral;   RADIOLOGY WITH ANESTHESIA N/A 12/02/2020   Procedure: Arteriogram, Surpass embolization of basilar aneurysm;  Surgeon: Lanis Pupa, MD;  Location: [facility] OR;  Service: Radiology;  Laterality: N/A;   TONSILLECTOMY     TUBAL LIGATION     VASCULAR SURGERY     Patient Active Problem List   Diagnosis Date Noted   Left groin pain 07/19/2023   Lumbar degenerative disc disease 06/28/2023   Age-related osteoporosis without current pathological fracture 12/19/2022   OSA (obstructive sleep apnea) 08/29/2022   IFG  (impaired fasting glucose) 08/29/2022   History of basal cell cancer, nose 10/12/2021   Thoracic aortic ectasia (HCC) 08/17/2021   Left subclavian artery occlusion 02/22/2021   Aneurysm, cerebral, nonruptured 12/02/2020   Anxiety state 12/25/2017   Thyroid  nodule 04/23/2017   Emphysematous COPD (HCC) 04/23/2017   Neuropathy involving both lower extremities 04/22/2017   Pleuritic chest pain 04/22/2017   Essential hypertension 05/01/2016   Abnormal findings on esophagogastroduodenoscopy (EGD) 03/18/2016   BPPV (benign paroxysmal positional vertigo) 06/28/2015   Irritable bowel syndrome 04/01/2015   Dyslipidemia, goal LDL below 70 04/01/2015   Multinodular goiter 12/19/2011   Constipation 07/28/2011   Dyslipidemia 07/28/2011   Acid reflux 09/25/2006   Peripheral vascular disease (HCC) 09/25/2006    PCP: Alvan Dorothyann BIRCH, MD   REFERRING PROVIDER: Curtis Debby PARAS MD   REFERRING DIAG: M51.360 (ICD-10-CM) - Degeneration of intervertebral disc of lumbar region with discogenic back pain - Hip and back  Rationale for Evaluation and Treatment: Rehabilitation  THERAPY DIAG:  Pain in thoracic spine  Other low back pain  Muscle weakness (generalized)  ONSET DATE: 2024 after caring for her mom  SUBJECTIVE:                                                                                                                                                                                           SUBJECTIVE STATEMENT: Pt states that sleeping with support under her ribs has greatly decreased pain. She is feeling pretty good  Evaluation: Patient reporting pain from bra line down and then laterally to R. Also having L hip pain in the front to where she can't hardly walk. Intermittent tingling down B legs.   PERTINENT HISTORY:  Osteoporosis , Brain aneurysm w/ surgery 10/22, CAD  PAIN:  Are you having pain? Yes: NPRS scale: 2/10 now up to 10/10 Pain location: bra line to low  back and off to R>L Pain description: throbbing, pulling Aggravating factors: twisting to the R, lifting, lying on left side Relieving factors: sitting straight  PRECAUTIONS: Other: OP  RED FLAGS:  None   WEIGHT BEARING RESTRICTIONS: No  FALLS:  Has patient fallen in last 6 months? 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  DIAGNOSTIC FINDINGS:  MRI 07/30/23  IMPRESSION: Facet degenerative disease at L5-S1 is worse on the left and results in trace anterolisthesis L5 on S1. The central canal and foramina are widely patent at all levels with minimal disc bulging at L5-S1 noted.  Lumbar XR IMPRESSION: Mild degenerative joint changes at L5-S1.  PATIENT SURVEYS:  Modified Oswestry 30 / 50 = 60.0 %   COGNITION: Overall cognitive status: Within functional limits for tasks assessed     SENSATION: Not tested  MUSCLE LENGTH: B HS tightness R>L and R psoas tightness  POSTURE: rounded shoulders, forward head, decreased lumbar lordosis, increased thoracic kyphosis, flexed trunk   PALPATION: Tightness in B thoracic paraspinals  LUMBAR ROM: WFL - Left SB pulling on R, left rotation caused cramping L bra line   LOWER EXTREMITY ROM:     LOWER EXTREMITY MMT:  core weakness with hip flexor testing  MMT Right eval Left eval  Hip flexion 4 5  Hip extension 5 5  Hip abduction 5 5  Hip adduction 5 5  Hip internal rotation    Hip external rotation    Knee flexion    Knee extension    Ankle  dorsiflexion    Ankle plantarflexion    Ankle inversion    Ankle eversion     (Blank rows = not tested)  FUNCTIONAL TESTS:  5 times sit to stand: 17.74   [facility] Adult PT Treatment:                                                DATE: 09/10/23 Therapeutic Exercise/Activity: Seated on dyna disc: pelvic tilts, pelvic circles ab isometric table top position Oblique isometric table top position Quadruped tail wags Attempted quadruped hip ext - limited by groin pain on Rt QL doorway stretch Sit to stand to overhead press 7# in 1 UE 2 x 5 bilat Pallof press red TB x 10 bilat Green TB pull down with march   [facility] Adult PT Treatment:                                                DATE: 08/28/23 Therapeutic Exercise/Activity: Pelvic tilts on dyna disc cuing to sperate pelvis from spine Pelvic cirlces on dynadisc TA x 5 sec hold x 5 TA + BKFO TA + march LTR in tolerable range x 10 Doorway QL stretch Quadruped tail wags Suitcase carry 10# KB Pallof press red TB Manual Therapy: STM Rt QL  Self Care: Discussion of sleeping positions for home  [facility] Adult PT Treatment:  DATE: 08/22/23 Therapeutic Exercise: Lumbar mobility in standing against noodle - pt unable to flatten spine Seated lumbar flex/ext on dyna disc to work on isolating lumbar spine from throacic - requires PT TC to chest to avoid thoracic kyphosis Hang from matrix for lat stretch Low trap lift off x 4 facing wall Snow angel at wall - very limited Back to wall in goal post position - active retraction to wall x 10 NMRE Hooklying PPT x 10 TA contraction 5 sec hold x 10 TA + BKFO x 10 ea TA + leg extension x 10  TA + sequential march x 10 B. Supine Green band B shoulder ext with PT as anchor and alt march Prone hip ext 2x 10 B (added pillow under waist)   [facility] Adult PT Treatment:                                                DATE: 08/13/23 Therapeutic Exercise: Cat/Cow x  10 Quadruped tail wags x 10 Seated lumbar flex/ext  Hip flexor stretch sitting with OH reach with SB 2 x 30 sec B NMRE Hooklying PPT x 10 TA contraction 5 sec hold x 10 TA + BKFO x 10 ea TA + sequential march x 5 B Prone hip ext x 10 B (added pillow under waist) Self Care: Explanation of facet joints and need to mobilize      PATIENT EDUCATION:  Education details: PT eval findings, anticipated POC, initial HEP, postural awareness, posture and body mechanics for typical daily postioning, mobility and household tasks, and Osteoporosis education  Person educated: Patient Education method: Explanation, Demonstration, and Handouts Education comprehension: verbalized understanding and returned demonstration  HOME EXERCISE PROGRAM:  Access Code: [email or web address] URL: [email or web address] Date: 08/28/2023 Prepared by: Darice Conine  Exercises - Supine Lower Trunk Rotation  - 1 x daily - 7 x weekly - 3 sets - 10 reps - Seated Hip Flexor Stretch  - 1 x daily - 7 x weekly - 1 sets - 3 reps - 30 seconds hold - Hooklying Sequential Leg March and Lower  - 1 x daily - 3 x weekly - 2 sets - 10 reps - Cat Cow  - 1 x daily - 7 x weekly - 2 sets - 10 reps - Standing Scapular Retraction in Abduction  - 1 x daily - 7 x weekly - 2 sets - 10 reps - 2-3 sec hold - Seated Pelvic Tilt  - 1 x daily - 7 x weekly - 2 sets - 10 reps - Standing Anti-Rotation Press with Anchored Resistance  - 1 x daily - 7 x weekly - 3 sets - 10 reps - Standing Quadratus Lumborum Stretch with Doorway  - 1 x daily - 7 x weekly - 1 sets - 10 reps - 10 seconds hold    ASSESSMENT:  CLINICAL IMPRESSION: Pt continues to progress well. Her mobility is improving. She will benefit from continued core strengthening and endurance to improve functional activity tolerance  Evaluation: Patient is a 63 y.o. female who was seen today for physical therapy evaluation and treatment for mid and low back and left hip pain. She  has a history of Osteoporosis and was seen in April of this year in this clinic, but discharged due to her high pain levels and lack of tolerance to PT. She has pain and spasm with lumbar  rotation and tightness in B thoracic paraspinals. She demonstrates rounded shoulders and increased thoracic kyphosis as well as decreased knowledge about body mechanics especially as it relates to osteoporosis. Her modified oswestry score is 30 / 50 = 60.0 %. She was able to complete the full evaluation with no significant change in her pain levels and should be able to tolerate PT this episode. She is getting an MRI this afternoon .She will benefit from skilled PT to address these deficits.       GOALS: Goals reviewed with patient? Yes  SHORT TERM GOALS: Target date: 08/27/2023   Patient will be independent with initial HEP.  Baseline:  Goal status: MET  2.  Patient will report decreased pain by 30% with walking.  Baseline:  Goal status: IN PROGRESS   LONG TERM GOALS: Target date: 09/24/2023   Patient will be independent with advanced/ongoing HEP to improve outcomes and carryover; specifically weightbearing exercises to address her OP.  Baseline:  Goal status: INITIAL  2.  Patient will report 75%% improvement in low back and L hip pain with ADLs to improve QOL.  Baseline:  Goal status: INITIAL  3.  Patient will demonstrate correct body mechanics for lifting and squatting to avoid further injury and allow her to care for her grandson.   Baseline:  Goal status: INITIAL  4.  Patient will demonstrate improved 5XSTS by 2-3 seconds demonstrating improved LE functional strength. Baseline: 17.74 sec Goal status: INITIAL  5.  Patient will score 24 on the Modified Oswestry demonstrating improved functional ability.  Baseline: 30 Goal status: INITIAL   PLAN:  PT FREQUENCY: 2x/week  PT DURATION: 8 weeks  PLANNED INTERVENTIONS: 97164- PT Re-evaluation, 97110-Therapeutic exercises, 97530- Therapeutic  activity, 97112- Neuromuscular re-education, 97535- Self Care, [id number]- Manual therapy, [phone]- Aquatic Therapy, [phone]- Electrical stimulation (unattended), [phone] (1-2 muscles), [phone] (3+ muscles)- Dry Needling, Patient/Family education, Joint mobilization, Spinal mobilization, Cryotherapy, and Moist heat.  PLAN FOR NEXT SESSION: assess response to HEP, work on lumbar mobiliyt,, focus on resistance training and weightbearing exercises for OP, functional LE strength, postural strength, body mechanics and ADL modifications    Darice Conine, PT,DPT07/15/2512:30 PM

## 2023-09-12 ENCOUNTER — Ambulatory Visit

## 2023-09-12 DIAGNOSIS — M549 Dorsalgia, unspecified: Secondary | ICD-10-CM | POA: Diagnosis not present

## 2023-09-12 DIAGNOSIS — M5459 Other low back pain: Secondary | ICD-10-CM | POA: Diagnosis not present

## 2023-09-12 DIAGNOSIS — M545 Low back pain, unspecified: Secondary | ICD-10-CM | POA: Diagnosis not present

## 2023-09-12 DIAGNOSIS — M6281 Muscle weakness (generalized): Secondary | ICD-10-CM

## 2023-09-12 DIAGNOSIS — M546 Pain in thoracic spine: Secondary | ICD-10-CM | POA: Diagnosis not present

## 2023-09-12 DIAGNOSIS — R29898 Other symptoms and signs involving the musculoskeletal system: Secondary | ICD-10-CM | POA: Diagnosis not present

## 2023-09-12 NOTE — Therapy (Signed)
 OUTPATIENT PHYSICAL THERAPY THORACOLUMBAR TREATMENT   Patient Name: Tina Navarro MRN: [id number] DOB:10-31-1960, 63 y.o., female Today's Date: 09/12/2023  END OF SESSION:  PT End of Session - 09/12/23 1026     Visit Number 8    Number of Visits 16    Date for PT Re-Evaluation 09/24/23    Authorization Type [employer] federal    Authorization - Visit Number 8    Progress Note Due on Visit 10    PT Start Time 1028   late check-in for patient   PT Stop Time 1108    PT Time Calculation (min) 40 min         Past Medical History:  Diagnosis Date   Anxiety    Basilar artery aneurysm (HCC)    s/p stent supported coil embolization of basilar aneurysm 12/02/20   COVID-19 10/16/2022   Dyspnea    Emphysematous COPD (HCC) 04/23/2017   GERD (gastroesophageal reflux disease)    Hyperlipidemia    Hypertension    Peripheral arterial disease (HCC)    Thyroid  nodule 04/23/2017   Past Surgical History:  Procedure Laterality Date   ABDOMINAL AORTOGRAM N/A 07/16/2017   Procedure: ABDOMINAL AORTOGRAM;  Surgeon: Serene Gaile ORN, MD;  Location: [facility];  Service: Cardiovascular;  Laterality: N/A;   ABDOMINAL AORTOGRAM W/LOWER EXTREMITY Bilateral 12/02/2018   Procedure: ABDOMINAL AORTOGRAM W/LOWER EXTREMITY;  Surgeon: Serene Gaile ORN, MD;  Location: [facility];  Service: Cardiovascular;  Laterality: Bilateral;   AORTIC ARCH ANGIOGRAPHY N/A 12/02/2018   Procedure: AORTIC ARCH ANGIOGRAPHY;  Surgeon: Serene Gaile ORN, MD;  Location: [facility];  Service: Cardiovascular;  Laterality: N/A;   basilaraneurysm repair  11/2020   CAROTID-SUBCLAVIAN BYPASS GRAFT Left 02/22/2021   Procedure: LEFT CAROTID-SUBCLAVIAN BYPASS USING HEAMSHIELD GOLD X30MM;  Surgeon: Serene Gaile ORN, MD;  Location: [facility] OR;  Service: Vascular;  Laterality: Left;   ENDOMETRIAL BIOPSY  2005   FEMORAL ARTERY STENT Bilateral 2008   IR 3D INDEPENDENT WKST  12/02/2020   IR ANGIO INTRA EXTRACRAN SEL  INTERNAL CAROTID UNI R MOD SED  11/08/2020   IR ANGIO VERTEBRAL SEL SUBCLAVIAN INNOMINATE UNI R MOD SED  11/08/2020   IR ANGIO VERTEBRAL SEL VERTEBRAL BILAT MOD SED  12/02/2020   IR ANGIO VERTEBRAL SEL VERTEBRAL UNI R MOD SED  06/16/2021   IR ANGIOGRAM FOLLOW UP STUDY  12/02/2020   IR ANGIOGRAM FOLLOW UP STUDY  12/02/2020   IR ANGIOGRAM FOLLOW UP STUDY  12/02/2020   IR ANGIOGRAM FOLLOW UP STUDY  12/02/2020   IR TRANSCATH/EMBOLIZ  12/02/2020   IR US  GUIDE VASC ACCESS RIGHT  11/08/2020   IR US  GUIDE VASC ACCESS RIGHT  12/02/2020   IR US  GUIDE VASC ACCESS RIGHT  06/16/2021   IR US  GUIDE VASC ACCESS RIGHT  06/16/2021   LOWER EXTREMITY ANGIOGRAPHY Bilateral 07/16/2017   Procedure: Lower Extremity Angiography;  Surgeon: Serene Gaile ORN, MD;  Location: [facility];  Service: Cardiovascular;  Laterality: Bilateral;   NASAL SINUS SURGERY     PERIPHERAL VASCULAR BALLOON ANGIOPLASTY Bilateral 07/16/2017   Procedure: PERIPHERAL VASCULAR BALLOON ANGIOPLASTY;  Surgeon: Serene Gaile ORN, MD;  Location: [facility];  Service: Cardiovascular;  Laterality: Bilateral;  iliacs   PERIPHERAL VASCULAR INTERVENTION Bilateral 12/02/2018   Procedure: PERIPHERAL VASCULAR INTERVENTION;  Surgeon: Serene Gaile ORN, MD;  Location: [facility];  Service: Cardiovascular;  Laterality: Bilateral;   RADIOLOGY WITH ANESTHESIA N/A 12/02/2020   Procedure: Arteriogram, Surpass embolization  of basilar aneurysm;  Surgeon: Lanis Pupa, MD;  Location: [facility] OR;  Service: Radiology;  Laterality: N/A;   TONSILLECTOMY     TUBAL LIGATION     VASCULAR SURGERY     Patient Active Problem List   Diagnosis Date Noted   Left groin pain 07/19/2023   Lumbar degenerative disc disease 06/28/2023   Age-related osteoporosis without current pathological fracture 12/19/2022   OSA (obstructive sleep apnea) 08/29/2022   IFG (impaired fasting glucose) 08/29/2022   History of basal cell cancer, nose 10/12/2021   Thoracic  aortic ectasia (HCC) 08/17/2021   Left subclavian artery occlusion 02/22/2021   Aneurysm, cerebral, nonruptured 12/02/2020   Anxiety state 12/25/2017   Thyroid  nodule 04/23/2017   Emphysematous COPD (HCC) 04/23/2017   Neuropathy involving both lower extremities 04/22/2017   Pleuritic chest pain 04/22/2017   Essential hypertension 05/01/2016   Abnormal findings on esophagogastroduodenoscopy (EGD) 03/18/2016   BPPV (benign paroxysmal positional vertigo) 06/28/2015   Irritable bowel syndrome 04/01/2015   Dyslipidemia, goal LDL below 70 04/01/2015   Multinodular goiter 12/19/2011   Constipation 07/28/2011   Dyslipidemia 07/28/2011   Acid reflux 09/25/2006   Peripheral vascular disease (HCC) 09/25/2006    PCP: Alvan Dorothyann BIRCH, MD   REFERRING PROVIDER: Curtis Debby PARAS MD   REFERRING DIAG: M51.360 (ICD-10-CM) - Degeneration of intervertebral disc of lumbar region with discogenic back pain - Hip and back  Rationale for Evaluation and Treatment: Rehabilitation  THERAPY DIAG:  Pain in thoracic spine  Other low back pain  Muscle weakness (generalized)  Mid back pain  Other symptoms and signs involving the musculoskeletal system  Acute bilateral low back pain without sciatica  ONSET DATE: 2024 after caring for her mom  SUBJECTIVE:                                                                                                                                                                                           SUBJECTIVE STATEMENT: Patient reports she has stiffness along Lt side of mid-low back and she finds herself bending forwarding to move away from stiffness.   Evaluation: Patient reporting pain from bra line down and then laterally to R. Also having L hip pain in the front to where she can't hardly walk. Intermittent tingling down B legs.   PERTINENT HISTORY:  Osteoporosis , Brain aneurysm w/ surgery 10/22, CAD  PAIN:  Are you having pain? Yes: NPRS  scale: 2/10 now up to 10/10 Pain location: bra line to low back and off to R>L Pain description: throbbing, pulling Aggravating factors: twisting to the R, lifting, lying on left side Relieving factors: sitting straight  PRECAUTIONS: Other: OP  RED FLAGS: None   WEIGHT BEARING RESTRICTIONS: No  FALLS:  Has patient fallen in last 6 months? 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  DIAGNOSTIC FINDINGS:  MRI 07/30/23  IMPRESSION: Facet degenerative disease at L5-S1 is worse on the left and results in trace anterolisthesis L5 on S1. The central canal and foramina are widely patent at all levels with minimal disc bulging at L5-S1 noted.  Lumbar XR IMPRESSION: Mild degenerative joint changes at L5-S1.  PATIENT SURVEYS:  Modified Oswestry 30 / 50 = 60.0 %   COGNITION: Overall cognitive status: Within functional limits for tasks assessed     SENSATION: Not tested  MUSCLE LENGTH: B HS tightness R>L and R psoas tightness  POSTURE: rounded shoulders, forward head, decreased lumbar lordosis, increased thoracic kyphosis, flexed trunk   PALPATION: Tightness in B thoracic paraspinals  LUMBAR ROM: WFL - Left SB pulling on R, left rotation caused cramping L bra line   LOWER EXTREMITY ROM:     LOWER EXTREMITY MMT:  core weakness with hip flexor testing  MMT Right eval Left eval  Hip flexion 4 5  Hip extension 5 5  Hip abduction 5 5  Hip adduction 5 5  Hip internal rotation    Hip external  rotation    Knee flexion    Knee extension    Ankle dorsiflexion    Ankle plantarflexion    Ankle inversion    Ankle eversion     (Blank rows = not tested)  FUNCTIONAL TESTS:  5 times sit to stand: 17.74   [facility] Adult PT Treatment:                                                DATE: 09/12/2023 Therapeutic Exercise: Seated trunk flexion stretch with red PB --> front & diagonals Quadruped rocking Paloff press + blue TB --> added vertical lift (bil) Small range thoracic rotation with forearms on wall --> focus on scap protraction stretch Manual Therapy: STM/IASTM along Lt side thoracolumbar paraspinals Neuromuscular re-ed: Seated on dyna disc: A/P pelvic tilts Lateral pelvic tilts Circles CW/CCW Seated on red PB --> pelvic tilts & circles Cat/cow + RTB at mid back for mobility cue  Thoracic extension in quadruped on forearms    [facility] Adult PT Treatment:                                                DATE: 09/10/23 Therapeutic Exercise/Activity: Seated on dyna disc: pelvic tilts, pelvic circles ab isometric table top position Oblique isometric table top position Quadruped tail wags Attempted quadruped hip ext - limited by groin pain on Rt QL doorway stretch Sit to stand to overhead press 7# in  1 UE 2 x 5 bilat Pallof press red TB x 10 bilat Green TB pull down with march   [facility] Adult PT Treatment:                                                DATE: 08/28/23 Therapeutic Exercise/Activity: Pelvic tilts on dyna disc cuing to sperate pelvis from spine Pelvic cirlces on dynadisc TA x 5 sec hold x 5 TA + BKFO TA + march LTR in tolerable range x 10 Doorway QL stretch Quadruped tail wags Suitcase carry 10# KB Pallof press red TB Manual Therapy: STM Rt QL  Self Care: Discussion of sleeping positions for home     PATIENT EDUCATION:  Education details: PT eval findings, anticipated POC, initial HEP, postural awareness, posture and body mechanics for typical daily  postioning, mobility and household tasks, and Osteoporosis education  Person educated: Patient Education method: Explanation, Demonstration, and Handouts Education comprehension: verbalized understanding and returned demonstration  HOME EXERCISE PROGRAM:  Access Code: [email or web address] URL: [email or web address] Date: 08/28/2023 Prepared by: Darice Conine  Exercises - Supine Lower Trunk Rotation  - 1 x daily - 7 x weekly - 3 sets - 10 reps - Seated Hip Flexor Stretch  - 1 x daily - 7 x weekly - 1 sets - 3 reps - 30 seconds hold - Hooklying Sequential Leg March and Lower  - 1 x daily - 3 x weekly - 2 sets - 10 reps - Cat Cow  - 1 x daily - 7 x weekly - 2 sets - 10 reps - Standing Scapular Retraction in Abduction  - 1 x daily - 7 x weekly - 2 sets - 10 reps - 2-3 sec hold - Seated Pelvic Tilt  - 1 x daily - 7 x weekly - 2 sets - 10 reps - Standing Anti-Rotation Press with Anchored Resistance  - 1 x daily - 7 x weekly - 3 sets - 10 reps - Standing Quadratus Lumborum Stretch with Doorway  - 1 x daily - 7 x weekly - 1 sets - 10 reps - 10 seconds hold    ASSESSMENT:  CLINICAL IMPRESSION: Patient had difficulty differentiating pelvis form upper trunk during seated pelvic tilts; modifying exercise to seated on physioball and providing tactile cues improved pelvic mobility in all directions. Moderate core activation noted during paloff press variations; cueing upight posture and TA activation improved form. Patient will continue to benefit from skilled therapy to address postural strength/mobility and pain deficits.  Evaluation: Patient is a 63 y.o. female who was seen today for physical therapy evaluation and treatment for mid and low back and left hip pain. She has a history of Osteoporosis and was seen in April of this year in this clinic, but discharged due to her high pain levels and lack of tolerance to PT. She has pain and spasm with lumbar rotation and tightness in B thoracic  paraspinals. She demonstrates rounded shoulders and increased thoracic kyphosis as well as decreased knowledge about body mechanics especially as it relates to osteoporosis. Her modified oswestry score is 30 / 50 = 60.0 %. She was able to complete the full evaluation with no significant change in her pain levels and should be able to tolerate PT this episode. She is getting an MRI this afternoon .She will benefit from skilled PT to address these deficits.  GOALS: Goals reviewed with patient? Yes  SHORT TERM GOALS: Target date: 08/27/2023   Patient will be independent with initial HEP.  Baseline:  Goal status: MET  2.  Patient will report decreased pain by 30% with walking.  Baseline:  Goal status: IN PROGRESS   LONG TERM GOALS: Target date: 09/24/2023   Patient will be independent with advanced/ongoing HEP to improve outcomes and carryover; specifically weightbearing exercises to address her OP.  Baseline:  Goal status: INITIAL  2.  Patient will report 75%% improvement in low back and L hip pain with ADLs to improve QOL.  Baseline:  Goal status: INITIAL  3.  Patient will demonstrate correct body mechanics for lifting and squatting to avoid further injury and allow her to care for her grandson.   Baseline:  Goal status: INITIAL  4.  Patient will demonstrate improved 5XSTS by 2-3 seconds demonstrating improved LE functional strength. Baseline: 17.74 sec Goal status: INITIAL  5.  Patient will score 24 on the Modified Oswestry demonstrating improved functional ability.  Baseline: 30 Goal status: INITIAL   PLAN:  PT FREQUENCY: 2x/week  PT DURATION: 8 weeks  PLANNED INTERVENTIONS: 97164- PT Re-evaluation, 97110-Therapeutic exercises, 97530- Therapeutic activity, 97112- Neuromuscular re-education, 97535- Self Care, [id number]- Manual therapy, [phone]- Aquatic Therapy, [phone]- Electrical stimulation (unattended), [phone] (1-2 muscles), [phone] (3+ muscles)- Dry Needling, Patient/Family  education, Joint mobilization, Spinal mobilization, Cryotherapy, and Moist heat.  PLAN FOR NEXT SESSION: assess response to HEP, work on lumbar mobility,, focus on resistance training and weightbearing exercises for OP, functional LE strength, postural strength, body mechanics and ADL modifications    Lamarr Price, PTA 09/12/23, 12:14 PM

## 2023-09-14 ENCOUNTER — Other Ambulatory Visit: Payer: Self-pay | Admitting: Family Medicine

## 2023-09-14 DIAGNOSIS — K219 Gastro-esophageal reflux disease without esophagitis: Secondary | ICD-10-CM

## 2023-09-17 ENCOUNTER — Encounter: Payer: Self-pay | Admitting: Physical Therapy

## 2023-09-17 ENCOUNTER — Ambulatory Visit: Admitting: Physical Therapy

## 2023-09-17 DIAGNOSIS — M6281 Muscle weakness (generalized): Secondary | ICD-10-CM | POA: Diagnosis not present

## 2023-09-17 DIAGNOSIS — M546 Pain in thoracic spine: Secondary | ICD-10-CM | POA: Diagnosis not present

## 2023-09-17 DIAGNOSIS — Z01419 Encounter for gynecological examination (general) (routine) without abnormal findings: Secondary | ICD-10-CM | POA: Diagnosis not present

## 2023-09-17 DIAGNOSIS — R29898 Other symptoms and signs involving the musculoskeletal system: Secondary | ICD-10-CM | POA: Diagnosis not present

## 2023-09-17 DIAGNOSIS — N952 Postmenopausal atrophic vaginitis: Secondary | ICD-10-CM | POA: Diagnosis not present

## 2023-09-17 DIAGNOSIS — M5459 Other low back pain: Secondary | ICD-10-CM

## 2023-09-17 DIAGNOSIS — Z6829 Body mass index (BMI) 29.0-29.9, adult: Secondary | ICD-10-CM | POA: Diagnosis not present

## 2023-09-17 DIAGNOSIS — M545 Low back pain, unspecified: Secondary | ICD-10-CM | POA: Diagnosis not present

## 2023-09-17 DIAGNOSIS — M549 Dorsalgia, unspecified: Secondary | ICD-10-CM | POA: Diagnosis not present

## 2023-09-17 NOTE — Therapy (Addendum)
 OUTPATIENT PHYSICAL THERAPY THORACOLUMBAR TREATMENT AND RECERTIFICATION   Patient Name: Tina Navarro MRN: [id number] DOB:05-14-1960, 63 y.o., female Today's Date: 09/17/2023  END OF SESSION:  PT End of Session - 09/17/23 1437     Visit Number 9    Number of Visits 16    Date for PT Re-Evaluation 09/24/23    Authorization Type BCBS federal    Authorization - Visit Number 9    Progress Note Due on Visit 10    PT Start Time 1400    PT Stop Time 1438    PT Time Calculation (min) 38 min    Activity Tolerance Patient tolerated treatment well    Behavior During Therapy [facility] for tasks assessed/performed          Past Medical History:  Diagnosis Date   Anxiety    Basilar artery aneurysm (HCC)    s/p stent supported coil embolization of basilar aneurysm 12/02/20   COVID-19 10/16/2022   Dyspnea    Emphysematous COPD (HCC) 04/23/2017   GERD (gastroesophageal reflux disease)    Hyperlipidemia    Hypertension    Peripheral arterial disease (HCC)    Thyroid  nodule 04/23/2017   Past Surgical History:  Procedure Laterality Date   ABDOMINAL AORTOGRAM N/A 07/16/2017   Procedure: ABDOMINAL AORTOGRAM;  Surgeon: Serene Gaile ORN, MD;  Location: [facility];  Service: Cardiovascular;  Laterality: N/A;   ABDOMINAL AORTOGRAM W/LOWER EXTREMITY Bilateral 12/02/2018   Procedure: ABDOMINAL AORTOGRAM W/LOWER EXTREMITY;  Surgeon: Serene Gaile ORN, MD;  Location: [facility];  Service: Cardiovascular;  Laterality: Bilateral;   AORTIC ARCH ANGIOGRAPHY N/A 12/02/2018   Procedure: AORTIC ARCH ANGIOGRAPHY;  Surgeon: Serene Gaile ORN, MD;  Location: [facility];  Service: Cardiovascular;  Laterality: N/A;   basilaraneurysm repair  11/2020   CAROTID-SUBCLAVIAN BYPASS GRAFT Left 02/22/2021   Procedure: LEFT CAROTID-SUBCLAVIAN BYPASS USING HEAMSHIELD GOLD X30MM;  Surgeon: Serene Gaile ORN, MD;  Location: [facility] OR;  Service: Vascular;  Laterality: Left;   ENDOMETRIAL BIOPSY  2005    FEMORAL ARTERY STENT Bilateral 2008   IR 3D INDEPENDENT WKST  12/02/2020   IR ANGIO INTRA EXTRACRAN SEL INTERNAL CAROTID UNI R MOD SED  11/08/2020   IR ANGIO VERTEBRAL SEL SUBCLAVIAN INNOMINATE UNI R MOD SED  11/08/2020   IR ANGIO VERTEBRAL SEL VERTEBRAL BILAT MOD SED  12/02/2020   IR ANGIO VERTEBRAL SEL VERTEBRAL UNI R MOD SED  06/16/2021   IR ANGIOGRAM FOLLOW UP STUDY  12/02/2020   IR ANGIOGRAM FOLLOW UP STUDY  12/02/2020   IR ANGIOGRAM FOLLOW UP STUDY  12/02/2020   IR ANGIOGRAM FOLLOW UP STUDY  12/02/2020   IR TRANSCATH/EMBOLIZ  12/02/2020   IR US  GUIDE VASC ACCESS RIGHT  11/08/2020   IR US  GUIDE VASC ACCESS RIGHT  12/02/2020   IR US  GUIDE VASC ACCESS RIGHT  06/16/2021   IR US  GUIDE VASC ACCESS RIGHT  06/16/2021   LOWER EXTREMITY ANGIOGRAPHY Bilateral 07/16/2017   Procedure: Lower Extremity Angiography;  Surgeon: Serene Gaile ORN, MD;  Location: [facility];  Service: Cardiovascular;  Laterality: Bilateral;   NASAL SINUS SURGERY     PERIPHERAL VASCULAR BALLOON ANGIOPLASTY Bilateral 07/16/2017   Procedure: PERIPHERAL VASCULAR BALLOON ANGIOPLASTY;  Surgeon: Serene Gaile ORN, MD;  Location: [facility];  Service: Cardiovascular;  Laterality: Bilateral;  iliacs   PERIPHERAL VASCULAR INTERVENTION Bilateral 12/02/2018   Procedure: PERIPHERAL VASCULAR INTERVENTION;  Surgeon: Serene Gaile ORN, MD;  Location: [facility];  Service:  Cardiovascular;  Laterality: Bilateral;   RADIOLOGY WITH ANESTHESIA N/A 12/02/2020   Procedure: Arteriogram, Surpass embolization of basilar aneurysm;  Surgeon: Lanis Pupa, MD;  Location: [facility] OR;  Service: Radiology;  Laterality: N/A;   TONSILLECTOMY     TUBAL LIGATION     VASCULAR SURGERY     Patient Active Problem List   Diagnosis Date Noted   Left groin pain 07/19/2023   Lumbar degenerative disc disease 06/28/2023   Age-related osteoporosis without current pathological fracture 12/19/2022   OSA (obstructive sleep apnea) 08/29/2022    IFG (impaired fasting glucose) 08/29/2022   History of basal cell cancer, nose 10/12/2021   Thoracic aortic ectasia (HCC) 08/17/2021   Left subclavian artery occlusion 02/22/2021   Aneurysm, cerebral, nonruptured 12/02/2020   Anxiety state 12/25/2017   Thyroid  nodule 04/23/2017   Emphysematous COPD (HCC) 04/23/2017   Neuropathy involving both lower extremities 04/22/2017   Pleuritic chest pain 04/22/2017   Essential hypertension 05/01/2016   Abnormal findings on esophagogastroduodenoscopy (EGD) 03/18/2016   BPPV (benign paroxysmal positional vertigo) 06/28/2015   Irritable bowel syndrome 04/01/2015   Dyslipidemia, goal LDL below 70 04/01/2015   Multinodular goiter 12/19/2011   Constipation 07/28/2011   Dyslipidemia 07/28/2011   Acid reflux 09/25/2006   Peripheral vascular disease (HCC) 09/25/2006    PCP: Alvan Dorothyann BIRCH, MD   REFERRING PROVIDER: Curtis Debby PARAS MD   REFERRING DIAG: M51.360 (ICD-10-CM) - Degeneration of intervertebral disc of lumbar region with discogenic back pain - Hip and back  Rationale for Evaluation and Treatment: Rehabilitation  THERAPY DIAG:  Pain in thoracic spine  Other low back pain  Muscle weakness (generalized)  ONSET DATE: 2024 after caring for her mom  SUBJECTIVE:                                                                                                                                                                                           SUBJECTIVE STATEMENT: Patient reports mid low back pain on Lt that just started today. She states it hurts every time she breathes.  Evaluation: Patient reporting pain from bra line down and then laterally to R. Also having L hip pain in the front to where she can't hardly walk. Intermittent tingling down B legs.   PERTINENT HISTORY:  Osteoporosis , Brain aneurysm w/ surgery 10/22, CAD  PAIN:  Are you having pain? Yes: NPRS scale: 5/10 now up to 10/10 Pain location: bra line to  low back and off to R>L Pain description: throbbing, pulling Aggravating factors: twisting to the R, lifting, lying on left side Relieving factors: sitting straight  PRECAUTIONS: Other: OP  RED FLAGS: None  WEIGHT BEARING RESTRICTIONS: No  FALLS:  Has patient fallen in last 6 months? 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  DIAGNOSTIC FINDINGS:  MRI 07/30/23  IMPRESSION: Facet degenerative disease at L5-S1 is worse on the left and results in trace anterolisthesis L5 on S1. The central canal and foramina are widely patent at all levels with minimal disc bulging at L5-S1 noted.  Lumbar XR IMPRESSION: Mild degenerative joint changes at L5-S1.  PATIENT SURVEYS:  Modified Oswestry 30 / 50 = 60.0 %   COGNITION: Overall cognitive status: Within functional limits for tasks assessed     SENSATION: Not tested  MUSCLE LENGTH: B HS tightness R>L and R psoas tightness  POSTURE: rounded shoulders, forward head, decreased lumbar lordosis, increased thoracic kyphosis, flexed trunk   PALPATION: Tightness in B thoracic paraspinals  LUMBAR ROM: WFL - Left SB pulling on R, left rotation caused cramping L bra line   LOWER EXTREMITY ROM:     LOWER EXTREMITY MMT:  core weakness with hip flexor testing  MMT Right eval Left eval  Hip flexion 4 5  Hip extension 5 5  Hip abduction 5 5  Hip adduction 5 5  Hip internal rotation    Hip external rotation    Knee flexion    Knee extension    Ankle  dorsiflexion    Ankle plantarflexion    Ankle inversion    Ankle eversion     (Blank rows = not tested)  FUNCTIONAL TESTS:  5 times sit to stand: 17.74   [facility] Adult PT Treatment:                                                DATE: 09/17/23 Therapeutic Exercise: Side bend stretch x 10 bilat Physioball roll outs all directions Thread the needle at wall x 10 bilat QL doorway stretch Manual Therapy: STM and cupping Lt thoracic and lumbar paraspinals, QL   [facility] Adult PT Treatment:                                                DATE: 09/12/2023 Therapeutic Exercise: Seated trunk flexion stretch with red PB --> front & diagonals Quadruped rocking Paloff press + blue TB --> added vertical lift (bil) Small range thoracic rotation with forearms on wall --> focus on scap protraction stretch Manual Therapy: STM/IASTM along Lt side thoracolumbar paraspinals Neuromuscular re-ed: Seated on dyna disc: A/P pelvic tilts Lateral pelvic tilts Circles CW/CCW Seated on red PB --> pelvic tilts & circles Cat/cow + RTB at mid back for mobility cue  Thoracic extension in quadruped on forearms    [facility] Adult PT Treatment:  DATE: 09/10/23 Therapeutic Exercise/Activity: Seated on dyna disc: pelvic tilts, pelvic circles ab isometric table top position Oblique isometric table top position Quadruped tail wags Attempted quadruped hip ext - limited by groin pain on Rt QL doorway stretch Sit to stand to overhead press 7# in 1 UE 2 x 5 bilat Pallof press red TB x 10 bilat Green TB pull down with march   [facility] Adult PT Treatment:                                                DATE: 08/28/23 Therapeutic Exercise/Activity: Pelvic tilts on dyna disc cuing to sperate pelvis from spine Pelvic cirlces on dynadisc TA x 5 sec hold x 5 TA + BKFO TA + march LTR in tolerable range x 10 Doorway QL stretch Quadruped tail wags Suitcase carry 10# KB Pallof press red  TB Manual Therapy: STM Rt QL  Self Care: Discussion of sleeping positions for home     PATIENT EDUCATION:  Education details: PT eval findings, anticipated POC, initial HEP, postural awareness, posture and body mechanics for typical daily postioning, mobility and household tasks, and Osteoporosis education  Person educated: Patient Education method: Explanation, Demonstration, and Handouts Education comprehension: verbalized understanding and returned demonstration  HOME EXERCISE PROGRAM:  Access Code: [email or web address] URL: [email or web address] Date: 08/28/2023 Prepared by: Darice Conine  Exercises - Supine Lower Trunk Rotation  - 1 x daily - 7 x weekly - 3 sets - 10 reps - Seated Hip Flexor Stretch  - 1 x daily - 7 x weekly - 1 sets - 3 reps - 30 seconds hold - Hooklying Sequential Leg March and Lower  - 1 x daily - 3 x weekly - 2 sets - 10 reps - Cat Cow  - 1 x daily - 7 x weekly - 2 sets - 10 reps - Standing Scapular Retraction in Abduction  - 1 x daily - 7 x weekly - 2 sets - 10 reps - 2-3 sec hold - Seated Pelvic Tilt  - 1 x daily - 7 x weekly - 2 sets - 10 reps - Standing Anti-Rotation Press with Anchored Resistance  - 1 x daily - 7 x weekly - 3 sets - 10 reps - Standing Quadratus Lumborum Stretch with Doorway  - 1 x daily - 7 x weekly - 1 sets - 10 reps - 10 seconds hold    ASSESSMENT:  CLINICAL IMPRESSION: Treatment session focused on pain control as pt has increased Lt mid/low back pain every time she breathes. Used STM and cupping with some relief. Performed stretching and encouraged pt to continue stretching to tolerance at home. She will continue to benefit from skilled PT to address deficits and progress towards goals  Evaluation: Patient is a 63 y.o. female who was seen today for physical therapy evaluation and treatment for mid and low back and left hip pain. She has a history of Osteoporosis and was seen in April of this year in this clinic, but  discharged due to her high pain levels and lack of tolerance to PT. She has pain and spasm with lumbar rotation and tightness in B thoracic paraspinals. She demonstrates rounded shoulders and increased thoracic kyphosis as well as decreased knowledge about body mechanics especially as it relates to osteoporosis. Her modified oswestry score is 30 / 50 = 60.0 %. She was able to complete  the full evaluation with no significant change in her pain levels and should be able to tolerate PT this episode. She is getting an MRI this afternoon .She will benefit from skilled PT to address these deficits.       GOALS: Goals reviewed with patient? Yes  SHORT TERM GOALS: Target date: 08/27/2023   Patient will be independent with initial HEP.  Baseline:  Goal status: MET  2.  Patient will report decreased pain by 30% with walking.  Baseline:  Goal status: IN PROGRESS   LONG TERM GOALS: Target date: 11/02/2023  Patient will be independent with advanced/ongoing HEP to improve outcomes and carryover; specifically weightbearing exercises to address her OP.  Baseline:  Goal status: INITIAL  2.  Patient will report 75%% improvement in low back and L hip pain with ADLs to improve QOL.  Baseline:  Goal status: INITIAL  3.  Patient will demonstrate correct body mechanics for lifting and squatting to avoid further injury and allow her to care for her grandson.   Baseline:  Goal status: INITIAL  4.  Patient will demonstrate improved 5XSTS by 2-3 seconds demonstrating improved LE functional strength. Baseline: 17.74 sec Goal status: INITIAL  5.  Patient will score 24 on the Modified Oswestry demonstrating improved functional ability.  Baseline: 30 Goal status: INITIAL   PLAN:  PT FREQUENCY: 2x/week  PT DURATION: 6 weeks  PLANNED INTERVENTIONS: 97164- PT Re-evaluation, 97110-Therapeutic exercises, 97530- Therapeutic activity, 97112- Neuromuscular re-education, 97535- Self Care, [id number]- Manual therapy,  [phone]- Aquatic Therapy, [phone]- Electrical stimulation (unattended), [phone] (1-2 muscles), [phone] (3+ muscles)- Dry Needling, Patient/Family education, Joint mobilization, Spinal mobilization, Cryotherapy, and Moist heat.  PLAN FOR NEXT SESSION:  assess response to HEP, work on lumbar mobility,, focus on resistance training and weightbearing exercises for OP, functional LE strength, postural strength, body mechanics and ADL modifications   Darice Conine, PT,DPT07/22/252:38 PM

## 2023-09-24 ENCOUNTER — Other Ambulatory Visit: Payer: Self-pay | Admitting: Family Medicine

## 2023-09-24 DIAGNOSIS — R928 Other abnormal and inconclusive findings on diagnostic imaging of breast: Secondary | ICD-10-CM | POA: Diagnosis not present

## 2023-09-24 DIAGNOSIS — K588 Other irritable bowel syndrome: Secondary | ICD-10-CM

## 2023-09-24 DIAGNOSIS — R92313 Mammographic fatty tissue density, bilateral breasts: Secondary | ICD-10-CM | POA: Diagnosis not present

## 2023-09-24 LAB — HM MAMMOGRAPHY

## 2023-09-25 NOTE — Addendum Note (Signed)
 Addended by: REENA DARICE POUR on: 09/25/2023 09:00 AM   Modules accepted: Orders

## 2023-09-30 ENCOUNTER — Ambulatory Visit: Attending: Sports Medicine

## 2023-09-30 DIAGNOSIS — M545 Low back pain, unspecified: Secondary | ICD-10-CM | POA: Diagnosis present

## 2023-09-30 DIAGNOSIS — M546 Pain in thoracic spine: Secondary | ICD-10-CM | POA: Insufficient documentation

## 2023-09-30 DIAGNOSIS — M6281 Muscle weakness (generalized): Secondary | ICD-10-CM | POA: Insufficient documentation

## 2023-09-30 DIAGNOSIS — R29898 Other symptoms and signs involving the musculoskeletal system: Secondary | ICD-10-CM | POA: Insufficient documentation

## 2023-09-30 DIAGNOSIS — M549 Dorsalgia, unspecified: Secondary | ICD-10-CM | POA: Insufficient documentation

## 2023-09-30 DIAGNOSIS — M5459 Other low back pain: Secondary | ICD-10-CM | POA: Diagnosis present

## 2023-09-30 NOTE — Therapy (Signed)
 OUTPATIENT PHYSICAL THERAPY THORACOLUMBAR TREATMENT    Patient Name: Tina Navarro MRN: [id number] DOB:[birth year], 63 y.o., female Today's Date: 09/30/2023  END OF SESSION:  PT End of Session - 09/30/23 1403     Visit Number 10    Number of Visits 16    Date for PT Re-Evaluation 10/29/23    Authorization Type BCBS federal    Authorization - Visit Number --    Progress Note Due on Visit 10    PT Start Time 1405    PT Stop Time 1450    PT Time Calculation (min) 45 min    Activity Tolerance Patient tolerated treatment well    Behavior During Therapy [facility] for tasks assessed/performed          Past Medical History:  Diagnosis Date   Anxiety    Basilar artery aneurysm (HCC)    s/p stent supported coil embolization of basilar aneurysm 12/02/20   COVID-19 10/16/2022   Dyspnea    Emphysematous COPD (HCC) 04/23/2017   GERD (gastroesophageal reflux disease)    Hyperlipidemia    Hypertension    Peripheral arterial disease (HCC)    Thyroid  nodule 04/23/2017   Past Surgical History:  Procedure Laterality Date   ABDOMINAL AORTOGRAM N/A 07/16/2017   Procedure: ABDOMINAL AORTOGRAM;  Surgeon: Serene Gaile ORN, MD;  Location: [facility];  Service: Cardiovascular;  Laterality: N/A;   ABDOMINAL AORTOGRAM W/LOWER EXTREMITY Bilateral 12/02/2018   Procedure: ABDOMINAL AORTOGRAM W/LOWER EXTREMITY;  Surgeon: Serene Gaile ORN, MD;  Location: [facility];  Service: Cardiovascular;  Laterality: Bilateral;   AORTIC ARCH ANGIOGRAPHY N/A 12/02/2018   Procedure: AORTIC ARCH ANGIOGRAPHY;  Surgeon: Serene Gaile ORN, MD;  Location: [facility];  Service: Cardiovascular;  Laterality: N/A;   basilaraneurysm repair  11/2020   CAROTID-SUBCLAVIAN BYPASS GRAFT Left 02/22/2021   Procedure: LEFT CAROTID-SUBCLAVIAN BYPASS USING HEAMSHIELD GOLD X30MM;  Surgeon: Serene Gaile ORN, MD;  Location: [facility] OR;  Service: Vascular;  Laterality: Left;   ENDOMETRIAL BIOPSY  2005   FEMORAL ARTERY  STENT Bilateral 2008   IR 3D INDEPENDENT WKST  12/02/2020   IR ANGIO INTRA EXTRACRAN SEL INTERNAL CAROTID UNI R MOD SED  11/08/2020   IR ANGIO VERTEBRAL SEL SUBCLAVIAN INNOMINATE UNI R MOD SED  11/08/2020   IR ANGIO VERTEBRAL SEL VERTEBRAL BILAT MOD SED  12/02/2020   IR ANGIO VERTEBRAL SEL VERTEBRAL UNI R MOD SED  06/16/2021   IR ANGIOGRAM FOLLOW UP STUDY  12/02/2020   IR ANGIOGRAM FOLLOW UP STUDY  12/02/2020   IR ANGIOGRAM FOLLOW UP STUDY  12/02/2020   IR ANGIOGRAM FOLLOW UP STUDY  12/02/2020   IR TRANSCATH/EMBOLIZ  12/02/2020   IR US  GUIDE VASC ACCESS RIGHT  11/08/2020   IR US  GUIDE VASC ACCESS RIGHT  12/02/2020   IR US  GUIDE VASC ACCESS RIGHT  06/16/2021   IR US  GUIDE VASC ACCESS RIGHT  06/16/2021   LOWER EXTREMITY ANGIOGRAPHY Bilateral 07/16/2017   Procedure: Lower Extremity Angiography;  Surgeon: Serene Gaile ORN, MD;  Location: [facility];  Service: Cardiovascular;  Laterality: Bilateral;   NASAL SINUS SURGERY     PERIPHERAL VASCULAR BALLOON ANGIOPLASTY Bilateral 07/16/2017   Procedure: PERIPHERAL VASCULAR BALLOON ANGIOPLASTY;  Surgeon: Serene Gaile ORN, MD;  Location: [facility];  Service: Cardiovascular;  Laterality: Bilateral;  iliacs   PERIPHERAL VASCULAR INTERVENTION Bilateral 12/02/2018   Procedure: PERIPHERAL VASCULAR INTERVENTION;  Surgeon: Serene Gaile ORN, MD;  Location: [facility];  Service: Cardiovascular;  Laterality: Bilateral;   RADIOLOGY WITH ANESTHESIA N/A 12/02/2020   Procedure: Arteriogram, Surpass embolization of basilar aneurysm;  Surgeon: Lanis Pupa, MD;  Location: [facility] OR;  Service: Radiology;  Laterality: N/A;   TONSILLECTOMY     TUBAL LIGATION     VASCULAR SURGERY     Patient Active Problem List   Diagnosis Date Noted   Left groin pain 07/19/2023   Lumbar degenerative disc disease 06/28/2023   Age-related osteoporosis without current pathological fracture 12/19/2022   OSA (obstructive sleep apnea) 08/29/2022   IFG (impaired  fasting glucose) 08/29/2022   History of basal cell cancer, nose 10/12/2021   Thoracic aortic ectasia (HCC) 08/17/2021   Left subclavian artery occlusion 02/22/2021   Aneurysm, cerebral, nonruptured 12/02/2020   Anxiety state 12/25/2017   Thyroid  nodule 04/23/2017   Emphysematous COPD (HCC) 04/23/2017   Neuropathy involving both lower extremities 04/22/2017   Pleuritic chest pain 04/22/2017   Essential hypertension 05/01/2016   Abnormal findings on esophagogastroduodenoscopy (EGD) 03/18/2016   BPPV (benign paroxysmal positional vertigo) 06/28/2015   Irritable bowel syndrome 04/01/2015   Dyslipidemia, goal LDL below 70 04/01/2015   Multinodular goiter 12/19/2011   Constipation 07/28/2011   Dyslipidemia 07/28/2011   Acid reflux 09/25/2006   Peripheral vascular disease (HCC) 09/25/2006    PCP: Alvan Dorothyann BIRCH, MD   REFERRING PROVIDER: Curtis Debby PARAS MD   REFERRING DIAG: M51.360 (ICD-10-CM) - Degeneration of intervertebral disc of lumbar region with discogenic back pain - Hip and back  Rationale for Evaluation and Treatment: Rehabilitation  THERAPY DIAG:  Pain in thoracic spine  Other low back pain  Muscle weakness (generalized)  Mid back pain  Other symptoms and signs involving the musculoskeletal system  Acute bilateral low back pain without sciatica  ONSET DATE: 2024 after caring for her mom  SUBJECTIVE:                                                                                                                                                                                           SUBJECTIVE STATEMENT: Patient reports she continues to have pain along Lt side f lower back, though not as bad as last PT session.   Evaluation: Patient reporting pain from bra line down and then laterally to R. Also having L hip pain in the front to where she can't hardly walk. Intermittent tingling down B legs.   PERTINENT HISTORY:  Osteoporosis , Brain aneurysm w/  surgery 10/22, CAD  PAIN:  Are you having pain? Yes: NPRS scale: 5/10 now up to 10/10 Pain location: bra line to low back and off to R>L Pain description: throbbing, pulling Aggravating factors:  twisting to the R, lifting, lying on left side Relieving factors: sitting straight  PRECAUTIONS: Other: OP  RED FLAGS: None   WEIGHT BEARING RESTRICTIONS: No  FALLS:  Has patient fallen in last 6 months? 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  DIAGNOSTIC FINDINGS:  MRI 07/30/23  IMPRESSION: Facet degenerative disease at L5-S1 is worse on the left and results in trace anterolisthesis L5 on S1. The central canal and foramina are widely patent at all levels with minimal disc bulging at L5-S1 noted.  Lumbar XR IMPRESSION: Mild degenerative joint changes at L5-S1.  PATIENT SURVEYS:  Modified Oswestry 30 / 50 = 60.0 %   COGNITION: Overall cognitive status: Within functional limits for tasks assessed     SENSATION: Not tested  MUSCLE LENGTH: B HS tightness R>L and R psoas tightness  POSTURE: rounded shoulders, forward head, decreased lumbar lordosis, increased thoracic kyphosis, flexed trunk   PALPATION: Tightness in B thoracic paraspinals  LUMBAR ROM: WFL - Left SB pulling on R, left rotation caused cramping L bra line   LOWER EXTREMITY ROM:     LOWER EXTREMITY MMT:  core weakness with hip flexor testing  MMT Right eval Left eval  Hip flexion 4 5  Hip extension 5 5  Hip abduction 5 5  Hip  adduction 5 5  Hip internal rotation    Hip external rotation    Knee flexion    Knee extension    Ankle dorsiflexion    Ankle plantarflexion    Ankle inversion    Ankle eversion     (Blank rows = not tested)  FUNCTIONAL TESTS:  5 times sit to stand: 17.74   [facility] Adult PT Treatment:                                                DATE: 09/30/2023 Therapeutic Exercise: Side trunk stretch over green bolster --> didn't feel much Lat stretch holding Matrix handle LTR x 1 min S/L open books x10 (bil) Thoracic rotation with forearms on wall Manual Therapy: STM Lt QL/lower lat in side lying Neuromuscular re-ed: Quadruped cat/cow --> focus on postural stabilization Alt sitting back child's pose & cow pose Serratus activation wall slides   [facility] Adult PT Treatment:                                                DATE: 09/17/23 Therapeutic Exercise: Side bend stretch x 10 bilat Physioball roll outs all directions Thread the needle at wall x 10 bilat QL doorway stretch Manual Therapy: STM and cupping Lt thoracic and lumbar paraspinals, QL   [facility] Adult PT Treatment:  DATE: 09/12/2023 Therapeutic Exercise: Seated trunk flexion stretch with red PB --> front & diagonals Quadruped rocking Paloff press + blue TB --> added vertical lift (bil) Small range thoracic rotation with forearms on wall --> focus on scap protraction stretch Manual Therapy: STM/IASTM along Lt side thoracolumbar paraspinals Neuromuscular re-ed: Seated on dyna disc: A/P pelvic tilts Lateral pelvic tilts Circles CW/CCW Seated on red PB --> pelvic tilts & circles Cat/cow + RTB at mid back for mobility cue  Thoracic extension in quadruped on forearms     PATIENT EDUCATION:  Education details: PT eval findings, anticipated POC, initial HEP, postural awareness, posture and body mechanics for typical daily postioning, mobility and household tasks, and Osteoporosis  education  Person educated: Patient Education method: Explanation, Demonstration, and Handouts Education comprehension: verbalized understanding and returned demonstration  HOME EXERCISE PROGRAM:  Access Code: [email or web address] URL: [email or web address] Date: 08/28/2023 Prepared by: Darice Conine  Exercises - Supine Lower Trunk Rotation  - 1 x daily - 7 x weekly - 3 sets - 10 reps - Seated Hip Flexor Stretch  - 1 x daily - 7 x weekly - 1 sets - 3 reps - 30 seconds hold - Hooklying Sequential Leg March and Lower  - 1 x daily - 3 x weekly - 2 sets - 10 reps - Cat Cow  - 1 x daily - 7 x weekly - 2 sets - 10 reps - Standing Scapular Retraction in Abduction  - 1 x daily - 7 x weekly - 2 sets - 10 reps - 2-3 sec hold - Seated Pelvic Tilt  - 1 x daily - 7 x weekly - 2 sets - 10 reps - Standing Anti-Rotation Press with Anchored Resistance  - 1 x daily - 7 x weekly - 3 sets - 10 reps - Standing Quadratus Lumborum Stretch with Doorway  - 1 x daily - 7 x weekly - 1 sets - 10 reps - 10 seconds hold    ASSESSMENT:  CLINICAL IMPRESSION: Attempted various stretches to target L lower lat/QL pointed pain, however only felt stretch on Rt side of lower trunk. Postural exercises continued on mat table and in standing; noted elevated shoulder compensation during cat/cow. Significant myofascial tightness/tension noted with palpitation on Lt QL/lower lat region. Tactile cues improved scapula stability during overhead arm wall slides.   Evaluation: Patient is a 63 y.o. female who was seen today for physical therapy evaluation and treatment for mid and low back and left hip pain. She has a history of Osteoporosis and was seen in April of this year in this clinic, but discharged due to her high pain levels and lack of tolerance to PT. She has pain and spasm with lumbar rotation and tightness in B thoracic paraspinals. She demonstrates rounded shoulders and increased thoracic kyphosis as well as decreased  knowledge about body mechanics especially as it relates to osteoporosis. Her modified oswestry score is 30 / 50 = 60.0 %. She was able to complete the full evaluation with no significant change in her pain levels and should be able to tolerate PT this episode. She is getting an MRI this afternoon .She will benefit from skilled PT to address these deficits.       GOALS: Goals reviewed with patient? Yes  SHORT TERM GOALS: Target date: 08/27/2023   Patient will be independent with initial HEP.  Baseline:  Goal status: MET  2.  Patient will report decreased pain by 30% with walking.  Baseline:  Goal status: IN PROGRESS  LONG TERM GOALS: Target date: 11/02/2023  Patient will be independent with advanced/ongoing HEP to improve outcomes and carryover; specifically weightbearing exercises to address her OP.  Baseline:  Goal status: INITIAL  2.  Patient will report 75%% improvement in low back and L hip pain with ADLs to improve QOL.  Baseline:  Goal status: INITIAL  3.  Patient will demonstrate correct body mechanics for lifting and squatting to avoid further injury and allow her to care for her grandson.   Baseline:  Goal status: INITIAL  4.  Patient will demonstrate improved 5XSTS by 2-3 seconds demonstrating improved LE functional strength. Baseline: 17.74 sec Goal status: INITIAL  5.  Patient will score 24 on the Modified Oswestry demonstrating improved functional ability.  Baseline: 30 Goal status: INITIAL   PLAN:  PT FREQUENCY: 2x/week  PT DURATION: 6 weeks  PLANNED INTERVENTIONS: 97164- PT Re-evaluation, 97110-Therapeutic exercises, 97530- Therapeutic activity, 97112- Neuromuscular re-education, 97535- Self Care, [id number]- Manual therapy, [phone]- Aquatic Therapy, [phone]- Electrical stimulation (unattended), [phone] (1-2 muscles), [phone] (3+ muscles)- Dry Needling, Patient/Family education, Joint mobilization, Spinal mobilization, Cryotherapy, and Moist heat.  PLAN FOR NEXT  SESSION: Continue work on lumbar mobility,  focus on resistance training and weightbearing exercises for OP, functional LE strength, postural strength, body mechanics and ADL modifications. Authorized number of visits? Dry Needling?   Lamarr Price, PTA 09/30/23, 4:45 PM

## 2023-10-02 ENCOUNTER — Encounter: Payer: Self-pay | Admitting: Physical Therapy

## 2023-10-02 ENCOUNTER — Ambulatory Visit: Payer: Self-pay | Admitting: Physical Therapy

## 2023-10-02 DIAGNOSIS — M5459 Other low back pain: Secondary | ICD-10-CM

## 2023-10-02 DIAGNOSIS — M549 Dorsalgia, unspecified: Secondary | ICD-10-CM | POA: Diagnosis not present

## 2023-10-02 DIAGNOSIS — M6281 Muscle weakness (generalized): Secondary | ICD-10-CM

## 2023-10-02 DIAGNOSIS — M546 Pain in thoracic spine: Secondary | ICD-10-CM | POA: Diagnosis not present

## 2023-10-02 DIAGNOSIS — M545 Low back pain, unspecified: Secondary | ICD-10-CM | POA: Diagnosis not present

## 2023-10-02 DIAGNOSIS — R29898 Other symptoms and signs involving the musculoskeletal system: Secondary | ICD-10-CM | POA: Diagnosis not present

## 2023-10-02 NOTE — Therapy (Signed)
 OUTPATIENT PHYSICAL THERAPY THORACOLUMBAR TREATMENT    Patient Name: Tina Navarro MRN: [id number] DOB:12-Sep-1960, 63 y.o., female Today's Date: 10/02/2023  END OF SESSION:  PT End of Session - 10/02/23 1100     Visit Number 11    Number of Visits 21    Date for PT Re-Evaluation 10/29/23    Authorization Type [employer] federal    Authorization - Visit Number 10    PT Start Time 1015    PT Stop Time 1100    PT Time Calculation (min) 45 min    Activity Tolerance Patient tolerated treatment well    Behavior During Therapy [facility] for tasks assessed/performed           Past Medical History:  Diagnosis Date   Anxiety    Basilar artery aneurysm (HCC)    s/p stent supported coil embolization of basilar aneurysm 12/02/20   COVID-19 10/16/2022   Dyspnea    Emphysematous COPD (HCC) 04/23/2017   GERD (gastroesophageal reflux disease)    Hyperlipidemia    Hypertension    Peripheral arterial disease (HCC)    Thyroid  nodule 04/23/2017   Past Surgical History:  Procedure Laterality Date   ABDOMINAL AORTOGRAM N/A 07/16/2017   Procedure: ABDOMINAL AORTOGRAM;  Surgeon: Serene Gaile ORN, MD;  Location: [facility];  Service: Cardiovascular;  Laterality: N/A;   ABDOMINAL AORTOGRAM W/LOWER EXTREMITY Bilateral 12/02/2018   Procedure: ABDOMINAL AORTOGRAM W/LOWER EXTREMITY;  Surgeon: Serene Gaile ORN, MD;  Location: [facility];  Service: Cardiovascular;  Laterality: Bilateral;   AORTIC ARCH ANGIOGRAPHY N/A 12/02/2018   Procedure: AORTIC ARCH ANGIOGRAPHY;  Surgeon: Serene Gaile ORN, MD;  Location: [facility];  Service: Cardiovascular;  Laterality: N/A;   basilaraneurysm repair  11/2020   CAROTID-SUBCLAVIAN BYPASS GRAFT Left 02/22/2021   Procedure: LEFT CAROTID-SUBCLAVIAN BYPASS USING HEAMSHIELD GOLD X30MM;  Surgeon: Serene Gaile ORN, MD;  Location: [facility];  Service: Vascular;  Laterality: Left;   ENDOMETRIAL BIOPSY  2005   FEMORAL ARTERY STENT Bilateral 2008   IR 3D  INDEPENDENT WKST  12/02/2020   IR ANGIO INTRA EXTRACRAN SEL INTERNAL CAROTID UNI R MOD SED  11/08/2020   IR ANGIO VERTEBRAL SEL SUBCLAVIAN INNOMINATE UNI R MOD SED  11/08/2020   IR ANGIO VERTEBRAL SEL VERTEBRAL BILAT MOD SED  12/02/2020   IR ANGIO VERTEBRAL SEL VERTEBRAL UNI R MOD SED  06/16/2021   IR ANGIOGRAM FOLLOW UP STUDY  12/02/2020   IR ANGIOGRAM FOLLOW UP STUDY  12/02/2020   IR ANGIOGRAM FOLLOW UP STUDY  12/02/2020   IR ANGIOGRAM FOLLOW UP STUDY  12/02/2020   IR TRANSCATH/EMBOLIZ  12/02/2020   IR US  GUIDE VASC ACCESS RIGHT  11/08/2020   IR US  GUIDE VASC ACCESS RIGHT  12/02/2020   IR US  GUIDE VASC ACCESS RIGHT  06/16/2021   IR US  GUIDE VASC ACCESS RIGHT  06/16/2021   LOWER EXTREMITY ANGIOGRAPHY Bilateral 07/16/2017   Procedure: Lower Extremity Angiography;  Surgeon: Serene Gaile ORN, MD;  Location: [facility];  Service: Cardiovascular;  Laterality: Bilateral;   NASAL SINUS SURGERY     PERIPHERAL VASCULAR BALLOON ANGIOPLASTY Bilateral 07/16/2017   Procedure: PERIPHERAL VASCULAR BALLOON ANGIOPLASTY;  Surgeon: Serene Gaile ORN, MD;  Location: [facility];  Service: Cardiovascular;  Laterality: Bilateral;  iliacs   PERIPHERAL VASCULAR INTERVENTION Bilateral 12/02/2018   Procedure: PERIPHERAL VASCULAR INTERVENTION;  Surgeon: Serene Gaile ORN, MD;  Location: [facility];  Service: Cardiovascular;  Laterality: Bilateral;   RADIOLOGY WITH ANESTHESIA  N/A 12/02/2020   Procedure: Arteriogram, Surpass embolization of basilar aneurysm;  Surgeon: Lanis Pupa, MD;  Location: [facility] OR;  Service: Radiology;  Laterality: N/A;   TONSILLECTOMY     TUBAL LIGATION     VASCULAR SURGERY     Patient Active Problem List   Diagnosis Date Noted   Left groin pain 07/19/2023   Lumbar degenerative disc disease 06/28/2023   Age-related osteoporosis without current pathological fracture 12/19/2022   OSA (obstructive sleep apnea) 08/29/2022   IFG (impaired fasting glucose) 08/29/2022    History of basal cell cancer, nose 10/12/2021   Thoracic aortic ectasia (HCC) 08/17/2021   Left subclavian artery occlusion 02/22/2021   Aneurysm, cerebral, nonruptured 12/02/2020   Anxiety state 12/25/2017   Thyroid  nodule 04/23/2017   Emphysematous COPD (HCC) 04/23/2017   Neuropathy involving both lower extremities 04/22/2017   Pleuritic chest pain 04/22/2017   Essential hypertension 05/01/2016   Abnormal findings on esophagogastroduodenoscopy (EGD) 03/18/2016   BPPV (benign paroxysmal positional vertigo) 06/28/2015   Irritable bowel syndrome 04/01/2015   Dyslipidemia, goal LDL below 70 04/01/2015   Multinodular goiter 12/19/2011   Constipation 07/28/2011   Dyslipidemia 07/28/2011   Acid reflux 09/25/2006   Peripheral vascular disease (HCC) 09/25/2006    PCP: Alvan Dorothyann BIRCH, MD   REFERRING PROVIDER: Curtis Debby PARAS MD   REFERRING DIAG: M51.360 (ICD-10-CM) - Degeneration of intervertebral disc of lumbar region with discogenic back pain - Hip and back  Rationale for Evaluation and Treatment: Rehabilitation  THERAPY DIAG:  Pain in thoracic spine  Other low back pain  Muscle weakness (generalized)  ONSET DATE: 2024 after caring for her mom  SUBJECTIVE:                                                                                                                                                                                           SUBJECTIVE STATEMENT: Patient reports she feels good today. She states her pain comes and goes and the level varies  Evaluation: Patient reporting pain from bra line down and then laterally to R. Also having L hip pain in the front to where she can't hardly walk. Intermittent tingling down B legs.   PERTINENT HISTORY:  Osteoporosis , Brain aneurysm w/ surgery 10/22, CAD  PAIN:  Are you having pain? Yes: NPRS scale: 2/10 now up to 10/10 Pain location: bra line to low back and off to R>L Pain description: throbbing,  pulling Aggravating factors: twisting to the R, lifting, lying on left side Relieving factors: sitting straight  PRECAUTIONS: Other: OP  RED FLAGS: None   WEIGHT BEARING RESTRICTIONS: No  FALLS:  Has patient fallen  in last 6 months? 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  DIAGNOSTIC FINDINGS:  MRI 07/30/23  IMPRESSION: Facet degenerative disease at L5-S1 is worse on the left and results in trace anterolisthesis L5 on S1. The central canal and foramina are widely patent at all levels with minimal disc bulging at L5-S1 noted.  Lumbar XR IMPRESSION: Mild degenerative joint changes at L5-S1.  PATIENT SURVEYS:  Modified Oswestry 30 / 50 = 60.0 %   COGNITION: Overall cognitive status: Within functional limits for tasks assessed     SENSATION: Not tested  MUSCLE LENGTH: B HS tightness R>L and R psoas tightness  POSTURE: rounded shoulders, forward head, decreased lumbar lordosis, increased thoracic kyphosis, flexed trunk   PALPATION: Tightness in B thoracic paraspinals  LUMBAR ROM: WFL - Left SB pulling on R, left rotation caused cramping L bra line   LOWER EXTREMITY ROM:     LOWER EXTREMITY MMT:  core weakness with hip flexor testing  MMT Right eval Left eval  Hip flexion 4 5  Hip extension 5 5  Hip abduction 5 5  Hip adduction 5 5  Hip internal rotation    Hip external rotation    Knee flexion    Knee extension    Ankle dorsiflexion    Ankle plantarflexion    Ankle inversion     Ankle eversion     (Blank rows = not tested)  FUNCTIONAL TESTS:  5 times sit to stand: 17.74 5 x STS (10/02/23): 11.70 seconds   [facility] Adult PT Treatment:                                                DATE: 10/02/23 Therapeutic Exercise/Activity: 5 x STS 11.7 seconds Quadruped cat/cow--> cuing for postural stabilization Quadruped thread the needle Seated QL stretch after needling Seated A/P pelvic tilts Manual Therapy: STM bilat lumbar paraspinals/QL Trigger Point Dry Needling  Initial Treatment: Pt instructed on Dry Needling rational, procedures, and possible side effects. Pt instructed to expect mild to moderate muscle soreness later in the day and/or into the next day.  Pt instructed in methods to reduce muscle soreness. Pt instructed to continue prescribed HEP. Because Dry Needling was performed over or adjacent to a lung field, pt was educated on S/S of pneumothorax and to seek immediate medical attention should they occur.  Patient was educated on signs and symptoms of infection and other risk factors and advised to seek medical attention should they occur.  Patient verbalized understanding of these instructions and education.   Patient Verbal Consent Given: Yes Education Handout Provided: Yes Muscles Treated: bilat lumbar paraspinals, QL Electrical Stimulation Performed: No Treatment Response/Outcome: palpable increase in muscle length    [facility] Adult PT Treatment:  DATE: 09/30/2023 Therapeutic Exercise: Side trunk stretch over green bolster --> didn't feel much Lat stretch holding Matrix handle LTR x 1 min S/L open books x10 (bil) Thoracic rotation with forearms on wall Manual Therapy: STM Lt QL/lower lat in side lying Neuromuscular re-ed: Quadruped cat/cow --> focus on postural stabilization Alt sitting back child's pose & cow pose Serratus activation wall slides   [facility] Adult PT Treatment:                                                 DATE: 09/17/23 Therapeutic Exercise: Side bend stretch x 10 bilat Physioball roll outs all directions Thread the needle at wall x 10 bilat QL doorway stretch Manual Therapy: STM and cupping Lt thoracic and lumbar paraspinals, QL   [facility] Adult PT Treatment:                                                DATE: 09/12/2023 Therapeutic Exercise: Seated trunk flexion stretch with red PB --> front & diagonals Quadruped rocking Paloff press + blue TB --> added vertical lift (bil) Small range thoracic rotation with forearms on wall --> focus on scap protraction stretch Manual Therapy: STM/IASTM along Lt side thoracolumbar paraspinals Neuromuscular re-ed: Seated on dyna disc: A/P pelvic tilts Lateral pelvic tilts Circles CW/CCW Seated on red PB --> pelvic tilts & circles Cat/cow + RTB at mid back for mobility cue  Thoracic extension in quadruped on forearms     PATIENT EDUCATION:  Education details: PT eval findings, anticipated POC, initial HEP, postural awareness, posture and body mechanics for typical daily postioning, mobility and household tasks, and Osteoporosis education  Person educated: Patient Education method: Explanation, Demonstration, and Handouts Education comprehension: verbalized understanding and returned demonstration  HOME EXERCISE PROGRAM:  Access Code: [email or web address] URL: [email or web address] Date: 08/28/2023 Prepared by: Darice Conine  Exercises - Supine Lower Trunk Rotation  - 1 x daily - 7 x weekly - 3 sets - 10 reps - Seated Hip Flexor Stretch  - 1 x daily - 7 x weekly - 1 sets - 3 reps - 30 seconds hold - Hooklying Sequential Leg March and Lower  - 1 x daily - 3 x weekly - 2 sets - 10 reps - Cat Cow  - 1 x daily - 7 x weekly - 2 sets - 10 reps - Standing Scapular Retraction in Abduction  - 1 x daily - 7 x weekly - 2 sets - 10 reps - 2-3 sec hold - Seated Pelvic Tilt  - 1 x daily - 7 x weekly - 2 sets - 10 reps - Standing  Anti-Rotation Press with Anchored Resistance  - 1 x daily - 7 x weekly - 3 sets - 10 reps - Standing Quadratus Lumborum Stretch with Doorway  - 1 x daily - 7 x weekly - 1 sets - 10 reps - 10 seconds hold    ASSESSMENT:  CLINICAL IMPRESSION: Trial of dry needling performed today with pt with increased muscle length after treatment. Encouraged pt to continue stretching throughout the day to maximize increased length muscle length  Evaluation: Patient is a 63 y.o. female who was seen today for physical therapy evaluation and treatment for mid and low back and  left hip pain. She has a history of Osteoporosis and was seen in April of this year in this clinic, but discharged due to her high pain levels and lack of tolerance to PT. She has pain and spasm with lumbar rotation and tightness in B thoracic paraspinals. She demonstrates rounded shoulders and increased thoracic kyphosis as well as decreased knowledge about body mechanics especially as it relates to osteoporosis. Her modified oswestry score is 30 / 50 = 60.0 %. She was able to complete the full evaluation with no significant change in her pain levels and should be able to tolerate PT this episode. She is getting an MRI this afternoon .She will benefit from skilled PT to address these deficits.       GOALS: Goals reviewed with patient? Yes  SHORT TERM GOALS: Target date: 08/27/2023   Patient will be independent with initial HEP.  Baseline:  Goal status: MET  2.  Patient will report decreased pain by 30% with walking.  Baseline:  Goal status: IN PROGRESS   LONG TERM GOALS: Target date: 11/02/2023  Patient will be independent with advanced/ongoing HEP to improve outcomes and carryover; specifically weightbearing exercises to address her OP.  Baseline:  Goal status: INITIAL  2.  Patient will report 75%% improvement in low back and L hip pain with ADLs to improve QOL.  Baseline:  Goal status: INITIAL  3.  Patient will demonstrate  correct body mechanics for lifting and squatting to avoid further injury and allow her to care for her grandson.   Baseline:  Goal status: INITIAL  4.  Patient will demonstrate improved 5XSTS by 2-3 seconds demonstrating improved LE functional strength. Baseline: 17.74 sec Goal status: MET  5.  Patient will score 24 on the Modified Oswestry demonstrating improved functional ability.  Baseline: 30 Goal status: INITIAL   PLAN:  PT FREQUENCY: 2x/week  PT DURATION: 6 weeks  PLANNED INTERVENTIONS: 97164- PT Re-evaluation, 97110-Therapeutic exercises, 97530- Therapeutic activity, 97112- Neuromuscular re-education, 97535- Self Care, [id number]- Manual therapy, [phone]- Aquatic Therapy, [phone]- Electrical stimulation (unattended), [phone] (1-2 muscles), [phone] (3+ muscles)- Dry Needling, Patient/Family education, Joint mobilization, Spinal mobilization, Cryotherapy, and Moist heat.  PLAN FOR NEXT SESSION: Continue work on lumbar mobility,  focus on resistance training and weightbearing exercises for OP, functional LE strength, postural strength, body mechanics and ADL modifications.    Darice Conine, PT,DPT08/07/2509:03 AM

## 2023-10-02 NOTE — Patient Instructions (Signed)

## 2023-10-09 ENCOUNTER — Ambulatory Visit

## 2023-10-09 DIAGNOSIS — M546 Pain in thoracic spine: Secondary | ICD-10-CM | POA: Diagnosis not present

## 2023-10-09 DIAGNOSIS — M5459 Other low back pain: Secondary | ICD-10-CM | POA: Diagnosis not present

## 2023-10-09 DIAGNOSIS — R29898 Other symptoms and signs involving the musculoskeletal system: Secondary | ICD-10-CM | POA: Diagnosis not present

## 2023-10-09 DIAGNOSIS — M545 Low back pain, unspecified: Secondary | ICD-10-CM | POA: Diagnosis not present

## 2023-10-09 DIAGNOSIS — M6281 Muscle weakness (generalized): Secondary | ICD-10-CM | POA: Diagnosis not present

## 2023-10-09 DIAGNOSIS — M549 Dorsalgia, unspecified: Secondary | ICD-10-CM | POA: Diagnosis not present

## 2023-10-09 NOTE — Therapy (Signed)
 OUTPATIENT PHYSICAL THERAPY THORACOLUMBAR TREATMENT    Patient Name: Tina Navarro MRN: [id number] DOB:01-31-1961, 63 y.o., female Today's Date: 10/09/2023  END OF SESSION:  PT End of Session - 10/09/23 1444     Visit Number 12    Number of Visits 21    Date for PT Re-Evaluation 10/29/23    Authorization Type BCBS federal    PT Start Time 1449    PT Stop Time 1529    PT Time Calculation (min) 40 min    Activity Tolerance Patient tolerated treatment well    Behavior During Therapy [facility] for tasks assessed/performed         Past Medical History:  Diagnosis Date   Anxiety    Basilar artery aneurysm (HCC)    s/p stent supported coil embolization of basilar aneurysm 12/02/20   COVID-19 10/16/2022   Dyspnea    Emphysematous COPD (HCC) 04/23/2017   GERD (gastroesophageal reflux disease)    Hyperlipidemia    Hypertension    Peripheral arterial disease (HCC)    Thyroid  nodule 04/23/2017   Past Surgical History:  Procedure Laterality Date   ABDOMINAL AORTOGRAM N/A 07/16/2017   Procedure: ABDOMINAL AORTOGRAM;  Surgeon: Serene Gaile ORN, MD;  Location: [facility];  Service: Cardiovascular;  Laterality: N/A;   ABDOMINAL AORTOGRAM W/LOWER EXTREMITY Bilateral 12/02/2018   Procedure: ABDOMINAL AORTOGRAM W/LOWER EXTREMITY;  Surgeon: Serene Gaile ORN, MD;  Location: [facility];  Service: Cardiovascular;  Laterality: Bilateral;   AORTIC ARCH ANGIOGRAPHY N/A 12/02/2018   Procedure: AORTIC ARCH ANGIOGRAPHY;  Surgeon: Serene Gaile ORN, MD;  Location: [facility];  Service: Cardiovascular;  Laterality: N/A;   basilaraneurysm repair  11/2020   CAROTID-SUBCLAVIAN BYPASS GRAFT Left 02/22/2021   Procedure: LEFT CAROTID-SUBCLAVIAN BYPASS USING HEAMSHIELD GOLD X30MM;  Surgeon: Serene Gaile ORN, MD;  Location: [facility];  Service: Vascular;  Laterality: Left;   ENDOMETRIAL BIOPSY  2005   FEMORAL ARTERY STENT Bilateral 2008   IR 3D INDEPENDENT WKST  12/02/2020   IR ANGIO  INTRA EXTRACRAN SEL INTERNAL CAROTID UNI R MOD SED  11/08/2020   IR ANGIO VERTEBRAL SEL SUBCLAVIAN INNOMINATE UNI R MOD SED  11/08/2020   IR ANGIO VERTEBRAL SEL VERTEBRAL BILAT MOD SED  12/02/2020   IR ANGIO VERTEBRAL SEL VERTEBRAL UNI R MOD SED  06/16/2021   IR ANGIOGRAM FOLLOW UP STUDY  12/02/2020   IR ANGIOGRAM FOLLOW UP STUDY  12/02/2020   IR ANGIOGRAM FOLLOW UP STUDY  12/02/2020   IR ANGIOGRAM FOLLOW UP STUDY  12/02/2020   IR TRANSCATH/EMBOLIZ  12/02/2020   IR US  GUIDE VASC ACCESS RIGHT  11/08/2020   IR US  GUIDE VASC ACCESS RIGHT  12/02/2020   IR US  GUIDE VASC ACCESS RIGHT  06/16/2021   IR US  GUIDE VASC ACCESS RIGHT  06/16/2021   LOWER EXTREMITY ANGIOGRAPHY Bilateral 07/16/2017   Procedure: Lower Extremity Angiography;  Surgeon: Serene Gaile ORN, MD;  Location: [facility];  Service: Cardiovascular;  Laterality: Bilateral;   NASAL SINUS SURGERY     PERIPHERAL VASCULAR BALLOON ANGIOPLASTY Bilateral 07/16/2017   Procedure: PERIPHERAL VASCULAR BALLOON ANGIOPLASTY;  Surgeon: Serene Gaile ORN, MD;  Location: [facility];  Service: Cardiovascular;  Laterality: Bilateral;  iliacs   PERIPHERAL VASCULAR INTERVENTION Bilateral 12/02/2018   Procedure: PERIPHERAL VASCULAR INTERVENTION;  Surgeon: Serene Gaile ORN, MD;  Location: [facility];  Service: Cardiovascular;  Laterality: Bilateral;   RADIOLOGY WITH ANESTHESIA N/A 12/02/2020   Procedure: Arteriogram, Surpass embolization of basilar  aneurysm;  Surgeon: Lanis Pupa, MD;  Location: [facility] OR;  Service: Radiology;  Laterality: N/A;   TONSILLECTOMY     TUBAL LIGATION     VASCULAR SURGERY     Patient Active Problem List   Diagnosis Date Noted   Left groin pain 07/19/2023   Lumbar degenerative disc disease 06/28/2023   Age-related osteoporosis without current pathological fracture 12/19/2022   OSA (obstructive sleep apnea) 08/29/2022   IFG (impaired fasting glucose) 08/29/2022   History of basal cell cancer, nose  10/12/2021   Thoracic aortic ectasia (HCC) 08/17/2021   Left subclavian artery occlusion 02/22/2021   Aneurysm, cerebral, nonruptured 12/02/2020   Anxiety state 12/25/2017   Thyroid  nodule 04/23/2017   Emphysematous COPD (HCC) 04/23/2017   Neuropathy involving both lower extremities 04/22/2017   Pleuritic chest pain 04/22/2017   Essential hypertension 05/01/2016   Abnormal findings on esophagogastroduodenoscopy (EGD) 03/18/2016   BPPV (benign paroxysmal positional vertigo) 06/28/2015   Irritable bowel syndrome 04/01/2015   Dyslipidemia, goal LDL below 70 04/01/2015   Multinodular goiter 12/19/2011   Constipation 07/28/2011   Dyslipidemia 07/28/2011   Acid reflux 09/25/2006   Peripheral vascular disease (HCC) 09/25/2006    PCP: Alvan Dorothyann BIRCH, MD   REFERRING PROVIDER: Curtis Debby PARAS MD   REFERRING DIAG: M51.360 (ICD-10-CM) - Degeneration of intervertebral disc of lumbar region with discogenic back pain - Hip and back  Rationale for Evaluation and Treatment: Rehabilitation  THERAPY DIAG:  Other low back pain  Muscle weakness (generalized)  Pain in thoracic spine  Mid back pain  Other symptoms and signs involving the musculoskeletal system  Acute bilateral low back pain without sciatica  ONSET DATE: 2024 after caring for her mom  SUBJECTIVE:                                                                                                                                                                                           SUBJECTIVE STATEMENT: Patient reports her back feels tight today but no pain; states the dry needling helped to release a lot of the tightness she was having in the back. Patient states she went for a 30 minute walk and didn't have any groin pain but had a pointed pain in Lt glute.    Evaluation: Patient reporting pain from bra line down and then laterally to R. Also having L hip pain in the front to where she can't hardly walk.  Intermittent tingling down B legs.   PERTINENT HISTORY:  Osteoporosis, Brain aneurysm w/ surgery 10/22, CAD  PAIN:  Are you having pain? Yes: NPRS scale: 0/10 now up to 5/10 Pain location: bra line  to low back and off to R>L Pain description: throbbing, pulling Aggravating factors: twisting to the R, lifting, lying on left side Relieving factors: sitting straight  PRECAUTIONS: Other: OP  RED FLAGS: None   WEIGHT BEARING RESTRICTIONS: No  FALLS:  Has patient fallen in last 6 months? 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  DIAGNOSTIC FINDINGS:  MRI 07/30/23  IMPRESSION: Facet degenerative disease at L5-S1 is worse on the left and results in trace anterolisthesis L5 on S1. The central canal and foramina are widely patent at all levels with minimal disc bulging at L5-S1 noted.  Lumbar XR IMPRESSION: Mild degenerative joint changes at L5-S1.  PATIENT SURVEYS:  Modified Oswestry 30 / 50 = 60.0 %   COGNITION: Overall cognitive status: Within functional limits for tasks assessed     SENSATION: Not tested  MUSCLE LENGTH: B HS tightness R>L and R psoas tightness  POSTURE: rounded shoulders, forward head, decreased lumbar lordosis, increased thoracic kyphosis, flexed trunk   PALPATION: Tightness in B thoracic paraspinals  LUMBAR ROM: WFL - Left SB pulling on R, left rotation caused cramping L bra line   LOWER EXTREMITY ROM:     LOWER EXTREMITY MMT:  core weakness with hip flexor  testing  MMT Right eval Left eval  Hip flexion 4 5  Hip extension 5 5  Hip abduction 5 5  Hip adduction 5 5  Hip internal rotation    Hip external rotation    Knee flexion    Knee extension    Ankle dorsiflexion    Ankle plantarflexion    Ankle inversion    Ankle eversion     (Blank rows = not tested)  FUNCTIONAL TESTS:  5 times sit to stand: 17.74 5 x STS (10/02/23): 11.70 seconds   [facility] Adult PT Treatment:                                                DATE: 10/09/2023 Therapeutic Exercise: Seated & supine figure 4 stretch Supine piriformis stretch Modified dead bug (unilateral) Dead bug Therapeutic Activity: Seated dead lift with dowel Seated dead lift + 5#KB --> 10#KB Standing dead lift with dowel Standing squat dead lift + 10#KB Self Care: Osteoporosis handout   [facility] Adult PT Treatment:                                                DATE: 10/02/23 Therapeutic Exercise/Activity: 5 x STS 11.7 seconds Quadruped cat/cow--> cuing for postural stabilization Quadruped thread the needle Seated QL stretch after needling Seated A/P pelvic tilts Manual Therapy: STM bilat lumbar paraspinals/QL Trigger Point Dry Needling  Initial Treatment: Pt instructed on Dry Needling rational, procedures, and possible side effects. Pt instructed to  expect mild to moderate muscle soreness later in the day and/or into the next day.  Pt instructed in methods to reduce muscle soreness. Pt instructed to continue prescribed HEP. Because Dry Needling was performed over or adjacent to a lung field, pt was educated on S/S of pneumothorax and to seek immediate medical attention should they occur.  Patient was educated on signs and symptoms of infection and other risk factors and advised to seek medical attention should they occur.  Patient verbalized understanding of these instructions and education.   Patient Verbal Consent Given: Yes Education Handout Provided: Yes Muscles Treated: bilat  lumbar paraspinals, QL Electrical Stimulation Performed: No Treatment Response/Outcome: palpable increase in muscle length    [facility] Adult PT Treatment:                                                DATE: 09/30/2023 Therapeutic Exercise: Side trunk stretch over green bolster --> didn't feel much Lat stretch holding Matrix handle LTR x 1 min S/L open books x10 (bil) Thoracic rotation with forearms on wall Manual Therapy: STM Lt QL/lower lat in side lying Neuromuscular re-ed: Quadruped cat/cow --> focus on postural stabilization Alt sitting back child's pose & cow pose Serratus activation wall slides     PATIENT EDUCATION:  Education details: Osteoporosis handout  Person educated: Patient Education method: Explanation, Demonstration, and Handouts Education comprehension: verbalized understanding and returned demonstration  HOME EXERCISE PROGRAM:  Access Code: [email or web address] URL: [email or web address] Date: 08/28/2023 Prepared by: Darice Conine  Exercises - Supine Lower Trunk Rotation  - 1 x daily - 7 x weekly - 3 sets - 10 reps - Seated Hip Flexor Stretch  - 1 x daily - 7 x weekly - 1 sets - 3 reps - 30 seconds hold - Hooklying Sequential Leg March and Lower  - 1 x daily - 3 x weekly - 2 sets - 10 reps - Cat Cow  - 1 x daily - 7 x weekly - 2 sets - 10 reps - Standing Scapular Retraction in Abduction  - 1 x daily - 7 x weekly - 2 sets - 10 reps - 2-3 sec hold - Seated Pelvic Tilt  - 1 x daily - 7 x weekly - 2 sets - 10 reps - Standing Anti-Rotation Press with Anchored Resistance  - 1 x daily - 7 x weekly - 3 sets - 10 reps - Standing Quadratus Lumborum Stretch with Doorway  - 1 x daily - 7 x weekly - 1 sets - 10 reps - 10 seconds hold  Access Code: [email or web address] URL: [email or web address] Date: 10/09/2023 Prepared by: Lamarr Price  Patient Education - Do it right & prevent fractures - Osteoporosis education  ASSESSMENT:  CLINICAL IMPRESSION: Hip  hinge mechanics reviewed and progressed from seated to standing dead lifts; dowel utilized to improve postural awareness. Lifting mechanics challenged with added resistance. Reviewed body mechanic guideline with osteoporosis precautions; provided handout on osteoporosis safe mechanics with every day activities. Noted fatigue in quads with legs in 90/90 position during core strengthening exercises; patient recovered quickly with rest breaks between exercises.   Evaluation: Patient is a 63 y.o. female who was seen today for physical therapy evaluation and treatment for mid and low back and left hip pain. She has a history of Osteoporosis and was seen in April of this year in this clinic, but discharged due  to her high pain levels and lack of tolerance to PT. She has pain and spasm with lumbar rotation and tightness in B thoracic paraspinals. She demonstrates rounded shoulders and increased thoracic kyphosis as well as decreased knowledge about body mechanics especially as it relates to osteoporosis. Her modified oswestry score is 30 / 50 = 60.0 %. She was able to complete the full evaluation with no significant change in her pain levels and should be able to tolerate PT this episode. She is getting an MRI this afternoon .She will benefit from skilled PT to address these deficits.       GOALS: Goals reviewed with patient? Yes  SHORT TERM GOALS: Target date: 08/27/2023   Patient will be independent with initial HEP.  Baseline:  Goal status: MET  2.  Patient will report decreased pain by 30% with walking.  Baseline:  Goal status: IN PROGRESS   LONG TERM GOALS: Target date: 11/02/2023  Patient will be independent with advanced/ongoing HEP to improve outcomes and carryover; specifically weightbearing exercises to address her OP.  Baseline:  Goal status: INITIAL  2.  Patient will report 75%% improvement in low back and L hip pain with ADLs to improve QOL.  Baseline:  Goal status: INITIAL  3.   Patient will demonstrate correct body mechanics for lifting and squatting to avoid further injury and allow her to care for her grandson.   Baseline:  Goal status: INITIAL  4.  Patient will demonstrate improved 5XSTS by 2-3 seconds demonstrating improved LE functional strength. Baseline: 17.74 sec Goal status: MET  5.  Patient will score 24 on the Modified Oswestry demonstrating improved functional ability.  Baseline: 30 Goal status: INITIAL   PLAN:  PT FREQUENCY: 2x/week  PT DURATION: 6 weeks  PLANNED INTERVENTIONS: 97164- PT Re-evaluation, 97110-Therapeutic exercises, 97530- Therapeutic activity, 97112- Neuromuscular re-education, 97535- Self Care, [id number]- Manual therapy, [phone]- Aquatic Therapy, [phone]- Electrical stimulation (unattended), [phone] (1-2 muscles), 20561 (3+ muscles)- Dry Needling, Patient/Family education, Joint mobilization, Spinal mobilization, Cryotherapy, and Moist heat.  PLAN FOR NEXT SESSION: Review osteoporosis guidelines as needed. Continue work on lumbar mobility,  focus on resistance training and weightbearing exercises for OP, functional LE strength, postural strength, body mechanics and ADL modifications.    Lamarr Price, PTA 10/09/23, 3:32 PM

## 2023-10-14 ENCOUNTER — Other Ambulatory Visit: Payer: Self-pay | Admitting: Sports Medicine

## 2023-10-14 ENCOUNTER — Ambulatory Visit

## 2023-10-14 DIAGNOSIS — M5136 Other intervertebral disc degeneration, lumbar region with discogenic back pain only: Secondary | ICD-10-CM

## 2023-10-14 DIAGNOSIS — M549 Dorsalgia, unspecified: Secondary | ICD-10-CM

## 2023-10-14 DIAGNOSIS — M6281 Muscle weakness (generalized): Secondary | ICD-10-CM | POA: Diagnosis not present

## 2023-10-14 DIAGNOSIS — M5459 Other low back pain: Secondary | ICD-10-CM | POA: Diagnosis not present

## 2023-10-14 DIAGNOSIS — M545 Low back pain, unspecified: Secondary | ICD-10-CM

## 2023-10-14 DIAGNOSIS — M546 Pain in thoracic spine: Secondary | ICD-10-CM | POA: Diagnosis not present

## 2023-10-14 DIAGNOSIS — R29898 Other symptoms and signs involving the musculoskeletal system: Secondary | ICD-10-CM | POA: Diagnosis not present

## 2023-10-14 NOTE — Therapy (Addendum)
 OUTPATIENT PHYSICAL THERAPY THORACOLUMBAR TREATMENT AND DISCHARGE   Patient Name: Tina Navarro MRN: [id number] DOB:12-12-1960, 63 y.o., female Today's Date: 10/14/2023  END OF SESSION:  PT End of Session - 10/14/23 1401     Visit Number 13    Number of Visits 21    Date for PT Re-Evaluation 10/29/23    Authorization Type BCBS federal    Progress Note Due on Visit 10    PT Start Time 1402    PT Stop Time 1440    PT Time Calculation (min) 38 min    Activity Tolerance Patient tolerated treatment well    Behavior During Therapy [facility] for tasks assessed/performed         Past Medical History:  Diagnosis Date   Anxiety    Basilar artery aneurysm (HCC)    s/p stent supported coil embolization of basilar aneurysm 12/02/20   COVID-19 10/16/2022   Dyspnea    Emphysematous COPD (HCC) 04/23/2017   GERD (gastroesophageal reflux disease)    Hyperlipidemia    Hypertension    Peripheral arterial disease (HCC)    Thyroid  nodule 04/23/2017   Past Surgical History:  Procedure Laterality Date   ABDOMINAL AORTOGRAM N/A 07/16/2017   Procedure: ABDOMINAL AORTOGRAM;  Surgeon: Serene Gaile ORN, MD;  [facility]: [facility];  Service: Cardiovascular;  Laterality: N/A;   ABDOMINAL AORTOGRAM W/LOWER EXTREMITY Bilateral 12/02/2018   Procedure: ABDOMINAL AORTOGRAM W/LOWER EXTREMITY;  Surgeon: Serene Gaile ORN, MD;  Location: [facility];  Service: Cardiovascular;  Laterality: Bilateral;   AORTIC ARCH ANGIOGRAPHY N/A 12/02/2018   Procedure: AORTIC ARCH ANGIOGRAPHY;  Surgeon: Serene Gaile ORN, MD;  Location: [facility];  Service: Cardiovascular;  Laterality: N/A;   basilaraneurysm repair  11/2020   CAROTID-SUBCLAVIAN BYPASS GRAFT Left 02/22/2021   Procedure: LEFT CAROTID-SUBCLAVIAN BYPASS USING HEAMSHIELD GOLD X30MM;  Surgeon: Serene Gaile ORN, MD;  Location: [facility] OR;  Service: Vascular;  Laterality: Left;   ENDOMETRIAL BIOPSY  2005   FEMORAL ARTERY STENT Bilateral 2008   IR  3D INDEPENDENT WKST  12/02/2020   IR ANGIO INTRA EXTRACRAN SEL INTERNAL CAROTID UNI R MOD SED  11/08/2020   IR ANGIO VERTEBRAL SEL SUBCLAVIAN INNOMINATE UNI R MOD SED  11/08/2020   IR ANGIO VERTEBRAL SEL VERTEBRAL BILAT MOD SED  12/02/2020   IR ANGIO VERTEBRAL SEL VERTEBRAL UNI R MOD SED  06/16/2021   IR ANGIOGRAM FOLLOW UP STUDY  12/02/2020   IR ANGIOGRAM FOLLOW UP STUDY  12/02/2020   IR ANGIOGRAM FOLLOW UP STUDY  12/02/2020   IR ANGIOGRAM FOLLOW UP STUDY  12/02/2020   IR TRANSCATH/EMBOLIZ  12/02/2020   IR US  GUIDE VASC ACCESS RIGHT  11/08/2020   IR US  GUIDE VASC ACCESS RIGHT  12/02/2020   IR US  GUIDE VASC ACCESS RIGHT  06/16/2021   IR US  GUIDE VASC ACCESS RIGHT  06/16/2021   LOWER EXTREMITY ANGIOGRAPHY Bilateral 07/16/2017   Procedure: Lower Extremity Angiography;  Surgeon: Serene Gaile ORN, MD;  [facility]: [facility];  Service: Cardiovascular;  Laterality: Bilateral;   NASAL SINUS SURGERY     PERIPHERAL VASCULAR BALLOON ANGIOPLASTY Bilateral 07/16/2017   Procedure: PERIPHERAL VASCULAR BALLOON ANGIOPLASTY;  Surgeon: Serene Gaile ORN, MD;  Location: [facility];  Service: Cardiovascular;  Laterality: Bilateral;  iliacs   PERIPHERAL VASCULAR INTERVENTION Bilateral 12/02/2018   Procedure: PERIPHERAL VASCULAR INTERVENTION;  Surgeon: Serene Gaile ORN, MD;  Location: [facility];  Service: Cardiovascular;  Laterality: Bilateral;   RADIOLOGY WITH ANESTHESIA  N/A 12/02/2020   Procedure: Arteriogram, Surpass embolization of basilar aneurysm;  Surgeon: Lanis Pupa, MD;  Location: [facility] OR;  Service: Radiology;  Laterality: N/A;   TONSILLECTOMY     TUBAL LIGATION     VASCULAR SURGERY     Patient Active Problem List   Diagnosis Date Noted   Left groin pain 07/19/2023   Lumbar degenerative disc disease 06/28/2023   Age-related osteoporosis without current pathological fracture 12/19/2022   OSA (obstructive sleep apnea) 08/29/2022   IFG (impaired fasting glucose) 08/29/2022    History of basal cell cancer, nose 10/12/2021   Thoracic aortic ectasia (HCC) 08/17/2021   Left subclavian artery occlusion 02/22/2021   Aneurysm, cerebral, nonruptured 12/02/2020   Anxiety state 12/25/2017   Thyroid  nodule 04/23/2017   Emphysematous COPD (HCC) 04/23/2017   Neuropathy involving both lower extremities 04/22/2017   Pleuritic chest pain 04/22/2017   Essential hypertension 05/01/2016   Abnormal findings on esophagogastroduodenoscopy (EGD) 03/18/2016   BPPV (benign paroxysmal positional vertigo) 06/28/2015   Irritable bowel syndrome 04/01/2015   Dyslipidemia, goal LDL below 70 04/01/2015   Multinodular goiter 12/19/2011   Constipation 07/28/2011   Dyslipidemia 07/28/2011   Acid reflux 09/25/2006   Peripheral vascular disease (HCC) 09/25/2006    PCP: Alvan Dorothyann BIRCH, MD   REFERRING PROVIDER: Curtis Debby PARAS MD   REFERRING DIAG: M51.360 (ICD-10-CM) - Degeneration of intervertebral disc of lumbar region with discogenic back pain - Hip and back  Rationale for Evaluation and Treatment: Rehabilitation  THERAPY DIAG:  Other low back pain  Muscle weakness (generalized)  Pain in thoracic spine  Mid back pain  Other symptoms and signs involving the musculoskeletal system  Acute bilateral low back pain without sciatica  ONSET DATE: 2024 after caring for her mom  SUBJECTIVE:                                                                                                                                                                                           SUBJECTIVE STATEMENT: Patient reports she had an episode of sharp pain at middle of low back that radiated down bilateral legs. Patient states when she walks she has a dull pain in Lt glute.   Evaluation: Patient reporting pain from bra line down and then laterally to R. Also having L hip pain in the front to where she can't hardly walk. Intermittent tingling down B legs.   PERTINENT HISTORY:   Osteoporosis, Brain aneurysm w/ surgery 10/22, CAD  PAIN:  Are you having pain? Yes: NPRS scale: 0/10 now Pain location: bra line to low back and off to R>L Pain description: throbbing, pulling Aggravating factors:  twisting to the R, lifting, lying on left side Relieving factors: sitting straight  PRECAUTIONS: Other: OP  RED FLAGS: None   WEIGHT BEARING RESTRICTIONS: No  FALLS:  Has patient fallen in last 6 months? 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  DIAGNOSTIC FINDINGS:  MRI 07/30/23  IMPRESSION: Facet degenerative disease at L5-S1 is worse on the left and results in trace anterolisthesis L5 on S1. The central canal and foramina are widely patent at all levels with minimal disc bulging at L5-S1 noted.  Lumbar XR IMPRESSION: Mild degenerative joint changes at L5-S1.  PATIENT SURVEYS:  Modified Oswestry 30 / 50 = 60.0 %   COGNITION: Overall cognitive status: Within functional limits for tasks assessed     SENSATION: Not tested  MUSCLE LENGTH: B HS tightness R>L and R psoas tightness  POSTURE: rounded shoulders, forward head, decreased lumbar lordosis, increased thoracic kyphosis, flexed trunk   PALPATION: Tightness in B thoracic paraspinals  LUMBAR ROM: WFL - Left SB pulling on R, left rotation caused cramping L bra line   LOWER EXTREMITY ROM:     LOWER EXTREMITY MMT:  core weakness with hip flexor testing  MMT Right eval Left eval  Hip flexion 4 5  Hip extension 5 5   Hip abduction 5 5  Hip adduction 5 5  Hip internal rotation    Hip external rotation    Knee flexion    Knee extension    Ankle dorsiflexion    Ankle plantarflexion    Ankle inversion    Ankle eversion     (Blank rows = not tested)  FUNCTIONAL TESTS:  5 times sit to stand: 17.74 5 x STS (10/02/23): 11.70 seconds   [facility] Adult PT Treatment:                                                DATE: 10/14/2023 Therapeutic Exercise: Modified Lt glute stretch in figure 4 (Rt leg propped on box) Review of osteoporosis lifting mechanics Neuromuscular re-ed: Modified unilateral heel taps Isometric deadbug Unilateral dead bug Therapeutic Activity: Seated hip hinge  STS in staggered stance + cradling 8#DB x10 each leg  Resisted dead lift with blue TB --> flared up low back Squats cradling 8#DB Walking + 5#DB farmer carry --> 8#DB farmer carry Walking + 8#DB suitcase carry   [facility] Adult PT Treatment:                                                DATE: 10/09/2023 Therapeutic Exercise: Seated & supine figure 4 stretch Supine piriformis stretch Modified dead bug (unilateral) Dead bug Therapeutic Activity: Seated dead lift with dowel Seated dead lift + 5#KB --> 10#KB Standing dead lift with dowel Standing  squat dead lift + 10#KB Self Care: Osteoporosis handout   [facility] Adult PT Treatment:                                                DATE: 10/02/23 Therapeutic Exercise/Activity: 5 x STS 11.7 seconds Quadruped cat/cow--> cuing for postural stabilization Quadruped thread the needle Seated QL stretch after needling Seated A/P pelvic tilts Manual Therapy: STM bilat lumbar paraspinals/QL Trigger Point Dry Needling  Initial Treatment: Pt instructed on Dry Needling rational, procedures, and possible side effects. Pt instructed to expect mild to moderate muscle soreness later in the day and/or into the next day.  Pt instructed in methods to reduce muscle soreness. Pt instructed to  continue prescribed HEP. Because Dry Needling was performed over or adjacent to a lung field, pt was educated on S/S of pneumothorax and to seek immediate medical attention should they occur.  Patient was educated on signs and symptoms of infection and other risk factors and advised to seek medical attention should they occur.  Patient verbalized understanding of these instructions and education.   Patient Verbal Consent Given: Yes Education Handout Provided: Yes Muscles Treated: bilat lumbar paraspinals, QL Electrical Stimulation Performed: No Treatment Response/Outcome: palpable increase in muscle length    PATIENT EDUCATION:  Education details: Osteoporosis handout  Person educated: Patient Education method: Explanation, Demonstration, and Handouts Education comprehension: verbalized understanding and returned demonstration  HOME EXERCISE PROGRAM: Access Code: [email or web address] URL: [email or web address] Date: 10/14/2023 Prepared by: Lamarr Price  Exercises - Supine Lower Trunk Rotation  - 1 x daily - 7 x weekly - 3 sets - 10 reps - Seated Hip Flexor Stretch  - 1 x daily - 7 x weekly - 1 sets - 3 reps - 30 seconds hold - Hooklying Sequential Leg March and Lower  - 1 x daily - 3 x weekly - 2 sets - 10 reps - Cat Cow  - 1 x daily - 7 x weekly - 2 sets - 10 reps - Standing Scapular Retraction in Abduction  - 1 x daily - 7 x weekly - 2 sets - 10 reps - 2-3 sec hold - Seated Pelvic Tilt  - 1 x daily - 7 x weekly - 2 sets - 10 reps - Standing Anti-Rotation Press with Anchored Resistance  - 1 x daily - 7 x weekly - 3 sets - 10 reps - Standing Quadratus Lumborum Stretch with Doorway  - 1 x daily - 7 x weekly - 1 sets - 10 reps - 10 seconds hold - Supine Dead Bug with Leg Extension  - 1 x daily - 7 x weekly - 3 sets - 10 reps - Standing Hip Hinge with Dowel  - 1 x daily - 7 x weekly - 3 sets - 10 reps - Mini Squat  - 1 x daily - 7 x weekly - 3 sets - 10 reps - Standing Marching   - 1 x daily - 7 x weekly - 3 sets - 20 reps - Kettlebell Suitcase Carry  - 1 x daily - 7 x weekly - 3 sets - 10 reps - Farmer's Carry with Kettlebells  - 1 x daily - 7 x weekly - 3 sets - 10 reps  Access Code: [email or web address] URL: [email or web address] Date: 10/09/2023 Prepared by: Lamarr Price  Patient Education - Do it right & prevent fractures - Osteoporosis education  ASSESSMENT:  CLINICAL IMPRESSION: Functional core strengthening progressed with bilateral and unilateral dumbbell carries during walking. Resisted dead lift aggravated low back pain; modifying to squats while cradling weight alleviated discomfort.   Evaluation: Patient is a 63 y.o. female who was seen today for physical therapy evaluation and treatment for mid and low back and left hip pain. She has a history of Osteoporosis and was seen in April of this year in this clinic, but discharged due to her high pain levels and lack of tolerance to PT. She has pain and spasm with lumbar rotation and tightness in B thoracic paraspinals. She demonstrates rounded shoulders and increased thoracic kyphosis as well as decreased knowledge about body mechanics especially as it relates to osteoporosis. Her modified oswestry score is 30 / 50 = 60.0 %. She was able to complete the full evaluation with no significant change in her pain levels and should be able to tolerate PT this episode. She is getting an MRI this afternoon .She will benefit from skilled PT to address these deficits.       GOALS: Goals reviewed with patient? Yes  SHORT TERM GOALS: Target date: 08/27/2023   Patient will be independent with initial HEP.  Baseline:  Goal status: MET  2.  Patient will report decreased pain by 30% with walking.  Baseline:  Goal status: IN PROGRESS   LONG TERM GOALS: Target date: 11/02/2023  Patient will be independent with advanced/ongoing HEP to improve outcomes and carryover; specifically weightbearing exercises to address her  OP.  Baseline:  Goal status: INITIAL  2.  Patient will report 75%% improvement in low back and L hip pain with ADLs to improve QOL.  Baseline:  Goal status: INITIAL  3.  Patient will demonstrate correct body mechanics for lifting and squatting to avoid further injury and allow her to care for her grandson.   Baseline:  Goal status: INITIAL  4.  Patient will demonstrate improved 5XSTS by 2-3 seconds demonstrating improved LE functional strength. Baseline: 17.74 sec Goal status: MET  5.  Patient will score 24 on the Modified Oswestry demonstrating improved functional ability.  Baseline: 30 Goal status: INITIAL   PLAN:  PT FREQUENCY: 2x/week  PT DURATION: 6 weeks  PLANNED INTERVENTIONS: 97164- PT Re-evaluation, 97110-Therapeutic exercises, 97530- Therapeutic activity, 97112- Neuromuscular re-education, 97535- Self Care, 02859- Manual therapy, [phone]- Aquatic Therapy, [phone]- Electrical stimulation (unattended), [phone] (1-2 muscles), 20561 (3+ muscles)- Dry Needling, Patient/Family education, Joint mobilization, Spinal mobilization, Cryotherapy, and Moist heat.  PLAN FOR NEXT SESSION: Review osteoporosis guidelines as needed. Continue work on lumbar mobility,  focus on resistance training and weightbearing exercises for OP, functional LE strength, postural strength, body mechanics and ADL modifications.    Lamarr Price, PTA 10/14/23, 2:46 PM  PHYSICAL THERAPY DISCHARGE SUMMARY  Visits from Start of Care: 13  Current functional level related to goals / functional outcomes: Improving activity tolerance   Remaining deficits: See above   Education / Equipment: HEP   Patient agrees to discharge. Patient goals were partially met. Patient is being discharged due to not returning since the last visit.  Darice Conine, PT,DPT12/16/20252:37 PM

## 2023-10-16 ENCOUNTER — Ambulatory Visit

## 2023-10-18 ENCOUNTER — Other Ambulatory Visit: Payer: Self-pay | Admitting: Sports Medicine

## 2023-10-18 DIAGNOSIS — M5136 Other intervertebral disc degeneration, lumbar region with discogenic back pain only: Secondary | ICD-10-CM

## 2023-10-25 ENCOUNTER — Ambulatory Visit: Admitting: Physical Therapy

## 2023-10-29 ENCOUNTER — Encounter: Payer: Self-pay | Admitting: Sports Medicine

## 2023-10-31 DIAGNOSIS — H35363 Drusen (degenerative) of macula, bilateral: Secondary | ICD-10-CM | POA: Diagnosis not present

## 2023-11-04 ENCOUNTER — Encounter: Payer: Self-pay | Admitting: Family Medicine

## 2023-11-04 DIAGNOSIS — M5136 Other intervertebral disc degeneration, lumbar region with discogenic back pain only: Secondary | ICD-10-CM

## 2023-11-04 MED ORDER — PREGABALIN 50 MG PO CAPS: 50.0000 mg | ORAL_CAPSULE | Freq: Three times a day (TID) | ORAL | 1 refills | Status: AC

## 2023-12-19 ENCOUNTER — Ambulatory Visit: Admitting: Family Medicine

## 2023-12-19 VITALS — BP 123/68 | HR 93 | Ht 65.0 in | Wt 176.1 lb

## 2023-12-19 DIAGNOSIS — Z23 Encounter for immunization: Secondary | ICD-10-CM

## 2023-12-19 DIAGNOSIS — K588 Other irritable bowel syndrome: Secondary | ICD-10-CM | POA: Diagnosis not present

## 2023-12-19 DIAGNOSIS — J439 Emphysema, unspecified: Secondary | ICD-10-CM

## 2023-12-19 DIAGNOSIS — M5136 Other intervertebral disc degeneration, lumbar region with discogenic back pain only: Secondary | ICD-10-CM | POA: Diagnosis not present

## 2023-12-19 DIAGNOSIS — G4733 Obstructive sleep apnea (adult) (pediatric): Secondary | ICD-10-CM

## 2023-12-19 DIAGNOSIS — R7301 Impaired fasting glucose: Secondary | ICD-10-CM | POA: Diagnosis not present

## 2023-12-19 DIAGNOSIS — I10 Essential (primary) hypertension: Secondary | ICD-10-CM

## 2023-12-19 DIAGNOSIS — G5793 Unspecified mononeuropathy of bilateral lower limbs: Secondary | ICD-10-CM

## 2023-12-19 DIAGNOSIS — J309 Allergic rhinitis, unspecified: Secondary | ICD-10-CM

## 2023-12-19 LAB — POCT GLYCOSYLATED HEMOGLOBIN (HGB A1C): Hemoglobin A1C: 5.8 % — AB (ref 4.0–5.6)

## 2023-12-19 MED ORDER — PROMETHAZINE HCL 12.5 MG PO TABS: 12.5000 mg | ORAL_TABLET | ORAL | 1 refills | Status: AC | PRN

## 2023-12-19 MED ORDER — ALBUTEROL SULFATE HFA 108 (90 BASE) MCG/ACT IN AERS: 1.0000 | INHALATION_SPRAY | RESPIRATORY_TRACT | 2 refills | Status: AC | PRN

## 2023-12-19 MED ORDER — MELOXICAM 15 MG PO TABS: 15.0000 mg | ORAL_TABLET | Freq: Every day | ORAL | 3 refills | Status: DC | PRN

## 2023-12-19 MED ORDER — STIOLTO RESPIMAT 2.5-2.5 MCG/ACT IN AERS: 2.0000 | INHALATION_SPRAY | Freq: Every morning | RESPIRATORY_TRACT | 5 refills | Status: AC

## 2023-12-19 NOTE — Progress Notes (Unsigned)
 Established Patient Office Visit  Patient ID: Tina Navarro, female    DOB: [birth year]  Age: 63 y.o. MRN: [id number] PCP: Alvan Dorothyann BIRCH, MD  Chief Complaint  Patient presents with   Hypertension    Subjective:     HPI  Discussed the use of AI scribe software for clinical note transcription with the patient, who gave verbal consent to proceed.  History of Present Illness Tina Navarro is a 63 year old female who presents with fatigue, headaches, and ear ringing.  Fatigue and headache - Persistent fatigue and headaches, particularly noticeable during seasonal changes and shifts in barometric pressure - Headaches are sometimes alleviated by Sudafed as needed; Zyrtec taken daily - No significant changes in blood pressure - No ear pain or scratchy throat  Tinnitus - Intermittent ear ringing, associated with stress - Ringing is not constant but can become bothersome - Manages symptoms with white noise or music - No sneezing or nasal drainage  Dyspnea and chest sensations - Shortness of breath, believed to be related to stress and possibly overeating - Uses two inhalers (one daily, one rescue) without symptomatic relief - Sensation of a knot in the chest when stressed - No chest pain - No shortness of breath unrelated to stress  Glycemic control and appetite changes - A1c increased from 5.6 to 5.8 - Increased appetite attributed to Lyrica  use - Actively managing diet despite medication side effects  Medication side effects and adjustments - Currently reducing Lyrica  dosage to 50 mg due to side effects including feeling 'weird' and unfocused - Has discontinued atorvastatin  80 mg  Sleep apnea management - Awaiting CPAP machine, which was ordered in July but not yet received   {History (Optional):23778}  ROS    Objective:     LMP  (LMP Unknown)  {Vitals History (Optional):23777}  Physical Exam Vitals and nursing note reviewed.  Constitutional:       Appearance: Normal appearance.  HENT:     Head: Normocephalic and atraumatic.     Right Ear: Tympanic membrane, ear canal and external ear normal.     Left Ear: Tympanic membrane, ear canal and external ear normal.  Eyes:     Conjunctiva/sclera: Conjunctivae normal.  Cardiovascular:     Rate and Rhythm: Normal rate and regular rhythm.  Pulmonary:     Effort: Pulmonary effort is normal.     Breath sounds: Normal breath sounds.  Musculoskeletal:     Cervical back: No tenderness.  Lymphadenopathy:     Cervical: No cervical adenopathy.  Skin:    General: Skin is warm and dry.  Neurological:     Mental Status: She is alert.  Psychiatric:        Mood and Affect: Mood normal.     {PhysExam Abridge (Optional):210964309} Results for orders placed or performed in visit on 12/19/23  POCT HgB A1C  Result Value Ref Range   Hemoglobin A1C 5.8 (A) 4.0 - 5.6 %   HbA1c POC (<> result, manual entry)     HbA1c, POC (prediabetic range)     HbA1c, POC (controlled diabetic range)      {Labs (Optional):23779}  The ASCVD Risk score (Arnett DK, et al., 2019) failed to calculate for the following reasons:   The valid total cholesterol range is 130 to 320 mg/dL    Assessment & Plan:   Problem List Items Addressed This Visit       Cardiovascular and Mediastinum   Essential hypertension   Relevant Orders   CMP14+EGFR  Respiratory   OSA (obstructive sleep apnea)     Digestive   Irritable bowel syndrome   Relevant Medications   promethazine  (PHENERGAN ) 12.5 MG tablet     Endocrine   IFG (impaired fasting glucose) - Primary   Relevant Orders   POCT HgB A1C (Completed)   CMP14+EGFR     Musculoskeletal and Integument   Lumbar degenerative disc disease   Relevant Medications   meloxicam  (MOBIC ) 15 MG tablet   Other Visit Diagnoses       Encounter for immunization       Relevant Orders   Pneumococcal conjugate vaccine 20-valent (Completed)     Pulmonary emphysema (HCC)        Relevant Medications   albuterol  (VENTOLIN  HFA) 108 (90 Base) MCG/ACT inhaler   promethazine  (PHENERGAN ) 12.5 MG tablet   Tiotropium Bromide-Olodaterol (STIOLTO RESPIMAT ) 2.5-2.5 MCG/ACT AERS       Assessment and Plan Assessment & Plan Obstructive sleep apnea CPAP machine not received despite order placed in July. - Confirm CPAP order in portal. - Switch supplier if no response. - Send MyChart message if no contact in two weeks.  Polyneuropathy involving both lower extremities Variable pain with Lyrica  causing increased appetite and side effects. Dosage adjustment needed for balance. - Consider Lyrica  dosage adjustment. - Discuss dosage adjustments with her.  Essential hypertension Blood pressure well-controlled at home. Stress may link to headaches, shortness of breath, and ear ringing. - Monitor blood pressure, especially during ear ringing episodes.  Tinnitus Intermittent ear ringing related to stress. Managed with white noise and distraction. - Consider ENT referral if bothersome. - Use white noise or distraction techniques.  Impaired fasting glucose (prediabetes) A1c increased from 5.6 to 5.8, remains in prediabetes range. Medication may affect glucose levels. - Continue monitoring A1c levels.  Allergic rhinitis Seasonal headaches with no significant allergy symptoms currently. - Continue Zyrtec daily. - Use Sudafed as needed.    Return in about 6 months (around 06/18/2024) for Hypertension, Pre-diabetes.    Dorothyann Byars, MD [facility] at [facility]

## 2023-12-20 ENCOUNTER — Ambulatory Visit: Payer: Self-pay | Admitting: Family Medicine

## 2023-12-20 ENCOUNTER — Encounter: Payer: Self-pay | Admitting: Family Medicine

## 2023-12-20 DIAGNOSIS — J309 Allergic rhinitis, unspecified: Secondary | ICD-10-CM | POA: Insufficient documentation

## 2023-12-20 LAB — CMP14+EGFR
ALT: 17 IU/L (ref 0–32)
AST: 22 IU/L (ref 0–40)
Albumin: 4.2 g/dL (ref 3.9–4.9)
Alkaline Phosphatase: 115 IU/L (ref 49–135)
BUN/Creatinine Ratio: 25 (ref 12–28)
BUN: 17 mg/dL (ref 8–27)
Bilirubin Total: 0.2 mg/dL (ref 0.0–1.2)
CO2: 21 mmol/L (ref 20–29)
Calcium: 9.2 mg/dL (ref 8.7–10.3)
Chloride: 106 mmol/L (ref 96–106)
Creatinine, Ser: 0.69 mg/dL (ref 0.57–1.00)
Globulin, Total: 2.2 g/dL (ref 1.5–4.5)
Glucose: 92 mg/dL (ref 70–99)
Potassium: 4.1 mmol/L (ref 3.5–5.2)
Sodium: 144 mmol/L (ref 134–144)
Total Protein: 6.4 g/dL (ref 6.0–8.5)
eGFR: 97 mL/min/1.73 (ref >59)

## 2023-12-20 NOTE — Assessment & Plan Note (Signed)
 Polyneuropathy involving both lower extremities Variable pain with Lyrica  causing increased appetite and side effects. Dosage adjustment needed for balance. - Consider Lyrica  dosage adjustment. - Discuss dosage adjustments with her.

## 2023-12-20 NOTE — Progress Notes (Signed)
 Alk phos is back to normal!!!!!!

## 2023-12-20 NOTE — Assessment & Plan Note (Signed)
 Obstructive sleep apnea CPAP machine not received despite order placed in July. - Confirm CPAP order in portal. - Switch supplier if no response. - Send MyChart message if no contact in two weeks.

## 2023-12-20 NOTE — Assessment & Plan Note (Signed)
 Essential hypertension Blood pressure well-controlled at home. Stress may link to headaches, shortness of breath, and ear ringing. - Monitor blood pressure, especially during ear ringing episodes.

## 2023-12-20 NOTE — Assessment & Plan Note (Signed)
 Impaired fasting glucose (prediabetes) A1c increased from 5.6 to 5.8, remains in prediabetes range. Medication may affect glucose levels. - Continue monitoring A1c levels.

## 2023-12-20 NOTE — Assessment & Plan Note (Signed)
 Allergic rhinitis Seasonal headaches with no significant allergy symptoms currently. - Continue Zyrtec daily. - Use Sudafed as needed.

## 2023-12-23 ENCOUNTER — Other Ambulatory Visit: Payer: Self-pay | Admitting: Family Medicine

## 2023-12-23 DIAGNOSIS — M5136 Other intervertebral disc degeneration, lumbar region with discogenic back pain only: Secondary | ICD-10-CM

## 2023-12-25 ENCOUNTER — Telehealth: Payer: Self-pay | Admitting: *Deleted

## 2023-12-25 NOTE — Telephone Encounter (Signed)
 Called pt and LVM asking if she has been contacted regarding her CPAP. Asked that she either call or send mychart to update

## 2024-01-07 ENCOUNTER — Telehealth: Payer: Self-pay | Admitting: Family Medicine

## 2024-01-07 NOTE — Telephone Encounter (Signed)
 CPAP machine orders, Sleep study reports, clinical notes, demographics, and copies of insurance cards have been faxed to [facility] at [phone] on 12/19/2023. Office will contact patient to complete online registration in order to ship home sleep study equipment.   Spoke with [facility] services), CPAP was not approved due to the order being missing. Was able to guide Tina Navarro to original order sent with the fax. Tina Navarro states orders will be resubmitted for approval.

## 2024-01-09 DIAGNOSIS — K581 Irritable bowel syndrome with constipation: Secondary | ICD-10-CM | POA: Diagnosis not present

## 2024-01-09 DIAGNOSIS — R1013 Epigastric pain: Secondary | ICD-10-CM | POA: Diagnosis not present

## 2024-01-09 DIAGNOSIS — K219 Gastro-esophageal reflux disease without esophagitis: Secondary | ICD-10-CM | POA: Diagnosis not present

## 2024-01-09 DIAGNOSIS — K5790 Diverticulosis of intestine, part unspecified, without perforation or abscess without bleeding: Secondary | ICD-10-CM | POA: Diagnosis not present

## 2024-02-04 ENCOUNTER — Other Ambulatory Visit: Payer: Self-pay | Admitting: Surgery

## 2024-02-04 DIAGNOSIS — I10 Essential (primary) hypertension: Secondary | ICD-10-CM | POA: Diagnosis not present

## 2024-02-04 DIAGNOSIS — E7800 Pure hypercholesterolemia, unspecified: Secondary | ICD-10-CM | POA: Diagnosis not present

## 2024-02-04 DIAGNOSIS — K3189 Other diseases of stomach and duodenum: Secondary | ICD-10-CM | POA: Diagnosis not present

## 2024-02-04 DIAGNOSIS — I6523 Occlusion and stenosis of bilateral carotid arteries: Secondary | ICD-10-CM

## 2024-02-04 DIAGNOSIS — I739 Peripheral vascular disease, unspecified: Secondary | ICD-10-CM

## 2024-02-04 DIAGNOSIS — K449 Diaphragmatic hernia without obstruction or gangrene: Secondary | ICD-10-CM | POA: Diagnosis not present

## 2024-02-04 DIAGNOSIS — I70213 Atherosclerosis of native arteries of extremities with intermittent claudication, bilateral legs: Secondary | ICD-10-CM

## 2024-02-11 ENCOUNTER — Encounter: Payer: Self-pay | Admitting: Family Medicine

## 2024-02-11 ENCOUNTER — Ambulatory Visit: Payer: Self-pay

## 2024-02-11 ENCOUNTER — Ambulatory Visit (INDEPENDENT_AMBULATORY_CARE_PROVIDER_SITE_OTHER): Admitting: Family Medicine

## 2024-02-11 VITALS — BP 108/67 | HR 96 | Temp 98.1°F | Ht 65.0 in | Wt 177.0 lb

## 2024-02-11 DIAGNOSIS — J40 Bronchitis, not specified as acute or chronic: Secondary | ICD-10-CM | POA: Insufficient documentation

## 2024-02-11 MED ORDER — PREDNISONE 20 MG PO TABS: 20.0000 mg | ORAL_TABLET | Freq: Two times a day (BID) | ORAL | 0 refills | Status: AC

## 2024-02-11 MED ORDER — GUAIFENESIN-CODEINE 100-10 MG/5ML PO SOLN: 5.0000 mL | Freq: Three times a day (TID) | ORAL | 0 refills | Status: DC | PRN

## 2024-02-11 NOTE — Progress Notes (Signed)
 Tina Navarro - 63 y.o. female MRN [id number]  Date of birth: 06-22-1960  Subjective Chief Complaint  Patient presents with   Sore Throat   Sinusitis    HPI Tina Navarro is a 63 y.o. female here today with complaint of cold symptoms. She has cough, congestion, post nasal drainage, ear pain and sinus pain.  Symptoms started about 4-5 days ago.  Cough is productive of yellow/brown mucus.  Occasional wheezing.   Denies fevers/chills.  Has not had dyspnea or wheezing.  So far she has tried dayquil and nyquil with limited improvement.  She is pushing fluids.   ROS:  A comprehensive ROS was completed and negative except as noted per HPI   Allergies[1]  Past Medical History:  Diagnosis Date   Anxiety    Basilar artery aneurysm    s/p stent supported coil embolization of basilar aneurysm 12/02/20   COVID-19 10/16/2022   Dyspnea    Emphysematous COPD (HCC) 04/23/2017   GERD (gastroesophageal reflux disease)    Hyperlipidemia    Hypertension    Peripheral arterial disease    Thyroid  nodule 04/23/2017    Past Surgical History:  Procedure Laterality Date   ABDOMINAL AORTOGRAM N/A 07/16/2017   Procedure: ABDOMINAL AORTOGRAM;  Surgeon: Serene Gaile ORN, MD;  Location: [facility];  Service: Cardiovascular;  Laterality: N/A;   ABDOMINAL AORTOGRAM W/LOWER EXTREMITY Bilateral 12/02/2018   Procedure: ABDOMINAL AORTOGRAM W/LOWER EXTREMITY;  Surgeon: Serene Gaile ORN, MD;  Location: [facility];  Service: Cardiovascular;  Laterality: Bilateral;   AORTIC ARCH ANGIOGRAPHY N/A 12/02/2018   Procedure: AORTIC ARCH ANGIOGRAPHY;  Surgeon: Serene Gaile ORN, MD;  Location: [facility];  Service: Cardiovascular;  Laterality: N/A;   basilaraneurysm repair  11/2020   CAROTID-SUBCLAVIAN BYPASS GRAFT Left 02/22/2021   Procedure: LEFT CAROTID-SUBCLAVIAN BYPASS USING HEAMSHIELD GOLD X30MM;  Surgeon: Serene Gaile ORN, MD;  Location: [facility] OR;  Service: Vascular;  Laterality: Left;    ENDOMETRIAL BIOPSY  2005   FEMORAL ARTERY STENT Bilateral 2008   IR 3D INDEPENDENT WKST  12/02/2020   IR ANGIO INTRA EXTRACRAN SEL INTERNAL CAROTID UNI R MOD SED  11/08/2020   IR ANGIO VERTEBRAL SEL SUBCLAVIAN INNOMINATE UNI R MOD SED  11/08/2020   IR ANGIO VERTEBRAL SEL VERTEBRAL BILAT MOD SED  12/02/2020   IR ANGIO VERTEBRAL SEL VERTEBRAL UNI R MOD SED  06/16/2021   IR ANGIOGRAM FOLLOW UP STUDY  12/02/2020   IR ANGIOGRAM FOLLOW UP STUDY  12/02/2020   IR ANGIOGRAM FOLLOW UP STUDY  12/02/2020   IR ANGIOGRAM FOLLOW UP STUDY  12/02/2020   IR TRANSCATH/EMBOLIZ  12/02/2020   IR US  GUIDE VASC ACCESS RIGHT  11/08/2020   IR US  GUIDE VASC ACCESS RIGHT  12/02/2020   IR US  GUIDE VASC ACCESS RIGHT  06/16/2021   IR US  GUIDE VASC ACCESS RIGHT  06/16/2021   LOWER EXTREMITY ANGIOGRAPHY Bilateral 07/16/2017   Procedure: Lower Extremity Angiography;  Surgeon: Serene Gaile ORN, MD;  Location: [facility];  Service: Cardiovascular;  Laterality: Bilateral;   NASAL SINUS SURGERY     PERIPHERAL VASCULAR BALLOON ANGIOPLASTY Bilateral 07/16/2017   Procedure: PERIPHERAL VASCULAR BALLOON ANGIOPLASTY;  Surgeon: Serene Gaile ORN, MD;  Location: [facility];  Service: Cardiovascular;  Laterality: Bilateral;  iliacs   PERIPHERAL VASCULAR INTERVENTION Bilateral 12/02/2018   Procedure: PERIPHERAL VASCULAR INTERVENTION;  Surgeon: Serene Gaile ORN, MD;  Location: [facility];  Service: Cardiovascular;  Laterality: Bilateral;   RADIOLOGY WITH ANESTHESIA  N/A 12/02/2020   Procedure: Arteriogram, Surpass embolization of basilar aneurysm;  Surgeon: Lanis Pupa, MD;  Location: [facility] OR;  Service: Radiology;  Laterality: N/A;   TONSILLECTOMY     TUBAL LIGATION     VASCULAR SURGERY      Social History   Socioeconomic History   Marital status: Married    Spouse name: Not on file   Number of children: 1   Years of education: Not on file   Highest education level: Associate degree: academic program   Occupational History   Not on file  Tobacco Use   Smoking status: Former    Current packs/day: 0.00    Average packs/day: 0.5 packs/day for 35.0 years (17.5 ttl pk-yrs)    Types: Cigarettes    Start date: 12/19/1982    Quit date: 12/18/2017    Years since quitting: 6.1   Smokeless tobacco: Never   Tobacco comments:    As per pt - smoking 1/2 pk daily  Vaping Use   Vaping status: Never Used  Substance and Sexual Activity   Alcohol use: No   Drug use: No   Sexual activity: Yes  Other Topics Concern   Not on file  Social History Narrative   Not on file   Social Drivers of Health   Tobacco Use: Medium Risk (02/11/2024)   Patient History    Smoking Tobacco Use: Former    Smokeless Tobacco Use: Never    Passive Exposure: Not on Actuary Strain: Low Risk (12/18/2023)   Overall Financial Resource Strain (CARDIA)    Difficulty of Paying Living Expenses: Not hard at all  Food Insecurity: No Food Insecurity (12/18/2023)   Epic    Worried About Radiation Protection Practitioner of Food in the Last Year: Never true    Ran Out of Food in the Last Year: Never true  Transportation Needs: No Transportation Needs (12/18/2023)   Epic    Lack of Transportation (Medical): No    Lack of Transportation (Non-Medical): No  Physical Activity: Insufficiently Active (12/18/2023)   Exercise Vital Sign    Days of Exercise per Week: 1 day    Minutes of Exercise per Session: 10 min  Stress: Stress Concern Present (12/18/2023)   [employer] of Occupational Health - Occupational Stress Questionnaire    Feeling of Stress: To some extent  Social Connections: Unknown (12/18/2023)   Social Connection and Isolation Panel    Frequency of Communication with Friends and Family: Twice a week    Frequency of Social Gatherings with Friends and Family: Patient declined    Attends Religious Services: Never    Database Administrator or Organizations: Yes    Attends Engineer, Structural: More than 4  times per year    Marital Status: Married  Depression (PHQ2-9): Low Risk (12/19/2023)   Depression (PHQ2-9)    PHQ-2 Score: 3  Alcohol Screen: Not on file  Housing: Low Risk (12/18/2023)   Epic    Unable to Pay for Housing in the Last Year: No    Number of Times Moved in the Last Year: 0    Homeless in the Last Year: No  Utilities: Not At Risk (09/16/2023)   Received from [facility]    In the past 12 months has the electric, gas, oil, or water company threatened to shut off services in your home?: No  Health Literacy: Not on file    Family History  Problem Relation Age of Onset  Hypertension Mother    Hypertension Father    Heart disease Father    Peripheral vascular disease Father    Alcohol abuse Father    Fibroids Sister    Cancer Maternal Grandmother        BREAST   Stroke Maternal Grandmother        CEBERAL ATHEROSCLEROSIS   Cancer - Ovarian Cousin     Health Maintenance  Topic Date Due   Zoster Vaccines- Shingrix (1 of 2) Never done   DTaP/Tdap/Td (2 - Td or Tdap) 12/12/2015   COVID-19 Vaccine (7 - Moderna risk 2025-26 season) 05/25/2024   Mammogram  09/23/2025   Cervical Cancer Screening (HPV/Pap Cotest)  09/06/2026   Colonoscopy  08/20/2032   Pneumococcal Vaccine: 50+ Years  Completed   Influenza Vaccine  Completed   Hepatitis C Screening  Completed   HIV Screening  Completed   Hepatitis B Vaccines 19-59 Average Risk  Aged Out   HPV VACCINES  Aged Out   Meningococcal B Vaccine  Aged Out     ----------------------------------------------------------------------------------------------------------------------------------------------------------------------------------------------------------------- Physical Exam BP 110/67 (BP Location: Left Arm, Patient Position: Sitting, Cuff Size: Normal)   Pulse 96   Temp 98.1 F (36.7 C) (Oral)   Ht 5' 5 (1.651 m)   Wt 177 lb (80.3 kg)   LMP  (LMP Unknown)   SpO2 98%   BMI 29.45 kg/m   Physical  Exam Constitutional:      Appearance: Normal appearance.  HENT:     Head: Normocephalic and atraumatic.  Eyes:     General: No scleral icterus. Cardiovascular:     Rate and Rhythm: Normal rate and regular rhythm.  Pulmonary:     Effort: Pulmonary effort is normal.     Breath sounds: Normal breath sounds.  Musculoskeletal:     Cervical back: Neck supple.  Neurological:     Mental Status: She is alert.  Psychiatric:        Mood and Affect: Mood normal.        Behavior: Behavior normal.     ------------------------------------------------------------------------------------------------------------------------------------------------------------------------------------------------------------------- Assessment and Plan  Bronchitis Continue with supportive care, increased fluids, OTC medications as needed.  Adding prednisone  burst as well as robitussin AC cough syrup.  Red flags reviewed.  F/u if not improving.     Meds ordered this encounter  Medications   predniSONE  (DELTASONE ) 20 MG tablet    Sig: Take 1 tablet (20 mg total) by mouth 2 (two) times daily with a meal for 5 days.    Dispense:  10 tablet    Refill:  0   guaiFENesin -codeine  100-10 MG/5ML syrup    Sig: Take 5 mLs by mouth 3 (three) times daily as needed for cough.    Dispense:  240 mL    Refill:  0    No follow-ups on file.        [1]  Allergies Allergen Reactions   Doxycycline Hives, Rash and Other (See Comments)    Pain, photosensitive

## 2024-02-11 NOTE — Telephone Encounter (Signed)
 Patient shows scheduled today 02/11/2024 @ 2:10pm with Dr. Alvia

## 2024-02-11 NOTE — Patient Instructions (Signed)
 Acute Bronchitis, Adult  Acute bronchitis is when air tubes in the lungs (bronchi) suddenly get swollen. The condition can make it hard for you to breathe. In adults, acute bronchitis usually goes away within 2 weeks. A cough caused by bronchitis may last up to 3 weeks. Smoking, allergies, and asthma can make the condition worse. What are the causes? Germs that cause cold and flu (viruses). The most common cause of this condition is the virus that causes the common cold. Bacteria. Substances that bother (irritate) the lungs, including: Smoke from cigarettes and other types of tobacco. Dust and pollen. Fumes from chemicals, gases, or burned fuel. Indoor or outdoor air pollution. What increases the risk? A weak body's defense system. This is also called the immune system. Any condition that affects your lungs and breathing, such as asthma. What are the signs or symptoms? A cough. Coughing up clear, yellow, or green mucus. Making high-pitched whistling sounds when you breathe, most often when you breathe out (wheezing). Runny or stuffy nose. Having too much mucus in your lungs (chest congestion). Shortness of breath. Body aches. A sore throat. How is this treated? Acute bronchitis may go away over time without treatment. Your doctor may tell you to: Drink more fluids. This will help thin your mucus so it is easier to cough up. Use a device that gets medicine into your lungs (inhaler). Use a vaporizer or a humidifier. These are machines that add water to the air. This helps with coughing and poor breathing. Take a medicine that thins mucus and helps clear it from your lungs. Take a medicine that prevents or stops coughing. It is not common to take an antibiotic medicine for this condition. Follow these instructions at home:  Take over-the-counter and prescription medicines only as told by your doctor. Use an inhaler, vaporizer, or humidifier as told by your doctor. Take two teaspoons  (10 mL) of honey at bedtime. This helps lessen your coughing at night. Drink enough fluid to keep your pee (urine) pale yellow. Do not smoke or use any products that contain nicotine or tobacco. If you need help quitting, ask your doctor. Get a lot of rest. Return to your normal activities when your doctor says that it is safe. Keep all follow-up visits. How is this prevented?  Wash your hands often with soap and water for at least 20 seconds. If you cannot use soap and water, use hand sanitizer. Avoid contact with people who have cold symptoms. Try not to touch your mouth, nose, or eyes with your hands. Avoid breathing in smoke or chemical fumes. Make sure to get the flu shot every year. Contact a doctor if: Your symptoms do not get better in 2 weeks. You have trouble coughing up the mucus. Your cough keeps you awake at night. You have a fever. Get help right away if: You cough up blood. You have chest pain. You have very bad shortness of breath. You faint or keep feeling like you are going to faint. You have a very bad headache. Your fever or chills get worse. These symptoms may be an emergency. Get help right away. Call your local emergency services (911 in the [place] Do not wait to see if the symptoms will go away. Do not drive yourself to the hospital. Summary Acute bronchitis is when air tubes in the lungs (bronchi) suddenly get swollen. In adults, acute bronchitis usually goes away within 2 weeks. Drink more fluids. This will help thin your mucus so it is easier  to cough up. Take over-the-counter and prescription medicines only as told by your doctor. Contact a doctor if your symptoms do not improve after 2 weeks of treatment. This information is not intended to replace advice given to you by your health care provider. Make sure you discuss any questions you have with your health care provider. Document Revised: 06/15/2020 Document Reviewed: 06/15/2020 Elsevier Patient  Education  2024 [employer].

## 2024-02-11 NOTE — Telephone Encounter (Signed)
 FYI Only or Action Required?: FYI only for provider: appointment scheduled on 02/11/24.  Patient was last seen in primary care on 12/19/2023 by Alvan Dorothyann BIRCH, MD.  Called Nurse Triage reporting Cough.  Symptoms began several days ago.  Interventions attempted: OTC medications: Dayquil and Rest, hydration, or home remedies.  Symptoms are: gradually worsening.  Triage Disposition: See HCP Within 4 Hours (Or PCP Triage)  Patient/caregiver understands and will follow disposition?: Yes, but will wait  Copied from CRM #[id number]. Topic: Clinical - Red Word Triage >> Feb 11, 2024  8:04 AM Antwanette L wrote: Red Word that prompted transfer to Nurse Triage: Patient reports chest discomfort accompanied by a cough producing brownish-yellow mucus , as well as persistent headaches Reason for Disposition  [1] MILD difficulty breathing (e.g., minimal/no SOB at rest, SOB with walking, pulse < 100) AND [2] still present when not coughing  Answer Assessment - Initial Assessment Questions 1. ONSET: When did the cough begin?      Friday 2. SEVERITY: How bad is the cough today?      Moderate-severe 3. SPUTUM: Describe the color of your sputum (e.g., none, dry cough; clear, white, yellow, green)     Brown and yellow 4. HEMOPTYSIS: Are you coughing up any blood? If Yes, ask: How much? (e.g., flecks, streaks, tablespoons, etc.)     Denies  5. DIFFICULTY BREATHING: Are you having difficulty breathing? If Yes, ask: How bad is it? (e.g., mild, moderate, severe)      Sob intermittent. Has albuterol  rescue inhaler available. 6. FEVER: Do you have a fever? If Yes, ask: What is your temperature, how was it measured, and when did it start?     Denies  7. CARDIAC HISTORY: Do you have any history of heart disease? (e.g., heart attack, congestive heart failure)       8. LUNG HISTORY: Do you have any history of lung disease?  (e.g., pulmonary embolus, asthma, emphysema)      9. PE  RISK FACTORS: Do you have a history of blood clots? (or: recent major surgery, recent prolonged travel, bedridden)      10. OTHER SYMPTOMS: Do you have any other symptoms? (e.g., runny nose, wheezing, chest pain)       Headache, ear pain 11. PREGNANCY: Is there any chance you are pregnant? When was your last menstrual period?        12. TRAVEL: Have you traveled out of the country in the last month? (e.g., travel history, exposures)  Protocols used: Cough - Acute Productive-A-AH

## 2024-02-11 NOTE — Assessment & Plan Note (Signed)
 Continue with supportive care, increased fluids, OTC medications as needed.  Adding prednisone  burst as well as robitussin AC cough syrup.  Red flags reviewed.  F/u if not improving.

## 2024-02-18 ENCOUNTER — Ambulatory Visit: Payer: Self-pay

## 2024-02-18 NOTE — Telephone Encounter (Signed)
 FYI Only or Action Required?: FYI only for provider: appointment scheduled on 02/21/2024.  Patient was last seen in primary care on 02/11/2024 by Alvia Bring, DO.  Called Nurse Triage reporting Cough.  Symptoms began x couple of days.  Interventions attempted: Nothing.  Symptoms are: gradually worsening.  Triage Disposition: See PCP When Office is Open (Within 3 Days)  Patient/caregiver understands and will follow disposition?: Yes  Copied from CRM #[id number]. Topic: Clinical - Red Word Triage >> Feb 18, 2024  8:01 AM Berwyn MATSU wrote: Red Word that prompted transfer to Nurse Triage: worsening symptoms deeper in chest  Patient reports chest discomfort accompanied by a cough producing brownish-yellow mucus , as well as persistent headaches and body aches. Reason for Disposition  Cough has been present for > 3 weeks  Answer Assessment - Initial Assessment Questions 1. ONSET: When did the cough begin?      X couple day 2. SEVERITY: How bad is the cough today?      severe 3. SPUTUM: Describe the color of your sputum (e.g., none, dry cough; clear, white, yellow, green)     Yellow-brownissh 4. HEMOPTYSIS: Are you coughing up any blood? If Yes, ask: How much? (e.g., flecks, streaks, tablespoons, etc.)     no 5. DIFFICULTY BREATHING: Are you having difficulty breathing? If Yes, ask: How bad is it? (e.g., mild, moderate, severe)      Mild to moderate 6. FEVER: Do you have a fever? If Yes, ask: What is your temperature, how was it measured, and when did it start?     no 7. CARDIAC HISTORY: Do you have any history of heart disease? (e.g., heart attack, congestive heart failure)      na 8. LUNG HISTORY: Do you have any history of lung disease?  (e.g., pulmonary embolus, asthma, emphysema)     na 9. PE RISK FACTORS: Do you have a history of blood clots? (or: recent major surgery, recent prolonged travel, bedridden)     na 10. OTHER SYMPTOMS: Do you have any other  symptoms? (e.g., runny nose, wheezing, chest pain)       Chest congestion, body aches, chest discomfort related to cough 11. PREGNANCY: Is there any chance you are pregnant? When was your last menstrual period?       na 12. TRAVEL: Have you traveled out of the country in the last month? (e.g., travel history, exposures)       Na  Very difficult pt - did not want to answer questions, very argumentative - does not like the new system, just wanted to make appointment  Protocols used: Cough - Acute Productive-A-AH

## 2024-02-21 ENCOUNTER — Ambulatory Visit: Admitting: Medical-Surgical

## 2024-02-21 ENCOUNTER — Encounter: Payer: Self-pay | Admitting: Medical-Surgical

## 2024-02-21 VITALS — BP 119/67 | HR 85 | Temp 98.5°F | Resp 18 | Ht 65.0 in | Wt 178.1 lb

## 2024-02-21 DIAGNOSIS — J329 Chronic sinusitis, unspecified: Secondary | ICD-10-CM

## 2024-02-21 DIAGNOSIS — J40 Bronchitis, not specified as acute or chronic: Secondary | ICD-10-CM | POA: Diagnosis not present

## 2024-02-21 MED ORDER — HYDROCODONE BIT-HOMATROP MBR 5-1.5 MG/5ML PO SOLN: 5.0000 mL | Freq: Three times a day (TID) | ORAL | 0 refills | Status: AC | PRN

## 2024-02-21 MED ORDER — METHYLPREDNISOLONE 4 MG PO TBPK: ORAL_TABLET | ORAL | 0 refills | Status: AC

## 2024-02-21 MED ORDER — AMOXICILLIN-POT CLAVULANATE 875-125 MG PO TABS: 1.0000 | ORAL_TABLET | Freq: Two times a day (BID) | ORAL | 0 refills | Status: DC

## 2024-02-21 NOTE — Progress Notes (Signed)
 "       Established patient visit   History of Present Illness   Discussed the use of AI scribe software for clinical note transcription with the patient, who gave verbal consent to proceed.  History of Present Illness   Tina Navarro is a 63 year old female who presents with a persistent cough and chest discomfort.  Cough and respiratory symptoms - Severe cough persisting for three weeks - Associated with wheezing and postnasal drainage - Cough causes back pain and disrupts sleep - Leads to fatigue - Sore throat and sensation of something in the back of the throat - Burning chest discomfort accompanying cough - Intermittent ear pain and pruritus - Ear symptoms are improving - No fever - Alternating sensations of feeling cold and hot  Treatment response - DayQuil/NyQuil provided only temporary relief - Seen last week and was thought to have bronchitis; steroid burst for 5 days unhelpful - Robitussin with Codeine  was ineffective   Physical Exam   Physical Exam Vitals reviewed.  Constitutional:      General: She is not in acute distress.    Appearance: Normal appearance. She is ill-appearing.  HENT:     Head: Normocephalic and atraumatic.     Right Ear: Tympanic membrane, ear canal and external ear normal. There is no impacted cerumen.     Left Ear: Tympanic membrane, ear canal and external ear normal. There is no impacted cerumen.     Nose: Congestion and rhinorrhea present.     Right Sinus: Maxillary sinus tenderness and frontal sinus tenderness present.     Left Sinus: Maxillary sinus tenderness and frontal sinus tenderness present.     Mouth/Throat:     Mouth: Mucous membranes are moist.     Pharynx: Posterior oropharyngeal erythema present. No oropharyngeal exudate.  Eyes:     General:        Right eye: No discharge.        Left eye: No discharge.     Extraocular Movements: Extraocular movements intact.     Conjunctiva/sclera: Conjunctivae normal.     Pupils:  Pupils are equal, round, and reactive to light.  Cardiovascular:     Rate and Rhythm: Normal rate and regular rhythm.     Pulses: Normal pulses.     Heart sounds: Normal heart sounds. No murmur heard.    No friction rub. No gallop.  Pulmonary:     Effort: Pulmonary effort is normal. No respiratory distress.     Breath sounds: Normal breath sounds. No wheezing.  Skin:    General: Skin is warm and dry.  Neurological:     Mental Status: She is alert and oriented to person, place, and time.  Psychiatric:        Mood and Affect: Mood normal.        Behavior: Behavior normal.        Thought Content: Thought content normal.        Judgment: Judgment normal.    Assessment & Plan   Sinobronchitis Ill appearing patient with persistent symptoms for three weeks with wheezing, chest burning, frontal/maxillary sinus tenderness, and postnasal drainage. Previous steroids and Robitussin with codeine  provided limited relief. Initiated antibiotic therapy due to prolonged symptoms. - Prescribed Medrol  Dosepak. - Prescribed Augmentin  twice daily for ten days. - Prescribed hydrocodone  and homatropine for cough relief.     Follow up   Return if symptoms worsen or fail to improve. __________________________________ Zada FREDRIK Palin, DNP, APRN, FNP-BC [facility] [place] "

## 2024-03-08 ENCOUNTER — Other Ambulatory Visit: Payer: Self-pay | Admitting: Family Medicine

## 2024-03-08 DIAGNOSIS — M5136 Other intervertebral disc degeneration, lumbar region with discogenic back pain only: Secondary | ICD-10-CM

## 2024-03-10 ENCOUNTER — Ambulatory Visit: Payer: Self-pay | Admitting: Family Medicine

## 2024-03-10 NOTE — Progress Notes (Signed)
 Hi Tina Navarro, we got your faxed over CPAP report.  AHI looks perfect and you are using it for little over 7 hours most nights which looks great.

## 2024-03-14 ENCOUNTER — Other Ambulatory Visit: Payer: Self-pay | Admitting: Family Medicine

## 2024-03-16 ENCOUNTER — Ambulatory Visit (HOSPITAL_COMMUNITY)
Admission: RE | Admit: 2024-03-16 | Discharge: 2024-03-16 | Disposition: A | Discharge status: Home / Self Care | Source: Ambulatory Visit | Attending: Surgery | Admitting: Surgery

## 2024-03-16 ENCOUNTER — Ambulatory Visit: Admitting: Physician Assistant

## 2024-03-16 ENCOUNTER — Ambulatory Visit (HOSPITAL_BASED_OUTPATIENT_CLINIC_OR_DEPARTMENT_OTHER)
Admission: RE | Admit: 2024-03-16 | Discharge: 2024-03-16 | Disposition: A | Source: Ambulatory Visit | Attending: Surgery | Admitting: Surgery

## 2024-03-16 ENCOUNTER — Encounter: Payer: Self-pay | Admitting: Physician Assistant

## 2024-03-16 VITALS — BP 156/84 | HR 92 | Ht 65.0 in | Wt 175.0 lb

## 2024-03-16 DIAGNOSIS — I739 Peripheral vascular disease, unspecified: Secondary | ICD-10-CM

## 2024-03-16 DIAGNOSIS — I70213 Atherosclerosis of native arteries of extremities with intermittent claudication, bilateral legs: Secondary | ICD-10-CM | POA: Insufficient documentation

## 2024-03-16 DIAGNOSIS — I6523 Occlusion and stenosis of bilateral carotid arteries: Secondary | ICD-10-CM

## 2024-03-16 DIAGNOSIS — I708 Atherosclerosis of other arteries: Secondary | ICD-10-CM | POA: Diagnosis not present

## 2024-03-16 LAB — VAS US ABI WITH/WO TBI
Left ABI: 1.3
Right ABI: 1.4

## 2024-03-16 NOTE — Progress Notes (Unsigned)
 "   Office Note   History of Present Illness   Tina Navarro is a 64 y.o. (1960/03/21) female who presents for surveillance of PAD. They have a history of ***  The patient returns today for follow up. He/she denies any recent medical history changes. The patient also denies any claudication, rest pain, or tissue loss of the lower extremities.  Current Outpatient Medications  Medication Sig Dispense Refill   acetaminophen  (TYLENOL ) 500 MG tablet Take 1,000 mg by mouth every 6 (six) hours as needed for headache.     albuterol  (VENTOLIN  HFA) 108 (90 Base) MCG/ACT inhaler Inhale 1-2 puffs into the lungs every 4 (four) hours as needed for wheezing or shortness of breath. 1 each 2   AMBULATORY NON FORMULARY MEDICATION Medication Name: CPAP with mask, tubing, supplies, humidifier set to AutoPap 5 to 15 cm of water pressure.  Please fax us  download after 2 weeks.  DX: OSA, AHI 7 1 Units 1   amLODipine  (NORVASC ) 5 MG tablet Take 1 tablet (5 mg total) by mouth daily. 90 tablet 3   atorvastatin  (LIPITOR ) 80 MG tablet Take 1 tablet (80 mg total) by mouth at bedtime. 90 tablet 3   Cholecalciferol  (VITAMIN D3 PO) Take 1 tablet by mouth in the morning.     estradiol  (ESTRACE ) 0.1 MG/GM vaginal cream Place 1 g vaginally 2 (two) times a week.     losartan  (COZAAR ) 100 MG tablet Take 1 tablet (100 mg total) by mouth daily. 90 tablet 1   meloxicam  (MOBIC ) 15 MG tablet Take 1 tablet (15 mg total) by mouth daily as needed for pain. 30 tablet 3   omeprazole  (PRILOSEC) 40 MG capsule Take 1 capsule (40 mg total) by mouth daily. 90 capsule 3   pregabalin  (LYRICA ) 50 MG capsule Take 1 capsule (50 mg total) by mouth 3 (three) times daily. 90 capsule 1   promethazine  (PHENERGAN ) 12.5 MG tablet Take 1 tablet (12.5 mg total) by mouth every 4 (four) hours as needed for nausea or vomiting. 30 tablet 1   QC LO-DOSE ASPIRIN  81 MG tablet Take 1 tablet (81 mg total) by mouth daily. 90 tablet 3   Tiotropium Bromide-Olodaterol  (STIOLTO RESPIMAT ) 2.5-2.5 MCG/ACT AERS Inhale 2 puffs into the lungs in the morning. 12 g 5   vitamin B-12 (CYANOCOBALAMIN ) 1000 MCG tablet Take 1,000 mcg by mouth daily.      zoledronic  acid (ZOMETA ) 4 MG/5ML injection Inject 4 mg into the vein once.     ezetimibe  (ZETIA ) 10 MG tablet Take 1 tablet (10 mg total) by mouth daily. (Patient not taking: Reported on 03/16/2024) 90 tablet 3   HYDROcodone  bit-homatropine (HYCODAN) 5-1.5 MG/5ML syrup Take 5 mLs by mouth every 8 (eight) hours as needed for cough. 120 mL 0   methylPREDNISolone  (MEDROL  DOSEPAK) 4 MG TBPK tablet Take as directed. 21 tablet 0   No current facility-administered medications for this visit.    ***REVIEW OF SYSTEMS (negative unless checked):   Cardiac:  []  Chest pain or chest pressure? []  Shortness of breath upon activity? []  Shortness of breath when lying flat? []  Irregular heart rhythm?  Vascular:  []  Pain in calf, thigh, or hip brought on by walking? []  Pain in feet at night that wakes you up from your sleep? []  Blood clot in your veins? []  Leg swelling?  Pulmonary:  []  Oxygen at home? []  Productive cough? []  Wheezing?  Neurologic:  []  Sudden weakness in arms or legs? []  Sudden numbness in arms or legs? []   Sudden onset of difficult speaking or slurred speech? []  Temporary loss of vision in one eye? []  Problems with dizziness?  Gastrointestinal:  []  Blood in stool? []  Vomited blood?  Genitourinary:  []  Burning when urinating? []  Blood in urine?  Psychiatric:  []  Major depression  Hematologic:  []  Bleeding problems? []  Problems with blood clotting?  Dermatologic:  []  Rashes or ulcers?  Constitutional:  []  Fever or chills?  Ear/Nose/Throat:  []  Change in hearing? []  Nose bleeds? []  Sore throat?  Musculoskeletal:  []  Back pain? []  Joint pain? []  Muscle pain?   Physical Examination  *** Vitals:   03/16/24 1121 03/16/24 1123  BP: (!) 145/87 (!) 156/84  Pulse:  92  Weight: 175 lb  (79.4 kg)   Height: 5' 5 (1.651 m)    ***Body mass index is 29.12 kg/m.  General:  WDWN in NAD; vital signs documented above Gait: Not observed HENT: WNL, normocephalic Pulmonary: normal non-labored breathing , Cardiac: regular Abdomen: soft, NT, no masses Skin: without rashes Vascular Exam/Pulses: *** Extremities: {With/Without:20273} ischemic changes, {With/Without:20273} gangrene , {With/Without:20273} cellulitis; {With/Without:20273} open wounds;  Musculoskeletal: no muscle wasting or atrophy  Neurologic: A&O X 3;  No focal weakness or paresthesias are detected Psychiatric:  The pt has normal affect  Non-Invasive Vascular imaging   ABI (03/16/2024) R:  ABI: 1.4 (1.11),  PT: tri DP: tri TBI:  1.02 L:  ABI: 1.3 (1.03),  PT: tri DP: tri TBI: 1.06  Aortoiliac Duplex (03/16/2024) Patent common iliac artery stents without stenosis  Carotid Duplex (03/16/2024) Right ICA 40-59% stenosis. Left ICA 1-39% stenosis. Patent left carotid to left subclavian artery bypass  Medical Decision Making   Yvetta Drotar is a 64 y.o. female who presents for surveillance of PAD  Based on the patient's vascular studies, their ABIs are essentially unchanged since last visit. *** Arterial duplex *** The patient denies any claudication, rest pain, or tissue loss. They have palpable/nonpalpable pedal pulses with *** doppler signals They will continue their *** and follow up with our office in *** months/year with ABIs and ***   Ahmed Holster PA-C [facility] Office: [phone]  Clinic MD: ***  "

## 2024-06-18 ENCOUNTER — Ambulatory Visit: Admitting: Family Medicine
